# Patient Record
Sex: Female | Born: 1935 | Race: White | Hispanic: No | Marital: Married | Smoking: Never smoker
Health system: Southern US, Community
[De-identification: ages and names within clinical notes are randomized; demographics above are authoritative.]

## PROBLEM LIST (undated history)

## (undated) DIAGNOSIS — N950 Postmenopausal bleeding: Secondary | ICD-10-CM

## (undated) DIAGNOSIS — K5730 Diverticulosis of large intestine without perforation or abscess without bleeding: Secondary | ICD-10-CM

## (undated) DIAGNOSIS — D72119 Hypereosinophilic syndrome (hes), unspecified: Secondary | ICD-10-CM

## (undated) DIAGNOSIS — K529 Noninfective gastroenteritis and colitis, unspecified: Secondary | ICD-10-CM

## (undated) DIAGNOSIS — Z974 Presence of external hearing-aid: Secondary | ICD-10-CM

## (undated) DIAGNOSIS — Z853 Personal history of malignant neoplasm of breast: Secondary | ICD-10-CM

## (undated) DIAGNOSIS — C189 Malignant neoplasm of colon, unspecified: Secondary | ICD-10-CM

## (undated) DIAGNOSIS — K219 Gastro-esophageal reflux disease without esophagitis: Secondary | ICD-10-CM

## (undated) DIAGNOSIS — M810 Age-related osteoporosis without current pathological fracture: Secondary | ICD-10-CM

## (undated) DIAGNOSIS — D721 Eosinophilia: Secondary | ICD-10-CM

## (undated) HISTORY — DX: Personal history of malignant neoplasm of breast: Z85.3

## (undated) HISTORY — DX: Age-related osteoporosis without current pathological fracture: M81.0

## (undated) HISTORY — DX: Gastro-esophageal reflux disease without esophagitis: K21.9

## (undated) HISTORY — PX: CATARACT EXTRACTION W/ INTRAOCULAR LENS  IMPLANT, BILATERAL: SHX1307

## (undated) HISTORY — DX: Noninfective gastroenteritis and colitis, unspecified: K52.9

---
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
## 2001-11-09 ENCOUNTER — Encounter: Admission: RE | Admit: 2001-11-09 | Discharge: 2001-11-09 | Payer: Self-pay | Admitting: Internal Medicine

## 2002-10-30 ENCOUNTER — Other Ambulatory Visit: Admission: RE | Admit: 2002-10-30 | Discharge: 2002-10-30 | Payer: Self-pay | Admitting: Internal Medicine

## 2002-12-12 ENCOUNTER — Encounter: Admission: RE | Admit: 2002-12-12 | Discharge: 2002-12-12 | Payer: Self-pay | Admitting: Internal Medicine

## 2003-06-06 ENCOUNTER — Ambulatory Visit (HOSPITAL_COMMUNITY): Admission: RE | Admit: 2003-06-06 | Discharge: 2003-06-06 | Payer: Self-pay | Admitting: Gastroenterology

## 2004-02-20 ENCOUNTER — Ambulatory Visit (HOSPITAL_COMMUNITY): Admission: RE | Admit: 2004-02-20 | Discharge: 2004-02-20 | Payer: Self-pay | Admitting: Internal Medicine

## 2005-03-02 ENCOUNTER — Ambulatory Visit (HOSPITAL_COMMUNITY): Admission: RE | Admit: 2005-03-02 | Discharge: 2005-03-02 | Payer: Self-pay | Admitting: Internal Medicine

## 2006-03-16 ENCOUNTER — Other Ambulatory Visit: Admission: RE | Admit: 2006-03-16 | Discharge: 2006-03-16 | Payer: Self-pay | Admitting: Internal Medicine

## 2006-03-31 ENCOUNTER — Ambulatory Visit (HOSPITAL_COMMUNITY): Admission: RE | Admit: 2006-03-31 | Discharge: 2006-03-31 | Payer: Self-pay | Admitting: Internal Medicine

## 2007-04-13 ENCOUNTER — Ambulatory Visit (HOSPITAL_COMMUNITY): Admission: RE | Admit: 2007-04-13 | Discharge: 2007-04-13 | Payer: Self-pay | Admitting: Internal Medicine

## 2008-02-21 ENCOUNTER — Encounter: Admission: RE | Admit: 2008-02-21 | Discharge: 2008-02-21 | Payer: Self-pay | Admitting: Internal Medicine

## 2008-05-09 ENCOUNTER — Ambulatory Visit (HOSPITAL_COMMUNITY): Admission: RE | Admit: 2008-05-09 | Discharge: 2008-05-09 | Payer: Self-pay | Admitting: Internal Medicine

## 2008-08-07 ENCOUNTER — Encounter (INDEPENDENT_AMBULATORY_CARE_PROVIDER_SITE_OTHER): Payer: Self-pay | Admitting: *Deleted

## 2008-08-07 ENCOUNTER — Ambulatory Visit (HOSPITAL_COMMUNITY): Admission: RE | Admit: 2008-08-07 | Discharge: 2008-08-07 | Payer: Self-pay | Admitting: *Deleted

## 2009-05-15 ENCOUNTER — Ambulatory Visit (HOSPITAL_COMMUNITY): Admission: RE | Admit: 2009-05-15 | Discharge: 2009-05-15 | Payer: Self-pay | Admitting: Internal Medicine

## 2009-05-23 ENCOUNTER — Encounter: Admission: RE | Admit: 2009-05-23 | Discharge: 2009-05-23 | Payer: Self-pay | Admitting: Internal Medicine

## 2009-05-26 ENCOUNTER — Inpatient Hospital Stay (HOSPITAL_COMMUNITY): Admission: EM | Admit: 2009-05-26 | Discharge: 2009-05-27 | Payer: Self-pay | Admitting: Emergency Medicine

## 2009-05-26 ENCOUNTER — Encounter (INDEPENDENT_AMBULATORY_CARE_PROVIDER_SITE_OTHER): Payer: Self-pay | Admitting: Surgery

## 2009-05-26 HISTORY — PX: LAPAROSCOPIC CHOLECYSTECTOMY: SUR755

## 2009-06-20 ENCOUNTER — Ambulatory Visit: Admission: RE | Admit: 2009-06-20 | Discharge: 2009-06-20 | Payer: Self-pay | Admitting: Internal Medicine

## 2009-11-20 ENCOUNTER — Encounter: Admission: RE | Admit: 2009-11-20 | Discharge: 2009-11-20 | Payer: Self-pay | Admitting: Internal Medicine

## 2009-11-25 ENCOUNTER — Encounter: Admission: RE | Admit: 2009-11-25 | Discharge: 2009-11-25 | Payer: Self-pay | Admitting: Internal Medicine

## 2009-11-26 ENCOUNTER — Encounter: Admission: RE | Admit: 2009-11-26 | Discharge: 2009-11-26 | Payer: Self-pay | Admitting: Internal Medicine

## 2009-11-28 ENCOUNTER — Encounter: Admission: RE | Admit: 2009-11-28 | Discharge: 2009-11-28 | Payer: Self-pay | Admitting: Internal Medicine

## 2009-12-24 ENCOUNTER — Encounter: Admission: RE | Admit: 2009-12-24 | Discharge: 2009-12-24 | Payer: Self-pay | Admitting: Surgery

## 2009-12-24 ENCOUNTER — Ambulatory Visit (HOSPITAL_COMMUNITY): Admission: RE | Admit: 2009-12-24 | Discharge: 2009-12-24 | Payer: Self-pay | Admitting: Surgery

## 2009-12-24 HISTORY — PX: BREAST LUMPECTOMY: SHX2

## 2010-03-24 ENCOUNTER — Ambulatory Visit: Payer: Medicare Other | Attending: Radiation Oncology | Admitting: Radiation Oncology

## 2010-03-24 DIAGNOSIS — Z79899 Other long term (current) drug therapy: Secondary | ICD-10-CM | POA: Insufficient documentation

## 2010-03-24 DIAGNOSIS — D0590 Unspecified type of carcinoma in situ of unspecified breast: Secondary | ICD-10-CM | POA: Insufficient documentation

## 2010-03-25 ENCOUNTER — Encounter (HOSPITAL_BASED_OUTPATIENT_CLINIC_OR_DEPARTMENT_OTHER): Payer: Medicare Other | Admitting: Oncology

## 2010-03-25 ENCOUNTER — Encounter: Payer: Self-pay | Admitting: Oncology

## 2010-03-25 DIAGNOSIS — D0590 Unspecified type of carcinoma in situ of unspecified breast: Secondary | ICD-10-CM

## 2010-04-09 LAB — CBC
HCT: 41.2 % (ref 36.0–46.0)
MCH: 28.2 pg (ref 26.0–34.0)
MCHC: 33.3 g/dL (ref 30.0–36.0)
MCV: 84.9 fL (ref 78.0–100.0)
Platelets: 88 10*3/uL — ABNORMAL LOW (ref 150–400)
RDW: 14.8 % (ref 11.5–15.5)
WBC: 6 10*3/uL (ref 4.0–10.5)

## 2010-04-09 LAB — COMPREHENSIVE METABOLIC PANEL
Albumin: 4.3 g/dL (ref 3.5–5.2)
BUN: 11 mg/dL (ref 6–23)
Calcium: 9.9 mg/dL (ref 8.4–10.5)
Creatinine, Ser: 0.78 mg/dL (ref 0.4–1.2)
Glucose, Bld: 116 mg/dL — ABNORMAL HIGH (ref 70–99)
Total Protein: 7.2 g/dL (ref 6.0–8.3)

## 2010-04-09 LAB — DIFFERENTIAL
Basophils Relative: 0 % (ref 0–1)
Eosinophils Absolute: 0.1 10*3/uL (ref 0.0–0.7)
Lymphocytes Relative: 27 % (ref 12–46)
Monocytes Relative: 11 % (ref 3–12)
Neutro Abs: 3.6 10*3/uL (ref 1.7–7.7)
Neutrophils Relative %: 61 % (ref 43–77)

## 2010-04-15 LAB — LIPASE, BLOOD: Lipase: 33 U/L (ref 11–59)

## 2010-04-15 LAB — CBC
HCT: 41.4 % (ref 36.0–46.0)
Hemoglobin: 13.9 g/dL (ref 12.0–15.0)
MCHC: 33.5 g/dL (ref 30.0–36.0)
RBC: 4.71 MIL/uL (ref 3.87–5.11)
RDW: 14.7 % (ref 11.5–15.5)

## 2010-04-15 LAB — DIFFERENTIAL
Basophils Absolute: 0 10*3/uL (ref 0.0–0.1)
Eosinophils Absolute: 0 10*3/uL (ref 0.0–0.7)
Lymphs Abs: 1.6 10*3/uL (ref 0.7–4.0)
Monocytes Absolute: 0.6 10*3/uL (ref 0.1–1.0)
Neutrophils Relative %: 78 % — ABNORMAL HIGH (ref 43–77)

## 2010-04-15 LAB — COMPREHENSIVE METABOLIC PANEL
ALT: 19 U/L (ref 0–35)
Alkaline Phosphatase: 53 U/L (ref 39–117)
BUN: 17 mg/dL (ref 6–23)
CO2: 27 mEq/L (ref 19–32)
Calcium: 10 mg/dL (ref 8.4–10.5)
GFR calc non Af Amer: >60 mL/min (ref >60)
Glucose, Bld: 141 mg/dL — ABNORMAL HIGH (ref 70–99)
Potassium: 3.5 mEq/L (ref 3.5–5.1)
Sodium: 140 mEq/L (ref 135–145)
Total Protein: 7.3 g/dL (ref 6.0–8.3)

## 2010-04-15 LAB — URINE MICROSCOPIC-ADD ON

## 2010-04-15 LAB — URINALYSIS, ROUTINE W REFLEX MICROSCOPIC
Bilirubin Urine: NEGATIVE
Ketones, ur: NEGATIVE mg/dL
Nitrite: NEGATIVE
Protein, ur: NEGATIVE mg/dL
Urobilinogen, UA: 0.2 mg/dL (ref 0.0–1.0)

## 2010-04-25 ENCOUNTER — Other Ambulatory Visit: Payer: Self-pay | Admitting: Dermatology

## 2010-05-27 ENCOUNTER — Other Ambulatory Visit: Payer: Self-pay | Admitting: Oncology

## 2010-05-27 ENCOUNTER — Encounter (HOSPITAL_BASED_OUTPATIENT_CLINIC_OR_DEPARTMENT_OTHER): Payer: Medicare Other | Admitting: Oncology

## 2010-05-27 DIAGNOSIS — D0590 Unspecified type of carcinoma in situ of unspecified breast: Secondary | ICD-10-CM

## 2010-05-27 LAB — CBC WITH DIFFERENTIAL/PLATELET
Basophils Absolute: 0 10*3/uL (ref 0.0–0.1)
Eosinophils Absolute: 0 10*3/uL (ref 0.0–0.5)
HCT: 39.6 % (ref 34.8–46.6)
HGB: 13.5 g/dL (ref 11.6–15.9)
LYMPH%: 36.3 % (ref 14.0–49.7)
MONO#: 0.7 10*3/uL (ref 0.1–0.9)
NEUT#: 3 10*3/uL (ref 1.5–6.5)
Platelets: 93 10*3/uL — ABNORMAL LOW (ref 145–400)
RBC: 4.74 10*6/uL (ref 3.70–5.45)
WBC: 5.9 10*3/uL (ref 3.9–10.3)

## 2010-05-28 LAB — VITAMIN D 25 HYDROXY (VIT D DEFICIENCY, FRACTURES): Vit D, 25-Hydroxy: 61 ng/mL (ref 30–89)

## 2010-05-28 LAB — COMPREHENSIVE METABOLIC PANEL
ALT: 20 U/L (ref 0–35)
Alkaline Phosphatase: 59 U/L (ref 39–117)
CO2: 24 mEq/L (ref 19–32)
Creatinine, Ser: 0.82 mg/dL (ref 0.40–1.20)
Total Bilirubin: 0.5 mg/dL (ref 0.3–1.2)

## 2010-06-10 NOTE — Op Note (Signed)
NAME:  April [name], April [name] NO.:  [id number]   MEDICAL RECORD NO.:  [id number]          PATIENT TYPE:  AMB   LOCATION:  ENDO                         FACILITY:  [facility]   PHYSICIAN:  Georgiana Spinner, M.D.    DATE OF BIRTH:  02-11-1925   DATE OF PROCEDURE:  08/07/2008  DATE OF DISCHARGE:                               OPERATIVE REPORT   PROCEDURE:  Colonoscopy with polypectomy and biopsy.   INDICATIONS:  Colon polyps.   ANESTHESIA:  Fentanyl 75 mcg, Versed 7 mg.   PROCEDURE:  With the patient mildly sedated in the left lateral  decubitus position, the Pentax videoscopic pediatric colonoscope was  inserted the rectum and passed under direct vision with pressure  applied.  We reached the cecum identified by ileocecal valve and  appendiceal orifice, both of which were photographed.  From this point,  the colonoscope was slowly withdrawn taking circumferential views of  colonic mucosa, stopping at 40 cm from anal verge at which point we  encountered 2 polyps.  Both were photographed.  Both were removed using  snare cautery technique setting of 20/150 blended current.  Photographs  were taken post polypectomy of both sites which were clean.  Both polyps  were retrieved by suctioning through the endoscope into a tissue trap.  We then further stopped to photograph diverticula seen in the sigmoid  colon and in the rectum where a polyp was seen, photographed and removed  using hot biopsy forceps technique set with the same setting of 20/150  blended current.  The rectum otherwise appeared normal on direct and  showed hemorrhoids on retroflexed view.  The endoscope was straightened  and withdrawn.  The patient's vital signs and pulse oximeter remained  stable.  The patient tolerated the procedure well without apparent  complication.   FINDINGS:  Polyps at 40 cm from anal verge, some diverticulosis of  sigmoid colon and a rectal polyp removed.  Internal hemorrhoids were  noted as  well.   PLAN:  Await biopsy report.  The patient will call me for results and  follow-up with me as an outpatient.           ______________________________  Georgiana Spinner, M.D.     GMO/MEDQ  D:  08/07/2008  T:  08/07/2008  Job:  [id number]

## 2010-06-13 NOTE — Op Note (Signed)
NAME:  April [name], April [name]                          ACCOUNT NO.:  [id number]   MEDICAL RECORD NO.:  [id number]                   PATIENT TYPE:  AMB   LOCATION:  ENDO                                 FACILITY:  [facility]   PHYSICIAN:  Anselmo Rod, M.D.               DATE OF BIRTH:  Jun 11, 1925   DATE OF PROCEDURE:  06/06/2003  DATE OF DISCHARGE:                                 OPERATIVE REPORT   PROCEDURE PERFORMED:  Colonoscopy.   ENDOSCOPIST:  Charna Elizabeth, M.D.   INSTRUMENT USED:  Olympus video colonoscope.   INDICATIONS FOR PROCEDURE:  The patient is a 77 year old white female with a  history of thrombocytopenia and colonic polyps, undergoing screening  colonoscopy. Rule out recurrent colonic polyps.   PREPROCEDURE PREPARATION:  Informed consent was procured from the patient.  The patient was fasted for eight hours prior to the procedure and prepped  with a bottle of magnesium citrate and a gallon of GoLYTELY the night prior  to the procedure.   PREPROCEDURE PHYSICAL:  The patient had stable vital signs.  Neck supple.  Chest clear to auscultation.  S1 and S2 regular.  Abdomen soft with normal  bowel sounds.   DESCRIPTION OF PROCEDURE:  The patient was placed in left lateral decubitus  position and sedated with 50 mg of Demerol and 5 mg of Versed intravenously.  Once the patient was adequately sedated and maintained on low flow oxygen  and continuous cardiac monitoring, the Olympus video colonoscope was  advanced from the rectum to the cecum and terminal ileum without difficulty.  The patient had a fairly good prep.  The appendicular orifice and ileocecal  valve were clearly visualized and photographed.  The terminal ileum appeared  healthy and without lesions.  There were a few scattered diverticula seen in  the sigmoid colon, the rest of the exam was normal.  Retroflexion in the  rectum revealed no abnormalities.  The patient tolerated the procedure well  without immediate  complications.   IMPRESSION:  Normal colonoscopy up to the terminal ileum except for a few  easily sigmoid diverticula.   RECOMMENDATIONS:  1. Repeat colorectal cancer screening is recommended in the next five years     considering her personal history of colonic polyps.  2. Continue high fiber diet with liberal fluid intake.  3. Brochures on a high fiber diet and diverticula have been handed to the     patient for her education.  4. Outpatient followup as need arises in the future.                                               Anselmo Rod, M.D.    JNM/MEDQ  D:  06/06/2003  T:  06/06/2003  Job:  [id number]   cc:  Juline Patch, M.D.  [address]  [place], [place] [address]  Fax: [phone]

## 2010-06-25 ENCOUNTER — Other Ambulatory Visit: Payer: Self-pay | Admitting: Dermatology

## 2010-07-09 ENCOUNTER — Other Ambulatory Visit: Payer: Self-pay | Admitting: Oncology

## 2010-07-09 ENCOUNTER — Encounter (HOSPITAL_BASED_OUTPATIENT_CLINIC_OR_DEPARTMENT_OTHER): Payer: Medicare Other | Admitting: Oncology

## 2010-07-09 DIAGNOSIS — D0590 Unspecified type of carcinoma in situ of unspecified breast: Secondary | ICD-10-CM

## 2010-07-09 DIAGNOSIS — Z170 Estrogen receptor positive status [ER+]: Secondary | ICD-10-CM

## 2010-07-09 DIAGNOSIS — D696 Thrombocytopenia, unspecified: Secondary | ICD-10-CM

## 2010-07-09 LAB — CBC WITH DIFFERENTIAL/PLATELET
Basophils Absolute: 0 10*3/uL (ref 0.0–0.1)
HCT: 40.2 % (ref 34.8–46.6)
HGB: 13.6 g/dL (ref 11.6–15.9)
MCH: 28.3 pg (ref 25.1–34.0)
MONO#: 0.8 10*3/uL (ref 0.1–0.9)
NEUT%: 63.5 % (ref 38.4–76.8)
Platelets: 103 10*3/uL — ABNORMAL LOW (ref 145–400)
lymph#: 1.9 10*3/uL (ref 0.9–3.3)

## 2010-07-09 LAB — COMPREHENSIVE METABOLIC PANEL
BUN: 18 mg/dL (ref 6–23)
CO2: 28 mEq/L (ref 19–32)
Calcium: 9.5 mg/dL (ref 8.4–10.5)
Chloride: 104 mEq/L (ref 96–112)
Creatinine, Ser: 0.75 mg/dL (ref 0.50–1.10)

## 2010-07-10 ENCOUNTER — Other Ambulatory Visit: Payer: Self-pay | Admitting: Dermatology

## 2010-09-26 ENCOUNTER — Other Ambulatory Visit: Payer: Self-pay | Admitting: Internal Medicine

## 2010-09-26 DIAGNOSIS — Z853 Personal history of malignant neoplasm of breast: Secondary | ICD-10-CM

## 2010-09-26 DIAGNOSIS — Z9889 Other specified postprocedural states: Secondary | ICD-10-CM

## 2010-11-24 ENCOUNTER — Ambulatory Visit
Admission: RE | Admit: 2010-11-24 | Discharge: 2010-11-24 | Disposition: A | Discharge status: Home / Self Care | Payer: Medicare Other | Source: Ambulatory Visit | Attending: Internal Medicine | Admitting: Internal Medicine

## 2010-11-24 DIAGNOSIS — Z853 Personal history of malignant neoplasm of breast: Secondary | ICD-10-CM

## 2010-11-24 DIAGNOSIS — Z9889 Other specified postprocedural states: Secondary | ICD-10-CM

## 2010-11-28 ENCOUNTER — Encounter (INDEPENDENT_AMBULATORY_CARE_PROVIDER_SITE_OTHER): Payer: Self-pay | Admitting: General Surgery

## 2010-11-28 ENCOUNTER — Encounter (INDEPENDENT_AMBULATORY_CARE_PROVIDER_SITE_OTHER): Payer: Self-pay | Admitting: Surgery

## 2010-11-28 DIAGNOSIS — Z853 Personal history of malignant neoplasm of breast: Secondary | ICD-10-CM | POA: Insufficient documentation

## 2010-12-05 ENCOUNTER — Encounter (INDEPENDENT_AMBULATORY_CARE_PROVIDER_SITE_OTHER): Payer: Self-pay | Admitting: Surgery

## 2010-12-05 ENCOUNTER — Ambulatory Visit (INDEPENDENT_AMBULATORY_CARE_PROVIDER_SITE_OTHER): Payer: Medicare Other | Admitting: Surgery

## 2010-12-05 VITALS — BP 128/78 | HR 84 | Temp 97.4°F | Resp 16 | Ht 65.0 in | Wt 162.2 lb

## 2010-12-05 DIAGNOSIS — Z853 Personal history of malignant neoplasm of breast: Secondary | ICD-10-CM

## 2010-12-05 NOTE — Progress Notes (Addendum)
NAME: April [name]       DOB: 08/23/1925           DATE: 12/05/2010       MRN: [id number]   April [name] is a 77 y.o.Marland Kitchenfemale who presents for routine followup of her Right breast cancer, DCIS diagnosed in 2011 and treated with Lumpectomy. She has no problems or concerns on either side.  PFSH: She has had no significant changes since the last visit here.  ROS: There have been no significant changes since the last visit here  EXAM: General: The patient is alert, oriented, generally healty appearing, NAD. Mood and affect are normal.  Breasts:  Symmetric - no palpable mass on either side. No suggestion of recurrence or new problem. Normal exam  Lymphatics: She has no axillary or supraclavicular adenopathy on either side.  Extremities: Full ROM of the surgical side with no lymphedema noted.  Data Reviewed: Mammogram last month OK  Impression: Doing well, with no evidence of recurrent cancer or new cancer  Plan: Will continue to follow up on an annual basis here.

## 2010-12-24 ENCOUNTER — Telehealth: Payer: Self-pay | Admitting: Oncology

## 2010-12-24 ENCOUNTER — Other Ambulatory Visit: Payer: Self-pay | Admitting: Oncology

## 2010-12-24 ENCOUNTER — Ambulatory Visit (HOSPITAL_BASED_OUTPATIENT_CLINIC_OR_DEPARTMENT_OTHER): Payer: Medicare Other | Admitting: Oncology

## 2010-12-24 ENCOUNTER — Other Ambulatory Visit (HOSPITAL_BASED_OUTPATIENT_CLINIC_OR_DEPARTMENT_OTHER): Payer: Medicare Other

## 2010-12-24 VITALS — BP 149/74 | HR 77 | Temp 97.9°F | Ht 65.0 in | Wt 163.9 lb

## 2010-12-24 DIAGNOSIS — C50919 Malignant neoplasm of unspecified site of unspecified female breast: Secondary | ICD-10-CM

## 2010-12-24 DIAGNOSIS — D0590 Unspecified type of carcinoma in situ of unspecified breast: Secondary | ICD-10-CM

## 2010-12-24 DIAGNOSIS — Z170 Estrogen receptor positive status [ER+]: Secondary | ICD-10-CM

## 2010-12-24 LAB — CBC WITH DIFFERENTIAL/PLATELET
Basophils Absolute: 0 10*3/uL (ref 0.0–0.1)
EOS%: 0.4 % (ref 0.0–7.0)
Eosinophils Absolute: 0 10*3/uL (ref 0.0–0.5)
HGB: 13.7 g/dL (ref 11.6–15.9)
MONO#: 0.7 10*3/uL (ref 0.1–0.9)
NEUT#: 4.9 10*3/uL (ref 1.5–6.5)
RDW: 15.5 % — ABNORMAL HIGH (ref 11.2–14.5)
WBC: 8 10*3/uL (ref 3.9–10.3)
lymph#: 2.3 10*3/uL (ref 0.9–3.3)

## 2010-12-24 NOTE — Telephone Encounter (Signed)
Gv pt appt for [id number] 

## 2010-12-24 NOTE — Progress Notes (Signed)
OFFICE PROGRESS NOTE  CC: Dr. Cyndia Bent Dr. Royston Cowper, MD, MD [address] [facility] [address] [facility] [place] [address]   DIAGNOSIS: 77 year old female with DCIS status post lumpectomy in November 2011  PRIOR THERAPY:  #1 patient underwent a lumpectomy on 12/24/2009 that revealed a DCIS. The tumor was ER positive PR positive.  #2 patient was then begun on Aromasin 25 mg daily however she was discontinued 9 days later due to the fact that she developed  grade 3 arthralgias and myalgias.  CURRENT THERAPY: Observation  INTERVAL HISTORY: April [name] 77 y.o. female returns for followup visit. Her last visit with me was back in June 2012. Since being off of the Aromasin patient feels much better. Her hands and her feet did not hurt her back does not hurt at this time. She denies any fevers chills night sweats headaches no shortness of breath no chest pains no palpitation she has had a mammogram performed and was normal appearing no new changes. She has also been seen by Dr. Jamey Ripa in followup a few weeks ago.  MEDICAL HISTORY: Past Medical History  Diagnosis Date  . Hypertension   . History of breast cancer 2011    DCIS, Right breast    ALLERGIES:  is allergic to codeine; penicillins; and sulfa antibiotics.  MEDICATIONS:  Current Outpatient Prescriptions  Medication Sig Dispense Refill  . calcium carbonate (OS-CAL) 600 MG TABS Take 600 mg by mouth daily.        . cholecalciferol (VITAMIN D) 1000 UNITS tablet Take 2,000 Units by mouth daily.       [name] exemestane (AROMASIN) 25 MG tablet Take 25 mg by mouth daily after breakfast.        . fish oil-omega-3 fatty acids 1000 MG capsule Take 2 g by mouth daily.        . mometasone (NASONEX) 50 MCG/ACT nasal spray Place 2 sprays into the nose daily.        . solifenacin (VESICARE) 10 MG tablet Take 10 mg by mouth daily.        . vitamin E 400 UNIT capsule Take 400 Units by mouth daily.         . ASTEPRO 0.15 % SOLN         SURGICAL HISTORY:  Past Surgical History  Procedure Date  . Laparoscopic cholecystectomy 05/26/2009  . Breast lumpectomy 12/24/2009    Right, Dr Jamey Ripa    REVIEW OF SYSTEMS:  Constitutional: negative Eyes: negative Ears, nose, mouth, throat, and face: negative Respiratory: negative Cardiovascular: negative Gastrointestinal: negative Genitourinary:negative Musculoskeletal:positive for arthralgias, bone pain and stiff joints Neurological: negative   PHYSICAL EXAMINATION: General appearance: alert, cooperative and appears stated age Head: Normocephalic, without obvious abnormality, atraumatic Neck: no adenopathy, no carotid bruit, no JVD, supple, symmetrical, trachea midline and thyroid not enlarged, symmetric, no tenderness/mass/nodules Lymph nodes: Cervical, supraclavicular, and axillary nodes normal. Resp: clear to auscultation bilaterally and normal percussion bilaterally Back: symmetric, no curvature. ROM normal. No CVA tenderness. Extremities: extremities normal, atraumatic, no cyanosis or edema Neurologic: Alert and oriented X 3, normal strength and tone. Normal symmetric reflexes. Normal coordination and gait Sensory: normal Motor: grossly normal Reflexes: normal 2+ and symmetric Bilateral breast examination is performed. The right breast reveals a very well-healed surgical scar in the outer quadrant of the right breast. There are no nipple discharge no masses no chest skin changes. Left breast no masses nipple discharge or skin changes.  ECOG PERFORMANCE STATUS: 1 - Symptomatic  but completely ambulatory  Blood pressure 149/74, pulse 77, temperature 97.9 F (36.6 C), temperature source Oral, height 5\' 5"  (1.651 m), weight 163 lb 14.4 oz (74.345 kg).  LABORATORY DATA: Lab Results  Component Value Date   WBC 8.0 12/24/2010   HGB 13.7 12/24/2010   HCT 40.2 12/24/2010   MCV 82.2 12/24/2010   PLT 89* 12/24/2010      Chemistry        Component Value Date/Time   NA 140 07/09/2010 1459   K 3.9 07/09/2010 1459   CL 104 07/09/2010 1459   CO2 28 07/09/2010 1459   BUN 18 07/09/2010 1459   CREATININE 0.75 07/09/2010 1459      Component Value Date/Time   CALCIUM 9.5 07/09/2010 1459   ALKPHOS 80 07/09/2010 1459   AST 20 07/09/2010 1459   ALT 17 07/09/2010 1459   BILITOT 0.4 07/09/2010 1459       RADIOGRAPHIC STUDIES:  No results found.  ASSESSMENT: 77 year old female with  #1 DCIS of the right breast status post lumpectomy in November 2011. The tumor was ER/PR positive. Patient did not undergo radiation therapy but was begun on Aromasin 25 mg daily. Unfortunately she could not tolerate the Aromasin. This was therefore discontinued. She has been on observation only and is doing well.   PLAN: We will continue to observe the patient off all therapy. I do think the risks of her therapy far outweigh the benefits that she may potentially have of prevention of another breast cancer. She will continue to have mammograms done on a yearly basis. I will plan on seeing the patient on a yearly basis alternating with Dr. Jamey Ripa. He on his next visit will be seeing her in November 2013. I will see her in June 2013 and then thereafter on a yearly basis.   All questions were answered. The patient knows to call the clinic with any problems, questions or concerns. We can certainly see the patient much sooner if necessary.  I spent 20 minutes counseling the patient face to face. The total time spent in the appointment was 30 minutes.    [name], MD Medical/Oncology [facility] [phone] (beeper) [phone] (Office)  12/24/2010, 3:02 PM

## 2010-12-25 LAB — COMPREHENSIVE METABOLIC PANEL
Albumin: 4.5 g/dL (ref 3.5–5.2)
Alkaline Phosphatase: 79 U/L (ref 39–117)
BUN: 24 mg/dL — ABNORMAL HIGH (ref 6–23)
Creatinine, Ser: 1.16 mg/dL — ABNORMAL HIGH (ref 0.50–1.10)
Glucose, Bld: 115 mg/dL — ABNORMAL HIGH (ref 70–99)
Total Bilirubin: 0.4 mg/dL (ref 0.3–1.2)

## 2010-12-31 ENCOUNTER — Other Ambulatory Visit: Payer: Self-pay | Admitting: Dermatology

## 2011-01-08 ENCOUNTER — Encounter: Payer: Self-pay | Admitting: Oncology

## 2011-07-13 ENCOUNTER — Encounter: Payer: Self-pay | Admitting: Oncology

## 2011-07-13 ENCOUNTER — Ambulatory Visit (HOSPITAL_BASED_OUTPATIENT_CLINIC_OR_DEPARTMENT_OTHER): Payer: Medicare Other | Admitting: Oncology

## 2011-07-13 ENCOUNTER — Telehealth: Payer: Self-pay | Admitting: Oncology

## 2011-07-13 VITALS — BP 121/75 | HR 79 | Temp 98.1°F | Ht 65.0 in | Wt 160.7 lb

## 2011-07-13 DIAGNOSIS — D0590 Unspecified type of carcinoma in situ of unspecified breast: Secondary | ICD-10-CM

## 2011-07-13 DIAGNOSIS — Z853 Personal history of malignant neoplasm of breast: Secondary | ICD-10-CM

## 2011-07-13 DIAGNOSIS — Z170 Estrogen receptor positive status [ER+]: Secondary | ICD-10-CM

## 2011-07-13 NOTE — Patient Instructions (Addendum)
1. I will continue to see you on a yearly basis

## 2011-07-13 NOTE — Progress Notes (Signed)
OFFICE PROGRESS NOTE  CC: Dr. Cyndia Bent Dr. Royston Cowper, MD [address] [place] [address]   DIAGNOSIS: 78 year old female with DCIS status post lumpectomy in November 2011  PRIOR THERAPY:  #1 patient underwent a lumpectomy on 12/24/2009 that revealed a DCIS. The tumor was ER positive PR positive.  #2 patient was then begun on Aromasin 25 mg daily however she was discontinued 9 days later due to the fact that she developed  grade 3 arthralgias and myalgias.  CURRENT THERAPY: Observation  INTERVAL HISTORY: April [name] 78 y.o. female returns for followup visit. Overall she seems to be doing well she is off of all therapy. She could not tolerate the Aromasin that we had started. She denies any myalgias or arthralgias. She is expecting another great granddaughter. She is quite excited about it. She has not noticed any masses in her breasts. She is due for another mammogram in October 2013. Remainder of the 10 point review of systems is negative.[name]  MEDICAL HISTORY: Past Medical History  Diagnosis Date  . Hypertension   . History of breast cancer 2011    DCIS, Right breast    ALLERGIES:  is allergic to codeine; penicillins; and sulfa antibiotics.  MEDICATIONS:  Current Outpatient Prescriptions  Medication Sig Dispense Refill  . ASTEPRO 0.15 % SOLN       . calcium carbonate (OS-CAL) 600 MG TABS Take 600 mg by mouth daily.        . cholecalciferol (VITAMIN D) 1000 UNITS tablet Take 2,000 Units by mouth daily.       . fish oil-omega-3 fatty acids 1000 MG capsule Take 2 g by mouth daily.        . mometasone (NASONEX) 50 MCG/ACT nasal spray Place 2 sprays into the nose daily.        . solifenacin (VESICARE) 10 MG tablet Take 10 mg by mouth daily.        . vitamin E 400 UNIT capsule Take 400 Units by mouth daily.        [name] exemestane (AROMASIN) 25 MG tablet Take 25 mg by mouth daily after breakfast.          SURGICAL HISTORY:  Past  Surgical History  Procedure Date  . Laparoscopic cholecystectomy 05/26/2009  . Breast lumpectomy 12/24/2009    Right, Dr Jamey Ripa    REVIEW OF SYSTEMS:  Constitutional: negative Eyes: negative Ears, nose, mouth, throat, and face: negative Respiratory: negative Cardiovascular: negative Gastrointestinal: negative Genitourinary:negative Musculoskeletal:positive for arthralgias, bone pain and stiff joints Neurological: negative   PHYSICAL EXAMINATION: General appearance: alert, cooperative and appears stated age Head: Normocephalic, without obvious abnormality, atraumatic Neck: no adenopathy, no carotid bruit, no JVD, supple, symmetrical, trachea midline and thyroid not enlarged, symmetric, no tenderness/mass/nodules Lymph nodes: Cervical, supraclavicular, and axillary nodes normal. Resp: clear to auscultation bilaterally and normal percussion bilaterally Back: symmetric, no curvature. ROM normal. No CVA tenderness. Extremities: extremities normal, atraumatic, no cyanosis or edema Neurologic: Alert and oriented X 3, normal strength and tone. Normal symmetric reflexes. Normal coordination and gait Sensory: normal Motor: grossly normal Reflexes: normal 2+ and symmetric Bilateral breast examination is performed. The right breast reveals a very well-healed surgical scar in the outer quadrant of the right breast. There are no nipple discharge no masses no chest skin changes. Left breast no masses nipple discharge or skin changes.  ECOG PERFORMANCE STATUS: 1 - Symptomatic but completely ambulatory  Blood pressure 121/75, pulse 79, temperature 98.1 F (36.7 C),  temperature source Oral, height 5\' 5"  (1.651 m), weight 160 lb 11.2 oz (72.893 kg).  LABORATORY DATA:      RADIOGRAPHIC STUDIES:  No results found.  ASSESSMENT: 78 year old female with  #1 DCIS of the right breast status post lumpectomy in November 2011. The tumor was ER/PR positive. Patient did not undergo radiation therapy  but was begun on Aromasin 25 mg daily. Unfortunately she could not tolerate the Aromasin. This was therefore discontinued. She has been on observation only and is doing well.   PLAN: We will continue to observe the patient off all therapy.And she seems to be doing well there is no clinical evidence of recurrent disease. I will plan on seeing her back in one years time. She is continuing to see Dr. Jamey Ripa as well.  All questions were answered. The patient knows to call the clinic with any problems, questions or concerns. We can certainly see the patient much sooner if necessary.  I spent 20 minutes counseling the patient face to face. The total time spent in the appointment was 30 minutes.    [name], MD Medical/Oncology [facility] [phone] (beeper) [phone] (Office)  07/13/2011, 11:58 AM

## 2011-07-13 NOTE — Telephone Encounter (Signed)
gve the pt her June 2014 appt calendar 

## 2011-10-19 ENCOUNTER — Other Ambulatory Visit: Payer: Self-pay | Admitting: Internal Medicine

## 2011-10-19 DIAGNOSIS — Z9889 Other specified postprocedural states: Secondary | ICD-10-CM

## 2011-10-19 DIAGNOSIS — Z853 Personal history of malignant neoplasm of breast: Secondary | ICD-10-CM

## 2011-11-30 ENCOUNTER — Ambulatory Visit
Admission: RE | Admit: 2011-11-30 | Discharge: 2011-11-30 | Disposition: A | Discharge status: Home / Self Care | Payer: Medicare Other | Source: Ambulatory Visit | Attending: Internal Medicine | Admitting: Internal Medicine

## 2011-11-30 DIAGNOSIS — Z9889 Other specified postprocedural states: Secondary | ICD-10-CM

## 2011-11-30 DIAGNOSIS — Z853 Personal history of malignant neoplasm of breast: Secondary | ICD-10-CM

## 2011-12-02 ENCOUNTER — Encounter (INDEPENDENT_AMBULATORY_CARE_PROVIDER_SITE_OTHER): Payer: Self-pay | Admitting: Surgery

## 2011-12-02 ENCOUNTER — Ambulatory Visit (INDEPENDENT_AMBULATORY_CARE_PROVIDER_SITE_OTHER): Payer: Medicare Other | Admitting: Surgery

## 2011-12-02 VITALS — BP 126/80 | HR 92 | Temp 97.6°F | Resp 18 | Ht 65.0 in | Wt 162.0 lb

## 2011-12-02 DIAGNOSIS — Z853 Personal history of malignant neoplasm of breast: Secondary | ICD-10-CM

## 2011-12-02 NOTE — Patient Instructions (Signed)
Continue annual mammograms and follow ups 

## 2011-12-02 NOTE — Progress Notes (Signed)
NAME: April [name]       DOB: 1925-10-10           DATE: 12/02/2011       MRN: [id number]   April [name] is a 78 y.o.Marland Kitchenfemale who presents for routine followup of her Right breast cancer, DCIS diagnosed in 2011 and treated with Lumpectomy . She has no problems or concerns on either side.  PFSH: She has had no significant changes since the last visit here.  ROS: There have been no significant changes since the last visit here  EXAM: General: The patient is alert, oriented, generally healty appearing, NAD. Mood and affect are normal.  Breasts:  Symmetric - no palpable mass on either side. No suggestion of recurrence or new problem. Normal exam  Lymphatics: She has no axillary or supraclavicular adenopathy on either side.  Extremities: Full ROM of the surgical side with no lymphedema noted.  Data Reviewed: Mammogram last week: IMPRESSION:  No mammographic evidence of malignancy.  RECOMMENDATION:  Yearly diagnostic mammography is suggested.  I have discussed the findings and recommendations with the patient.  Results were also provided in writing at the conclusion of the  visit.  BI-RADS CATEGORY 2: Benign finding(s).  Original Report Authenticated By: Cain Saupe, M.D.   Impression: Doing well, with no evidence of recurrent cancer or new cancer  Plan: Will continue to follow up on an annual basis here.

## 2012-07-13 ENCOUNTER — Telehealth: Payer: Self-pay | Admitting: Oncology

## 2012-07-13 NOTE — Telephone Encounter (Signed)
, °

## 2012-07-17 ENCOUNTER — Emergency Department (HOSPITAL_COMMUNITY): Payer: Medicare Other

## 2012-07-17 ENCOUNTER — Emergency Department (HOSPITAL_COMMUNITY)
Admission: EM | Admit: 2012-07-17 | Discharge: 2012-07-17 | Disposition: A | Discharge status: Skilled Nursing Facility | Payer: Medicare Other | Attending: Emergency Medicine | Admitting: Emergency Medicine

## 2012-07-17 ENCOUNTER — Encounter (HOSPITAL_COMMUNITY): Payer: Self-pay | Admitting: Emergency Medicine

## 2012-07-17 DIAGNOSIS — M549 Dorsalgia, unspecified: Secondary | ICD-10-CM

## 2012-07-17 DIAGNOSIS — I10 Essential (primary) hypertension: Secondary | ICD-10-CM | POA: Insufficient documentation

## 2012-07-17 DIAGNOSIS — IMO0002 Reserved for concepts with insufficient information to code with codable children: Secondary | ICD-10-CM | POA: Insufficient documentation

## 2012-07-17 DIAGNOSIS — S32009A Unspecified fracture of unspecified lumbar vertebra, initial encounter for closed fracture: Secondary | ICD-10-CM | POA: Insufficient documentation

## 2012-07-17 DIAGNOSIS — M4850XA Collapsed vertebra, not elsewhere classified, site unspecified, initial encounter for fracture: Secondary | ICD-10-CM | POA: Insufficient documentation

## 2012-07-17 DIAGNOSIS — Z853 Personal history of malignant neoplasm of breast: Secondary | ICD-10-CM | POA: Insufficient documentation

## 2012-07-17 DIAGNOSIS — M4856XA Collapsed vertebra, not elsewhere classified, lumbar region, initial encounter for fracture: Secondary | ICD-10-CM

## 2012-07-17 DIAGNOSIS — Y9389 Activity, other specified: Secondary | ICD-10-CM | POA: Insufficient documentation

## 2012-07-17 DIAGNOSIS — Y9210 Unspecified residential institution as the place of occurrence of the external cause: Secondary | ICD-10-CM | POA: Insufficient documentation

## 2012-07-17 LAB — POCT I-STAT, CHEM 8
Calcium, Ion: 1.14 mmol/L (ref 1.13–1.30)
Chloride: 101 mEq/L (ref 96–112)
Glucose, Bld: 98 mg/dL (ref 70–99)
HCT: 37 % (ref 36.0–46.0)
Hemoglobin: 12.6 g/dL (ref 12.0–15.0)
TCO2: 24 mmol/L (ref 0–100)

## 2012-07-17 LAB — URINALYSIS, ROUTINE W REFLEX MICROSCOPIC
Ketones, ur: 40 mg/dL — AB
Nitrite: NEGATIVE
Protein, ur: NEGATIVE mg/dL
pH: 5.5 (ref 5.0–8.0)

## 2012-07-17 LAB — CG4 I-STAT (LACTIC ACID): Lactic Acid, Venous: 1.15 mmol/L (ref 0.5–2.2)

## 2012-07-17 LAB — URINE MICROSCOPIC-ADD ON

## 2012-07-17 MED ORDER — FENTANYL CITRATE 0.05 MG/ML IJ SOLN
50.0000 ug | Freq: Once | INTRAMUSCULAR | Status: AC
  Administered 2012-07-17: 50 ug via INTRAVENOUS
  Filled 2012-07-17: qty 2

## 2012-07-17 MED ORDER — KETOROLAC TROMETHAMINE 30 MG/ML IJ SOLN
30.0000 mg | Freq: Once | INTRAMUSCULAR | Status: AC
  Administered 2012-07-17: 30 mg via INTRAVENOUS
  Filled 2012-07-17: qty 1

## 2012-07-17 MED ORDER — HYDROMORPHONE HCL PF 1 MG/ML IJ SOLN
1.0000 mg | Freq: Once | INTRAMUSCULAR | Status: AC
  Administered 2012-07-17: 1 mg via INTRAVENOUS
  Filled 2012-07-17: qty 1

## 2012-07-17 MED ORDER — ONDANSETRON HCL 4 MG/2ML IJ SOLN
4.0000 mg | Freq: Once | INTRAMUSCULAR | Status: AC
  Administered 2012-07-17: 4 mg via INTRAVENOUS
  Filled 2012-07-17: qty 2

## 2012-07-17 MED ORDER — FENTANYL CITRATE 0.05 MG/ML IJ SOLN
25.0000 ug | Freq: Once | INTRAMUSCULAR | Status: AC
  Administered 2012-07-17: 25 ug via INTRAVENOUS
  Filled 2012-07-17: qty 2

## 2012-07-17 MED ORDER — OXYCODONE-ACETAMINOPHEN 5-325 MG PO TABS: 1.0000 | ORAL_TABLET | Freq: Four times a day (QID) | ORAL | Status: DC | PRN

## 2012-07-17 NOTE — ED Notes (Signed)
Spoke with Dawn at [facility] about pt's return.

## 2012-07-17 NOTE — ED Notes (Signed)
[employer] called for lumbar corset placement.

## 2012-07-17 NOTE — ED Notes (Signed)
MD at bedside. 

## 2012-07-17 NOTE — ED Provider Notes (Signed)
6:47 PM MRI results reviewed, pt has acute versus subacute compression fracture of L1.  She states pain is much improved after meds, but now are beginning to wear off.  Another dose of IV meds given.  Discussed all results with patient and husband at bedside.  Will give neurology referral and rx for stronger pain medication.  They have multiple concerns including where to get rx filled, how to make appointment for followup with neurosurgery.  I have tried to answer all questions to the best of my ability.  They have called [facility] for a ride home and are trying to arrange for her to go to rehab from the assisted living section.    April Chick, MD 07/17/12 [phone]

## 2012-07-17 NOTE — ED Notes (Signed)
Pt ambulatory with 1 assist 

## 2012-07-17 NOTE — ED Notes (Signed)
Spoke with [employer]; on the way to apply lumbar corsett at this time.

## 2012-07-17 NOTE — ED Provider Notes (Signed)
History     CSN: [id number]  Arrival date & time 07/17/12  1108   First MD Initiated Contact with Patient 07/17/12 1111      Chief Complaint  Patient presents with  . Hip Pain     HPI PT from [facility]. Pt moving boxes 3 weeks ago and felt she pulled muscle. Pt has seen chiropracter and has had acupuncture, felt better until yesterday when pain started back up again. Pt took tramadol last night with no relief.  Past Medical History  Diagnosis Date  . Hypertension   . History of breast cancer 2011    DCIS, Right breast    Past Surgical History  Procedure Laterality Date  . Laparoscopic cholecystectomy  05/26/2009  . Breast lumpectomy  12/24/2009    Right, Dr Jamey Ripa    Family History  Problem Relation Age of Onset  . Heart disease Mother   . Heart disease Father   . Diabetes Father   . Cancer Paternal Aunt     breast    History  Substance Use Topics  . Smoking status: Never Smoker   . Smokeless tobacco: Never Used  . Alcohol Use: Yes    OB History   Grav Para Term Preterm Abortions TAB SAB Ect Mult Living                  Review of Systems All other systems reviewed and are negative Allergies  Codeine; Penicillins; and Sulfa antibiotics  Home Medications   Current Outpatient Rx  Name  Route  Sig  Dispense  Refill  . acetaminophen (TYLENOL) 500 MG tablet   Oral   Take 500 mg by mouth every 6 (six) hours as needed for pain (pain).         . calcium carbonate (OS-CAL) 600 MG TABS   Oral   Take 600 mg by mouth daily.           . cholecalciferol (VITAMIN D) 1000 UNITS tablet   Oral   Take 2,000 Units by mouth daily.          . cholecalciferol (VITAMIN D) 1000 UNITS tablet   Oral   Take 2,000 Units by mouth daily.         . mometasone (NASONEX) 50 MCG/ACT nasal spray   Nasal   Place 2 sprays into the nose daily.           . Multiple Vitamins-Minerals (MULTIVITAMIN WITH MINERALS) tablet   Oral   Take 1 tablet by  mouth daily.         . vitamin E 400 UNIT capsule   Oral   Take 400 Units by mouth daily.           [name] oxyCODONE-acetaminophen (PERCOCET/ROXICET) 5-325 MG per tablet   Oral   Take 1-2 tablets by mouth every 6 (six) hours as needed for pain.   30 tablet   0     BP 113/48  Pulse 83  Temp(Src) 97.8 F (36.6 C) (Oral)  Resp 12  Ht 5\' 5"  (1.651 m)  Wt 158 lb (71.668 kg)  BMI 26.29 kg/m2  SpO2 96%  Physical Exam  Nursing note and vitals reviewed. Constitutional: She is oriented to person, place, and time. She appears well-developed and well-nourished. She appears distressed (Because of pain).  HENT:  Head: Normocephalic and atraumatic.  Eyes: Pupils are equal, round, and reactive to light.  Neck: Normal range of motion.  Cardiovascular: Normal rate and intact distal  pulses.   Pulmonary/Chest: No respiratory distress.  Abdominal: Normal appearance. She exhibits no distension.  Musculoskeletal:       Left hip: She exhibits decreased range of motion.       Back:       Legs: Pain where indicated  Neurological: She is alert and oriented to person, place, and time. No cranial nerve deficit. GCS eye subscore is 4. GCS verbal subscore is 5. GCS motor subscore is 6.  Unable to test gait because of severe pain with any movement of the low back and hip area  Skin: Skin is warm and dry. No rash noted.  Psychiatric: She has a normal mood and affect. Her behavior is normal.    ED Course  Procedures (including critical care time) Medications  ondansetron (ZOFRAN) injection 4 mg (4 mg Intravenous Given 07/17/12 1151)  fentaNYL (SUBLIMAZE) injection 50 mcg (50 mcg Intravenous Given 07/17/12 1151)  ketorolac (TORADOL) 30 MG/ML injection 30 mg (30 mg Intravenous Given 07/17/12 1235)  fentaNYL (SUBLIMAZE) injection 25 mcg (25 mcg Intravenous Given 07/17/12 1309)  fentaNYL (SUBLIMAZE) injection 25 mcg (25 mcg Intravenous Given 07/17/12 1405)  HYDROmorphone (DILAUDID) injection 1 mg (1 mg  Intravenous Given 07/17/12 2111)   After treatment with pain meds through the IV, patient has had minimal relief. Labs Reviewed  URINALYSIS, ROUTINE W REFLEX MICROSCOPIC - Abnormal; Notable for the following:    Color, Urine AMBER (*)    APPearance CLOUDY (*)    Bilirubin Urine SMALL (*)    Ketones, ur 40 (*)    Leukocytes, UA TRACE (*)    All other components within normal limits  URINE MICROSCOPIC-ADD ON - Abnormal; Notable for the following:    Squamous Epithelial / LPF MANY (*)    Bacteria, UA FEW (*)    All other components within normal limits  CG4 I-STAT (LACTIC ACID)  POCT I-STAT, CHEM 8   Dg Lumbar Spine Complete  07/17/2012   *RADIOLOGY REPORT*  Clinical Data: Back pain and left hip pain.  LUMBAR SPINE - COMPLETE 4+ VIEW  Comparison: None.  Findings: There is mild rightward convex scoliosis of the lumbar spine.  Mild spondylosis present consisting primarily of facet hypertrophy at multiple levels.  The vertebral bodies are osteopenic.  There likely is chronic mild loss of height of the L1 and L4 vertebral bodies.  No spondylolisthesis is identified.  No bony lesions are seen.  IMPRESSION: Mild scoliosis and degenerative changes of the lumbar spine.  Mild loss of height of the L1 and L4 vertebral bodies.   Original Report Authenticated By: Irish Lack, M.D.   Dg Pelvis 1-2 Views  07/17/2012   *RADIOLOGY REPORT*  Clinical Data: Pain.  PELVIS - 1-2 VIEW  Comparison: None.  Findings: The bony pelvis and both hip joints appear unremarkable. No fracture or bony lesion is identified.  Soft tissues are unremarkable.  IMPRESSION: Normal bony pelvis.   Original Report Authenticated By: Irish Lack, M.D.   Mr Lumbar Spine Wo Contrast  07/17/2012   *RADIOLOGY REPORT*  Clinical Data: Low back pain.  Severe left hip pain.  Difficulty walking.  History of breast cancer.  MRI LUMBAR SPINE WITHOUT CONTRAST  Technique:  Multiplanar and multiecho pulse sequences of the lumbar spine were obtained  without intravenous contrast.  Comparison: Plain films 07/17/2012  Findings: Transitional anatomy.  S1-S2 interspace fairly well developed.  There is an acute to subacute compression deformity of the L1 vertebral body with involvement of the inferior endplate. The lower one  half to to thirds of the vertebral body displays bone marrow edema consistent with a benign osteopenic compression fracture. There is slight loss of vertebral body height without significant retropulsion.  There is no epidural hematoma.  The adjacent conus is normal in size and signal without compression or edema.  Slight biconvex appearance to the L2, L3, and L4 vertebrae without evidence for bone marrow edema.  Small Schmorl's node projects inferiorly from the L4-5 interspace with minimal reactive change. No signs of metastatic disease.  No paravertebral masses.  Mild atheromatous change of the abdominal aorta.  The individual disc spaces are examined as follows:  L1-2:  Mild facet arthropathy.  No significant retropulsion.  No significant disc protrusion and annular bulging.  Slight left foraminal narrowing without impingement.  L2-3:  Mild bulge.  Mild facet arthropathy.  No impingement.  L3-4:  Mild bulge.  Mild facet and ligamentum flavum hypertrophy. Asymmetric lateral recess encroachment on the left.  Slight asymmetric neural foraminal narrowing.  Either could affect the left sided nerve roots.  L4-5:  1 mm facet mediated anterolisthesis with mild annular bulging.  Facet and ligament flavum hypertrophy.  Mild central canal stenosis without clear-cut L4 or L5 nerve root impingement.  L5-S1:  Mild bulge.  Mild facet arthropathy.  No impingement.  IMPRESSION: Acute or subacute L1 benign osteopenic compression fracture with bone marrow edema affecting the inferior aspect of this vertebral body. No signs of pathologic involvement due to metastatic breast cancer.  No significant retropulsion or associated disc protrusion. Mild left-sided neural  foraminal narrowing at L1-L2 without clear- cut L1 or L2 nerve root impingement.  Ordinary age related lumbar spondylosis at other levels as described.   Original Report Authenticated By: Davonna Belling, M.D.   Mr Hip Left Wo Contrast  07/17/2012   *RADIOLOGY REPORT*  Clinical Data: Severe low back pain and left hip pain.  MRI OF THE LEFT HIP WITHOUT CONTRAST  Technique:  Multiplanar, multisequence MR imaging was performed. No intravenous contrast was administered.  Comparison: Radiograph dated 07/17/2012  Findings: There are minimal arthritic changes of the left hip. There is a tiny bone infarct in the posterior aspect of the right femoral head.  The soft tissues around the left hip are normal.  The visualized pelvic bones are normal.  There is slight edema in the posterior paraspinal musculature in the lower lumbar spine adjacent to facet arthritis.  IMPRESSION: Minimal degenerative changes of the left hip.  No acute abnormalities.   Original Report Authenticated By: Francene Boyers, M.D.     1. Back pain   2. Compression fracture of lumbar spine, non-traumatic, initial encounter       MDM   Patient signed out to Dr. Karma Ganja who will disposition pending MRI       Nelia Shi, MD 07/20/12 1510

## 2012-07-17 NOTE — ED Notes (Signed)
[employer] at bedside to place lumbar corsett

## 2012-07-17 NOTE — ED Notes (Addendum)
PT from [facility].  Pt moving boxes 3 weeks ago and felt she pulled muscle.  Pt has seen chiropracter and has had acupuncture, felt better until yesterday when pain started back up again.  Pt took tramadol last night with no relief.

## 2012-07-17 NOTE — ED Notes (Signed)
Patient transported to MRI 

## 2012-07-17 NOTE — ED Notes (Signed)
[facility] called for transport.  

## 2012-07-17 NOTE — ED Notes (Signed)
Lumbar corsett placed by [employer] at bedside.

## 2012-07-17 NOTE — ED Notes (Signed)
NWG:[id number]<ZH> Expected date:07/17/12<BR> Expected time:<BR> Means of arrival:<BR> Comments:<BR> Non-traumatic hip

## 2012-07-18 ENCOUNTER — Other Ambulatory Visit: Payer: Medicare Other | Admitting: Lab

## 2012-07-18 ENCOUNTER — Other Ambulatory Visit: Payer: Self-pay | Admitting: *Deleted

## 2012-07-18 ENCOUNTER — Ambulatory Visit: Payer: Self-pay | Admitting: Oncology

## 2012-07-18 MED ORDER — OXYCODONE-ACETAMINOPHEN 5-325 MG PO TABS: 1.0000 | ORAL_TABLET | Freq: Four times a day (QID) | ORAL | Status: DC | PRN

## 2012-07-25 ENCOUNTER — Non-Acute Institutional Stay (SKILLED_NURSING_FACILITY): Payer: Medicare Other | Admitting: Geriatric Medicine

## 2012-07-25 ENCOUNTER — Encounter: Payer: Self-pay | Admitting: Geriatric Medicine

## 2012-07-25 DIAGNOSIS — IMO0002 Reserved for concepts with insufficient information to code with codable children: Secondary | ICD-10-CM

## 2012-07-25 DIAGNOSIS — M810 Age-related osteoporosis without current pathological fracture: Secondary | ICD-10-CM

## 2012-07-25 NOTE — Progress Notes (Signed)
Patient ID: April [name], female   DOB: August 12, 1925, 79 y.o.   MRN: [id number] [facility] [phone])  Chief Complaint  Patient presents with  . Vertebral compression fracture    HPI: This 79 year old female resident of [facility] was evaluated in the [facility] emergency department on 07/17/2012. This patient had been experiencing significant left hip pain for 2 weeks prior to this evaluation. Patient reported to the nurse she thought she injured herself while moving boxes during her recent move. This pain was treated with medication and acupuncture treatments. ED evaluation included x-rays of the lumbar spine and hips as well as MRI of the same areas. Significant results were noted as acute or subacute vertebral fracture at level of L1.  Pain improved with administration of IV medication. Patient was given prescription for stronger oral pain medication and referral for neurosurgery. Patient was discharged to the [facility] at [facility].  This patient's primary care provider is Dr.Pang, while in the rehabilitation section she's been followed by [facility]. This patient has been working with Physical and Occupational therapies, pain has been well managed with current oral medication. Attempted dose reduction yesterday, "went OK", this afternoon felt she needed to resume previous schedule after increased activity (Home assessment followed by PT session). Patient has made good progress and is now independent with ADLs, is ambulating with minimal discomfort. Occupational therapy home evaluation and modifications are in progress. Patient is scheduled for neurosurgery evaluation tomorrow.  Bathing: Independent Bladder Management: Continent, Bowel Management: Continent Feeding: Independent Hygiene and Grooming: Independent, Lobbyist / Clothing: Independent Transfers: Independent, Walk: Independent  Allergies  Allergies  Allergen  Reactions  . Codeine   . Penicillins   . Sulfa Antibiotics    Medications  Current outpatient prescriptions:calcium carbonate (OS-CAL) 600 MG TABS, Take 600 mg by mouth daily.  , Disp: , Rfl: ;  cholecalciferol (VITAMIN D) 1000 UNITS tablet, Take 2,000 Units by mouth daily. , Disp: , Rfl: ;  cholecalciferol (VITAMIN D) 1000 UNITS tablet, Take 2,000 Units by mouth daily., Disp: , Rfl: ;  ibuprofen (ADVIL,MOTRIN) 200 MG tablet, Take 200 mg by mouth every 6 (six) hours as needed for pain., Disp: , Rfl:  mometasone (NASONEX) 50 MCG/ACT nasal spray, Place 2 sprays into the nose daily.  , Disp: , Rfl: ;  Multiple Vitamins-Minerals (MULTIVITAMIN WITH MINERALS) tablet, Take 1 tablet by mouth daily., Disp: , Rfl: ;  Omega-3 Fatty Acids (FISH OIL) 500 MG CAPS, Take 500 mg by mouth daily., Disp: , Rfl:  oxyCODONE-acetaminophen (PERCOCET/ROXICET) 5-325 MG per tablet, Take 1-2 tablets by mouth every 6 (six) hours as needed for pain., Disp: 240 tablet, Rfl: 0;  vitamin E 400 UNIT capsule, Take 400 Units by mouth daily.  , Disp: , Rfl: ;  acetaminophen (TYLENOL) 500 MG tablet, Take 500 mg by mouth every 6 (six) hours as needed for pain (pain)., Disp: , Rfl: ;  fexofenadine (ALLEGRA) 180 MG tablet, Take 180 mg by mouth daily., Disp: , Rfl:   Data Reviewed    Radiology: 07/17/2012 X-rays and MRI.   Lab: 07/17/2012 chemistry, urinalysis   Other: ED provider notes  Past Medical History  Diagnosis Date  . Hypertension   . History of breast cancer 2011    DCIS, Right breast. No radiation, took Aromasen 2012  . Senile osteoporosis     Took Fosamax [place].Canner   Past Surgical History  Procedure Laterality Date  . Laparoscopic cholecystectomy  05/26/2009  . Breast lumpectomy  12/24/2009    Right, Dr Jamey Ripa  . Cataract extraction w/ intraocular lens  implant, bilateral Bilateral [id number]  . Tonsillectomy and adenoidectomy     Family History  Problem Relation Age of Onset  . Heart disease Mother   . Heart  disease Father   . Diabetes Father   . Cancer Paternal Aunt     breast    Social History Narrative    Married Fayrene Fearing), 3 children, retired Engineer, agricultural. Moved from [place] 2005.   No smoking history.  2 alcoholic drinks daily   Living will, health care power of attorney documents have been completed   Review of Systems  [facility] (31)  Chief Complaint  Patient presents with  . Vertebral compression fracture   Review of Systems  DATA OBTAINED: from patient GENERAL: Feels well   No fevers, fatigue. Appetite improving. SKIN: No itch, rash or open wounds EYES: No eye pain, dryness or itching  No change in vision EARS: No earache, tinnitus, change in hearing NOSE: No congestion, drainage or bleeding MOUTH/THROAT: No mouth or tooth pain  No sore throat No difficulty chewing or swallowing RESPIRATORY: No cough, wheezing, SOB CARDIAC: No chest pain, palpitations  No edema. GI: No abdominal pain  No N/V/D or constipation  No heartburn or reflux  GU: No dysuria, frequency or urgency  No change in urine volume or character  Nighttime leakage, not new MUSCULOSKELETAL: No joint pain, swelling or stiffness  Lower back pain present  No muscle ache, pain, weakness  Gait is steady  No recent falls.  NEUROLOGIC: No dizziness, fainting, headache, imbalance, numbness  No change in mental status.  PSYCHIATRIC:  Easily frustrated lately, patient reports this is not like her. Sleeps well.   [name]    Physical Exam Filed Vitals:   07/25/12 1436  BP: 135/67  Pulse: 70  Height: 5\' 3"  (1.6 m)  Weight: 154 lb 6.4 oz (70.035 kg)   Body mass index is 27.36 kg/(m^2).  GENERAL APPEARANCE: No acute distress, appropriately groomed, normal body habitus. Alert, pleasant, conversant. HEAD: Normocephalic, atraumatic EYES: Conjunctiva/lids clear. Pupils round, reactive.  EARS: External exam WNL,. Decreased Hearing evident NOSE: No deformity or  discharge. MOUTH/THROAT: Lips w/o lesions. Oral mucosa, tongue moist, w/o lesion. Oropharynx w/o redness or lesions.  NECK: Supple, full ROM. No thyroid tenderness, enlargement or nodule LYMPHATICS: No head, neck or supraclavicular adenopathy RESPIRATORY: Breathing is even, unlabored. Lung sounds are clear and full.  CARDIOVASCULAR: Heart RRR. No murmur or extra heart sounds  ARTERIAL: No carotid bruit.  DP pulse 2+.  VENOUS: No varicosities. No venous stasis skin changes  EDEMA: No peripheral or periorbital edema. GASTROINTESTINAL: Abdomen is soft, non-tender, not distended w/ normal bowel sounds.  MUSCULOSKELETAL: Moves all extremities with full ROM, strength and tone. Back is without kyphosis, scoliosis. Lower mid back tenderness  NEUROLOGIC: Oriented to time, place, person. Speech clear, no tremor.  PSYCHIATRIC: Mood and affect appropriate to situation  ASSESSMENT/PLAN  Senile osteoporosis Last Dexa scan 2012 recommend repeat this year  Vertebral compression fracture Pain well managed with current medication, patient is not ready to reduced dose yet. For neurosurgery evaluation tomorrow.   Anticipate this patient will be ready to return to her Independent Living home later this week once home modifications are complete.    Marcellene Shivley T.Larrie Lucia, NP-C 07/25/2012

## 2012-07-25 NOTE — Assessment & Plan Note (Signed)
Last Dexa scan 2012 recommend repeat this year

## 2012-07-25 NOTE — Assessment & Plan Note (Signed)
Pain well managed with current medication, patient is not ready to reduced dose yet. For neurosurgery evaluation tomorrow.   Anticipate this patient will be ready to return to her Independent Living home later this week once home modifications are complete.

## 2012-08-15 ENCOUNTER — Other Ambulatory Visit (HOSPITAL_BASED_OUTPATIENT_CLINIC_OR_DEPARTMENT_OTHER): Payer: Medicare Other | Admitting: Lab

## 2012-08-15 ENCOUNTER — Ambulatory Visit (HOSPITAL_BASED_OUTPATIENT_CLINIC_OR_DEPARTMENT_OTHER): Payer: Medicare Other | Admitting: Oncology

## 2012-08-15 ENCOUNTER — Telehealth: Payer: Self-pay | Admitting: Oncology

## 2012-08-15 ENCOUNTER — Encounter: Payer: Self-pay | Admitting: Oncology

## 2012-08-15 VITALS — BP 114/71 | HR 88 | Temp 97.9°F | Resp 20 | Ht 63.0 in | Wt 153.5 lb

## 2012-08-15 DIAGNOSIS — Z853 Personal history of malignant neoplasm of breast: Secondary | ICD-10-CM

## 2012-08-15 LAB — CBC WITH DIFFERENTIAL/PLATELET
Basophils Absolute: 0 10*3/uL (ref 0.0–0.1)
Eosinophils Absolute: 0.4 10*3/uL (ref 0.0–0.5)
HGB: 13.3 g/dL (ref 11.6–15.9)
LYMPH%: 29.7 % (ref 14.0–49.7)
MONO#: 0.7 10*3/uL (ref 0.1–0.9)
NEUT#: 3.8 10*3/uL (ref 1.5–6.5)
Platelets: 65 10*3/uL — ABNORMAL LOW (ref 145–400)
RBC: 4.71 10*6/uL (ref 3.70–5.45)
RDW: 14.2 % (ref 11.2–14.5)
WBC: 7 10*3/uL (ref 3.9–10.3)

## 2012-08-15 LAB — LACTATE DEHYDROGENASE (CC13): LDH: 146 U/L (ref 125–245)

## 2012-08-15 NOTE — Telephone Encounter (Signed)
gv pt appt schedule for October.  °

## 2012-08-15 NOTE — Patient Instructions (Addendum)
You are doing well from breast cancer perspective, no evidence of recurrent breast cancer  Low Platelets: we will recheck in 3 months and follow up

## 2012-08-15 NOTE — Progress Notes (Signed)
OFFICE PROGRESS NOTE  CC: Dr. Cyndia Bent Dr. Royston Cowper, MD [address] [place] [address]   DIAGNOSIS: 79 year old female with DCIS status post lumpectomy in November 2011  PRIOR THERAPY:  #1 patient underwent a lumpectomy on 12/24/2009 that revealed a DCIS. The tumor was ER positive PR positive.  #2 patient was then begun on Aromasin 25 mg daily however she was discontinued 9 days later due to the fact that she developed  grade 3 arthralgias and myalgias.  #3 thrombocytopenia  CURRENT THERAPY: Observation  INTERVAL HISTORY: April [name] 79 y.o. female returns for followup visit. Overall she seems to be doing well she is off of all therapy patient has developed compression fractures in her back requiring some hospitalization. She is followed by Dr. Yetta Barre. Patient also was noted to be slightly more thrombocytopenic in comparison to previous counts. However she has no bleeding problems. She denies any myalgias or arthralgias. She has not noticed any masses in her breasts.Remainder of the 10 point review of systems is negative.[name]  MEDICAL HISTORY: Past Medical History  Diagnosis Date  . Hypertension   . History of breast cancer 2011    DCIS, Right breast. No radiation, took Aromasen 2012  . Senile osteoporosis     Took Fosamax x15years    ALLERGIES:  is allergic to codeine; penicillins; and sulfa antibiotics.  MEDICATIONS:  Current Outpatient Prescriptions  Medication Sig Dispense Refill  . calcium carbonate (OS-CAL) 600 MG TABS Take 600 mg by mouth daily. 630 mg daily      . cholecalciferol (VITAMIN D) 1000 UNITS tablet Take 2,000 Units by mouth daily.       . cholecalciferol (VITAMIN D) 1000 UNITS tablet Take 2,000 Units by mouth daily.      . mometasone (NASONEX) 50 MCG/ACT nasal spray Place 2 sprays into the nose daily.        . Multiple Vitamins-Minerals (MULTIVITAMIN WITH MINERALS) tablet Take 1 tablet by mouth daily.       . traMADol (ULTRAM) 50 MG tablet Take 50 mg by mouth every 8 (eight) hours as needed for pain (takes one at breakfast and one at dinner).       . vitamin E 400 UNIT capsule Take 400 Units by mouth daily.        [name] acetaminophen (TYLENOL) 500 MG tablet Take 500 mg by mouth every 6 (six) hours as needed for pain (pain).      . fexofenadine (ALLEGRA) 180 MG tablet Take 180 mg by mouth daily.      [name] ibuprofen (ADVIL,MOTRIN) 200 MG tablet Take 200 mg by mouth every 6 (six) hours as needed for pain.      . Omega-3 Fatty Acids (FISH OIL) 500 MG CAPS Take 500 mg by mouth daily.      [name] oxyCODONE-acetaminophen (PERCOCET/ROXICET) 5-325 MG per tablet Take 1-2 tablets by mouth every 6 (six) hours as needed for pain.  240 tablet  0   No current facility-administered medications for this visit.    SURGICAL HISTORY:  Past Surgical History  Procedure Laterality Date  . Laparoscopic cholecystectomy  05/26/2009  . Breast lumpectomy  12/24/2009    Right, Dr Jamey Ripa  . Cataract extraction w/ intraocular lens  implant, bilateral Bilateral [id number]  . Tonsillectomy and adenoidectomy      REVIEW OF SYSTEMS:  Constitutional: negative Eyes: negative Ears, nose, mouth, throat, and face: negative Respiratory: negative Cardiovascular: negative Gastrointestinal: negative Genitourinary:negative Musculoskeletal:positive for arthralgias, bone pain and  stiff joints Neurological: negative   PHYSICAL EXAMINATION: General appearance: alert, cooperative and appears stated age Head: Normocephalic, without obvious abnormality, atraumatic Neck: no adenopathy, no carotid bruit, no JVD, supple, symmetrical, trachea midline and thyroid not enlarged, symmetric, no tenderness/mass/nodules Lymph nodes: Cervical, supraclavicular, and axillary nodes normal. Resp: clear to auscultation bilaterally and normal percussion bilaterally Back: symmetric, no curvature. ROM normal. No CVA tenderness. Extremities: extremities normal,  atraumatic, no cyanosis or edema Neurologic: Alert and oriented X 3, normal strength and tone. Normal symmetric reflexes. Normal coordination and gait Sensory: normal Motor: grossly normal Reflexes: normal 2+ and symmetric Bilateral breast examination is performed. The right breast reveals a very well-healed surgical scar in the outer quadrant of the right breast. There are no nipple discharge no masses no chest skin changes. Left breast no masses nipple discharge or skin changes.  ECOG PERFORMANCE STATUS: 1 - Symptomatic but completely ambulatory  Blood pressure 114/71, pulse 88, temperature 97.9 F (36.6 C), temperature source Oral, resp. rate 20, height 5\' 3"  (1.6 m), weight 153 lb 8 oz (69.627 kg).  LABORATORY DATA:      RADIOGRAPHIC STUDIES:  No results found.  ASSESSMENT: 79 year old female with  #1 DCIS of the right breast status post lumpectomy in November 2011. The tumor was ER/PR positive. Patient did not undergo radiation therapy but was begun on Aromasin 25 mg daily. Unfortunately she could not tolerate the Aromasin. This was therefore discontinued. She has been on observation only and is doing well.  #2 compression fracture: Patient is being seen by Dr. Yetta Barre who is managing this.  #3 thrombocytopenia: Patient has had a low platelet counts all along however it is now dropping considerably with today's platelet count at 65,000.   PLAN:  #1 breast cancer: Observation.  #2 thrombocytopenia: We will repeat a CBC in 3 months time  #3 I will see the patient back in 3 months time for followup as well.  All questions were answered. The patient knows to call the clinic with any problems, questions or concerns. We can certainly see the patient much sooner if necessary.  I spent 20 minutes counseling the patient face to face. The total time spent in the appointment was 30 minutes.    [name], MD Medical/Oncology [facility] [phone]  (beeper) [phone] (Office)  08/15/2012, 12:37 PM

## 2012-08-17 LAB — HM DEXA SCAN

## 2012-08-29 ENCOUNTER — Other Ambulatory Visit: Payer: Self-pay | Admitting: Ophthalmology

## 2012-09-09 ENCOUNTER — Telehealth: Payer: Self-pay | Admitting: Dietician

## 2012-10-12 ENCOUNTER — Other Ambulatory Visit: Payer: Self-pay | Admitting: Oncology

## 2012-10-12 DIAGNOSIS — Z853 Personal history of malignant neoplasm of breast: Secondary | ICD-10-CM

## 2012-11-03 ENCOUNTER — Telehealth: Payer: Self-pay | Admitting: Emergency Medicine

## 2012-11-03 ENCOUNTER — Other Ambulatory Visit: Payer: Self-pay | Admitting: Emergency Medicine

## 2012-11-03 NOTE — Telephone Encounter (Signed)
Reviewed labs from Dr Lynne Logan office with Dr Welton Flakes; per Dr Welton Flakes, ok to reschedule follow up appointment in 3 months. Notified patient's spouse of this. He verbalized understanding.  Informed him that the schedulers should be contacting them soon with a new appointment for mid to late January 2015. Instructed him to call for any further questions or concerns.

## 2012-11-07 ENCOUNTER — Telehealth: Payer: Self-pay | Admitting: Oncology

## 2012-11-07 NOTE — Telephone Encounter (Signed)
, °

## 2012-11-15 ENCOUNTER — Other Ambulatory Visit: Payer: Medicare Other | Admitting: Lab

## 2012-11-15 ENCOUNTER — Ambulatory Visit: Payer: Medicare Other | Admitting: Oncology

## 2012-11-24 ENCOUNTER — Other Ambulatory Visit: Payer: Self-pay | Admitting: Emergency Medicine

## 2012-12-02 ENCOUNTER — Ambulatory Visit
Admission: RE | Admit: 2012-12-02 | Discharge: 2012-12-02 | Disposition: A | Discharge status: Home / Self Care | Payer: Medicare Other | Source: Ambulatory Visit | Attending: Oncology | Admitting: Oncology

## 2012-12-02 DIAGNOSIS — Z853 Personal history of malignant neoplasm of breast: Secondary | ICD-10-CM

## 2013-02-20 ENCOUNTER — Ambulatory Visit (HOSPITAL_BASED_OUTPATIENT_CLINIC_OR_DEPARTMENT_OTHER): Payer: Medicare Other | Admitting: Oncology

## 2013-02-20 ENCOUNTER — Other Ambulatory Visit (HOSPITAL_BASED_OUTPATIENT_CLINIC_OR_DEPARTMENT_OTHER): Payer: Medicare Other

## 2013-02-20 ENCOUNTER — Telehealth: Payer: Self-pay | Admitting: Oncology

## 2013-02-20 ENCOUNTER — Encounter: Payer: Self-pay | Admitting: Oncology

## 2013-02-20 VITALS — BP 123/62 | HR 80 | Temp 98.0°F | Resp 18 | Ht 63.0 in | Wt 153.3 lb

## 2013-02-20 DIAGNOSIS — Z853 Personal history of malignant neoplasm of breast: Secondary | ICD-10-CM

## 2013-02-20 DIAGNOSIS — D696 Thrombocytopenia, unspecified: Secondary | ICD-10-CM

## 2013-02-20 LAB — CBC WITH DIFFERENTIAL/PLATELET
BASO%: 0.7 % (ref 0.0–2.0)
Basophils Absolute: 0.1 10*3/uL (ref 0.0–0.1)
EOS ABS: 1.4 10*3/uL — AB (ref 0.0–0.5)
EOS%: 14 % — AB (ref 0.0–7.0)
HCT: 39.3 % (ref 34.8–46.6)
HGB: 13 g/dL (ref 11.6–15.9)
LYMPH%: 27.5 % (ref 14.0–49.7)
MCH: 27.8 pg (ref 25.1–34.0)
MCHC: 33.2 g/dL (ref 31.5–36.0)
MCV: 83.8 fL (ref 79.5–101.0)
MONO#: 0.8 10*3/uL (ref 0.1–0.9)
MONO%: 7.7 % (ref 0.0–14.0)
NEUT%: 50.1 % (ref 38.4–76.8)
NEUTROS ABS: 4.9 10*3/uL (ref 1.5–6.5)
Platelets: 103 10*3/uL — ABNORMAL LOW (ref 145–400)
RBC: 4.69 10*6/uL (ref 3.70–5.45)
RDW: 15.9 % — AB (ref 11.2–14.5)
WBC: 9.8 10*3/uL (ref 3.9–10.3)
lymph#: 2.7 10*3/uL (ref 0.9–3.3)

## 2013-02-20 LAB — TECHNOLOGIST REVIEW

## 2013-02-20 NOTE — Progress Notes (Signed)
OFFICE PROGRESS NOTE  CC: Dr. Neldon Mc Dr. Cleone Slim, MD [address] [place] [address]   DIAGNOSIS: 80 year old female with DCIS status post lumpectomy in November 2011  PRIOR THERAPY:  #1 patient underwent a lumpectomy on 12/24/2009 that revealed a DCIS. The tumor was ER positive PR positive.  #2 patient was then begun on Aromasin 25 mg daily however she was discontinued 9 days later due to the fact that she developed  grade 3 arthralgias and myalgias.  #3 thrombocytopenia  CURRENT THERAPY: Observation  INTERVAL HISTORY: April [name] 80 y.o. female returns for followup visit. Overall she seems to be doing well she is off of all therapy. However she has no bleeding problems. She denies any myalgias or arthralgias. She has not noticed any masses in her breasts.Remainder of the 10 point review of systems is negative.[name]  MEDICAL HISTORY: Past Medical History  Diagnosis Date  . Hypertension   . History of breast cancer 2011    DCIS, Right breast. No radiation, took Aromasen 2012  . Senile osteoporosis     Took Fosamax x15years  . Cancer     ALLERGIES:  is allergic to codeine; penicillins; and sulfa antibiotics.  MEDICATIONS:  Current Outpatient Prescriptions  Medication Sig Dispense Refill  . acetaminophen (TYLENOL) 500 MG tablet Take 500 mg by mouth every 6 (six) hours as needed for pain (pain).      . calcium carbonate (OS-CAL) 600 MG TABS Take 600 mg by mouth daily. 630 mg daily      . cholecalciferol (VITAMIN D) 1000 UNITS tablet Take 4,000 Units by mouth daily.       . mometasone (NASONEX) 50 MCG/ACT nasal spray Place 2 sprays into the nose daily.        . Multiple Vitamins-Minerals (MULTIVITAMIN WITH MINERALS) tablet Take 1 tablet by mouth daily.      . vitamin E 400 UNIT capsule Take 400 Units by mouth daily.        . fexofenadine (ALLEGRA) 180 MG tablet Take 180 mg by mouth daily.      [name] ibuprofen (ADVIL,MOTRIN)  200 MG tablet Take 200 mg by mouth every 6 (six) hours as needed for pain.       No current facility-administered medications for this visit.    SURGICAL HISTORY:  Past Surgical History  Procedure Laterality Date  . Laparoscopic cholecystectomy  05/26/2009  . Breast lumpectomy  12/24/2009    Right, Dr Margot Chimes  . Cataract extraction w/ intraocular lens  implant, bilateral Bilateral [id number]  . Tonsillectomy and adenoidectomy      REVIEW OF SYSTEMS:  Constitutional: negative Eyes: negative Ears, nose, mouth, throat, and face: negative Respiratory: negative Cardiovascular: negative Gastrointestinal: negative Genitourinary:negative Musculoskeletal:positive for arthralgias, bone pain and stiff joints Neurological: negative   PHYSICAL EXAMINATION: General appearance: alert, cooperative and appears stated age Head: Normocephalic, without obvious abnormality, atraumatic Neck: no adenopathy, no carotid bruit, no JVD, supple, symmetrical, trachea midline and thyroid not enlarged, symmetric, no tenderness/mass/nodules Lymph nodes: Cervical, supraclavicular, and axillary nodes normal. Resp: clear to auscultation bilaterally and normal percussion bilaterally Back: symmetric, no curvature. ROM normal. No CVA tenderness. Extremities: extremities normal, atraumatic, no cyanosis or edema Neurologic: Alert and oriented X 3, normal strength and tone. Normal symmetric reflexes. Normal coordination and gait Sensory: normal Motor: grossly normal Reflexes: normal 2+ and symmetric Bilateral breast examination is performed. The right breast reveals a very well-healed surgical scar in the outer quadrant of the right breast.  There are no nipple discharge no masses no chest skin changes. Left breast no masses nipple discharge or skin changes.  ECOG PERFORMANCE STATUS: 1 - Symptomatic but completely ambulatory  Blood pressure 123/62, pulse 80, temperature 98 F (36.7 C), temperature source Oral, resp.  rate 18, height 5\' 3"  (1.6 m), weight 153 lb 4.8 oz (69.536 kg).  LABORATORY DATA:      RADIOGRAPHIC STUDIES:  No results found.  ASSESSMENT: 80 year old female with  #1 DCIS of the right breast status post lumpectomy in November 2011. The tumor was ER/PR positive. Patient did not undergo radiation therapy but was begun on Aromasin 25 mg daily. Unfortunately she could not tolerate the Aromasin. This was therefore discontinued. She has been on observation only and is doing well.   #2 thrombocytopenia: most likely ITP no treatment planned for now  PLAN:  #1 breast cancer: Observation.  #2 thrombocytopenia: improving and she is followed by Dr. Minna Antis  #3 I will see the patient back in 12 months time for followup as well.  All questions were answered. The patient knows to call the clinic with any problems, questions or concerns. We can certainly see the patient much sooner if necessary.  I spent 15 minutes counseling the patient face to face. The total time spent in the appointment was 30 minutes.    Marcy Panning, MD Medical/Oncology [facility] [phone] (beeper) [phone] (Office)  02/20/2013, 9:47 AM

## 2013-02-20 NOTE — Telephone Encounter (Signed)
, °

## 2013-06-13 ENCOUNTER — Telehealth: Payer: Self-pay

## 2013-06-13 NOTE — Telephone Encounter (Signed)
Lab results with cover sheet notes rcvd by fax from [place] 06/12/13.  Copy to [place].  Original to scan.

## 2013-06-14 ENCOUNTER — Telehealth: Payer: Self-pay | Admitting: Oncology

## 2013-06-14 NOTE — Telephone Encounter (Signed)
returned pt call and sched new appt per pt request

## 2013-06-28 ENCOUNTER — Ambulatory Visit (HOSPITAL_BASED_OUTPATIENT_CLINIC_OR_DEPARTMENT_OTHER): Payer: Medicare Other

## 2013-06-28 ENCOUNTER — Ambulatory Visit (HOSPITAL_BASED_OUTPATIENT_CLINIC_OR_DEPARTMENT_OTHER): Payer: Medicare Other | Admitting: Adult Health

## 2013-06-28 ENCOUNTER — Encounter: Payer: Self-pay | Admitting: Adult Health

## 2013-06-28 ENCOUNTER — Telehealth: Payer: Self-pay | Admitting: Oncology

## 2013-06-28 VITALS — BP 124/67 | HR 86 | Temp 98.2°F | Resp 18 | Ht 63.0 in | Wt 154.2 lb

## 2013-06-28 DIAGNOSIS — D72829 Elevated white blood cell count, unspecified: Secondary | ICD-10-CM

## 2013-06-28 DIAGNOSIS — Z853 Personal history of malignant neoplasm of breast: Secondary | ICD-10-CM

## 2013-06-28 DIAGNOSIS — D696 Thrombocytopenia, unspecified: Secondary | ICD-10-CM

## 2013-06-28 DIAGNOSIS — Z170 Estrogen receptor positive status [ER+]: Secondary | ICD-10-CM

## 2013-06-28 DIAGNOSIS — Z87898 Personal history of other specified conditions: Secondary | ICD-10-CM

## 2013-06-28 LAB — CBC WITH DIFFERENTIAL/PLATELET
BASO%: 1.5 % (ref 0.0–2.0)
Basophils Absolute: 0.2 10*3/uL — ABNORMAL HIGH (ref 0.0–0.1)
EOS%: 22.5 % — AB (ref 0.0–7.0)
Eosinophils Absolute: 3.3 10*3/uL — ABNORMAL HIGH (ref 0.0–0.5)
HCT: 35.8 % (ref 34.8–46.6)
HGB: 11.4 g/dL — ABNORMAL LOW (ref 11.6–15.9)
LYMPH%: 19.4 % (ref 14.0–49.7)
MCH: 25.5 pg (ref 25.1–34.0)
MCHC: 31.7 g/dL (ref 31.5–36.0)
MCV: 80.2 fL (ref 79.5–101.0)
MONO#: 0.6 10*3/uL (ref 0.1–0.9)
MONO%: 3.9 % (ref 0.0–14.0)
NEUT%: 52.7 % (ref 38.4–76.8)
NEUTROS ABS: 7.7 10*3/uL — AB (ref 1.5–6.5)
Platelets: 159 10*3/uL (ref 145–400)
RBC: 4.47 10*6/uL (ref 3.70–5.45)
RDW: 15.5 % — AB (ref 11.2–14.5)
WBC: 14.6 10*3/uL — ABNORMAL HIGH (ref 3.9–10.3)
lymph#: 2.8 10*3/uL (ref 0.9–3.3)

## 2013-06-28 LAB — COMPREHENSIVE METABOLIC PANEL (CC13)
ALBUMIN: 4.2 g/dL (ref 3.5–5.0)
ALK PHOS: 67 U/L (ref 40–150)
ALT: 12 U/L (ref 0–55)
AST: 16 U/L (ref 5–34)
Anion Gap: 14 mEq/L — ABNORMAL HIGH (ref 3–11)
BUN: 12.3 mg/dL (ref 7.0–26.0)
CO2: 21 mEq/L — ABNORMAL LOW (ref 22–29)
Calcium: 9.7 mg/dL (ref 8.4–10.4)
Chloride: 108 mEq/L (ref 98–109)
Creatinine: 0.8 mg/dL (ref 0.6–1.1)
Glucose: 99 mg/dl (ref 70–140)
POTASSIUM: 3.7 meq/L (ref 3.5–5.1)
SODIUM: 143 meq/L (ref 136–145)
Total Bilirubin: 0.54 mg/dL (ref 0.20–1.20)
Total Protein: 7.1 g/dL (ref 6.4–8.3)

## 2013-06-28 LAB — LACTATE DEHYDROGENASE (CC13): LDH: 176 U/L (ref 125–245)

## 2013-06-28 NOTE — Progress Notes (Signed)
OFFICE PROGRESS NOTE  CC: April [name] Dr. Cleone Slim, MD [address] [place] [address]   DIAGNOSIS: 80 year old female with DCIS status post lumpectomy in November 2011  PRIOR THERAPY:  #1 patient underwent a lumpectomy on 12/24/2009 that revealed a DCIS. The tumor was ER positive PR positive.  #2 patient was then begun on Aromasin 25 mg daily however she was discontinued 9 days later due to the fact that she developed  grade 3 arthralgias and myalgias.  #3 thrombocytopenia  CURRENT THERAPY: Observation  INTERVAL HISTORY: April [name] 80 y.o. female returns for followup visit due to leukocytosis.  She tells me that she was sent here by Dr. Minna Antis due to this and that he and Dr. Humphrey Rolls had discussed her case and she was to return to be evaluated.  She also wants to know if I have records that Dr. Minna Antis had sent to Dr. Humphrey Rolls and discussed with her.  She has also been having diarrhea, that has been ongoing for about a year, it occurs two times a day, is very loose, and occurs approximately 30 minutes after each meal with little to no warning.  She denies any blood or pus in the stool.  She used to be a swimmer in a saline pool and did notice some burning in her arms and legs, she hasn't swam since, and has been following with Dr. Ubaldo Glassing in dermatology.  She did receive ultravoilet treatment and stopped swimming, took lukewarm showers it has resolved.  She does report easy bruising in her forearms bilaterally.  She does report increased fatigue that has been slowly worsening over the past year.  She denies night sweats, fevers, chills, or any further concerns.    MEDICAL HISTORY: Past Medical History  Diagnosis Date  . Hypertension   . History of breast cancer 2011    DCIS, Right breast. No radiation, took Aromasen 2012  . Senile osteoporosis     Took Fosamax x15years  . Cancer     ALLERGIES:  is allergic to codeine; penicillins; and sulfa  antibiotics.  MEDICATIONS:  Current Outpatient Prescriptions  Medication Sig Dispense Refill  . acetaminophen (TYLENOL) 500 MG tablet Take 500 mg by mouth every 6 (six) hours as needed for pain (pain).      . calcium carbonate (OS-CAL) 600 MG TABS Take 600 mg by mouth daily. 630 mg daily      . Cetirizine HCl (ZYRTEC PO) Take 1 tablet by mouth every morning.      . cholecalciferol (VITAMIN D) 1000 UNITS tablet Take 4,000 Units by mouth daily.       . halobetasol (ULTRAVATE) 0.05 % cream Apply topically every morning. Pt uses as needed      . metroNIDAZOLE (METROGEL) 0.75 % gel Apply 1 application topically every morning.      . mometasone (NASONEX) 50 MCG/ACT nasal spray Place 2 sprays into the nose daily.        . Multiple Vitamins-Minerals (MULTIVITAMIN WITH MINERALS) tablet Take 1 tablet by mouth daily.       No current facility-administered medications for this visit.    SURGICAL HISTORY:  Past Surgical History  Procedure Laterality Date  . Laparoscopic cholecystectomy  05/26/2009  . Breast lumpectomy  12/24/2009    Right, Dr Margot Chimes  . Cataract extraction w/ intraocular lens  implant, bilateral Bilateral [id number]  . Tonsillectomy and adenoidectomy      REVIEW OF SYSTEMS:  A 10 point review of systems  was conducted and is otherwise negative except for what is noted above.     PHYSICAL EXAMINATION: BP 124/67  Pulse 86  Temp(Src) 98.2 F (36.8 C) (Oral)  Resp 18  Ht 5\' 3"  (1.6 m)  Wt 154 lb 3.2 oz (69.945 kg)  BMI 27.32 kg/m2 GENERAL: Patient is a well appearing female in no acute distress HEENT:  Sclerae anicteric.  Oropharynx clear and moist. No ulcerations or evidence of oropharyngeal candidiasis. Neck is supple.  NODES:  No cervical, supraclavicular, or axillary lymphadenopathy palpated.  BREAST EXAM:  Right breast s/p lumpectomy, no nodularity, no masses, left breast no nodularity, no masses, benign bilateral breast exam. LUNGS:  Clear to auscultation bilaterally.  No  wheezes or rhonchi. HEART:  Regular rate and rhythm. No murmur appreciated. ABDOMEN:  Soft, nontender.  Positive, normoactive bowel sounds. No organomegaly palpated. MSK:  No focal spinal tenderness to palpation. Full range of motion bilaterally in the upper extremities. EXTREMITIES:  No peripheral edema.   SKIN:  Clear with no obvious rashes or skin changes. No nail dyscrasia. NEURO:  Nonfocal. Well oriented.  Appropriate affect. ECOG PERFORMANCE STATUS: 1 - Symptomatic but completely ambulatory   LABORATORY DATA: Pending    RADIOGRAPHIC STUDIES:  No results found.  ASSESSMENT: 80 year old female with  #1 DCIS of the right breast status post lumpectomy in November 2011. The tumor was ER/PR positive. Patient did not undergo radiation therapy but was begun on Aromasin 25 mg daily. Unfortunately she could not tolerate the Aromasin. This was therefore discontinued. She has been on observation only and is doing well.  #2 thrombocytopenia: most likely ITP, patient is observed  PLAN:  #1 breast cancer: Observation.  She has no sign of recurrence.  I continued to encourage healthy diet, exercise, and self breast exams.    #2 thrombocytopenia: improving and she is followed by Dr. Minna Antis  #3 Leukocytosis:  Patient has brought a copy of recent CBCs.  I reviewed this with Dr. Jana Hakim today.  I have added on labs to the end of her appointment.  Should she appear to have lymphocytosis, I will call her and have her return to send peripheral flow cytometry.  I am interested to see what the work up of her recent diarrhea is, as this could definitely be the etiology.    All questions were answered. The patient knows to call the clinic with any problems, questions or concerns. We can certainly see the patient much sooner if necessary.  I spent 15 minutes counseling the patient face to face. The total time spent in the appointment was 30 minutes.   Minette Headland, [place] [phone] 07/02/2013, 9:00 PM

## 2013-06-28 NOTE — Telephone Encounter (Signed)
per pof to sch appt-sent pt to lad as add on per [facility] pt copy of sch

## 2013-06-28 NOTE — Patient Instructions (Signed)
You will have lab work today.  I will follow up with you about this if we need to do any additional testing.    Please call us if you have any questions or concerns.    You will see Dr. Jana Hakim in September.

## 2013-06-29 ENCOUNTER — Telehealth: Payer: Self-pay | Admitting: *Deleted

## 2013-06-29 NOTE — Telephone Encounter (Signed)
Returned call to patient from voicemail requesting return call from [place]. Called patient and informed her that I would get this information to her for review and comments.  She is very concerned and anxious of the cause of her elevated WBC's.  She would like a return call tomorrow-Friday,  and make sure Dr Minna Antis receives these results.

## 2013-06-30 ENCOUNTER — Telehealth: Payer: Self-pay | Admitting: Adult Health

## 2013-06-30 NOTE — Telephone Encounter (Signed)
Called patient and discussed labs with patient.  No increase in lymphocytes.  Reviewed labs with Dr. Jana Hakim.  Patient to f/u with Dr. Minna Antis for further evaluation of diarrhea.    Minette Headland, [place] [phone]

## 2013-07-26 ENCOUNTER — Other Ambulatory Visit: Payer: Self-pay | Admitting: Dermatology

## 2013-10-10 ENCOUNTER — Ambulatory Visit (HOSPITAL_BASED_OUTPATIENT_CLINIC_OR_DEPARTMENT_OTHER): Payer: Medicare Other | Admitting: Oncology

## 2013-10-10 ENCOUNTER — Telehealth: Payer: Self-pay | Admitting: Oncology

## 2013-10-10 VITALS — BP 129/55 | HR 81 | Temp 98.1°F | Resp 18 | Ht 63.0 in | Wt 153.0 lb

## 2013-10-10 DIAGNOSIS — D72829 Elevated white blood cell count, unspecified: Secondary | ICD-10-CM

## 2013-10-10 DIAGNOSIS — M4850XS Collapsed vertebra, not elsewhere classified, site unspecified, sequela of fracture: Secondary | ICD-10-CM

## 2013-10-10 DIAGNOSIS — D696 Thrombocytopenia, unspecified: Secondary | ICD-10-CM

## 2013-10-10 DIAGNOSIS — R197 Diarrhea, unspecified: Secondary | ICD-10-CM

## 2013-10-10 DIAGNOSIS — IMO0001 Reserved for inherently not codable concepts without codable children: Secondary | ICD-10-CM

## 2013-10-10 DIAGNOSIS — M810 Age-related osteoporosis without current pathological fracture: Secondary | ICD-10-CM

## 2013-10-10 DIAGNOSIS — Z853 Personal history of malignant neoplasm of breast: Secondary | ICD-10-CM

## 2013-10-10 NOTE — Progress Notes (Signed)
OFFICE PROGRESS NOTE  CC: Dr. Neldon Mc Dr. Cleone Slim, MD [address], [place] [address] OTHER MDS: Clayton Lefort  DIAGNOSIS: 80 year old female with DCIS status post lumpectomy in November 2011  PRIOR THERAPY:  #1 patient underwent a lumpectomy on 12/24/2009 that revealed a DCIS. The tumor was ER positive PR positive.  #2 patient was then begun on Aromasin 25 mg daily however she was discontinued 9 days later due to the fact that she developed  grade 3 arthralgias and myalgias.  #3 thrombocytopenia  CURRENT THERAPY: Observation  INTERVAL HISTORY: April [name] returns today with her daughter for followup of her noninvasive breast cancer. The interval history is significant for her having developed diarrhea, now for several months, meaning that she has between 3 and 6 very loose bowel movements daily. This is severely affecting her lifestyle. She also developed significant urticaria. She saw Dr. Ubaldo Glassing regarding that and receive some UV treatment which apparently resolved that problem.   REVIEW OF SYSTEMS:  She complains of chronic stress urinary incontinence. Occasionally she has mild headaches. She feels fatigued, but this does not affect her daily activities. A detailed review of systems today was otherwise noncontributory  MEDICAL HISTORY: Past Medical History  Diagnosis Date  . Hypertension   . History of breast cancer 2011    DCIS, Right breast. No radiation, took Aromasen 2012  . Senile osteoporosis     Took Fosamax x15years  . Cancer     ALLERGIES:  is allergic to codeine; penicillins; and sulfa antibiotics.  MEDICATIONS:  Current Outpatient Prescriptions  Medication Sig Dispense Refill  . acetaminophen (TYLENOL) 500 MG tablet Take 500 mg by mouth every 6 (six) hours as needed for pain (pain).      . calcium carbonate (OS-CAL) 600 MG TABS Take 600 mg by mouth daily. 630 mg daily      . Cetirizine HCl  (ZYRTEC PO) Take 1 tablet by mouth every morning.      . cholecalciferol (VITAMIN D) 1000 UNITS tablet Take 4,000 Units by mouth daily.       . halobetasol (ULTRAVATE) 0.05 % cream Apply topically every morning. Pt uses as needed      . metroNIDAZOLE (METROGEL) 0.75 % gel Apply 1 application topically every morning.      . mometasone (NASONEX) 50 MCG/ACT nasal spray Place 2 sprays into the nose daily.        . Multiple Vitamins-Minerals (MULTIVITAMIN WITH MINERALS) tablet Take 1 tablet by mouth daily.       No current facility-administered medications for this visit.    SURGICAL HISTORY:  Past Surgical History  Procedure Laterality Date  . Laparoscopic cholecystectomy  05/26/2009  . Breast lumpectomy  12/24/2009    Right, Dr Margot Chimes  . Cataract extraction w/ intraocular lens  implant, bilateral Bilateral [id number]  . Tonsillectomy and adenoidectomy       PHYSICAL EXAMINATION: Filed Vitals:   10/10/13 1317  BP: 129/55  Pulse: 81  Temp: 98.1 F (36.7 C)  Resp: 18   Body mass index is 27.11 kg/(m^2).  ECOG PERFORMANCE STATUS: 1 - Symptomatic but completely ambulatory  Sclerae unicteric, pupils equal and reactive Oropharynx clear and moist-- no thrush or other lesions No cervical or supraclavicular adenopathy Lungs no rales or rhonchi Heart regular rate and rhythm Abd soft, nontender, positive bowel sounds, no masses palpated MSK no focal spinal tenderness, no upper extremity lymphedema Neuro: nonfocal, well oriented, appropriate affect Breasts: The right breast  is status post lumpectomy. There is no evidence of local recurrence. The right axilla is benign. The left breast is unremarkable.   LABORATORY DATA: Outside labs reviewed   RADIOGRAPHIC STUDIES:  COMPARISON: Multiple priors  ACR Breast Density Category b: There are scattered areas of  fibroglandular density.  FINDINGS:  The patient has had a right lumpectomy. No new mass or suspicious  calcifications are seen  in either breast. Compared to priors.  Mammographic images were processed with CAD.  IMPRESSION:  No mammographic evidence of local recurrence, right breast. No  mammographic evidence of malignancy, left breast.  RECOMMENDATION:  Diagnostic mammography in 1 year.  I have discussed the findings and recommendations with the patient.  Results were also provided in writing at the conclusion of the  visit. If applicable, a reminder letter will be sent to the patient  regarding the next appointment.  BI-RADS CATEGORY 1: Negative  Electronically Signed  By: Duke Salvia M.D.  On: 12/02/2012 11:47    ASSESSMENT: 80 y.o. [place] woman   #1 DCIS of the right breast status post lumpectomy in November 2011. The tumor was ER/PR positive. Patient did not undergo radiation therapy but was begun on Aromasin 25 mg daily. Unfortunately she could not tolerate the Aromasin. This was therefore discontinued. She has been on observation only and is doing well.  #2 thrombocytopenia: most likely ITP, currentl with normal counts  # 3 persistent diarrhea not associated with weight loss  (a) positive Blastocystis in stool, no response to metronidazole  #4 leukocytosis, without increased lymphocytes but with increase eos--likely reactive, due to #3  PLAN:  I spent approximately 40 minutes with Joaquim Lai and her daughter are going over her situation. I gave her information on Blastocystis, about which I know little bit from what I am learning, it may be a commensal rather than a pathogen, when it is associated with diarrhea because of the diarrhea may still be something else, and treatment results are erratic. Clearly she needs further evaluation for the diarrhea and this is being planned through Dr. Erlinda Hong office.  We went over the differential in her leukocytosis. Her calculated absolute lymphocyte count is less than 3. There is no evidence of chronic lymphoid leukemia or other leukemia. She does have an  elevated eosinophil count, likely reactive.  Interestingly, with the current problem, her thrombocytopenia has resolved. Platelets to rise secondary to infection and iron deficiency, and other reasons and I have no doubt once her current diarrhea problems resolves her platelets will go back down to the 100,000 range.  There is no evidence of breast cancer activity or recurrence. She will see me again in a year and she could "graduate" at that time, she expressed a strong interest in participating in our survivorship clinic and we will discuss that at the next visit. She knows to call for any problems that may develop before then.  Chauncey Cruel, MD  10/10/2013, 1:23 PM

## 2013-10-10 NOTE — Telephone Encounter (Signed)
, °

## 2013-10-17 ENCOUNTER — Other Ambulatory Visit: Payer: Self-pay | Admitting: Gastroenterology

## 2013-10-17 DIAGNOSIS — D721 Eosinophilia, unspecified: Secondary | ICD-10-CM

## 2013-10-17 DIAGNOSIS — D649 Anemia, unspecified: Secondary | ICD-10-CM

## 2013-10-17 DIAGNOSIS — R197 Diarrhea, unspecified: Secondary | ICD-10-CM

## 2013-10-23 ENCOUNTER — Other Ambulatory Visit: Payer: Medicare Other

## 2013-10-25 ENCOUNTER — Ambulatory Visit
Admission: RE | Admit: 2013-10-25 | Discharge: 2013-10-25 | Disposition: A | Discharge status: Home / Self Care | Payer: Medicare Other | Source: Ambulatory Visit | Attending: Gastroenterology | Admitting: Gastroenterology

## 2013-10-25 DIAGNOSIS — D721 Eosinophilia, unspecified: Secondary | ICD-10-CM

## 2013-10-25 DIAGNOSIS — R197 Diarrhea, unspecified: Secondary | ICD-10-CM

## 2013-10-25 DIAGNOSIS — D649 Anemia, unspecified: Secondary | ICD-10-CM

## 2013-10-25 MED ORDER — IOHEXOL 300 MG/ML  SOLN
100.0000 mL | Freq: Once | INTRAMUSCULAR | Status: AC | PRN
  Administered 2013-10-25: 100 mL via INTRAVENOUS

## 2013-11-03 ENCOUNTER — Other Ambulatory Visit: Payer: Self-pay | Admitting: Internal Medicine

## 2013-11-03 DIAGNOSIS — Z853 Personal history of malignant neoplasm of breast: Secondary | ICD-10-CM

## 2013-11-28 ENCOUNTER — Other Ambulatory Visit: Payer: Self-pay | Admitting: Allergy and Immunology

## 2013-11-28 DIAGNOSIS — G4452 New daily persistent headache (NDPH): Secondary | ICD-10-CM

## 2013-12-07 ENCOUNTER — Ambulatory Visit
Admission: RE | Admit: 2013-12-07 | Discharge: 2013-12-07 | Disposition: A | Discharge status: Home / Self Care | Payer: Medicare Other | Source: Ambulatory Visit | Attending: Allergy and Immunology | Admitting: Allergy and Immunology

## 2013-12-07 DIAGNOSIS — G4452 New daily persistent headache (NDPH): Secondary | ICD-10-CM

## 2013-12-07 MED ORDER — GADOBENATE DIMEGLUMINE 529 MG/ML IV SOLN
13.0000 mL | Freq: Once | INTRAVENOUS | Status: AC | PRN
Start: 2013-12-07 — End: 2013-12-07
  Administered 2013-12-07: 13 mL via INTRAVENOUS

## 2013-12-08 ENCOUNTER — Ambulatory Visit
Admission: RE | Admit: 2013-12-08 | Discharge: 2013-12-08 | Disposition: A | Discharge status: Home / Self Care | Payer: Medicare Other | Source: Ambulatory Visit | Attending: Internal Medicine | Admitting: Internal Medicine

## 2013-12-08 DIAGNOSIS — Z853 Personal history of malignant neoplasm of breast: Secondary | ICD-10-CM

## 2013-12-08 LAB — HM MAMMOGRAPHY

## 2014-01-24 ENCOUNTER — Inpatient Hospital Stay (HOSPITAL_COMMUNITY)
Admission: EM | Admit: 2014-01-24 | Discharge: 2014-02-09 | DRG: 329 | Disposition: A | Discharge status: Skilled Nursing Facility | Payer: Medicare Other | Attending: Internal Medicine | Admitting: Internal Medicine

## 2014-01-24 ENCOUNTER — Encounter (HOSPITAL_COMMUNITY): Payer: Self-pay | Admitting: Emergency Medicine

## 2014-01-24 ENCOUNTER — Emergency Department (HOSPITAL_COMMUNITY): Payer: Medicare Other

## 2014-01-24 DIAGNOSIS — K317 Polyp of stomach and duodenum: Secondary | ICD-10-CM | POA: Diagnosis present

## 2014-01-24 DIAGNOSIS — D6489 Other specified anemias: Secondary | ICD-10-CM

## 2014-01-24 DIAGNOSIS — K5521 Angiodysplasia of colon with hemorrhage: Secondary | ICD-10-CM | POA: Diagnosis not present

## 2014-01-24 DIAGNOSIS — Z9049 Acquired absence of other specified parts of digestive tract: Secondary | ICD-10-CM | POA: Diagnosis present

## 2014-01-24 DIAGNOSIS — M810 Age-related osteoporosis without current pathological fracture: Secondary | ICD-10-CM | POA: Diagnosis present

## 2014-01-24 DIAGNOSIS — R000 Tachycardia, unspecified: Secondary | ICD-10-CM | POA: Diagnosis not present

## 2014-01-24 DIAGNOSIS — K91841 Postprocedural hemorrhage and hematoma of a digestive system organ or structure following other procedure: Secondary | ICD-10-CM | POA: Diagnosis not present

## 2014-01-24 DIAGNOSIS — Z79899 Other long term (current) drug therapy: Secondary | ICD-10-CM

## 2014-01-24 DIAGNOSIS — Z882 Allergy status to sulfonamides status: Secondary | ICD-10-CM

## 2014-01-24 DIAGNOSIS — D62 Acute posthemorrhagic anemia: Secondary | ICD-10-CM | POA: Diagnosis not present

## 2014-01-24 DIAGNOSIS — T424X5A Adverse effect of benzodiazepines, initial encounter: Secondary | ICD-10-CM | POA: Diagnosis not present

## 2014-01-24 DIAGNOSIS — I10 Essential (primary) hypertension: Secondary | ICD-10-CM | POA: Diagnosis present

## 2014-01-24 DIAGNOSIS — K2970 Gastritis, unspecified, without bleeding: Secondary | ICD-10-CM | POA: Diagnosis present

## 2014-01-24 DIAGNOSIS — R410 Disorientation, unspecified: Secondary | ICD-10-CM | POA: Diagnosis not present

## 2014-01-24 DIAGNOSIS — K6389 Other specified diseases of intestine: Secondary | ICD-10-CM | POA: Diagnosis present

## 2014-01-24 DIAGNOSIS — Z885 Allergy status to narcotic agent status: Secondary | ICD-10-CM | POA: Diagnosis not present

## 2014-01-24 DIAGNOSIS — R938 Abnormal findings on diagnostic imaging of other specified body structures: Secondary | ICD-10-CM | POA: Diagnosis present

## 2014-01-24 DIAGNOSIS — R112 Nausea with vomiting, unspecified: Secondary | ICD-10-CM | POA: Diagnosis not present

## 2014-01-24 DIAGNOSIS — K2980 Duodenitis without bleeding: Secondary | ICD-10-CM | POA: Diagnosis present

## 2014-01-24 DIAGNOSIS — D72829 Elevated white blood cell count, unspecified: Secondary | ICD-10-CM | POA: Insufficient documentation

## 2014-01-24 DIAGNOSIS — D0511 Intraductal carcinoma in situ of right breast: Secondary | ICD-10-CM | POA: Diagnosis present

## 2014-01-24 DIAGNOSIS — R197 Diarrhea, unspecified: Secondary | ICD-10-CM | POA: Diagnosis present

## 2014-01-24 DIAGNOSIS — K625 Hemorrhage of anus and rectum: Secondary | ICD-10-CM | POA: Diagnosis present

## 2014-01-24 DIAGNOSIS — K567 Ileus, unspecified: Secondary | ICD-10-CM | POA: Diagnosis not present

## 2014-01-24 DIAGNOSIS — Z853 Personal history of malignant neoplasm of breast: Secondary | ICD-10-CM

## 2014-01-24 DIAGNOSIS — Z170 Estrogen receptor positive status [ER+]: Secondary | ICD-10-CM

## 2014-01-24 DIAGNOSIS — C189 Malignant neoplasm of colon, unspecified: Secondary | ICD-10-CM | POA: Diagnosis present

## 2014-01-24 DIAGNOSIS — M549 Dorsalgia, unspecified: Secondary | ICD-10-CM | POA: Diagnosis not present

## 2014-01-24 DIAGNOSIS — D721 Eosinophilia: Secondary | ICD-10-CM | POA: Diagnosis not present

## 2014-01-24 DIAGNOSIS — Z452 Encounter for adjustment and management of vascular access device: Secondary | ICD-10-CM

## 2014-01-24 DIAGNOSIS — R9389 Abnormal findings on diagnostic imaging of other specified body structures: Secondary | ICD-10-CM | POA: Diagnosis present

## 2014-01-24 DIAGNOSIS — K922 Gastrointestinal hemorrhage, unspecified: Secondary | ICD-10-CM | POA: Diagnosis present

## 2014-01-24 DIAGNOSIS — C182 Malignant neoplasm of ascending colon: Secondary | ICD-10-CM | POA: Diagnosis present

## 2014-01-24 DIAGNOSIS — B9620 Unspecified Escherichia coli [E. coli] as the cause of diseases classified elsewhere: Secondary | ICD-10-CM | POA: Diagnosis present

## 2014-01-24 DIAGNOSIS — K921 Melena: Secondary | ICD-10-CM

## 2014-01-24 LAB — CBC WITH DIFFERENTIAL/PLATELET
BASOS PCT: 1 % (ref 0–1)
Basophils Absolute: 0.2 10*3/uL — ABNORMAL HIGH (ref 0.0–0.1)
EOS ABS: 8.1 10*3/uL — AB (ref 0.0–0.7)
Eosinophils Relative: 35 % — ABNORMAL HIGH (ref 0–5)
HCT: 33.8 % — ABNORMAL LOW (ref 36.0–46.0)
HEMOGLOBIN: 10.7 g/dL — AB (ref 12.0–15.0)
LYMPHS ABS: 3.8 10*3/uL (ref 0.7–4.0)
Lymphocytes Relative: 16 % (ref 12–46)
MCH: 25.4 pg — AB (ref 26.0–34.0)
MCHC: 31.7 g/dL (ref 30.0–36.0)
MCV: 80.3 fL (ref 78.0–100.0)
MONO ABS: 0.9 10*3/uL (ref 0.1–1.0)
Monocytes Relative: 4 % (ref 3–12)
Neutro Abs: 10.1 10*3/uL — ABNORMAL HIGH (ref 1.7–7.7)
Neutrophils Relative %: 44 % (ref 43–77)
Platelets: 172 10*3/uL (ref 150–400)
RBC: 4.21 MIL/uL (ref 3.87–5.11)
RDW: 16.2 % — ABNORMAL HIGH (ref 11.5–15.5)
WBC: 23.1 10*3/uL — ABNORMAL HIGH (ref 4.0–10.5)

## 2014-01-24 LAB — BASIC METABOLIC PANEL
Anion gap: 8 (ref 5–15)
BUN: 21 mg/dL (ref 6–23)
CO2: 25 mmol/L (ref 19–32)
CREATININE: 0.72 mg/dL (ref 0.50–1.10)
Calcium: 8.9 mg/dL (ref 8.4–10.5)
Chloride: 108 mEq/L (ref 96–112)
GFR calc non Af Amer: 76 mL/min — ABNORMAL LOW (ref >90)
GFR, EST AFRICAN AMERICAN: 88 mL/min — AB (ref >90)
GLUCOSE: 112 mg/dL — AB (ref 70–99)
Potassium: 3.8 mmol/L (ref 3.5–5.1)
Sodium: 141 mmol/L (ref 135–145)

## 2014-01-24 LAB — ABO/RH: ABO/RH(D): B NEG

## 2014-01-24 LAB — I-STAT CHEM 8, ED
BUN: 18 mg/dL (ref 6–23)
Calcium, Ion: 1.16 mmol/L (ref 1.13–1.30)
Chloride: 105 mEq/L (ref 96–112)
Creatinine, Ser: 0.8 mg/dL (ref 0.50–1.10)
Glucose, Bld: 144 mg/dL — ABNORMAL HIGH (ref 70–99)
HCT: 33 % — ABNORMAL LOW (ref 36.0–46.0)
HEMOGLOBIN: 11.2 g/dL — AB (ref 12.0–15.0)
Potassium: 3.8 mmol/L (ref 3.5–5.1)
SODIUM: 139 mmol/L (ref 135–145)
TCO2: 18 mmol/L (ref 0–100)

## 2014-01-24 MED ORDER — SODIUM CHLORIDE 0.9 % IV BOLUS (SEPSIS)
1000.0000 mL | Freq: Once | INTRAVENOUS | Status: AC
Start: 2014-01-24 — End: 2014-01-24
  Administered 2014-01-24: 1000 mL via INTRAVENOUS

## 2014-01-24 MED ORDER — IOHEXOL 300 MG/ML  SOLN
25.0000 mL | Freq: Once | INTRAMUSCULAR | Status: AC | PRN
  Administered 2014-01-24: 25 mL via ORAL

## 2014-01-24 MED ORDER — IOHEXOL 300 MG/ML  SOLN
100.0000 mL | Freq: Once | INTRAMUSCULAR | Status: AC | PRN
  Administered 2014-01-24: 100 mL via INTRAVENOUS

## 2014-01-24 NOTE — ED Notes (Signed)
Pt has had about 5 episodes rectal bleeding since arrival in ED. PA aware.

## 2014-01-24 NOTE — ED Notes (Signed)
Per EMS: Pt started having rectal bleeding with bowel movements about 2 hours ago. Denies pain, no dizziness or lightheadedness. No hx of rectal bleeding. A&O x 4. Ambulatory.

## 2014-01-24 NOTE — Progress Notes (Signed)
CSW met with patient at bedside. Family was present. Per notes, patient is here because of episodes of rectal bleeding. Patient informed CSW that she comes from [facility] and lives in the [place] living unit. Patient states she is able to complete her own ADL's.  Husband/Jim Rautio [phone] Daughter/ Williemae Area [phone]  Willette Brace [phone] ED CSW 01/24/2014 9:13 PM

## 2014-01-24 NOTE — ED Notes (Signed)
Bed: WA24 Expected date:  Expected time:  Means of arrival:  Comments: EMS/rectal bleed 

## 2014-01-24 NOTE — H&P (Addendum)
PCP: Tommy Medal, MD  Oncology : [facility]  Chief Complaint:  Blood in stool   HPI: April [name] is a 80 y.o. female   has a past medical history of Hypertension; History of breast cancer (2011); Senile osteoporosis; and Cancer.   Presented with  Red blood stools since 3 PM since her arrival to ER she have had about 10-15 bloody BM's about hand full each time.  Patient states that the frequency of the bloody BM's is increasing.  She reports some diarrhea for the past few months though to be a result of cholecystectomy no abdominal pain, denies any chest pain, shortness of breath or lightheadedness. For the past 1 hour she has been feeling light headed when standing up. Patient reports taking iron and have been having some dark stools in the past. Patient is actively followed by oncology for history of stage 0 breast cancer which was treated by lumpectomy. She is currently not on any anticoagulation reports a last colonoscopy 4 years ago in 2011 and was unremarkable. She is followed by Dr. Paulita Fujita In ER she was noted to have elevated WBC up to 23 but family reports hx of increased WBC for the past 1 year mainly with increased eosinophils.  CT scan of the abdomen was ordered showing Active hemorrhage into the right colon, suggesting angiodysplasia. And Abnormal endometrial cavity which could reflect neoplasia.  Regarding colonic hemorrhage ER have spoken to Dr. Paulita Fujita who recommends admission and he will see her in the consult tomorrow. If patient decompensates radiology consult for angiogram and embolization. At this point patient is stable.  She has been having some chronic cough that has gotten worse, no fever or chills.    Hospitalist was called for admission for colonic hemorhage  Review of Systems:    Pertinent positives include:  fatigue, blood in stool,   Constitutional:  No weight loss, night sweats, Fevers, chills weight loss  HEENT:  No headaches, Difficulty  swallowing,Tooth/dental problems,Sore throat,  No sneezing, itching, ear ache, nasal congestion, post nasal drip,  Cardio-vascular:  No chest pain, Orthopnea, PND, anasarca, dizziness, palpitations.no Bilateral lower extremity swelling  GI:  No heartburn, indigestion, abdominal pain, nausea, vomiting, diarrhea, change in bowel habits, loss of appetite, melena, hematemesis Resp:  no shortness of breath at rest. No dyspnea on exertion, No excess mucus, no productive cough, No non-productive cough, No coughing up of blood.No change in color of mucus.No wheezing. Skin:  no rash or lesions. No jaundice GU:  no dysuria, change in color of urine, no urgency or frequency. No straining to urinate.  No flank pain.  Musculoskeletal:  No joint pain or no joint swelling. No decreased range of motion. No back pain.  Psych:  No change in mood or affect. No depression or anxiety. No memory loss.  Neuro: no localizing neurological complaints, no tingling, no weakness, no double vision, no gait abnormality, no slurred speech, no confusion  Otherwise ROS are negative except for above, 10 systems were reviewed  Past Medical History: Past Medical History  Diagnosis Date  . Hypertension   . History of breast cancer 2011    DCIS, Right breast. No radiation, took Aromasen 2012  . Senile osteoporosis     Took Fosamax x15years  . Cancer    Past Surgical History  Procedure Laterality Date  . Laparoscopic cholecystectomy  05/26/2009  . Breast lumpectomy  12/24/2009    Right, Dr Margot Chimes  . Cataract extraction w/ intraocular lens  implant, bilateral Bilateral  [id number]  . Tonsillectomy and adenoidectomy       Medications: Prior to Admission medications   Medication Sig Start Date End Date Taking? Authorizing Provider  acetaminophen (TYLENOL) 500 MG tablet Take 500 mg by mouth every 6 (six) hours as needed for pain (pain).   Yes Historical Provider, MD  calcium carbonate (OS-CAL) 600 MG TABS Take 600 mg  by mouth daily.    Yes Historical Provider, MD  Cetirizine HCl (ZYRTEC PO) Take 1 tablet by mouth at bedtime.    Yes Historical Provider, MD  cholecalciferol (VITAMIN D) 1000 UNITS tablet Take 4,000 Units by mouth at bedtime.    Yes Historical Provider, MD  colestipol (MICRONIZED COLESTIPOL HCL) 1 G tablet Take 1 g by mouth 2 (two) times daily.   Yes Historical Provider, MD  Diphenoxylate-Atropine (LOMOTIL PO) Take 1 tablet by mouth daily as needed (loose stools).    Yes Historical Provider, MD  IRON PO Take 1 tablet by mouth at bedtime.    Yes Historical Provider, MD  magnesium gluconate (MAGONATE) 500 MG tablet Take 500 mg by mouth 2 (two) times daily.   Yes Historical Provider, MD  Multiple Vitamins-Minerals (MULTIVITAMIN WITH MINERALS) tablet Take 1 tablet by mouth daily.   Yes Historical Provider, MD  metroNIDAZOLE (METROGEL) 0.75 % gel Apply 1 application topically every morning.    Historical Provider, MD  mometasone (NASONEX) 50 MCG/ACT nasal spray Place 2 sprays into the nose daily.      Historical Provider, MD  Wheat Dextrin (BENEFIBER PO) Take by mouth 2 (two) times daily.    Historical Provider, MD    Allergies:   Allergies  Allergen Reactions  . Codeine   . Sulfa Antibiotics     Social History:  Ambulatory   independently   Lives at home  With family     reports that she has never smoked. She has never used smokeless tobacco. She reports that she drinks alcohol. She reports that she does not use illicit drugs.    Family History: family history includes Cancer in her paternal aunt; Diabetes in her father; Heart disease in her father and mother.    Physical Exam: Patient Vitals for the past 24 hrs:  BP Temp Temp src Pulse Resp SpO2  01/24/14 2247 147/79 mmHg - - 115 18 100 %  01/24/14 2021 (!) 119/48 mmHg - - 93 20 98 %  01/24/14 1748 (!) 137/44 mmHg 97.7 F (36.5 C) Oral 100 18 98 %    1. General:  in No Acute distress 2. Psychological: Alert and  Oriented 3.  Head/ENT:     Dry Mucous Membranes                          Head Non traumatic, neck supple                          Normal  Dentition 4. SKIN: decreased Skin turgor,  Skin clean Dry and intact no rash 5. Heart: Regular rate and rhythm no Murmur, Rub or gallop 6. Lungs: Clear to auscultation bilaterally, no wheezes or crackles   7. Abdomen: Soft, non-tender, Non distended 8. Lower extremities: no clubbing, cyanosis, or edema 9. Neurologically Grossly intact, moving all 4 extremities equally 10. MSK: Normal range of motion  body mass index is unknown because there is no weight on file.   Labs on Admission:   Results for orders placed or performed  during the hospital encounter of 01/24/14 (from the past 24 hour(s))  ABO/Rh     Status: None   Collection Time: 01/24/14  6:34 PM  Result Value Ref Range   ABO/RH(D) B NEG   CBC with Differential     Status: Abnormal   Collection Time: 01/24/14  6:37 PM  Result Value Ref Range   WBC 23.1 (H) 4.0 - 10.5 K/uL   RBC 4.21 3.87 - 5.11 MIL/uL   Hemoglobin 10.7 (L) 12.0 - 15.0 g/dL   HCT 33.8 (L) 36.0 - 46.0 %   MCV 80.3 78.0 - 100.0 fL   MCH 25.4 (L) 26.0 - 34.0 pg   MCHC 31.7 30.0 - 36.0 g/dL   RDW 16.2 (H) 11.5 - 15.5 %   Platelets 172 150 - 400 K/uL   Neutrophils Relative % 44 43 - 77 %   Neutro Abs 10.1 (H) 1.7 - 7.7 K/uL   Lymphocytes Relative 16 12 - 46 %   Lymphs Abs 3.8 0.7 - 4.0 K/uL   Monocytes Relative 4 3 - 12 %   Monocytes Absolute 0.9 0.1 - 1.0 K/uL   Eosinophils Relative 35 (H) 0 - 5 %   Eosinophils Absolute 8.1 (H) 0.0 - 0.7 K/uL   Basophils Relative 1 0 - 1 %   Basophils Absolute 0.2 (H) 0.0 - 0.1 K/uL  Basic metabolic panel     Status: Abnormal   Collection Time: 01/24/14  6:37 PM  Result Value Ref Range   Sodium 141 135 - 145 mmol/L   Potassium 3.8 3.5 - 5.1 mmol/L   Chloride 108 96 - 112 mEq/L   CO2 25 19 - 32 mmol/L   Glucose, Bld 112 (H) 70 - 99 mg/dL   BUN 21 6 - 23 mg/dL   Creatinine, Ser 0.72 0.50 - 1.10  mg/dL   Calcium 8.9 8.4 - 10.5 mg/dL   GFR calc non Af Amer 76 (L) >90 mL/min   GFR calc Af Amer 88 (L) >90 mL/min   Anion gap 8 5 - 15  Type and screen     Status: None   Collection Time: 01/24/14  6:37 PM  Result Value Ref Range   ABO/RH(D) B NEG    Antibody Screen NEG    Sample Expiration 01/27/2014   I-Stat Chem 8, ED     Status: Abnormal   Collection Time: 01/24/14 11:20 PM  Result Value Ref Range   Sodium 139 135 - 145 mmol/L   Potassium 3.8 3.5 - 5.1 mmol/L   Chloride 105 96 - 112 mEq/L   BUN 18 6 - 23 mg/dL   Creatinine, Ser 0.80 0.50 - 1.10 mg/dL   Glucose, Bld 144 (H) 70 - 99 mg/dL   Calcium, Ion 1.16 1.13 - 1.30 mmol/L   TCO2 18 0 - 100 mmol/L   Hemoglobin 11.2 (L) 12.0 - 15.0 g/dL   HCT 33.0 (L) 36.0 - 46.0 %    UA  not obtained will order  No results found for: HGBA1C  CrCl cannot be calculated (Unknown ideal weight.).  BNP (last 3 results) No results for input(s): PROBNP in the last 8760 hours.  Other results:  I have pearsonaly reviewed this: ECG  Not obtained  There were no vitals filed for this visit.   Cultures: No results found for: SDES, [place], CULT, REPTSTATUS   Radiological Exams on Admission: Ct Abdomen Pelvis W Contrast  01/24/2014   CLINICAL DATA:  Hematochezia.  EXAM: CT ABDOMEN AND PELVIS WITH  CONTRAST  TECHNIQUE: Multidetector CT imaging of the abdomen and pelvis was performed using the standard protocol following bolus administration of intravenous contrast.  CONTRAST:  171mL OMNIPAQUE IOHEXOL 300 MG/ML SOLN, 61mL OMNIPAQUE IOHEXOL 300 MG/ML SOLN  COMPARISON:  10/25/2013  FINDINGS: BODY WALL: Unremarkable.  LOWER CHEST: Unremarkable.  ABDOMEN/PELVIS:  Liver: No focal abnormality.  Biliary: Cholecystectomy. No evidence of biliary of obstruction or calculus.  Pancreas: Unremarkable.  Spleen: Unremarkable.  Adrenals: Unremarkable.  Kidneys and ureters: No hydronephrosis.  Left renal sinus cysts.  Bladder: Unremarkable.  Reproductive: As  noted on the previous study, there is low-density expansion of the atrophic uterus. The endometrial cavity or internal fluid measures 21 mm in AP dimension. This could be from endometrial neoplasia or cervical stenosis with trapped fluid. Ultrasound has been previously recommended. The ovaries are negative.  Bowel: There is high-density material within the lumen of the proximal colon. Oral contrast has not yet reached this segment of bowel, and the finding is compatible with hemorrhage into the lumen, in this location usually from angiodysplasia. The volume increases on delayed imaging. Colonic diverticulosis present in the sigmoid region, which could also explain hematochezia. Increased fluid content of bowel which could reflect diarrhea related to the GI bleed. Negative appendix. In the region of high-density material, there is questionable thickening of the wall, but a mass lesion cannot be called in the setting of diffuse stool/fluid. No mesenteric adenopathy.  Retroperitoneum: No mass or adenopathy.  Peritoneum: No ascites or pneumoperitoneum.  Vascular: No acute abnormality.  OSSEOUS: Facet arthropathy with L4-5 slip. Remote L1 inferior endplate fracture. This numbering assumes a transitional lumbosacral vertebra as S1.  These results were called by telephone at the time of interpretation on 01/24/2014 at 10:40 pm to Dr. Montine Circle , who verbally acknowledged these results.  IMPRESSION: 1. Active hemorrhage into the right colon, suggesting angiodysplasia. 2. Colonic diverticulosis. 3. Abnormal endometrial cavity which could reflect neoplasia. Sonography has been previously recommended.   Electronically Signed   By: Jorje Guild M.D.   On: 01/24/2014 22:40    Chart has been reviewed  Assessment/Plan  80 year old female presents with bright red blood per rectum CT scan noted for right colon hemorrhage. Patient's having vital sign changes and symptomatic anemia with frequent bloody bowel movements.  Case has been discussed with interventional radiology Dr. Pascal Lux and GI doctor [facility]  Present on Admission:  . Colonic hemorrhage - given continuous bleeding. Discussed case with interventional radiology.. Their recommendation this point we'll order a nuclear medicine bleeding scan. From there proceeded to angiography and emobolization if needed. Gi is aware. We'll admit to step down. IV access 2 at least. IV fluids. Discussed case with PCCM for e-link monitoring  . Anemia -  no significant changes but be given brisk nature of bleeding anticipate drop in hemoglobin. Patient has become symptomatic will order 2 units of packed red blood cells. Monitor in step down serial CBC  . Thickened endometrium - this needs to be followed up as an outpatient or once patient active issue has been stabilized. With a vaginal ultrasound    Prophylaxis: SCD , Protonix  CODE STATUS:  FULL CODE   Other plan as per orders.  I have spent a total of  65 min on this admission  Azlin Zilberman 01/24/2014, 11:43 PM  [facility]  Pager [phone]   after 2 AM please page floor coverage PA If 7AM-7PM, please contact the day team taking care of the patient  [email or web address]  Password [email or web address]

## 2014-01-24 NOTE — ED Provider Notes (Signed)
CSN: [id number]     Arrival date & time 01/24/14  1746 History   First MD Initiated Contact with Patient 01/24/14 1749     Chief Complaint  Patient presents with  . Rectal Bleeding     (Consider location/radiation/quality/duration/timing/severity/associated sxs/prior Treatment) HPI Comments: Patient with past medical history of hypertension presents to the emergency department with chief complaint of rectal bleeding. She states that she has had dark black stools for several weeks, but today they turned red. She also reports having associated diarrhea. She denies any abdominal pain. Denies any chest pain, shortness of breath, dizziness, lightheadedness, or any other symptoms at this time. She has been taking iron supplements, but has not taken anything specifically for the bright red stools. There are no aggravating or relieving factors. Patient does not take any blood thinners. She states that her last colonoscopy was about 4 years ago, and was normal.  The history is provided by the patient. No language interpreter was used.    Past Medical History  Diagnosis Date  . Hypertension   . History of breast cancer 2011    DCIS, Right breast. No radiation, took Aromasen 2012  . Senile osteoporosis     Took Fosamax x15years  . Cancer    Past Surgical History  Procedure Laterality Date  . Laparoscopic cholecystectomy  05/26/2009  . Breast lumpectomy  12/24/2009    Right, Dr Margot Chimes  . Cataract extraction w/ intraocular lens  implant, bilateral Bilateral [id number]  . Tonsillectomy and adenoidectomy     Family History  Problem Relation Age of Onset  . Heart disease Mother   . Heart disease Father   . Diabetes Father   . Cancer Paternal Aunt     breast   History  Substance Use Topics  . Smoking status: Never Smoker   . Smokeless tobacco: Never Used  . Alcohol Use: Yes   OB History    No data available     Review of Systems  Constitutional: Negative for fever and chills.   Respiratory: Negative for shortness of breath.   Cardiovascular: Negative for chest pain.  Gastrointestinal: Positive for blood in stool. Negative for nausea, vomiting, diarrhea and constipation.  Genitourinary: Negative for dysuria.  All other systems reviewed and are negative.     Allergies  Codeine; Penicillins; and Sulfa antibiotics  Home Medications   Prior to Admission medications   Medication Sig Start Date End Date Taking? Authorizing Provider  acetaminophen (TYLENOL) 500 MG tablet Take 500 mg by mouth every 6 (six) hours as needed for pain (pain).    Historical Provider, MD  calcium carbonate (OS-CAL) 600 MG TABS Take 600 mg by mouth daily. 630 mg daily    Historical Provider, MD  Cetirizine HCl (ZYRTEC PO) Take 1 tablet by mouth every morning.    Historical Provider, MD  cholecalciferol (VITAMIN D) 1000 UNITS tablet Take 4,000 Units by mouth daily.     Historical Provider, MD  Diphenoxylate-Atropine (LOMOTIL PO) Take by mouth as needed.    Historical Provider, MD  halobetasol (ULTRAVATE) 0.05 % cream Apply topically every morning. Pt uses as needed    Historical Provider, MD  IRON PO Take by mouth.    Historical Provider, MD  metroNIDAZOLE (METROGEL) 0.75 % gel Apply 1 application topically every morning.    Historical Provider, MD  mometasone (NASONEX) 50 MCG/ACT nasal spray Place 2 sprays into the nose daily.      Historical Provider, MD  Multiple Vitamins-Minerals (MULTIVITAMIN WITH MINERALS) tablet Take  1 tablet by mouth daily.    Historical Provider, MD  Wheat Dextrin (BENEFIBER PO) Take by mouth 2 (two) times daily.    Historical Provider, MD   BP 137/44 mmHg  Pulse 100  Temp(Src) 97.7 F (36.5 C) (Oral)  Resp 18  SpO2 98% Physical Exam  Constitutional: She is oriented to person, place, and time. She appears well-developed and well-nourished.  HENT:  Head: Normocephalic and atraumatic.  Eyes: Conjunctivae and EOM are normal. Pupils are equal, round, and  reactive to light.  Neck: Normal range of motion. Neck supple.  Cardiovascular: Normal rate and regular rhythm.  Exam reveals no gallop and no friction rub.   No murmur heard. Pulmonary/Chest: Effort normal and breath sounds normal. No respiratory distress. She has no wheezes. She has no rales. She exhibits no tenderness.  Abdominal: Soft. Bowel sounds are normal. She exhibits no distension and no mass. There is no tenderness. There is no rebound and no guarding.  Genitourinary:  Chaperone present for exam, empty rectal vault, no palpable internal or external hemorrhoids, scant blood on gloved finger.  FOBT positive  Musculoskeletal: Normal range of motion. She exhibits no edema or tenderness.  Neurological: She is alert and oriented to person, place, and time.  Skin: Skin is warm and dry.  Psychiatric: She has a normal mood and affect. Her behavior is normal. Judgment and thought content normal.  Nursing note and vitals reviewed.   ED Course  Procedures (including critical care time) Results for orders placed or performed during the hospital encounter of 01/24/14  CBC with Differential  Result Value Ref Range   WBC 23.1 (H) 4.0 - 10.5 K/uL   RBC 4.21 3.87 - 5.11 MIL/uL   Hemoglobin 10.7 (L) 12.0 - 15.0 g/dL   HCT 33.8 (L) 36.0 - 46.0 %   MCV 80.3 78.0 - 100.0 fL   MCH 25.4 (L) 26.0 - 34.0 pg   MCHC 31.7 30.0 - 36.0 g/dL   RDW 16.2 (H) 11.5 - 15.5 %   Platelets 172 150 - 400 K/uL   Neutrophils Relative % 44 43 - 77 %   Neutro Abs 10.1 (H) 1.7 - 7.7 K/uL   Lymphocytes Relative 16 12 - 46 %   Lymphs Abs 3.8 0.7 - 4.0 K/uL   Monocytes Relative 4 3 - 12 %   Monocytes Absolute 0.9 0.1 - 1.0 K/uL   Eosinophils Relative 35 (H) 0 - 5 %   Eosinophils Absolute 8.1 (H) 0.0 - 0.7 K/uL   Basophils Relative 1 0 - 1 %   Basophils Absolute 0.2 (H) 0.0 - 0.1 K/uL  Basic metabolic panel  Result Value Ref Range   Sodium 141 135 - 145 mmol/L   Potassium 3.8 3.5 - 5.1 mmol/L   Chloride 108 96  - 112 mEq/L   CO2 25 19 - 32 mmol/L   Glucose, Bld 112 (H) 70 - 99 mg/dL   BUN 21 6 - 23 mg/dL   Creatinine, Ser 0.72 0.50 - 1.10 mg/dL   Calcium 8.9 8.4 - 10.5 mg/dL   GFR calc non Af Amer 76 (L) >90 mL/min   GFR calc Af Amer 88 (L) >90 mL/min   Anion gap 8 5 - 15  I-Stat Chem 8, ED  Result Value Ref Range   Sodium 139 135 - 145 mmol/L   Potassium 3.8 3.5 - 5.1 mmol/L   Chloride 105 96 - 112 mEq/L   BUN 18 6 - 23 mg/dL   Creatinine, Ser  0.80 0.50 - 1.10 mg/dL   Glucose, Bld 144 (H) 70 - 99 mg/dL   Calcium, Ion 1.16 1.13 - 1.30 mmol/L   TCO2 18 0 - 100 mmol/L   Hemoglobin 11.2 (L) 12.0 - 15.0 g/dL   HCT 33.0 (L) 36.0 - 46.0 %  Type and screen  Result Value Ref Range   ABO/RH(D) B NEG    Antibody Screen NEG    Sample Expiration 01/27/2014   ABO/Rh  Result Value Ref Range   ABO/RH(D) B NEG    Ct Abdomen Pelvis W Contrast  01/24/2014   CLINICAL DATA:  Hematochezia.  EXAM: CT ABDOMEN AND PELVIS WITH CONTRAST  TECHNIQUE: Multidetector CT imaging of the abdomen and pelvis was performed using the standard protocol following bolus administration of intravenous contrast.  CONTRAST:  146mL OMNIPAQUE IOHEXOL 300 MG/ML SOLN, 34mL OMNIPAQUE IOHEXOL 300 MG/ML SOLN  COMPARISON:  10/25/2013  FINDINGS: BODY WALL: Unremarkable.  LOWER CHEST: Unremarkable.  ABDOMEN/PELVIS:  Liver: No focal abnormality.  Biliary: Cholecystectomy. No evidence of biliary of obstruction or calculus.  Pancreas: Unremarkable.  Spleen: Unremarkable.  Adrenals: Unremarkable.  Kidneys and ureters: No hydronephrosis.  Left renal sinus cysts.  Bladder: Unremarkable.  Reproductive: As noted on the previous study, there is low-density expansion of the atrophic uterus. The endometrial cavity or internal fluid measures 21 mm in AP dimension. This could be from endometrial neoplasia or cervical stenosis with trapped fluid. Ultrasound has been previously recommended. The ovaries are negative.  Bowel: There is high-density material  within the lumen of the proximal colon. Oral contrast has not yet reached this segment of bowel, and the finding is compatible with hemorrhage into the lumen, in this location usually from angiodysplasia. The volume increases on delayed imaging. Colonic diverticulosis present in the sigmoid region, which could also explain hematochezia. Increased fluid content of bowel which could reflect diarrhea related to the GI bleed. Negative appendix. In the region of high-density material, there is questionable thickening of the wall, but a mass lesion cannot be called in the setting of diffuse stool/fluid. No mesenteric adenopathy.  Retroperitoneum: No mass or adenopathy.  Peritoneum: No ascites or pneumoperitoneum.  Vascular: No acute abnormality.  OSSEOUS: Facet arthropathy with L4-5 slip. Remote L1 inferior endplate fracture. This numbering assumes a transitional lumbosacral vertebra as S1.  These results were called by telephone at the time of interpretation on 01/24/2014 at 10:40 pm to Dr. Montine Circle , who verbally acknowledged these results.  IMPRESSION: 1. Active hemorrhage into the right colon, suggesting angiodysplasia. 2. Colonic diverticulosis. 3. Abnormal endometrial cavity which could reflect neoplasia. Sonography has been previously recommended.   Electronically Signed   By: Jorje Guild M.D.   On: 01/24/2014 22:40      EKG Interpretation None      MDM   Final diagnoses:  Hematochezia  Rectal bleeding  Hemorrhage of colon    Patient with probable GI bleed, no evidence of hemorrhoids. Will check labs, give fluids, and check Hemoccult. Patient is currently stable.  Informed by nursing staff that the patient has had 3 additional loose stools with blood.  Patient seen by and discussed with Dr. Mingo Amber, who recommends CT abd.  Patient still getting fluids and is comfortable appearing.  Received call from radiology, CT remarkable for active hemorrhage from right colon.  Discussed  immediately with Dr. Mingo Amber, who recommends GI consult and hospitalist admit.  11:11 PM Patient discussed with Dr. Paulita Fujita, who recommends hospitalist admission.  States that while she is having active  bleeding, colonoscopy cannot be performed.  If the patient becomes unstable, she will require interventional radiology angiogram.    Montine Circle, PA-C 01/25/14 0032  Evelina Bucy, MD 01/25/14 0100

## 2014-01-25 ENCOUNTER — Inpatient Hospital Stay (HOSPITAL_COMMUNITY): Payer: Medicare Other

## 2014-01-25 ENCOUNTER — Encounter (HOSPITAL_COMMUNITY): Payer: Self-pay | Admitting: Internal Medicine

## 2014-01-25 DIAGNOSIS — D62 Acute posthemorrhagic anemia: Secondary | ICD-10-CM

## 2014-01-25 DIAGNOSIS — R938 Abnormal findings on diagnostic imaging of other specified body structures: Secondary | ICD-10-CM

## 2014-01-25 DIAGNOSIS — K922 Gastrointestinal hemorrhage, unspecified: Secondary | ICD-10-CM

## 2014-01-25 LAB — CBC
HCT: 30.6 % — ABNORMAL LOW (ref 36.0–46.0)
HCT: 29 % — ABNORMAL LOW (ref 36.0–46.0)
HEMATOCRIT: 26.4 % — AB (ref 36.0–46.0)
HEMOGLOBIN: 10.1 g/dL — AB (ref 12.0–15.0)
Hemoglobin: 8.7 g/dL — ABNORMAL LOW (ref 12.0–15.0)
Hemoglobin: 9.4 g/dL — ABNORMAL LOW (ref 12.0–15.0)
MCH: 27.3 pg (ref 26.0–34.0)
MCH: 27.4 pg (ref 26.0–34.0)
MCH: 27.3 pg (ref 26.0–34.0)
MCHC: 33 g/dL (ref 30.0–36.0)
MCHC: 33 g/dL (ref 30.0–36.0)
MCHC: 32.4 g/dL (ref 30.0–36.0)
MCV: 82.7 fL (ref 78.0–100.0)
MCV: 83.3 fL (ref 78.0–100.0)
MCV: 84.3 fL (ref 78.0–100.0)
PLATELETS: 131 10*3/uL — AB (ref 150–400)
Platelets: 130 10*3/uL — ABNORMAL LOW (ref 150–400)
Platelets: 140 10*3/uL — ABNORMAL LOW (ref 150–400)
RBC: 3.7 MIL/uL — ABNORMAL LOW (ref 3.87–5.11)
RBC: 3.17 MIL/uL — ABNORMAL LOW (ref 3.87–5.11)
RBC: 3.44 MIL/uL — ABNORMAL LOW (ref 3.87–5.11)
RDW: 15.4 % (ref 11.5–15.5)
RDW: 15.6 % — ABNORMAL HIGH (ref 11.5–15.5)
RDW: 15.7 % — ABNORMAL HIGH (ref 11.5–15.5)
WBC: 23.2 10*3/uL — ABNORMAL HIGH (ref 4.0–10.5)
WBC: 17.4 10*3/uL — AB (ref 4.0–10.5)
WBC: 19.9 10*3/uL — AB (ref 4.0–10.5)

## 2014-01-25 LAB — URINALYSIS, ROUTINE W REFLEX MICROSCOPIC
BILIRUBIN URINE: NEGATIVE
GLUCOSE, UA: NEGATIVE mg/dL
Glucose, UA: NEGATIVE mg/dL
HGB URINE DIPSTICK: NEGATIVE
KETONES UR: 40 mg/dL — AB
KETONES UR: NEGATIVE mg/dL
Leukocytes, UA: NEGATIVE
NITRITE: NEGATIVE
Nitrite: POSITIVE — AB
Protein, ur: >300 mg/dL — AB
Protein, ur: NEGATIVE mg/dL
Specific Gravity, Urine: 1.035 — ABNORMAL HIGH (ref 1.005–1.030)
Specific Gravity, Urine: 1.034 — ABNORMAL HIGH (ref 1.005–1.030)
UROBILINOGEN UA: 1 mg/dL (ref 0.0–1.0)
Urobilinogen, UA: 0.2 mg/dL (ref 0.0–1.0)
pH: 5 (ref 5.0–8.0)
pH: 5 (ref 5.0–8.0)

## 2014-01-25 LAB — COMPREHENSIVE METABOLIC PANEL
ALK PHOS: 45 U/L (ref 39–117)
ALT: 12 U/L (ref 0–35)
AST: 16 U/L (ref 0–37)
Albumin: 3.2 g/dL — ABNORMAL LOW (ref 3.5–5.2)
Anion gap: 5 (ref 5–15)
BUN: 13 mg/dL (ref 6–23)
CHLORIDE: 112 meq/L (ref 96–112)
CO2: 23 mmol/L (ref 19–32)
Calcium: 7.8 mg/dL — ABNORMAL LOW (ref 8.4–10.5)
Creatinine, Ser: 0.63 mg/dL (ref 0.50–1.10)
GFR calc Af Amer: >90 mL/min (ref >90)
GFR, EST NON AFRICAN AMERICAN: 79 mL/min — AB (ref >90)
Glucose, Bld: 117 mg/dL — ABNORMAL HIGH (ref 70–99)
Potassium: 4.1 mmol/L (ref 3.5–5.1)
SODIUM: 140 mmol/L (ref 135–145)
Total Bilirubin: 1 mg/dL (ref 0.3–1.2)
Total Protein: 4.9 g/dL — ABNORMAL LOW (ref 6.0–8.3)

## 2014-01-25 LAB — PROTIME-INR
INR: 1.29 (ref 0.00–1.49)
PROTHROMBIN TIME: 16.2 s — AB (ref 11.6–15.2)

## 2014-01-25 LAB — URINE MICROSCOPIC-ADD ON

## 2014-01-25 LAB — PREPARE RBC (CROSSMATCH)

## 2014-01-25 LAB — MAGNESIUM: MAGNESIUM: 1.8 mg/dL (ref 1.5–2.5)

## 2014-01-25 LAB — MRSA PCR SCREENING: MRSA by PCR: NEGATIVE

## 2014-01-25 LAB — PHOSPHORUS: Phosphorus: 3.2 mg/dL (ref 2.3–4.6)

## 2014-01-25 LAB — OCCULT BLOOD, POC DEVICE: Fecal Occult Bld: POSITIVE — AB

## 2014-01-25 LAB — TSH: TSH: 4.055 u[IU]/mL (ref 0.350–4.500)

## 2014-01-25 MED ORDER — DIPHENHYDRAMINE HCL 25 MG PO CAPS
25.0000 mg | ORAL_CAPSULE | Freq: Once | ORAL | Status: AC
  Administered 2014-01-25: 25 mg via ORAL
  Filled 2014-01-25: qty 1

## 2014-01-25 MED ORDER — ONDANSETRON HCL 4 MG PO TABS: 4.0000 mg | ORAL_TABLET | Freq: Four times a day (QID) | ORAL | Status: DC | PRN

## 2014-01-25 MED ORDER — MIDAZOLAM HCL 2 MG/2ML IJ SOLN
INTRAMUSCULAR | Status: AC
  Filled 2014-01-25: qty 6

## 2014-01-25 MED ORDER — LIDOCAINE HCL 1 % IJ SOLN
INTRAMUSCULAR | Status: AC
  Filled 2014-01-25: qty 20

## 2014-01-25 MED ORDER — FENTANYL CITRATE 0.05 MG/ML IJ SOLN
INTRAMUSCULAR | Status: AC
  Filled 2014-01-25: qty 4

## 2014-01-25 MED ORDER — ACETAMINOPHEN 325 MG PO TABS: 650.0000 mg | ORAL_TABLET | Freq: Four times a day (QID) | ORAL | Status: DC | PRN

## 2014-01-25 MED ORDER — SODIUM CHLORIDE 0.9 % IV SOLN: Freq: Once | INTRAVENOUS | Status: DC

## 2014-01-25 MED ORDER — IOHEXOL 300 MG/ML  SOLN
80.0000 mL | Freq: Once | INTRAMUSCULAR | Status: AC | PRN
  Administered 2014-01-25: 130 mL via INTRA_ARTERIAL

## 2014-01-25 MED ORDER — HYDROCODONE-ACETAMINOPHEN 5-325 MG PO TABS
1.0000 | ORAL_TABLET | ORAL | Status: DC | PRN
  Administered 2014-01-25: 1 via ORAL
  Filled 2014-01-25: qty 1

## 2014-01-25 MED ORDER — ONDANSETRON HCL 4 MG/2ML IJ SOLN
4.0000 mg | Freq: Four times a day (QID) | INTRAMUSCULAR | Status: DC | PRN
  Administered 2014-01-30 (×2): 4 mg via INTRAVENOUS
  Filled 2014-01-25 (×2): qty 2

## 2014-01-25 MED ORDER — CETYLPYRIDINIUM CHLORIDE 0.05 % MT LIQD
7.0000 mL | Freq: Four times a day (QID) | OROMUCOSAL | Status: DC
  Administered 2014-01-26 – 2014-01-31 (×12): 7 mL via OROMUCOSAL

## 2014-01-25 MED ORDER — SODIUM CHLORIDE 0.9 % IV BOLUS (SEPSIS)
500.0000 mL | Freq: Once | INTRAVENOUS | Status: AC
  Administered 2014-01-25: 500 mL via INTRAVENOUS

## 2014-01-25 MED ORDER — TECHNETIUM TC 99M-LABELED RED BLOOD CELLS IV KIT
21.0000 | PACK | Freq: Once | INTRAVENOUS | Status: AC | PRN
  Administered 2014-01-25: 21 via INTRAVENOUS

## 2014-01-25 MED ORDER — CHLORHEXIDINE GLUCONATE 0.12 % MT SOLN
15.0000 mL | Freq: Two times a day (BID) | OROMUCOSAL | Status: DC
  Administered 2014-01-25 – 2014-01-29 (×7): 15 mL via OROMUCOSAL
  Filled 2014-01-25 (×12): qty 15

## 2014-01-25 MED ORDER — FENTANYL CITRATE 0.05 MG/ML IJ SOLN
INTRAMUSCULAR | Status: AC | PRN
  Administered 2014-01-25 (×5): 25 ug via INTRAVENOUS

## 2014-01-25 MED ORDER — SODIUM CHLORIDE 0.9 % IJ SOLN
3.0000 mL | Freq: Two times a day (BID) | INTRAMUSCULAR | Status: DC
  Administered 2014-01-26 – 2014-01-29 (×8): 3 mL via INTRAVENOUS

## 2014-01-25 MED ORDER — ACETAMINOPHEN 325 MG PO TABS
650.0000 mg | ORAL_TABLET | Freq: Once | ORAL | Status: AC
Start: 2014-01-25 — End: 2014-01-25
  Administered 2014-01-25: 650 mg via ORAL
  Filled 2014-01-25: qty 2

## 2014-01-25 MED ORDER — ACETAMINOPHEN 650 MG RE SUPP: 650.0000 mg | Freq: Four times a day (QID) | RECTAL | Status: DC | PRN

## 2014-01-25 MED ORDER — MIDAZOLAM HCL 2 MG/2ML IJ SOLN
INTRAMUSCULAR | Status: AC | PRN
  Administered 2014-01-25: 0.5 mg via INTRAVENOUS
  Administered 2014-01-25: 1 mg via INTRAVENOUS
  Administered 2014-01-25 (×4): 0.5 mg via INTRAVENOUS
  Administered 2014-01-25: 1 mg via INTRAVENOUS

## 2014-01-25 MED ORDER — SODIUM CHLORIDE 0.9 % IV SOLN
INTRAVENOUS | Status: AC
  Administered 2014-01-25: 02:00:00 via INTRAVENOUS

## 2014-01-25 MED ORDER — PANTOPRAZOLE SODIUM 40 MG IV SOLR
40.0000 mg | Freq: Every day | INTRAVENOUS | Status: DC
  Administered 2014-01-25 – 2014-01-27 (×4): 40 mg via INTRAVENOUS
  Filled 2014-01-25 (×5): qty 40

## 2014-01-25 MED ORDER — CEFAZOLIN SODIUM-DEXTROSE 2-3 GM-% IV SOLR
INTRAVENOUS | Status: AC
  Administered 2014-01-25: 2000 mg
  Filled 2014-01-25: qty 50

## 2014-01-25 NOTE — Plan of Care (Addendum)
Pt admitted for GIB. Plan was for tagged red blood cell study tonight if pt unstable. Since moving to SDU from ED, pt has had only i stool, 100cc and bloody. She remains hemodynamically stable and is starting 2U transfusion now. Spoke to Dr. Pascal Lux of IR about tonight's plan of care. If pt remains stable tonight, will do tag study in am followed immediately by angio and embolization if necessary. Should pt worsen, have excessive bleeding, or become otherwise unstable, will call IR back prior to am.  Clance Boll NP [facility] Update: Pt has had a total of 450cc stool this shift and 175cc of urine, some of which has been blood tinged. BP remains stable and pt appears well. Just finished 2nd unit of blood. Next labs at 0630. Will continue to follow. Foley for accuracy and true evaluation of hematuria and UA sent to lab.  KJKG, NP Update: Shortly after above call from RN, pt had a large bloody BM with clots-300cc. Her BP and HR remain stable and she reports no dizziness. Given this event, this NP called IR Dr. Pascal Lux back to discuss plan. They will plan on getting tagged study this am and f/up with embolization as needed. Will report this to attending at [place] to be drawn shortly and will follow those. Went ahead and ordered another unit transfused given this last large bloody BM.  KJKG, NP Update: Previously sent UA was positive for large blood. This specimen may have been skewed by fecal matter. Will send new UA. KJKG, NP

## 2014-01-25 NOTE — Progress Notes (Addendum)
[facility] PROGRESS NOTE  April [name] KCL:[id number] DOB: May 01, 1925 DOA: 01/24/2014  PCP: Tommy Medal, MD  Brief HPI: 80 year old Caucasian female presented with rectal bleeding. She was transfused PRBCs. CT scan suggested bleeding from angiodysplasia. She was admitted for further management  Past medical history:  Past Medical History  Diagnosis Date  . Hypertension   . History of breast cancer 2011    DCIS, Right breast. No radiation, took Aromasen 2012  . Senile osteoporosis     Took Fosamax x15years  . Cancer     Consultants: Gastroenterology and interventional radiology  Procedures: None yet  Antibiotics: None  Subjective: Patient continues to have rectal bleeding. She denies any nausea, vomiting. Minimal abdominal discomfort.  Objective: Vital Signs  Filed Vitals:   01/25/14 0400 01/25/14 0430 01/25/14 0500 01/25/14 0700  BP: 104/40 135/42 118/38 104/32  Pulse: 83 82 79 83  Temp: 97.7 F (36.5 C) 97.8 F (36.6 C) 97.8 F (36.6 C)   TempSrc: Oral Oral Oral   Resp: 15 20 14 15   Height:      Weight:      SpO2: 96% 98% 96% 94%    Intake/Output Summary (Last 24 hours) at 01/25/14 0740 Last data filed at 01/25/14 0700  Gross per 24 hour  Intake   1800 ml  Output   1400 ml  Net    400 ml   Filed Weights   01/25/14 0100  Weight: 68.5 kg (151 lb 0.2 oz)    General appearance: alert, cooperative, appears stated age and no distress Head: Normocephalic, without obvious abnormality, atraumatic Resp: clear to auscultation bilaterally Cardio: regular rate and rhythm, S1, S2 normal, no murmur, click, rub or gallop GI: soft, non-tender; bowel sounds normal; no masses,  no organomegaly Extremities: extremities normal, atraumatic, no cyanosis or edema Neurologic: No focal deficits  Lab Results:  Basic Metabolic Panel:  Recent Labs Lab 01/24/14 1837 01/24/14 2320 01/25/14 0643  NA 141 139 140  K 3.8 3.8 4.1  CL 108 105 112  CO2 25  --   23  GLUCOSE 112* 144* 117*  BUN 21 18 13   CREATININE 0.72 0.80 0.63  CALCIUM 8.9  --  7.8*  MG  --   --  1.8  PHOS  --   --  3.2   Liver Function Tests:  Recent Labs Lab 01/25/14 0643  AST 16  ALT 12  ALKPHOS 45  BILITOT 1.0  PROT 4.9*  ALBUMIN 3.2*   CBC:  Recent Labs Lab 01/24/14 1837 01/24/14 2320 01/25/14 0643  WBC 23.1*  --  23.2*  NEUTROABS 10.1*  --   --   HGB 10.7* 11.2* 10.1*  HCT 33.8* 33.0* 30.6*  MCV 80.3  --  82.7  PLT 172  --  130*    Recent Results (from the past 240 hour(s))  MRSA PCR Screening     Status: None   Collection Time: 01/25/14 12:59 AM  Result Value Ref Range Status   MRSA by PCR NEGATIVE NEGATIVE Final    Comment:        The GeneXpert MRSA Assay (FDA approved for NASAL specimens only), is one component of a comprehensive MRSA colonization surveillance program. It is not intended to diagnose MRSA infection nor to guide or monitor treatment for MRSA infections.       Studies/Results: Ct Abdomen Pelvis W Contrast  01/24/2014   CLINICAL DATA:  Hematochezia.  EXAM: CT ABDOMEN AND PELVIS WITH CONTRAST  TECHNIQUE: Multidetector CT imaging of  the abdomen and pelvis was performed using the standard protocol following bolus administration of intravenous contrast.  CONTRAST:  122mL OMNIPAQUE IOHEXOL 300 MG/ML SOLN, 11mL OMNIPAQUE IOHEXOL 300 MG/ML SOLN  COMPARISON:  10/25/2013  FINDINGS: BODY WALL: Unremarkable.  LOWER CHEST: Unremarkable.  ABDOMEN/PELVIS:  Liver: No focal abnormality.  Biliary: Cholecystectomy. No evidence of biliary of obstruction or calculus.  Pancreas: Unremarkable.  Spleen: Unremarkable.  Adrenals: Unremarkable.  Kidneys and ureters: No hydronephrosis.  Left renal sinus cysts.  Bladder: Unremarkable.  Reproductive: As noted on the previous study, there is low-density expansion of the atrophic uterus. The endometrial cavity or internal fluid measures 21 mm in AP dimension. This could be from endometrial neoplasia or  cervical stenosis with trapped fluid. Ultrasound has been previously recommended. The ovaries are negative.  Bowel: There is high-density material within the lumen of the proximal colon. Oral contrast has not yet reached this segment of bowel, and the finding is compatible with hemorrhage into the lumen, in this location usually from angiodysplasia. The volume increases on delayed imaging. Colonic diverticulosis present in the sigmoid region, which could also explain hematochezia. Increased fluid content of bowel which could reflect diarrhea related to the GI bleed. Negative appendix. In the region of high-density material, there is questionable thickening of the wall, but a mass lesion cannot be called in the setting of diffuse stool/fluid. No mesenteric adenopathy.  Retroperitoneum: No mass or adenopathy.  Peritoneum: No ascites or pneumoperitoneum.  Vascular: No acute abnormality.  OSSEOUS: Facet arthropathy with L4-5 slip. Remote L1 inferior endplate fracture. This numbering assumes a transitional lumbosacral vertebra as S1.  These results were called by telephone at the time of interpretation on 01/24/2014 at 10:40 pm to Dr. Montine Circle , who verbally acknowledged these results.  IMPRESSION: 1. Active hemorrhage into the right colon, suggesting angiodysplasia. 2. Colonic diverticulosis. 3. Abnormal endometrial cavity which could reflect neoplasia. Sonography has been previously recommended.   Electronically Signed   By: Jorje Guild M.D.   On: 01/24/2014 22:40    Medications:  Scheduled: . sodium chloride   Intravenous Once  . sodium chloride   Intravenous Once  . antiseptic oral rinse  7 mL Mouth Rinse QID  . chlorhexidine  15 mL Mouth Rinse BID  . pantoprazole (PROTONIX) IV  40 mg Intravenous QHS  . sodium chloride  3 mL Intravenous Q12H   Continuous:  ZOX:[id number] **OR** acetaminophen, HYDROcodone-acetaminophen, ondansetron **OR** ondansetron (ZOFRAN) IV  Assessment/Plan:  Active  Problems:   hx: breast cancer, DCIS, right, receptor +   Colonic hemorrhage   Anemia   Thickened endometrium    Hematochezia  A nuclear RBC tagged scan is pending. Gastroenterology has been consulted along with interventional radiology. Continue to monitor CBCs closely. Continue to remain in step down unit. Patient denies being on aspirin or any other anticoagulants. Elevated WBC is noted. She is afebrile. No clear source of infection. Continue to monitor off antibiotics.  Acute blood loss anemia  She has been transfused 2 units of blood. Hemoglobin is 10 this morning. Continue to monitor closely. Transfuse as needed.  Thickened endometrium This was incidentally noted on the CT scan. This will need to be pursued as an outpatient.  DVT Prophylaxis: SCDs.   Code Status: Full code  Family Communication: Discussed with the patient, her husband and her daughter  Disposition Plan: Continue to remain in step down    LOS: 1 day   [facility] Pager [phone] 01/25/2014, 7:40 AM  If 7PM-7AM, please  contact night-coverage at [email or web address], password [facility]

## 2014-01-25 NOTE — Progress Notes (Signed)
Pooled RBC scan results noted. IR proceding with visceral angiography. Will cont to follow.

## 2014-01-25 NOTE — Sedation Documentation (Signed)
23fr sheath removed from right fem artery by Dr. Laurence Ferrari. Hemostasis achieved by exoseal closure device. R groin level 0, 3+RDP

## 2014-01-25 NOTE — Progress Notes (Signed)
CRITICAL VALUE ALERT  Critical value received:  Active bleeding focused in mid ascending colon  Date of notification:  01/25/2014  Time of notification:  1259  Critical value read back:  yes  Nurse who received alert:  Delorise Jackson RN/Jyaire Koudelka Caryl Pina, RN  MD notified (1st page):  Curly Rim  Time of first page:  1301  MD notified (2nd page):  Time of second page:  Responding MD:    Time MD responded:

## 2014-01-25 NOTE — Consult Note (Signed)
[facility] Consult Note  Referring Provider: No ref. provider found Primary Care Physician:  Tommy Medal, MD Primary Gastroenterologist:  Dr.  Laurel Dimmer Complaint: Rectal bleeding HPI: April [name] is an 80 y.o.   female  who presents with multiple bloody bowel movements without any abdominal pain over 24 hours with some lightheadedness. She is not on any anticoagulants. She was noted to have a WBC count of 23,000 which is apparently chronic for her with a significant component of eosinophilia. Her hemoglobin is 10.7. CT scan suggested active bleeding in the right colon possibly suspicious for AVM. Her last colonoscopy was in 2011 and was unremarkable. The patient is currently in nuclear medicine getting a pooled RBC study. If this is positive interventional radiology plans to proceed to angiogram  Past Medical History  Diagnosis Date  . Hypertension   . History of breast cancer 2011    DCIS, Right breast. No radiation, took Aromasen 2012  . Senile osteoporosis     Took Fosamax x15years  . Cancer     Past Surgical History  Procedure Laterality Date  . Laparoscopic cholecystectomy  05/26/2009  . Breast lumpectomy  12/24/2009    Right, Dr Margot Chimes  . Cataract extraction w/ intraocular lens  implant, bilateral Bilateral [id number]  . Tonsillectomy and adenoidectomy      Medications Prior to Admission  Medication Sig Dispense Refill  . acetaminophen (TYLENOL) 500 MG tablet Take 500 mg by mouth every 6 (six) hours as needed for pain (pain).    . calcium carbonate (OS-CAL) 600 MG TABS Take 600 mg by mouth daily.     . Cetirizine HCl (ZYRTEC PO) Take 1 tablet by mouth at bedtime.     . cholecalciferol (VITAMIN D) 1000 UNITS tablet Take 4,000 Units by mouth at bedtime.     . colestipol (MICRONIZED COLESTIPOL HCL) 1 G tablet Take 1 g by mouth 2 (two) times daily.    . Diphenoxylate-Atropine (LOMOTIL PO) Take 1 tablet by mouth daily as needed (loose stools).     . IRON PO Take 1  tablet by mouth at bedtime.     . magnesium gluconate (MAGONATE) 500 MG tablet Take 500 mg by mouth 2 (two) times daily.    . Multiple Vitamins-Minerals (MULTIVITAMIN WITH MINERALS) tablet Take 1 tablet by mouth daily.    . metroNIDAZOLE (METROGEL) 0.75 % gel Apply 1 application topically every morning.    . mometasone (NASONEX) 50 MCG/ACT nasal spray Place 2 sprays into the nose daily.      . Wheat Dextrin (BENEFIBER PO) Take by mouth 2 (two) times daily.      Allergies:  Allergies  Allergen Reactions  . Codeine   . Sulfa Antibiotics     Family History  Problem Relation Age of Onset  . Heart disease Mother   . Heart disease Father   . Diabetes Father   . Cancer Paternal Aunt     breast    Social History:  reports that she has never smoked. She has never used smokeless tobacco. She reports that she drinks alcohol. She reports that she does not use illicit drugs.  Review of Systems: negative except not obtainable  Blood pressure 114/41, pulse 79, temperature 98.7 F (37.1 C), temperature source Oral, resp. rate 28, height 5' 5" (1.651 m), weight 68.5 kg (151 lb 0.2 oz), SpO2 97 %. Head: Normocephalic, without obvious abnormality, atraumatic Neck: no adenopathy, no carotid bruit, no JVD, supple, symmetrical, trachea midline and thyroid not enlarged, symmetric, no tenderness/mass/nodules  Resp: clear to auscultation bilaterally Cardio: regular rate and rhythm, S1, S2 normal, no murmur, click, rub or gallop GI: Pendings: extremities normal, atraumatic, no cyanosis or edema  Results for orders placed or performed during the hospital encounter of 01/24/14 (from the past 48 hour(s))  ABO/Rh     Status: None   Collection Time: 01/24/14  6:34 PM  Result Value Ref Range   ABO/RH(D) B NEG   CBC with Differential     Status: Abnormal   Collection Time: 01/24/14  6:37 PM  Result Value Ref Range   WBC 23.1 (H) 4.0 - 10.5 K/uL    Comment: WHITE COUNT CONFIRMED ON SMEAR   RBC 4.21 3.87 -  5.11 MIL/uL   Hemoglobin 10.7 (L) 12.0 - 15.0 g/dL   HCT 33.8 (L) 36.0 - 46.0 %   MCV 80.3 78.0 - 100.0 fL   MCH 25.4 (L) 26.0 - 34.0 pg   MCHC 31.7 30.0 - 36.0 g/dL   RDW 16.2 (H) 11.5 - 15.5 %   Platelets 172 150 - 400 K/uL    Comment: REPEATED TO VERIFY SPECIMEN CHECKED FOR CLOTS PLATELET COUNT CONFIRMED BY SMEAR    Neutrophils Relative % 44 43 - 77 %   Neutro Abs 10.1 (H) 1.7 - 7.7 K/uL   Lymphocytes Relative 16 12 - 46 %   Lymphs Abs 3.8 0.7 - 4.0 K/uL   Monocytes Relative 4 3 - 12 %   Monocytes Absolute 0.9 0.1 - 1.0 K/uL   Eosinophils Relative 35 (H) 0 - 5 %   Eosinophils Absolute 8.1 (H) 0.0 - 0.7 K/uL   Basophils Relative 1 0 - 1 %   Basophils Absolute 0.2 (H) 0.0 - 0.1 K/uL  Basic metabolic panel     Status: Abnormal   Collection Time: 01/24/14  6:37 PM  Result Value Ref Range   Sodium 141 135 - 145 mmol/L    Comment: Please note change in reference range.   Potassium 3.8 3.5 - 5.1 mmol/L    Comment: Please note change in reference range.   Chloride 108 96 - 112 mEq/L   CO2 25 19 - 32 mmol/L   Glucose, Bld 112 (H) 70 - 99 mg/dL   BUN 21 6 - 23 mg/dL   Creatinine, Ser 0.72 0.50 - 1.10 mg/dL   Calcium 8.9 8.4 - 10.5 mg/dL   GFR calc non Af Amer 76 (L) >90 mL/min   GFR calc Af Amer 88 (L) >90 mL/min    Comment: (NOTE) The eGFR has been calculated using the CKD EPI equation. This calculation has not been validated in all clinical situations. eGFR's persistently <90 mL/min signify possible Chronic Kidney Disease.    Anion gap 8 5 - 15  Type and screen     Status: None (Preliminary result)   Collection Time: 01/24/14  6:37 PM  Result Value Ref Range   ABO/RH(D) B NEG    Antibody Screen NEG    Sample Expiration 01/27/2014    Unit Number [id number]    Blood Component Type RBC LR PHER2    Unit division 00    Status of Unit ISSUED    Transfusion Status OK TO TRANSFUSE    Crossmatch Result Compatible    Unit Number [id number]    Blood Component Type RED  CELLS,LR    Unit division 00    Status of Unit ISSUED    Transfusion Status OK TO TRANSFUSE    Crossmatch Result Compatible    Unit Number  [id number]    Blood Component Type RED CELLS,LR    Unit division 00    Status of Unit ALLOCATED    Transfusion Status OK TO TRANSFUSE    Crossmatch Result Compatible    Unit Number [id number]    Blood Component Type RBC LR PHER2    Unit division 00    Status of Unit ALLOCATED    Transfusion Status OK TO TRANSFUSE    Crossmatch Result Compatible   I-Stat Chem 8, ED     Status: Abnormal   Collection Time: 01/24/14 11:20 PM  Result Value Ref Range   Sodium 139 135 - 145 mmol/L   Potassium 3.8 3.5 - 5.1 mmol/L   Chloride 105 96 - 112 mEq/L   BUN 18 6 - 23 mg/dL   Creatinine, Ser 0.80 0.50 - 1.10 mg/dL   Glucose, Bld 144 (H) 70 - 99 mg/dL   Calcium, Ion 1.16 1.13 - 1.30 mmol/L   TCO2 18 0 - 100 mmol/L   Hemoglobin 11.2 (L) 12.0 - 15.0 g/dL   HCT 33.0 (L) 36.0 - 46.0 %  Prepare RBC     Status: None   Collection Time: 01/25/14 12:15 AM  Result Value Ref Range   Order Confirmation ORDER PROCESSED BY BLOOD BANK   MRSA PCR Screening     Status: None   Collection Time: 01/25/14 12:59 AM  Result Value Ref Range   MRSA by PCR NEGATIVE NEGATIVE    Comment:        The GeneXpert MRSA Assay (FDA approved for NASAL specimens only), is one component of a comprehensive MRSA colonization surveillance program. It is not intended to diagnose MRSA infection nor to guide or monitor treatment for MRSA infections.   Urinalysis, Routine w reflex microscopic     Status: Abnormal   Collection Time: 01/25/14  4:40 AM  Result Value Ref Range   Color, Urine RED (A) YELLOW    Comment: BIOCHEMICALS MAY BE AFFECTED BY COLOR   APPearance TURBID (A) CLEAR   Specific Gravity, Urine 1.035 (H) 1.005 - 1.030   pH 5.0 5.0 - 8.0   Glucose, UA NEGATIVE NEGATIVE mg/dL   Hgb urine dipstick LARGE (A) NEGATIVE   Bilirubin Urine LARGE (A) NEGATIVE   Ketones, ur 40  (A) NEGATIVE mg/dL   Protein, ur >300 (A) NEGATIVE mg/dL   Urobilinogen, UA 1.0 0.0 - 1.0 mg/dL   Nitrite POSITIVE (A) NEGATIVE   Leukocytes, UA MODERATE (A) NEGATIVE  Urine microscopic-add on     Status: None   Collection Time: 01/25/14  4:40 AM  Result Value Ref Range   RBC / HPF TOO NUMEROUS TO COUNT <3 RBC/hpf   Urine-Other FIELD OBSCURED BY RBC'S     Comment: URINALYSIS PERFORMED ON [facility] CORRECTED ON 12/31 AT 0518: PREVIOUSLY REPORTED AS FIELD OBSCURED BY RBC'S   Prepare RBC     Status: None   Collection Time: 01/25/14  5:00 AM  Result Value Ref Range   Order Confirmation ORDER PROCESSED BY BLOOD BANK   Urinalysis, Routine w reflex microscopic     Status: Abnormal   Collection Time: 01/25/14  6:41 AM  Result Value Ref Range   Color, Urine YELLOW YELLOW   APPearance CLEAR CLEAR   Specific Gravity, Urine 1.034 (H) 1.005 - 1.030   pH 5.0 5.0 - 8.0   Glucose, UA NEGATIVE NEGATIVE mg/dL   Hgb urine dipstick NEGATIVE NEGATIVE   Bilirubin Urine NEGATIVE NEGATIVE   Ketones, ur NEGATIVE NEGATIVE mg/dL  Protein, ur NEGATIVE NEGATIVE mg/dL   Urobilinogen, UA 0.2 0.0 - 1.0 mg/dL   Nitrite NEGATIVE NEGATIVE   Leukocytes, UA NEGATIVE NEGATIVE    Comment: MICROSCOPIC NOT DONE ON URINES WITH NEGATIVE PROTEIN, BLOOD, LEUKOCYTES, NITRITE, OR GLUCOSE <1000 mg/dL.  Magnesium     Status: None   Collection Time: 01/25/14  6:43 AM  Result Value Ref Range   Magnesium 1.8 1.5 - 2.5 mg/dL  Phosphorus     Status: None   Collection Time: 01/25/14  6:43 AM  Result Value Ref Range   Phosphorus 3.2 2.3 - 4.6 mg/dL  Comprehensive metabolic panel     Status: Abnormal   Collection Time: 01/25/14  6:43 AM  Result Value Ref Range   Sodium 140 135 - 145 mmol/L    Comment: Please note change in reference range.   Potassium 4.1 3.5 - 5.1 mmol/L    Comment: Please note change in reference range.   Chloride 112 96 - 112 mEq/L   CO2 23 19 - 32 mmol/L   Glucose, Bld 117 (H) 70 - 99 mg/dL   BUN  13 6 - 23 mg/dL   Creatinine, Ser 0.63 0.50 - 1.10 mg/dL   Calcium 7.8 (L) 8.4 - 10.5 mg/dL   Total Protein 4.9 (L) 6.0 - 8.3 g/dL   Albumin 3.2 (L) 3.5 - 5.2 g/dL   AST 16 0 - 37 U/L   ALT 12 0 - 35 U/L   Alkaline Phosphatase 45 39 - 117 U/L   Total Bilirubin 1.0 0.3 - 1.2 mg/dL   GFR calc non Af Amer 79 (L) >90 mL/min   GFR calc Af Amer >90 >90 mL/min    Comment: (NOTE) The eGFR has been calculated using the CKD EPI equation. This calculation has not been validated in all clinical situations. eGFR's persistently <90 mL/min signify possible Chronic Kidney Disease.    Anion gap 5 5 - 15  CBC     Status: Abnormal   Collection Time: 01/25/14  6:43 AM  Result Value Ref Range   WBC 23.2 (H) 4.0 - 10.5 K/uL   RBC 3.70 (L) 3.87 - 5.11 MIL/uL   Hemoglobin 10.1 (L) 12.0 - 15.0 g/dL   HCT 30.6 (L) 36.0 - 46.0 %   MCV 82.7 78.0 - 100.0 fL   MCH 27.3 26.0 - 34.0 pg   MCHC 33.0 30.0 - 36.0 g/dL   RDW 15.4 11.5 - 15.5 %   Platelets 130 (L) 150 - 400 K/uL    Comment: SPECIMEN CHECKED FOR CLOTS REPEATED TO VERIFY DELTA CHECK NOTED PLATELET COUNT CONFIRMED BY SMEAR   Protime-INR     Status: Abnormal   Collection Time: 01/25/14  6:43 AM  Result Value Ref Range   Prothrombin Time 16.2 (H) 11.6 - 15.2 seconds   INR 1.29 0.00 - 1.49   Ct Abdomen Pelvis W Contrast  01/24/2014   CLINICAL DATA:  Hematochezia.  EXAM: CT ABDOMEN AND PELVIS WITH CONTRAST  TECHNIQUE: Multidetector CT imaging of the abdomen and pelvis was performed using the standard protocol following bolus administration of intravenous contrast.  CONTRAST:  12m OMNIPAQUE IOHEXOL 300 MG/ML SOLN, 292mOMNIPAQUE IOHEXOL 300 MG/ML SOLN  COMPARISON:  10/25/2013  FINDINGS: BODY WALL: Unremarkable.  LOWER CHEST: Unremarkable.  ABDOMEN/PELVIS:  Liver: No focal abnormality.  Biliary: Cholecystectomy. No evidence of biliary of obstruction or calculus.  Pancreas: Unremarkable.  Spleen: Unremarkable.  Adrenals: Unremarkable.  Kidneys and  ureters: No hydronephrosis.  Left renal sinus cysts.  Bladder:  Unremarkable.  Reproductive: As noted on the previous study, there is low-density expansion of the atrophic uterus. The endometrial cavity or internal fluid measures 21 mm in AP dimension. This could be from endometrial neoplasia or cervical stenosis with trapped fluid. Ultrasound has been previously recommended. The ovaries are negative.  Bowel: There is high-density material within the lumen of the proximal colon. Oral contrast has not yet reached this segment of bowel, and the finding is compatible with hemorrhage into the lumen, in this location usually from angiodysplasia. The volume increases on delayed imaging. Colonic diverticulosis present in the sigmoid region, which could also explain hematochezia. Increased fluid content of bowel which could reflect diarrhea related to the GI bleed. Negative appendix. In the region of high-density material, there is questionable thickening of the wall, but a mass lesion cannot be called in the setting of diffuse stool/fluid. No mesenteric adenopathy.  Retroperitoneum: No mass or adenopathy.  Peritoneum: No ascites or pneumoperitoneum.  Vascular: No acute abnormality.  OSSEOUS: Facet arthropathy with L4-5 slip. Remote L1 inferior endplate fracture. This numbering assumes a transitional lumbosacral vertebra as S1.  These results were called by telephone at the time of interpretation on 01/24/2014 at 10:40 pm to Dr. Montine Circle , who verbally acknowledged these results.  IMPRESSION: 1. Active hemorrhage into the right colon, suggesting angiodysplasia. 2. Colonic diverticulosis. 3. Abnormal endometrial cavity which could reflect neoplasia. Sonography has been previously recommended.   Electronically Signed   By: Jorje Guild M.D.   On: 01/24/2014 22:40    Assessment: Painless probable lower GI bleed. Right colonic source suspected.  Plan:  Await results of nuclear medicine scan and possible  arteriography. May need colonoscopy if no bleeding source clearly identified or able to be embolized. Will follow-up on pending studies.  HAYES,JOHN C 01/25/2014, 8:46 AM

## 2014-01-25 NOTE — Progress Notes (Signed)
Utilization review completed.  

## 2014-01-25 NOTE — Consult Note (Signed)
Reason for consult: rectal bleeding, need for mesenteric arteriogram with possible embolization Referring Physician(s): Dr. Samara Deist  History of Present Illness: April [name] is a 80 y.o. female with history of painless rectal bleeding for past 24 hours. Prior to this episode she was experiencing frequent diarrhea and has undergone allergy treatments  for eosinophilia.  She had neg colonoscopy in 2011, no hx of prior bleeds or heavy NSAID use. 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  Recent CT demonstrates active hemorrhage in right colon (?AVM/angiodysplasia), thickened endometrium. Nuclear medicine bleeding scan is also positive in right colon. She has been receiving blood transfusions and latest hgb is 10.1. Request now received for mesenteric/visceral arteriogram with possible embolization.  Past Medical History  Diagnosis Date  . Hypertension   . History of breast cancer 2011    DCIS, Right breast. No radiation, took Aromasen 2012  . Senile osteoporosis     Took Fosamax x15years  . Cancer     Past Surgical History  Procedure Laterality Date  . Laparoscopic cholecystectomy  05/26/2009  . Breast lumpectomy  12/24/2009    Right, Dr Margot Chimes  . Cataract extraction w/ intraocular lens  implant, bilateral Bilateral [id number]  . Tonsillectomy and adenoidectomy      Allergies: Codeine and Sulfa antibiotics  Medications: Prior to Admission medications   Medication Sig Start Date End Date Taking? Authorizing Provider  acetaminophen (TYLENOL) 500 MG tablet Take 500 mg by mouth every 6 (six) hours as needed for pain (pain).   Yes Historical Provider, MD  calcium carbonate (OS-CAL) 600 MG TABS Take 600 mg by mouth daily.    Yes Historical Provider, MD  Cetirizine HCl (ZYRTEC PO) Take 1 tablet by mouth at bedtime.    Yes Historical Provider, MD  cholecalciferol (VITAMIN D) 1000 UNITS tablet Take 4,000 Units by mouth  at bedtime.    Yes Historical Provider, MD  colestipol (MICRONIZED COLESTIPOL HCL) 1 G tablet Take 1 g by mouth 2 (two) times daily.   Yes Historical Provider, MD  Diphenoxylate-Atropine (LOMOTIL PO) Take 1 tablet by mouth daily as needed (loose stools).    Yes Historical Provider, MD  IRON PO Take 1 tablet by mouth at bedtime.    Yes Historical Provider, MD  magnesium gluconate (MAGONATE) 500 MG tablet Take 500 mg by mouth 2 (two) times daily.   Yes Historical Provider, MD  Multiple Vitamins-Minerals (MULTIVITAMIN WITH MINERALS) tablet Take 1 tablet by mouth daily.   Yes Historical Provider, MD  metroNIDAZOLE (METROGEL) 0.75 % gel Apply 1 application topically every morning.    Historical Provider, MD  mometasone (NASONEX) 50 MCG/ACT nasal spray Place 2 sprays into the nose daily.      Historical Provider, MD  Wheat Dextrin (BENEFIBER PO) Take by mouth 2 (two) times daily.    Historical Provider, MD    Family History  Problem Relation Age of Onset  . Heart disease Mother   . Heart disease Father   . Diabetes Father   . Cancer Paternal Aunt     breast    History   Social History  . Marital Status: Married    Spouse Name: N/A    Number of Children: N/A  . Years of Education: N/A   Social History Main Topics  . Smoking status: Never Smoker   . Smokeless tobacco: Never Used  . Alcohol Use: Yes     Comment: 1 glass of wine a  night  . Drug Use: No  . Sexual Activity: Not Currently   Other Topics Concern  . None   Social History Narrative    Married Jeneen Rinks), 3 children, retired English as a second language teacher. Moved from [place] 2005.   No smoking history.  2 alcoholic drinks daily   Living will, health care power of attorney documents have been completed      Review of Systems  Constitutional: Negative for fever and chills.  Respiratory: Negative for cough and shortness of breath.   Cardiovascular: Negative for chest pain.  Gastrointestinal: Positive for diarrhea and blood  in stool. Negative for nausea, vomiting and abdominal pain.  Genitourinary: Negative for dysuria, hematuria and vaginal bleeding.  Musculoskeletal: Negative for back pain.  Neurological: Negative for headaches.       Sl lightheadedness     Vital Signs: BP 116/40 mmHg  Pulse 86  Temp(Src) 98.7 F (37.1 C) (Oral)  Resp 16  Ht 5\' 5"  (1.651 m)  Wt 151 lb 0.2 oz (68.5 kg)  BMI 25.13 kg/m2  SpO2 97%  Physical Exam  Constitutional: She is oriented to person, place, and time. She appears well-developed and well-nourished.  Cardiovascular: Normal rate, regular rhythm and intact distal pulses.   Pulmonary/Chest: Effort normal and breath sounds normal.  Abdominal: Soft. Bowel sounds are normal. There is no tenderness.  Musculoskeletal: Normal range of motion. She exhibits no edema.  Neurological: She is alert and oriented to person, place, and time.    Imaging: Ct Abdomen Pelvis W Contrast  01/24/2014   CLINICAL DATA:  Hematochezia.  EXAM: CT ABDOMEN AND PELVIS WITH CONTRAST  TECHNIQUE: Multidetector CT imaging of the abdomen and pelvis was performed using the standard protocol following bolus administration of intravenous contrast.  CONTRAST:  141mL OMNIPAQUE IOHEXOL 300 MG/ML SOLN, 65mL OMNIPAQUE IOHEXOL 300 MG/ML SOLN  COMPARISON:  10/25/2013  FINDINGS: BODY WALL: Unremarkable.  LOWER CHEST: Unremarkable.  ABDOMEN/PELVIS:  Liver: No focal abnormality.  Biliary: Cholecystectomy. No evidence of biliary of obstruction or calculus.  Pancreas: Unremarkable.  Spleen: Unremarkable.  Adrenals: Unremarkable.  Kidneys and ureters: No hydronephrosis.  Left renal sinus cysts.  Bladder: Unremarkable.  Reproductive: As noted on the previous study, there is low-density expansion of the atrophic uterus. The endometrial cavity or internal fluid measures 21 mm in AP dimension. This could be from endometrial neoplasia or cervical stenosis with trapped fluid. Ultrasound has been previously recommended. The ovaries  are negative.  Bowel: There is high-density material within the lumen of the proximal colon. Oral contrast has not yet reached this segment of bowel, and the finding is compatible with hemorrhage into the lumen, in this location usually from angiodysplasia. The volume increases on delayed imaging. Colonic diverticulosis present in the sigmoid region, which could also explain hematochezia. Increased fluid content of bowel which could reflect diarrhea related to the GI bleed. Negative appendix. In the region of high-density material, there is questionable thickening of the wall, but a mass lesion cannot be called in the setting of diffuse stool/fluid. No mesenteric adenopathy.  Retroperitoneum: No mass or adenopathy.  Peritoneum: No ascites or pneumoperitoneum.  Vascular: No acute abnormality.  OSSEOUS: Facet arthropathy with L4-5 slip. Remote L1 inferior endplate fracture. This numbering assumes a transitional lumbosacral vertebra as S1.  These results were called by telephone at the time of interpretation on 01/24/2014 at 10:40 pm to Dr. Montine Circle , who verbally acknowledged these results.  IMPRESSION: 1. Active hemorrhage into the right colon, suggesting angiodysplasia. 2. Colonic diverticulosis.  3. Abnormal endometrial cavity which could reflect neoplasia. Sonography has been previously recommended.   Electronically Signed   By: Jorje Guild M.D.   On: 01/24/2014 22:40    Labs:  CBC:  Recent Labs  02/20/13 0852 06/28/13 1117 01/24/14 1837 01/24/14 2320 01/25/14 0643  WBC 9.8 14.6* 23.1*  --  23.2*  HGB 13.0 11.4* 10.7* 11.2* 10.1*  HCT 39.3 35.8 33.8* 33.0* 30.6*  PLT 103* 159 172  --  130*    COAGS:  Recent Labs  01/25/14 0643  INR 1.29    BMP:  Recent Labs  06/28/13 1117 01/24/14 1837 01/24/14 2320 01/25/14 0643  NA 143 141 139 140  K 3.7 3.8 3.8 4.1  CL  --  108 105 112  CO2 21* 25  --  23  GLUCOSE 99 112* 144* 117*  BUN 12.3 21 18 13   CALCIUM 9.7 8.9  --  7.8*   CREATININE 0.8 0.72 0.80 0.63  GFRNONAA  --  76*  --  79*  GFRAA  --  88*  --  >90    LIVER FUNCTION TESTS:  Recent Labs  06/28/13 1117 01/25/14 0643  BILITOT 0.54 1.0  AST 16 16  ALT 12 12  ALKPHOS 67 45  PROT 7.1 4.9*  ALBUMIN 4.2 3.2*    TUMOR MARKERS: No results for input(s): AFPTM, CEA, CA199, CHROMGRNA in the last 8760 hours.  Assessment and Plan: April [name] is a 80 y.o. female with history of painless rectal bleeding for past 24 hours. Prior to this episode she was experiencing frequent diarrhea and has undergone allergy treatments  for eosinophilia.  She had neg colonoscopy in 2011, no hx of prior bleeds or heavy NSAID use. 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  Recent CT demonstrates active hemorrhage in right colon (?AVM/angiodysplasia), thickened endometrium. Nuclear medicine bleeding scan is also positive in right colon. She has been receiving blood transfusions and latest hgb is 10.1/creat nl.. Request now received for mesenteric/visceral arteriogram with possible embolization.Imaging studies have been reviewed by Dr. Laurence Ferrari. Details/risks of procedure d/w pt/family with their understanding and consent. Procedure planned for today ASAP.    I spent a total of 40 minutes face to face in clinical consultation, greater than 50% of which was counseling/coordinating care for visceral arteriogram with possible embolization.  Signed: Autumn Messing 01/25/2014, 12:51 PM

## 2014-01-25 NOTE — Procedures (Signed)
Interventional Radiology Procedure Note  Procedure: Visceral angiogram - no active bleeding or evidence of vascular dysplasia.  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 Access: Right common femoral artery (4F), ExoSeal used Complications: None Recommendations: - Bedrest x 4 hrs - If pt has recurrent acute large volume bleeding, repeat angio could be considered  Signed,  Criselda Peaches, MD

## 2014-01-25 NOTE — ED Notes (Signed)
Pt has had about 10 episodes of rectal bleeding, about 250 mL at a time, since arrival in ED. Pt now c/o new onset headache and dizziness. PA aware.

## 2014-01-26 DIAGNOSIS — D62 Acute posthemorrhagic anemia: Secondary | ICD-10-CM | POA: Diagnosis present

## 2014-01-26 LAB — CBC
HCT: 28.7 % — ABNORMAL LOW (ref 36.0–46.0)
HCT: 29.5 % — ABNORMAL LOW (ref 36.0–46.0)
HCT: 28.8 % — ABNORMAL LOW (ref 36.0–46.0)
HCT: 27.4 % — ABNORMAL LOW (ref 36.0–46.0)
HEMOGLOBIN: 9.1 g/dL — AB (ref 12.0–15.0)
Hemoglobin: 9.4 g/dL — ABNORMAL LOW (ref 12.0–15.0)
Hemoglobin: 9.7 g/dL — ABNORMAL LOW (ref 12.0–15.0)
Hemoglobin: 9.6 g/dL — ABNORMAL LOW (ref 12.0–15.0)
MCH: 27.6 pg (ref 26.0–34.0)
MCH: 27.6 pg (ref 26.0–34.0)
MCH: 27.7 pg (ref 26.0–34.0)
MCH: 27.7 pg (ref 26.0–34.0)
MCHC: 32.8 g/dL (ref 30.0–36.0)
MCHC: 32.9 g/dL (ref 30.0–36.0)
MCHC: 33.3 g/dL (ref 30.0–36.0)
MCHC: 33.2 g/dL (ref 30.0–36.0)
MCV: 84.4 fL (ref 78.0–100.0)
MCV: 83.8 fL (ref 78.0–100.0)
MCV: 83 fL (ref 78.0–100.0)
MCV: 83.5 fL (ref 78.0–100.0)
PLATELETS: 140 10*3/uL — AB (ref 150–400)
Platelets: 131 10*3/uL — ABNORMAL LOW (ref 150–400)
Platelets: 145 10*3/uL — ABNORMAL LOW (ref 150–400)
Platelets: 144 10*3/uL — ABNORMAL LOW (ref 150–400)
RBC: 3.4 MIL/uL — ABNORMAL LOW (ref 3.87–5.11)
RBC: 3.52 MIL/uL — AB (ref 3.87–5.11)
RBC: 3.47 MIL/uL — AB (ref 3.87–5.11)
RBC: 3.28 MIL/uL — AB (ref 3.87–5.11)
RDW: 15.8 % — AB (ref 11.5–15.5)
RDW: 15.8 % — ABNORMAL HIGH (ref 11.5–15.5)
RDW: 15.9 % — ABNORMAL HIGH (ref 11.5–15.5)
RDW: 16.1 % — ABNORMAL HIGH (ref 11.5–15.5)
WBC: 20.1 10*3/uL — ABNORMAL HIGH (ref 4.0–10.5)
WBC: 20.5 10*3/uL — ABNORMAL HIGH (ref 4.0–10.5)
WBC: 22.2 10*3/uL — ABNORMAL HIGH (ref 4.0–10.5)
WBC: 19.8 10*3/uL — ABNORMAL HIGH (ref 4.0–10.5)

## 2014-01-26 LAB — BASIC METABOLIC PANEL
Anion gap: 4 — ABNORMAL LOW (ref 5–15)
BUN: 8 mg/dL (ref 6–23)
CHLORIDE: 115 meq/L — AB (ref 96–112)
CO2: 23 mmol/L (ref 19–32)
Calcium: 8 mg/dL — ABNORMAL LOW (ref 8.4–10.5)
Creatinine, Ser: 0.66 mg/dL (ref 0.50–1.10)
GFR, EST NON AFRICAN AMERICAN: 78 mL/min — AB (ref >90)
GLUCOSE: 118 mg/dL — AB (ref 70–99)
Potassium: 3.7 mmol/L (ref 3.5–5.1)
SODIUM: 142 mmol/L (ref 135–145)

## 2014-01-26 MED ORDER — POLYETHYLENE GLYCOL 3350 17 G PO PACK
17.0000 g | PACK | Freq: Three times a day (TID) | ORAL | Status: DC
  Administered 2014-01-26 – 2014-01-28 (×4): 17 g via ORAL
  Filled 2014-01-26 (×14): qty 1

## 2014-01-26 NOTE — Progress Notes (Signed)
[facility] PROGRESS NOTE  April [name] KCM:[id number] DOB: Jul 22, 1925 DOA: 01/24/2014  PCP: Tommy Medal, MD  Brief HPI: 81 year old Caucasian female presented with rectal bleeding. She was transfused PRBCs. CT scan suggested bleeding from angiodysplasia. She was admitted for further management  Past medical history:  Past Medical History  Diagnosis Date  . Hypertension   . History of breast cancer 2011    DCIS, Right breast. No radiation, took Aromasen 2012  . Senile osteoporosis     Took Fosamax x15years  . Cancer     Consultants: Gastroenterology and interventional radiology  Procedures:  Visceral angiogram on December 31  Antibiotics: None  Subjective: Patient had 3 episodes of rectal bleeding yesterday. No clots noted. Denies any nausea.   Objective: Vital Signs  Filed Vitals:   01/26/14 0000 01/26/14 0007 01/26/14 0426 01/26/14 0445  BP: 108/43   135/40  Pulse: 84   83  Temp:  98.1 F (36.7 C) 97.8 F (36.6 C)   TempSrc:  Oral Oral   Resp: 21   16  Height:      Weight:      SpO2: 96%   99%    Intake/Output Summary (Last 24 hours) at 01/26/14 0725 Last data filed at 01/26/14 0600  Gross per 24 hour  Intake   1545 ml  Output   1450 ml  Net     95 ml   Filed Weights   01/25/14 0100  Weight: 68.5 kg (151 lb 0.2 oz)    General appearance: alert, cooperative, appears stated age and no distress Resp: clear to auscultation bilaterally Cardio: regular rate and rhythm, S1, S2 normal, no murmur, click, rub or gallop GI: soft, non-tender; bowel sounds normal; no masses,  no organomegaly Extremities: extremities normal, atraumatic, no cyanosis or edema Neurologic: No focal deficits  Lab Results:  Basic Metabolic Panel:  Recent Labs Lab 01/24/14 1837 01/24/14 2320 01/25/14 0643 01/26/14 0340  NA 141 139 140 142  K 3.8 3.8 4.1 3.7  CL 108 105 112 115*  CO2 25  --  23 23  GLUCOSE 112* 144* 117* 118*  BUN 21 18 13 8   CREATININE 0.72  0.80 0.63 0.66  CALCIUM 8.9  --  7.8* 8.0*  MG  --   --  1.8  --   PHOS  --   --  3.2  --    Liver Function Tests:  Recent Labs Lab 01/25/14 0643  AST 16  ALT 12  ALKPHOS 45  BILITOT 1.0  PROT 4.9*  ALBUMIN 3.2*   CBC:  Recent Labs Lab 01/24/14 1837 01/24/14 2320 01/25/14 0643 01/25/14 1500 01/25/14 2055 01/26/14 0340  WBC 23.1*  --  23.2* 17.4* 19.9* 20.1*  NEUTROABS 10.1*  --   --   --   --   --   HGB 10.7* 11.2* 10.1* 8.7* 9.4* 9.4*  HCT 33.8* 33.0* 30.6* 26.4* 29.0* 28.7*  MCV 80.3  --  82.7 83.3 84.3 84.4  PLT 172  --  130* 140* 131* 131*    Recent Results (from the past 240 hour(s))  MRSA PCR Screening     Status: None   Collection Time: 01/25/14 12:59 AM  Result Value Ref Range Status   MRSA by PCR NEGATIVE NEGATIVE Final    Comment:        The GeneXpert MRSA Assay (FDA approved for NASAL specimens only), is one component of a comprehensive MRSA colonization surveillance program. It is not intended to diagnose MRSA infection  nor to guide or monitor treatment for MRSA infections.       Studies/Results: Nm Gi Blood Loss  01/25/2014   CLINICAL DATA:  Multiple bloody stools with the last episode at 8:30 a.m. today.  EXAM: NUCLEAR MEDICINE GASTROINTESTINAL BLEEDING SCAN  TECHNIQUE: Sequential abdominal images were obtained following intravenous administration of Tc-54m labeled red blood cells.  RADIOPHARMACEUTICALS:  Twenty-one mCi Tc-50m in-vitro labeled red cells.  COMPARISON:  None.  FINDINGS: Following injection of the radiopharmaceutical there is normal expected activity in the abdominal aorta and iliac and femoral vessels as well as the liver and spleen. There is a subtle focus of persistent increased activity in the right mid abdomen laterally. This appears to correspond to the mid ascending colon. This does not migrate initially but over time the activity becomes more intense and there is radio- tracer migration into the transverse colon and proximal  descending colon.  IMPRESSION: Active gastrointestinal bleeding focus in the mid ascending colon. This is consistent with last night's CT findings.  These results were called by telephone at the time of interpretation on 01/25/2014 at 12:57 pm to Amada Jupiter, RN, who verbally acknowledged these results.   Electronically Signed   By: David  Martinique   On: 01/25/2014 12:59   Ct Abdomen Pelvis W Contrast  01/24/2014   CLINICAL DATA:  Hematochezia.  EXAM: CT ABDOMEN AND PELVIS WITH CONTRAST  TECHNIQUE: Multidetector CT imaging of the abdomen and pelvis was performed using the standard protocol following bolus administration of intravenous contrast.  CONTRAST:  140mL OMNIPAQUE IOHEXOL 300 MG/ML SOLN, 17mL OMNIPAQUE IOHEXOL 300 MG/ML SOLN  COMPARISON:  10/25/2013  FINDINGS: BODY WALL: Unremarkable.  LOWER CHEST: Unremarkable.  ABDOMEN/PELVIS:  Liver: No focal abnormality.  Biliary: Cholecystectomy. No evidence of biliary of obstruction or calculus.  Pancreas: Unremarkable.  Spleen: Unremarkable.  Adrenals: Unremarkable.  Kidneys and ureters: No hydronephrosis.  Left renal sinus cysts.  Bladder: Unremarkable.  Reproductive: As noted on the previous study, there is low-density expansion of the atrophic uterus. The endometrial cavity or internal fluid measures 21 mm in AP dimension. This could be from endometrial neoplasia or cervical stenosis with trapped fluid. Ultrasound has been previously recommended. The ovaries are negative.  Bowel: There is high-density material within the lumen of the proximal colon. Oral contrast has not yet reached this segment of bowel, and the finding is compatible with hemorrhage into the lumen, in this location usually from angiodysplasia. The volume increases on delayed imaging. Colonic diverticulosis present in the sigmoid region, which could also explain hematochezia. Increased fluid content of bowel which could reflect diarrhea related to the GI bleed. Negative appendix. In the region of  high-density material, there is questionable thickening of the wall, but a mass lesion cannot be called in the setting of diffuse stool/fluid. No mesenteric adenopathy.  Retroperitoneum: No mass or adenopathy.  Peritoneum: No ascites or pneumoperitoneum.  Vascular: No acute abnormality.  OSSEOUS: Facet arthropathy with L4-5 slip. Remote L1 inferior endplate fracture. This numbering assumes a transitional lumbosacral vertebra as S1.  These results were called by telephone at the time of interpretation on 01/24/2014 at 10:40 pm to Dr. Montine Circle , who verbally acknowledged these results.  IMPRESSION: 1. Active hemorrhage into the right colon, suggesting angiodysplasia. 2. Colonic diverticulosis. 3. Abnormal endometrial cavity which could reflect neoplasia. Sonography has been previously recommended.   Electronically Signed   By: Jorje Guild M.D.   On: 01/24/2014 22:40   Ir Angiogram Visceral Selective  01/25/2014   CLINICAL  DATA:  81 year old female with the lower GI bleed. CT of the abdomen and pelvis with contrast on 01/24/2014 demonstrated findings suggestive of extravasation of contrast material in the ascending colon. Patient was treated conservatively and did well until developing recurrent GI bleeding last night. A tagged nuclear medicine red blood cell study was positive for bleeding in the mid ascending colon. The patient now presents to Interventional Radiology for catheter angiography and possible embolization if the bleeding source is identified.  EXAM: SELECTIVE VISCERAL ARTERIOGRAPHY; ADDITIONAL ARTERIOGRAPHY; IR ULTRASOUND GUIDANCE VASC ACCESS RIGHT  Date: 01/25/2014  PROCEDURE: 1. Ultrasound-guided puncture of the right common femoral artery 2. Catheterization of the superior mesenteric artery with arteriogram 3. Catheterization of the ileal colloid artery with arteriogram 4. Catheterization of the inferior mesenteric artery with arteriogram 5. Limited right common femoral arteriogram 6.  Arterial closure with Cordis ExoSeal Interventional Radiologist:  Criselda Peaches, MD  ANESTHESIA/SEDATION: Moderate (conscious) sedation was used. 4.5 mg Versed, 125 mcg Fentanyl were administered intravenously. The patient's vital signs were monitored continuously by radiology nursing throughout the procedure.  Sedation Time: 34 minutes  MEDICATIONS: 2 g Ancef administered intravenously  FLUOROSCOPY TIME:  4 min 18 seconds 391 mGy  CONTRAST:  179mL OMNIPAQUE IOHEXOL 300 MG/ML  SOLN  TECHNIQUE: Informed consent was obtained from the patient following explanation of the procedure, risks, benefits and alternatives. The patient understands, agrees and consents for the procedure. All questions were addressed. A time out was performed.  Maximal barrier sterile technique utilized including caps, mask, sterile gowns, sterile gloves, large sterile drape, hand hygiene, and Betadine skin prep.  The right groin was interrogated with ultrasound. The patient has a high common femoral bifurcation. An image was obtained and stored for the medical record. Local anesthesia was attained by infiltration with 1% lidocaine. A small dermatotomy was made. Under real-time sonographic guidance, the common femoral artery was punctured just above the bifurcation. The 3 [place] inner portion of the a micropuncture sheath was advanced over the micro wire and a right common femoral arteriogram was performed confirming that the arterial puncture was below the inferior genu of the inferior epigastric artery and therefore below the inguinal ligament.  The micro sheath was then used to exchange the micro wire for a 0.035 Bentson wire. A C2 cobra catheter was advanced into the abdominal aorta an used to select the superior mesenteric artery. A superior mesenteric arteriogram was performed. There is a marked hypertrophy of the inferior pancreaticoduodenal arcade as well as a direct communication between the inferior pancreaticoduodenal arcade and  the splenic artery consistent with an arc of Buhler. There is retrograde filling of the branches of the celiac artery. There is a high-grade stenosis versus occlusion at the origin of the celiac artery.  A repeat arteriography was performed to include the inferior branches. There is no definite evidence of active extravasation. There is some increased density in the lateral margin of the ascending colon in the region of the recent bleeding. Therefore, the C2 catheter was advanced over a glidewire and positioned in the common trunk of the ileocolic and right gastric arteries. Repeat arteriography was performed in multiple obliquities which demonstrated in the density to represent overlapping of vessels. Despite multiple angiographic runs, no evidence of bleeding or irregularity was identified.  The C2 catheter was removed over a wire and a RIM catheter was introduced. The rim was used to select the inferior mesenteric artery and inferior mesenteric arteriogram was performed. No evidence of bleeding or vascular abnormality.  Hemostasis stress then the catheter was removed. Hemostasis was obtained with the assistance of a Cordis ExoSeal extra arterial collagen plug.  COMPLICATIONS: None  IMPRESSION: 1. Mesenteric arteriography is a negative for evidence of active bleeding or focal vascular abnormality that would suggest a source for bleeding. 2. 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 3. 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  PLAN: If the patient again developed significant acute lower GI bleeding resulting in hemodynamic changes (tachycardia or hypotension) repeat arteriography may be considered in an effort to identify and embolize the intermittent bleeding source.  In the setting of persistent intermittent low volume bleeding,  the without hemodynamic changes, repeat arteriography is unlikely to be of benefit.  Signed,  Criselda Peaches, MD  [facility]  [facility]   Electronically Signed   By: Jacqulynn Cadet M.D.   On: 01/25/2014 18:08   Ir Angiogram Visceral Selective  01/25/2014   CLINICAL DATA:  81 year old female with the lower GI bleed. CT of the abdomen and pelvis with contrast on 01/24/2014 demonstrated findings suggestive of extravasation of contrast material in the ascending colon. Patient was treated conservatively and did well until developing recurrent GI bleeding last night. A tagged nuclear medicine red blood cell study was positive for bleeding in the mid ascending colon. The patient now presents to Interventional Radiology for catheter angiography and possible embolization if the bleeding source is identified.  EXAM: SELECTIVE VISCERAL ARTERIOGRAPHY; ADDITIONAL ARTERIOGRAPHY; IR ULTRASOUND GUIDANCE VASC ACCESS RIGHT  Date: 01/25/2014  PROCEDURE: 1. Ultrasound-guided puncture of the right common femoral artery 2. Catheterization of the superior mesenteric artery with arteriogram 3. Catheterization of the ileal colloid artery with arteriogram 4. Catheterization of the inferior mesenteric artery with arteriogram 5. Limited right common femoral arteriogram 6. Arterial closure with Cordis ExoSeal Interventional Radiologist:  Criselda Peaches, MD  ANESTHESIA/SEDATION: Moderate (conscious) sedation was used. 4.5 mg Versed, 125 mcg Fentanyl were administered intravenously. The patient's vital signs were monitored continuously by radiology nursing throughout the procedure.  Sedation Time: 34 minutes  MEDICATIONS: 2 g Ancef administered intravenously  FLUOROSCOPY TIME:  4 min 18 seconds 391 mGy  CONTRAST:  169mL OMNIPAQUE IOHEXOL 300 MG/ML  SOLN  TECHNIQUE: Informed consent was obtained from the patient following explanation of the procedure, risks, benefits and  alternatives. The patient understands, agrees and consents for the procedure. All questions were addressed. A time out was performed.  Maximal barrier sterile technique utilized including caps, mask, sterile gowns, sterile gloves, large sterile drape, hand hygiene, and Betadine skin prep.  The right groin was interrogated with ultrasound. The patient has a high common femoral bifurcation. An image was obtained and stored for the medical record. Local anesthesia was attained by infiltration with 1% lidocaine. A small dermatotomy was made. Under real-time sonographic guidance, the common femoral artery was punctured just above the bifurcation. The 3 [place] inner portion of the a micropuncture sheath was advanced over the micro wire and a right common femoral arteriogram was performed confirming that the arterial puncture was below the inferior genu of the inferior epigastric artery and therefore below the inguinal ligament.  The micro sheath was then used to exchange the micro wire for a 0.035 Bentson wire. A C2 cobra catheter was advanced into the abdominal aorta an used to select the superior mesenteric artery. A superior mesenteric arteriogram  was performed. There is a marked hypertrophy of the inferior pancreaticoduodenal arcade as well as a direct communication between the inferior pancreaticoduodenal arcade and the splenic artery consistent with an arc of Buhler. There is retrograde filling of the branches of the celiac artery. There is a high-grade stenosis versus occlusion at the origin of the celiac artery.  A repeat arteriography was performed to include the inferior branches. There is no definite evidence of active extravasation. There is some increased density in the lateral margin of the ascending colon in the region of the recent bleeding. Therefore, the C2 catheter was advanced over a glidewire and positioned in the common trunk of the ileocolic and right gastric arteries. Repeat arteriography was  performed in multiple obliquities which demonstrated in the density to represent overlapping of vessels. Despite multiple angiographic runs, no evidence of bleeding or irregularity was identified.  The C2 catheter was removed over a wire and a RIM catheter was introduced. The rim was used to select the inferior mesenteric artery and inferior mesenteric arteriogram was performed. No evidence of bleeding or vascular abnormality.  Hemostasis stress then the catheter was removed. Hemostasis was obtained with the assistance of a Cordis ExoSeal extra arterial collagen plug.  COMPLICATIONS: None  IMPRESSION: 1. Mesenteric arteriography is a negative for evidence of active bleeding or focal vascular abnormality that would suggest a source for bleeding. 2. 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 3. 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  PLAN: If the patient again developed significant acute lower GI bleeding resulting in hemodynamic changes (tachycardia or hypotension) repeat arteriography may be considered in an effort to identify and embolize the intermittent bleeding source.  In the setting of persistent intermittent low volume bleeding, the without hemodynamic changes, repeat arteriography is unlikely to be of benefit.  Signed,  Criselda Peaches, MD  [facility]  [facility]   Electronically Signed   By: Jacqulynn Cadet M.D.   On: 01/25/2014 18:08   Ir Angiogram Selective Each Additional Vessel  01/25/2014   CLINICAL DATA:  81 year old female with the lower GI bleed. CT of the abdomen and pelvis with contrast on 01/24/2014 demonstrated findings suggestive of extravasation of contrast material in the ascending colon. Patient was treated conservatively and did well until  developing recurrent GI bleeding last night. A tagged nuclear medicine red blood cell study was positive for bleeding in the mid ascending colon. The patient now presents to Interventional Radiology for catheter angiography and possible embolization if the bleeding source is identified.  EXAM: SELECTIVE VISCERAL ARTERIOGRAPHY; ADDITIONAL ARTERIOGRAPHY; IR ULTRASOUND GUIDANCE VASC ACCESS RIGHT  Date: 01/25/2014  PROCEDURE: 1. Ultrasound-guided puncture of the right common femoral artery 2. Catheterization of the superior mesenteric artery with arteriogram 3. Catheterization of the ileal colloid artery with arteriogram 4. Catheterization of the inferior mesenteric artery with arteriogram 5. Limited right common femoral arteriogram 6. Arterial closure with Cordis ExoSeal Interventional Radiologist:  Criselda Peaches, MD  ANESTHESIA/SEDATION: Moderate (conscious) sedation was used. 4.5 mg Versed, 125 mcg Fentanyl were administered intravenously. The patient's vital signs were monitored continuously by radiology nursing throughout the procedure.  Sedation Time: 34 minutes  MEDICATIONS: 2 g Ancef administered intravenously  FLUOROSCOPY TIME:  4 min 18 seconds 391 mGy  CONTRAST:  111mL OMNIPAQUE IOHEXOL 300 MG/ML  SOLN  TECHNIQUE: Informed consent was obtained from  the patient following explanation of the procedure, risks, benefits and alternatives. The patient understands, agrees and consents for the procedure. All questions were addressed. A time out was performed.  Maximal barrier sterile technique utilized including caps, mask, sterile gowns, sterile gloves, large sterile drape, hand hygiene, and Betadine skin prep.  The right groin was interrogated with ultrasound. The patient has a high common femoral bifurcation. An image was obtained and stored for the medical record. Local anesthesia was attained by infiltration with 1% lidocaine. A small dermatotomy was made. Under real-time sonographic guidance, the common  femoral artery was punctured just above the bifurcation. The 3 [place] inner portion of the a micropuncture sheath was advanced over the micro wire and a right common femoral arteriogram was performed confirming that the arterial puncture was below the inferior genu of the inferior epigastric artery and therefore below the inguinal ligament.  The micro sheath was then used to exchange the micro wire for a 0.035 Bentson wire. A C2 cobra catheter was advanced into the abdominal aorta an used to select the superior mesenteric artery. A superior mesenteric arteriogram was performed. There is a marked hypertrophy of the inferior pancreaticoduodenal arcade as well as a direct communication between the inferior pancreaticoduodenal arcade and the splenic artery consistent with an arc of Buhler. There is retrograde filling of the branches of the celiac artery. There is a high-grade stenosis versus occlusion at the origin of the celiac artery.  A repeat arteriography was performed to include the inferior branches. There is no definite evidence of active extravasation. There is some increased density in the lateral margin of the ascending colon in the region of the recent bleeding. Therefore, the C2 catheter was advanced over a glidewire and positioned in the common trunk of the ileocolic and right gastric arteries. Repeat arteriography was performed in multiple obliquities which demonstrated in the density to represent overlapping of vessels. Despite multiple angiographic runs, no evidence of bleeding or irregularity was identified.  The C2 catheter was removed over a wire and a RIM catheter was introduced. The rim was used to select the inferior mesenteric artery and inferior mesenteric arteriogram was performed. No evidence of bleeding or vascular abnormality.  Hemostasis stress then the catheter was removed. Hemostasis was obtained with the assistance of a Cordis ExoSeal extra arterial collagen plug.  COMPLICATIONS: None   IMPRESSION: 1. Mesenteric arteriography is a negative for evidence of active bleeding or focal vascular abnormality that would suggest a source for bleeding. 2. 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 3. 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  PLAN: If the patient again developed significant acute lower GI bleeding resulting in hemodynamic changes (tachycardia or hypotension) repeat arteriography may be considered in an effort to identify and embolize the intermittent bleeding source.  In the setting of persistent intermittent low volume bleeding, the without hemodynamic changes, repeat arteriography is unlikely to be of benefit.  Signed,  Criselda Peaches, MD  [facility]  [facility]   Electronically Signed   By: Jacqulynn Cadet M.D.   On: 01/25/2014 18:08   Ir US Guide Vasc Access Right  01/25/2014   CLINICAL DATA:  81 year old female with the lower GI bleed. CT of the abdomen and pelvis with contrast on 01/24/2014 demonstrated findings suggestive of extravasation of contrast material in the ascending colon. Patient was  treated conservatively and did well until developing recurrent GI bleeding last night. A tagged nuclear medicine red blood cell study was positive for bleeding in the mid ascending colon. The patient now presents to Interventional Radiology for catheter angiography and possible embolization if the bleeding source is identified.  EXAM: SELECTIVE VISCERAL ARTERIOGRAPHY; ADDITIONAL ARTERIOGRAPHY; IR ULTRASOUND GUIDANCE VASC ACCESS RIGHT  Date: 01/25/2014  PROCEDURE: 1. Ultrasound-guided puncture of the right common femoral artery 2. Catheterization of the superior mesenteric artery with arteriogram 3. Catheterization of the ileal colloid artery with  arteriogram 4. Catheterization of the inferior mesenteric artery with arteriogram 5. Limited right common femoral arteriogram 6. Arterial closure with Cordis ExoSeal Interventional Radiologist:  Criselda Peaches, MD  ANESTHESIA/SEDATION: Moderate (conscious) sedation was used. 4.5 mg Versed, 125 mcg Fentanyl were administered intravenously. The patient's vital signs were monitored continuously by radiology nursing throughout the procedure.  Sedation Time: 34 minutes  MEDICATIONS: 2 g Ancef administered intravenously  FLUOROSCOPY TIME:  4 min 18 seconds 391 mGy  CONTRAST:  157mL OMNIPAQUE IOHEXOL 300 MG/ML  SOLN  TECHNIQUE: Informed consent was obtained from the patient following explanation of the procedure, risks, benefits and alternatives. The patient understands, agrees and consents for the procedure. All questions were addressed. A time out was performed.  Maximal barrier sterile technique utilized including caps, mask, sterile gowns, sterile gloves, large sterile drape, hand hygiene, and Betadine skin prep.  The right groin was interrogated with ultrasound. The patient has a high common femoral bifurcation. An image was obtained and stored for the medical record. Local anesthesia was attained by infiltration with 1% lidocaine. A small dermatotomy was made. Under real-time sonographic guidance, the common femoral artery was punctured just above the bifurcation. The 3 [place] inner portion of the a micropuncture sheath was advanced over the micro wire and a right common femoral arteriogram was performed confirming that the arterial puncture was below the inferior genu of the inferior epigastric artery and therefore below the inguinal ligament.  The micro sheath was then used to exchange the micro wire for a 0.035 Bentson wire. A C2 cobra catheter was advanced into the abdominal aorta an used to select the superior mesenteric artery. A superior mesenteric arteriogram was performed. There is a marked hypertrophy  of the inferior pancreaticoduodenal arcade as well as a direct communication between the inferior pancreaticoduodenal arcade and the splenic artery consistent with an arc of Buhler. There is retrograde filling of the branches of the celiac artery. There is a high-grade stenosis versus occlusion at the origin of the celiac artery.  A repeat arteriography was performed to include the inferior branches. There is no definite evidence of active extravasation. There is some increased density in the lateral margin of the ascending colon in the region of the recent bleeding. Therefore, the C2 catheter was advanced over a glidewire and positioned in the common trunk of the ileocolic and right gastric arteries. Repeat arteriography was performed in multiple obliquities which demonstrated in the density to represent overlapping of vessels. Despite multiple angiographic runs, no evidence of bleeding or irregularity was identified.  The C2 catheter was removed over a wire and a RIM catheter was introduced. The rim was used to select the inferior mesenteric artery and inferior mesenteric arteriogram was performed. No evidence of bleeding or vascular abnormality.  Hemostasis stress then the catheter was removed. Hemostasis was obtained with the assistance of a Cordis ExoSeal extra arterial collagen plug.  COMPLICATIONS: None  IMPRESSION: 1. Mesenteric arteriography is a negative for  evidence of active bleeding or focal vascular abnormality that would suggest a source for bleeding. 2. 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 3. 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  PLAN: If the patient again developed significant acute lower GI bleeding resulting in hemodynamic changes (tachycardia or hypotension) repeat arteriography may be considered in an  effort to identify and embolize the intermittent bleeding source.  In the setting of persistent intermittent low volume bleeding, the without hemodynamic changes, repeat arteriography is unlikely to be of benefit.  Signed,  Criselda Peaches, MD  [facility]  [facility]   Electronically Signed   By: Jacqulynn Cadet M.D.   On: 01/25/2014 18:08    Medications:  Scheduled: . sodium chloride   Intravenous Once  . sodium chloride   Intravenous Once  . antiseptic oral rinse  7 mL Mouth Rinse QID  . chlorhexidine  15 mL Mouth Rinse BID  . pantoprazole (PROTONIX) IV  40 mg Intravenous QHS  . sodium chloride  3 mL Intravenous Q12H   Continuous:  [id number] **OR** acetaminophen, HYDROcodone-acetaminophen, ondansetron **OR** ondansetron (ZOFRAN) IV  Assessment/Plan:  Principal Problem:   Colonic hemorrhage Active Problems:   hx: breast cancer, DCIS, right, receptor +   Anemia   Thickened endometrium    Hematochezia  The nuclear scan did identify an area of concern in the mid ascending colon. Angiogram was done yesterday. However, could not identify the bleeding vessel. Patient continues to have rectal bleeding. She might need colonoscopy. Await GI input this morning. Continue to monitor CBCs. Continue to remain in step down. Elevated WBC is noted. She is afebrile. No clear source of infection. Continue to monitor off antibiotics.  Acute blood loss anemia  She has been transfused 3 units of blood. Hemoglobin is 9.7 this morning. Continue to monitor closely. Transfuse as needed.  Thickened endometrium This was incidentally noted on the CT scan. This will need to be pursued as an outpatient.  DVT Prophylaxis: SCDs.   Code Status: Full code  Family Communication: Discussed with the patient. Disposition Plan: Continue to remain in step down    LOS: 2 days   [facility] Pager [phone] 01/26/2014, 7:25  AM  If 7PM-7AM, please contact night-coverage at [email or web address], password [facility]

## 2014-01-26 NOTE — Progress Notes (Signed)
1 small (quarter sized) dark/bloody BM after miralax. More formed than prior BMs.

## 2014-01-26 NOTE — Progress Notes (Signed)
Subjective: Pt feels ok, sitting up in bed this am. Reports having a few small bloody BMs late last pm and early this am, but no clots. S/p visceral angiogram yesterday without findign source bleed  Objective: Physical Exam: BP 135/40 mmHg  Pulse 83  Temp(Src) 98.3 F (36.8 C) (Oral)  Resp 16  Ht 5\' 5"  (1.651 m)  Wt 151 lb 0.2 oz (68.5 kg)  BMI 25.13 kg/m2  SpO2 99% Abd: soft, ND, NT (R)groin soft, NT, no hematoma Hgb appears stable  Labs: CBC  Recent Labs  01/25/14 2055 01/26/14 0340  WBC 19.9* 20.1*  HGB 9.4* 9.4*  HCT 29.0* 28.7*  PLT 131* 131*   BMET  Recent Labs  01/25/14 0643 01/26/14 0340  NA 140 142  K 4.1 3.7  CL 112 115*  CO2 23 23  GLUCOSE 117* 118*  BUN 13 8  CREATININE 0.63 0.66  CALCIUM 7.8* 8.0*   LFT  Recent Labs  01/25/14 0643  PROT 4.9*  ALBUMIN 3.2*  AST 16  ALT 12  ALKPHOS 45  BILITOT 1.0   PT/INR  Recent Labs  01/25/14 0643  LABPROT 16.2*  INR 1.29     Studies/Results: Nm Gi Blood Loss  01/25/2014   CLINICAL DATA:  Multiple bloody stools with the last episode at 8:30 a.m. today.  EXAM: NUCLEAR MEDICINE GASTROINTESTINAL BLEEDING SCAN  TECHNIQUE: Sequential abdominal images were obtained following intravenous administration of Tc-28m labeled red blood cells.  RADIOPHARMACEUTICALS:  Twenty-one mCi Tc-33m in-vitro labeled red cells.  COMPARISON:  None.  FINDINGS: Following injection of the radiopharmaceutical there is normal expected activity in the abdominal aorta and iliac and femoral vessels as well as the liver and spleen. There is a subtle focus of persistent increased activity in the right mid abdomen laterally. This appears to correspond to the mid ascending colon. This does not migrate initially but over time the activity becomes more intense and there is radio- tracer migration into the transverse colon and proximal descending colon.  IMPRESSION: Active gastrointestinal bleeding focus in the mid ascending colon. This is  consistent with last night's CT findings.  These results were called by telephone at the time of interpretation on 01/25/2014 at 12:57 pm to Amada Jupiter, RN, who verbally acknowledged these results.   Electronically Signed   By: David  Martinique   On: 01/25/2014 12:59   Ct Abdomen Pelvis W Contrast  01/24/2014   CLINICAL DATA:  Hematochezia.  EXAM: CT ABDOMEN AND PELVIS WITH CONTRAST  TECHNIQUE: Multidetector CT imaging of the abdomen and pelvis was performed using the standard protocol following bolus administration of intravenous contrast.  CONTRAST:  134mL OMNIPAQUE IOHEXOL 300 MG/ML SOLN, 29mL OMNIPAQUE IOHEXOL 300 MG/ML SOLN  COMPARISON:  10/25/2013  FINDINGS: BODY WALL: Unremarkable.  LOWER CHEST: Unremarkable.  ABDOMEN/PELVIS:  Liver: No focal abnormality.  Biliary: Cholecystectomy. No evidence of biliary of obstruction or calculus.  Pancreas: Unremarkable.  Spleen: Unremarkable.  Adrenals: Unremarkable.  Kidneys and ureters: No hydronephrosis.  Left renal sinus cysts.  Bladder: Unremarkable.  Reproductive: As noted on the previous study, there is low-density expansion of the atrophic uterus. The endometrial cavity or internal fluid measures 21 mm in AP dimension. This could be from endometrial neoplasia or cervical stenosis with trapped fluid. Ultrasound has been previously recommended. The ovaries are negative.  Bowel: There is high-density material within the lumen of the proximal colon. Oral contrast has not yet reached this segment of bowel, and the finding is compatible with hemorrhage into the lumen,  in this location usually from angiodysplasia. The volume increases on delayed imaging. Colonic diverticulosis present in the sigmoid region, which could also explain hematochezia. Increased fluid content of bowel which could reflect diarrhea related to the GI bleed. Negative appendix. In the region of high-density material, there is questionable thickening of the wall, but a mass lesion cannot be called in  the setting of diffuse stool/fluid. No mesenteric adenopathy.  Retroperitoneum: No mass or adenopathy.  Peritoneum: No ascites or pneumoperitoneum.  Vascular: No acute abnormality.  OSSEOUS: Facet arthropathy with L4-5 slip. Remote L1 inferior endplate fracture. This numbering assumes a transitional lumbosacral vertebra as S1.  These results were called by telephone at the time of interpretation on 01/24/2014 at 10:40 pm to Dr. Montine Circle , who verbally acknowledged these results.  IMPRESSION: 1. Active hemorrhage into the right colon, suggesting angiodysplasia. 2. Colonic diverticulosis. 3. Abnormal endometrial cavity which could reflect neoplasia. Sonography has been previously recommended.   Electronically Signed   By: Jorje Guild M.D.   On: 01/24/2014 22:40   Ir Angiogram Visceral Selective  01/25/2014   CLINICAL DATA:  81 year old female with the lower GI bleed. CT of the abdomen and pelvis with contrast on 01/24/2014 demonstrated findings suggestive of extravasation of contrast material in the ascending colon. Patient was treated conservatively and did well until developing recurrent GI bleeding last night. A tagged nuclear medicine red blood cell study was positive for bleeding in the mid ascending colon. The patient now presents to Interventional Radiology for catheter angiography and possible embolization if the bleeding source is identified.  EXAM: SELECTIVE VISCERAL ARTERIOGRAPHY; ADDITIONAL ARTERIOGRAPHY; IR ULTRASOUND GUIDANCE VASC ACCESS RIGHT  Date: 01/25/2014  PROCEDURE: 1. Ultrasound-guided puncture of the right common femoral artery 2. Catheterization of the superior mesenteric artery with arteriogram 3. Catheterization of the ileal colloid artery with arteriogram 4. Catheterization of the inferior mesenteric artery with arteriogram 5. Limited right common femoral arteriogram 6. Arterial closure with Cordis ExoSeal Interventional Radiologist:  Criselda Peaches, MD   ANESTHESIA/SEDATION: Moderate (conscious) sedation was used. 4.5 mg Versed, 125 mcg Fentanyl were administered intravenously. The patient's vital signs were monitored continuously by radiology nursing throughout the procedure.  Sedation Time: 34 minutes  MEDICATIONS: 2 g Ancef administered intravenously  FLUOROSCOPY TIME:  4 min 18 seconds 391 mGy  CONTRAST:  172mL OMNIPAQUE IOHEXOL 300 MG/ML  SOLN  TECHNIQUE: Informed consent was obtained from the patient following explanation of the procedure, risks, benefits and alternatives. The patient understands, agrees and consents for the procedure. All questions were addressed. A time out was performed.  Maximal barrier sterile technique utilized including caps, mask, sterile gowns, sterile gloves, large sterile drape, hand hygiene, and Betadine skin prep.  The right groin was interrogated with ultrasound. The patient has a high common femoral bifurcation. An image was obtained and stored for the medical record. Local anesthesia was attained by infiltration with 1% lidocaine. A small dermatotomy was made. Under real-time sonographic guidance, the common femoral artery was punctured just above the bifurcation. The 3 [place] inner portion of the a micropuncture sheath was advanced over the micro wire and a right common femoral arteriogram was performed confirming that the arterial puncture was below the inferior genu of the inferior epigastric artery and therefore below the inguinal ligament.  The micro sheath was then used to exchange the micro wire for a 0.035 Bentson wire. A C2 cobra catheter was advanced into the abdominal aorta an used to select the superior mesenteric artery. A superior mesenteric  arteriogram was performed. There is a marked hypertrophy of the inferior pancreaticoduodenal arcade as well as a direct communication between the inferior pancreaticoduodenal arcade and the splenic artery consistent with an arc of Buhler. There is retrograde filling of the  branches of the celiac artery. There is a high-grade stenosis versus occlusion at the origin of the celiac artery.  A repeat arteriography was performed to include the inferior branches. There is no definite evidence of active extravasation. There is some increased density in the lateral margin of the ascending colon in the region of the recent bleeding. Therefore, the C2 catheter was advanced over a glidewire and positioned in the common trunk of the ileocolic and right gastric arteries. Repeat arteriography was performed in multiple obliquities which demonstrated in the density to represent overlapping of vessels. Despite multiple angiographic runs, no evidence of bleeding or irregularity was identified.  The C2 catheter was removed over a wire and a RIM catheter was introduced. The rim was used to select the inferior mesenteric artery and inferior mesenteric arteriogram was performed. No evidence of bleeding or vascular abnormality.  Hemostasis stress then the catheter was removed. Hemostasis was obtained with the assistance of a Cordis ExoSeal extra arterial collagen plug.  COMPLICATIONS: None  IMPRESSION: 1. Mesenteric arteriography is a negative for evidence of active bleeding or focal vascular abnormality that would suggest a source for bleeding. 2. 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 3. 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  PLAN: If the patient again developed significant acute lower GI bleeding resulting in hemodynamic changes (tachycardia or hypotension) repeat arteriography may be considered in an effort to identify and embolize the intermittent bleeding source.  In the setting of persistent intermittent low volume bleeding, the without hemodynamic changes, repeat arteriography is unlikely to be of benefit.   Signed,  Criselda Peaches, MD  [facility]  [facility]   Electronically Signed   By: Jacqulynn Cadet M.D.   On: 01/25/2014 18:08   Ir Angiogram Visceral Selective  01/25/2014   CLINICAL DATA:  81 year old female with the lower GI bleed. CT of the abdomen and pelvis with contrast on 01/24/2014 demonstrated findings suggestive of extravasation of contrast material in the ascending colon. Patient was treated conservatively and did well until developing recurrent GI bleeding last night. A tagged nuclear medicine red blood cell study was positive for bleeding in the mid ascending colon. The patient now presents to Interventional Radiology for catheter angiography and possible embolization if the bleeding source is identified.  EXAM: SELECTIVE VISCERAL ARTERIOGRAPHY; ADDITIONAL ARTERIOGRAPHY; IR ULTRASOUND GUIDANCE VASC ACCESS RIGHT  Date: 01/25/2014  PROCEDURE: 1. Ultrasound-guided puncture of the right common femoral artery 2. Catheterization of the superior mesenteric artery with arteriogram 3. Catheterization of the ileal colloid artery with arteriogram 4. Catheterization of the inferior mesenteric artery with arteriogram 5. Limited right common femoral arteriogram 6. Arterial closure with Cordis ExoSeal Interventional Radiologist:  Criselda Peaches, MD  ANESTHESIA/SEDATION: Moderate (conscious) sedation was used. 4.5 mg Versed, 125 mcg Fentanyl were administered intravenously. The patient's vital signs were monitored continuously by radiology nursing throughout the procedure.  Sedation Time: 34 minutes  MEDICATIONS: 2 g Ancef administered intravenously  FLUOROSCOPY TIME:  4 min 18 seconds 391 mGy  CONTRAST:  136mL OMNIPAQUE IOHEXOL 300 MG/ML  SOLN  TECHNIQUE: Informed consent was obtained from the  patient following explanation of the procedure, risks, benefits and alternatives. The patient understands, agrees and consents for the procedure. All questions  were addressed. A time out was performed.  Maximal barrier sterile technique utilized including caps, mask, sterile gowns, sterile gloves, large sterile drape, hand hygiene, and Betadine skin prep.  The right groin was interrogated with ultrasound. The patient has a high common femoral bifurcation. An image was obtained and stored for the medical record. Local anesthesia was attained by infiltration with 1% lidocaine. A small dermatotomy was made. Under real-time sonographic guidance, the common femoral artery was punctured just above the bifurcation. The 3 [place] inner portion of the a micropuncture sheath was advanced over the micro wire and a right common femoral arteriogram was performed confirming that the arterial puncture was below the inferior genu of the inferior epigastric artery and therefore below the inguinal ligament.  The micro sheath was then used to exchange the micro wire for a 0.035 Bentson wire. A C2 cobra catheter was advanced into the abdominal aorta an used to select the superior mesenteric artery. A superior mesenteric arteriogram was performed. There is a marked hypertrophy of the inferior pancreaticoduodenal arcade as well as a direct communication between the inferior pancreaticoduodenal arcade and the splenic artery consistent with an arc of Buhler. There is retrograde filling of the branches of the celiac artery. There is a high-grade stenosis versus occlusion at the origin of the celiac artery.  A repeat arteriography was performed to include the inferior branches. There is no definite evidence of active extravasation. There is some increased density in the lateral margin of the ascending colon in the region of the recent bleeding. Therefore, the C2 catheter was advanced over a glidewire and positioned in the common trunk of the ileocolic and right gastric arteries. Repeat arteriography was performed in multiple obliquities which demonstrated in the density to represent overlapping of  vessels. Despite multiple angiographic runs, no evidence of bleeding or irregularity was identified.  The C2 catheter was removed over a wire and a RIM catheter was introduced. The rim was used to select the inferior mesenteric artery and inferior mesenteric arteriogram was performed. No evidence of bleeding or vascular abnormality.  Hemostasis stress then the catheter was removed. Hemostasis was obtained with the assistance of a Cordis ExoSeal extra arterial collagen plug.  COMPLICATIONS: None  IMPRESSION: 1. Mesenteric arteriography is a negative for evidence of active bleeding or focal vascular abnormality that would suggest a source for bleeding. 2. 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 3. 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  PLAN: If the patient again developed significant acute lower GI bleeding resulting in hemodynamic changes (tachycardia or hypotension) repeat arteriography may be considered in an effort to identify and embolize the intermittent bleeding source.  In the setting of persistent intermittent low volume bleeding, the without hemodynamic changes, repeat arteriography is unlikely to be of benefit.  Signed,  Criselda Peaches, MD  [facility]  [facility]   Electronically Signed   By: Jacqulynn Cadet M.D.   On: 01/25/2014 18:08   Ir Angiogram Selective Each Additional Vessel  01/25/2014   CLINICAL DATA:  81 year old female with the lower GI bleed. CT of the abdomen and pelvis with contrast on 01/24/2014 demonstrated findings suggestive of extravasation of contrast material in the ascending colon. Patient was treated  conservatively and did well until developing recurrent GI bleeding last night. A tagged nuclear medicine red blood cell study was positive  for bleeding in the mid ascending colon. The patient now presents to Interventional Radiology for catheter angiography and possible embolization if the bleeding source is identified.  EXAM: SELECTIVE VISCERAL ARTERIOGRAPHY; ADDITIONAL ARTERIOGRAPHY; IR ULTRASOUND GUIDANCE VASC ACCESS RIGHT  Date: 01/25/2014  PROCEDURE: 1. Ultrasound-guided puncture of the right common femoral artery 2. Catheterization of the superior mesenteric artery with arteriogram 3. Catheterization of the ileal colloid artery with arteriogram 4. Catheterization of the inferior mesenteric artery with arteriogram 5. Limited right common femoral arteriogram 6. Arterial closure with Cordis ExoSeal Interventional Radiologist:  Criselda Peaches, MD  ANESTHESIA/SEDATION: Moderate (conscious) sedation was used. 4.5 mg Versed, 125 mcg Fentanyl were administered intravenously. The patient's vital signs were monitored continuously by radiology nursing throughout the procedure.  Sedation Time: 34 minutes  MEDICATIONS: 2 g Ancef administered intravenously  FLUOROSCOPY TIME:  4 min 18 seconds 391 mGy  CONTRAST:  138mL OMNIPAQUE IOHEXOL 300 MG/ML  SOLN  TECHNIQUE: Informed consent was obtained from the patient following explanation of the procedure, risks, benefits and alternatives. The patient understands, agrees and consents for the procedure. All questions were addressed. A time out was performed.  Maximal barrier sterile technique utilized including caps, mask, sterile gowns, sterile gloves, large sterile drape, hand hygiene, and Betadine skin prep.  The right groin was interrogated with ultrasound. The patient has a high common femoral bifurcation. An image was obtained and stored for the medical record. Local anesthesia was attained by infiltration with 1% lidocaine. A small dermatotomy was made. Under real-time sonographic guidance, the common femoral artery was punctured just above the bifurcation. The 3 [place] inner portion of the a micropuncture  sheath was advanced over the micro wire and a right common femoral arteriogram was performed confirming that the arterial puncture was below the inferior genu of the inferior epigastric artery and therefore below the inguinal ligament.  The micro sheath was then used to exchange the micro wire for a 0.035 Bentson wire. A C2 cobra catheter was advanced into the abdominal aorta an used to select the superior mesenteric artery. A superior mesenteric arteriogram was performed. There is a marked hypertrophy of the inferior pancreaticoduodenal arcade as well as a direct communication between the inferior pancreaticoduodenal arcade and the splenic artery consistent with an arc of Buhler. There is retrograde filling of the branches of the celiac artery. There is a high-grade stenosis versus occlusion at the origin of the celiac artery.  A repeat arteriography was performed to include the inferior branches. There is no definite evidence of active extravasation. There is some increased density in the lateral margin of the ascending colon in the region of the recent bleeding. Therefore, the C2 catheter was advanced over a glidewire and positioned in the common trunk of the ileocolic and right gastric arteries. Repeat arteriography was performed in multiple obliquities which demonstrated in the density to represent overlapping of vessels. Despite multiple angiographic runs, no evidence of bleeding or irregularity was identified.  The C2 catheter was removed over a wire and a RIM catheter was introduced. The rim was used to select the inferior mesenteric artery and inferior mesenteric arteriogram was performed. No evidence of bleeding or vascular abnormality.  Hemostasis stress then the catheter was removed. Hemostasis was obtained with the assistance of a Cordis ExoSeal extra arterial collagen plug.  COMPLICATIONS: None  IMPRESSION: 1. Mesenteric arteriography is a negative for evidence  of active bleeding or focal vascular  abnormality that would suggest a source for bleeding. 2. 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 3. 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  PLAN: If the patient again developed significant acute lower GI bleeding resulting in hemodynamic changes (tachycardia or hypotension) repeat arteriography may be considered in an effort to identify and embolize the intermittent bleeding source.  In the setting of persistent intermittent low volume bleeding, the without hemodynamic changes, repeat arteriography is unlikely to be of benefit.  Signed,  Criselda Peaches, MD  [facility]  [facility]   Electronically Signed   By: Jacqulynn Cadet M.D.   On: 01/25/2014 18:08   Ir US Guide Vasc Access Right  01/25/2014   CLINICAL DATA:  81 year old female with the lower GI bleed. CT of the abdomen and pelvis with contrast on 01/24/2014 demonstrated findings suggestive of extravasation of contrast material in the ascending colon. Patient was treated conservatively and did well until developing recurrent GI bleeding last night. A tagged nuclear medicine red blood cell study was positive for bleeding in the mid ascending colon. The patient now presents to Interventional Radiology for catheter angiography and possible embolization if the bleeding source is identified.  EXAM: SELECTIVE VISCERAL ARTERIOGRAPHY; ADDITIONAL ARTERIOGRAPHY; IR ULTRASOUND GUIDANCE VASC ACCESS RIGHT  Date: 01/25/2014  PROCEDURE: 1. Ultrasound-guided puncture of the right common femoral artery 2. Catheterization of the superior mesenteric artery with arteriogram 3. Catheterization of the ileal colloid artery with arteriogram 4. Catheterization of the inferior mesenteric artery with arteriogram 5. Limited right common  femoral arteriogram 6. Arterial closure with Cordis ExoSeal Interventional Radiologist:  Criselda Peaches, MD  ANESTHESIA/SEDATION: Moderate (conscious) sedation was used. 4.5 mg Versed, 125 mcg Fentanyl were administered intravenously. The patient's vital signs were monitored continuously by radiology nursing throughout the procedure.  Sedation Time: 34 minutes  MEDICATIONS: 2 g Ancef administered intravenously  FLUOROSCOPY TIME:  4 min 18 seconds 391 mGy  CONTRAST:  168mL OMNIPAQUE IOHEXOL 300 MG/ML  SOLN  TECHNIQUE: Informed consent was obtained from the patient following explanation of the procedure, risks, benefits and alternatives. The patient understands, agrees and consents for the procedure. All questions were addressed. A time out was performed.  Maximal barrier sterile technique utilized including caps, mask, sterile gowns, sterile gloves, large sterile drape, hand hygiene, and Betadine skin prep.  The right groin was interrogated with ultrasound. The patient has a high common femoral bifurcation. An image was obtained and stored for the medical record. Local anesthesia was attained by infiltration with 1% lidocaine. A small dermatotomy was made. Under real-time sonographic guidance, the common femoral artery was punctured just above the bifurcation. The 3 [place] inner portion of the a micropuncture sheath was advanced over the micro wire and a right common femoral arteriogram was performed confirming that the arterial puncture was below the inferior genu of the inferior epigastric artery and therefore below the inguinal ligament.  The micro sheath was then used to exchange the micro wire for a 0.035 Bentson wire. A C2 cobra catheter was advanced into the abdominal aorta an used to select the superior mesenteric artery. A superior mesenteric arteriogram was performed. There is a marked hypertrophy of the inferior pancreaticoduodenal arcade as well as a direct communication between the inferior  pancreaticoduodenal arcade and the splenic artery consistent with an arc  of Buhler. There is retrograde filling of the branches of the celiac artery. There is a high-grade stenosis versus occlusion at the origin of the celiac artery.  A repeat arteriography was performed to include the inferior branches. There is no definite evidence of active extravasation. There is some increased density in the lateral margin of the ascending colon in the region of the recent bleeding. Therefore, the C2 catheter was advanced over a glidewire and positioned in the common trunk of the ileocolic and right gastric arteries. Repeat arteriography was performed in multiple obliquities which demonstrated in the density to represent overlapping of vessels. Despite multiple angiographic runs, no evidence of bleeding or irregularity was identified.  The C2 catheter was removed over a wire and a RIM catheter was introduced. The rim was used to select the inferior mesenteric artery and inferior mesenteric arteriogram was performed. No evidence of bleeding or vascular abnormality.  Hemostasis stress then the catheter was removed. Hemostasis was obtained with the assistance of a Cordis ExoSeal extra arterial collagen plug.  COMPLICATIONS: None  IMPRESSION: 1. Mesenteric arteriography is a negative for evidence of active bleeding or focal vascular abnormality that would suggest a source for bleeding. 2. 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 3. 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  PLAN: If the patient again developed significant acute lower GI bleeding resulting in hemodynamic changes (tachycardia or hypotension) repeat arteriography may be considered in an effort to identify and embolize the intermittent bleeding source.  In the setting of persistent  intermittent low volume bleeding, the without hemodynamic changes, repeat arteriography is unlikely to be of benefit.  Signed,  Criselda Peaches, MD  [facility]  [facility]   Electronically Signed   By: Jacqulynn Cadet M.D.   On: 01/25/2014 18:08    Assessment/Plan: Lower GI bleed, source unknown. S/p visceral angio without finding source vessel. Hgb stable    LOS: 2 days    Ascencion Dike PA-C 01/26/2014 8:49 AM

## 2014-01-26 NOTE — Progress Notes (Signed)
[facility] Progress Note  Subjective: The patient denies any abdominal pain. She has noted some decrease in the frequency and redness of her stools. Positive nuclear scan and negative arteriogram noted. Denies any abdominal pain.  Objective: Vital signs in last 24 hours: Temp:  [97.8 F (36.6 C)-98.3 F (36.8 C)] 97.8 F (36.6 C) (01/01 1235) Pulse Rate:  [80-100] 93 (01/01 1300) Resp:  [14-23] 14 (01/01 1300) BP: (97-135)/(24-52) 134/42 mmHg (01/01 1235) SpO2:  [94 %-99 %] 98 % (01/01 1300) Weight change:    PE: Abdomen soft nondistended with normoactive bowel sounds. mildly mass or guarding  Lab Results: Results for orders placed or performed during the hospital encounter of 01/24/14 (from the past 24 hour(s))  CBC     Status: Abnormal   Collection Time: 01/25/14  3:00 PM  Result Value Ref Range   WBC 17.4 (H) 4.0 - 10.5 K/uL   RBC 3.17 (L) 3.87 - 5.11 MIL/uL   Hemoglobin 8.7 (L) 12.0 - 15.0 g/dL   HCT 26.4 (L) 36.0 - 46.0 %   MCV 83.3 78.0 - 100.0 fL   MCH 27.4 26.0 - 34.0 pg   MCHC 33.0 30.0 - 36.0 g/dL   RDW 15.6 (H) 11.5 - 15.5 %   Platelets 140 (L) 150 - 400 K/uL  CBC     Status: Abnormal   Collection Time: 01/25/14  8:55 PM  Result Value Ref Range   WBC 19.9 (H) 4.0 - 10.5 K/uL   RBC 3.44 (L) 3.87 - 5.11 MIL/uL   Hemoglobin 9.4 (L) 12.0 - 15.0 g/dL   HCT 29.0 (L) 36.0 - 46.0 %   MCV 84.3 78.0 - 100.0 fL   MCH 27.3 26.0 - 34.0 pg   MCHC 32.4 30.0 - 36.0 g/dL   RDW 15.7 (H) 11.5 - 15.5 %   Platelets 131 (L) 150 - 400 K/uL  CBC     Status: Abnormal   Collection Time: 01/26/14  3:40 AM  Result Value Ref Range   WBC 20.1 (H) 4.0 - 10.5 K/uL   RBC 3.40 (L) 3.87 - 5.11 MIL/uL   Hemoglobin 9.4 (L) 12.0 - 15.0 g/dL   HCT 28.7 (L) 36.0 - 46.0 %   MCV 84.4 78.0 - 100.0 fL   MCH 27.6 26.0 - 34.0 pg   MCHC 32.8 30.0 - 36.0 g/dL   RDW 15.8 (H) 11.5 - 15.5 %   Platelets 131 (L) 150 - 400 K/uL  Basic metabolic panel     Status: Abnormal   Collection  Time: 01/26/14  3:40 AM  Result Value Ref Range   Sodium 142 135 - 145 mmol/L   Potassium 3.7 3.5 - 5.1 mmol/L   Chloride 115 (H) 96 - 112 mEq/L   CO2 23 19 - 32 mmol/L   Glucose, Bld 118 (H) 70 - 99 mg/dL   BUN 8 6 - 23 mg/dL   Creatinine, Ser 0.66 0.50 - 1.10 mg/dL   Calcium 8.0 (L) 8.4 - 10.5 mg/dL   GFR calc non Af Amer 78 (L) >90 mL/min   GFR calc Af Amer >90 >90 mL/min   Anion gap 4 (L) 5 - 15  CBC     Status: Abnormal   Collection Time: 01/26/14  9:12 AM  Result Value Ref Range   WBC 20.5 (H) 4.0 - 10.5 K/uL   RBC 3.52 (L) 3.87 - 5.11 MIL/uL   Hemoglobin 9.7 (L) 12.0 - 15.0 g/dL   HCT 29.5 (L) 36.0 - 46.0 %  MCV 83.8 78.0 - 100.0 fL   MCH 27.6 26.0 - 34.0 pg   MCHC 32.9 30.0 - 36.0 g/dL   RDW 15.8 (H) 11.5 - 15.5 %   Platelets 145 (L) 150 - 400 K/uL    Studies/Results: Nm Gi Blood Loss  01/25/2014   CLINICAL DATA:  Multiple bloody stools with the last episode at 8:30 a.m. today.  EXAM: NUCLEAR MEDICINE GASTROINTESTINAL BLEEDING SCAN  TECHNIQUE: Sequential abdominal images were obtained following intravenous administration of Tc-25m labeled red blood cells.  RADIOPHARMACEUTICALS:  Twenty-one mCi Tc-47m in-vitro labeled red cells.  COMPARISON:  None.  FINDINGS: Following injection of the radiopharmaceutical there is normal expected activity in the abdominal aorta and iliac and femoral vessels as well as the liver and spleen. There is a subtle focus of persistent increased activity in the right mid abdomen laterally. This appears to correspond to the mid ascending colon. This does not migrate initially but over time the activity becomes more intense and there is radio- tracer migration into the transverse colon and proximal descending colon.  IMPRESSION: Active gastrointestinal bleeding focus in the mid ascending colon. This is consistent with last night's CT findings.  These results were called by telephone at the time of interpretation on 01/25/2014 at 12:57 pm to Amada Jupiter, RN,  who verbally acknowledged these results.   Electronically Signed   By: David  Martinique   On: 01/25/2014 12:59   Ct Abdomen Pelvis W Contrast  01/24/2014   CLINICAL DATA:  Hematochezia.  EXAM: CT ABDOMEN AND PELVIS WITH CONTRAST  TECHNIQUE: Multidetector CT imaging of the abdomen and pelvis was performed using the standard protocol following bolus administration of intravenous contrast.  CONTRAST:  161mL OMNIPAQUE IOHEXOL 300 MG/ML SOLN, 63mL OMNIPAQUE IOHEXOL 300 MG/ML SOLN  COMPARISON:  10/25/2013  FINDINGS: BODY WALL: Unremarkable.  LOWER CHEST: Unremarkable.  ABDOMEN/PELVIS:  Liver: No focal abnormality.  Biliary: Cholecystectomy. No evidence of biliary of obstruction or calculus.  Pancreas: Unremarkable.  Spleen: Unremarkable.  Adrenals: Unremarkable.  Kidneys and ureters: No hydronephrosis.  Left renal sinus cysts.  Bladder: Unremarkable.  Reproductive: As noted on the previous study, there is low-density expansion of the atrophic uterus. The endometrial cavity or internal fluid measures 21 mm in AP dimension. This could be from endometrial neoplasia or cervical stenosis with trapped fluid. Ultrasound has been previously recommended. The ovaries are negative.  Bowel: There is high-density material within the lumen of the proximal colon. Oral contrast has not yet reached this segment of bowel, and the finding is compatible with hemorrhage into the lumen, in this location usually from angiodysplasia. The volume increases on delayed imaging. Colonic diverticulosis present in the sigmoid region, which could also explain hematochezia. Increased fluid content of bowel which could reflect diarrhea related to the GI bleed. Negative appendix. In the region of high-density material, there is questionable thickening of the wall, but a mass lesion cannot be called in the setting of diffuse stool/fluid. No mesenteric adenopathy.  Retroperitoneum: No mass or adenopathy.  Peritoneum: No ascites or pneumoperitoneum.   Vascular: No acute abnormality.  OSSEOUS: Facet arthropathy with L4-5 slip. Remote L1 inferior endplate fracture. This numbering assumes a transitional lumbosacral vertebra as S1.  These results were called by telephone at the time of interpretation on 01/24/2014 at 10:40 pm to Dr. Montine Circle , who verbally acknowledged these results.  IMPRESSION: 1. Active hemorrhage into the right colon, suggesting angiodysplasia. 2. Colonic diverticulosis. 3. Abnormal endometrial cavity which could reflect neoplasia. Sonography has been previously  recommended.   Electronically Signed   By: Jorje Guild M.D.   On: 01/24/2014 22:40   Ir Angiogram Visceral Selective  01/25/2014   CLINICAL DATA:  81 year old female with the lower GI bleed. CT of the abdomen and pelvis with contrast on 01/24/2014 demonstrated findings suggestive of extravasation of contrast material in the ascending colon. Patient was treated conservatively and did well until developing recurrent GI bleeding last night. A tagged nuclear medicine red blood cell study was positive for bleeding in the mid ascending colon. The patient now presents to Interventional Radiology for catheter angiography and possible embolization if the bleeding source is identified.  EXAM: SELECTIVE VISCERAL ARTERIOGRAPHY; ADDITIONAL ARTERIOGRAPHY; IR ULTRASOUND GUIDANCE VASC ACCESS RIGHT  Date: 01/25/2014  PROCEDURE: 1. Ultrasound-guided puncture of the right common femoral artery 2. Catheterization of the superior mesenteric artery with arteriogram 3. Catheterization of the ileal colloid artery with arteriogram 4. Catheterization of the inferior mesenteric artery with arteriogram 5. Limited right common femoral arteriogram 6. Arterial closure with Cordis ExoSeal Interventional Radiologist:  Criselda Peaches, MD  ANESTHESIA/SEDATION: Moderate (conscious) sedation was used. 4.5 mg Versed, 125 mcg Fentanyl were administered intravenously. The patient's vital signs were monitored  continuously by radiology nursing throughout the procedure.  Sedation Time: 34 minutes  MEDICATIONS: 2 g Ancef administered intravenously  FLUOROSCOPY TIME:  4 min 18 seconds 391 mGy  CONTRAST:  157mL OMNIPAQUE IOHEXOL 300 MG/ML  SOLN  TECHNIQUE: Informed consent was obtained from the patient following explanation of the procedure, risks, benefits and alternatives. The patient understands, agrees and consents for the procedure. All questions were addressed. A time out was performed.  Maximal barrier sterile technique utilized including caps, mask, sterile gowns, sterile gloves, large sterile drape, hand hygiene, and Betadine skin prep.  The right groin was interrogated with ultrasound. The patient has a high common femoral bifurcation. An image was obtained and stored for the medical record. Local anesthesia was attained by infiltration with 1% lidocaine. A small dermatotomy was made. Under real-time sonographic guidance, the common femoral artery was punctured just above the bifurcation. The 3 [place] inner portion of the a micropuncture sheath was advanced over the micro wire and a right common femoral arteriogram was performed confirming that the arterial puncture was below the inferior genu of the inferior epigastric artery and therefore below the inguinal ligament.  The micro sheath was then used to exchange the micro wire for a 0.035 Bentson wire. A C2 cobra catheter was advanced into the abdominal aorta an used to select the superior mesenteric artery. A superior mesenteric arteriogram was performed. There is a marked hypertrophy of the inferior pancreaticoduodenal arcade as well as a direct communication between the inferior pancreaticoduodenal arcade and the splenic artery consistent with an arc of Buhler. There is retrograde filling of the branches of the celiac artery. There is a high-grade stenosis versus occlusion at the origin of the celiac artery.  A repeat arteriography was performed to include the  inferior branches. There is no definite evidence of active extravasation. There is some increased density in the lateral margin of the ascending colon in the region of the recent bleeding. Therefore, the C2 catheter was advanced over a glidewire and positioned in the common trunk of the ileocolic and right gastric arteries. Repeat arteriography was performed in multiple obliquities which demonstrated in the density to represent overlapping of vessels. Despite multiple angiographic runs, no evidence of bleeding or irregularity was identified.  The C2 catheter was removed over a wire and a  RIM catheter was introduced. The rim was used to select the inferior mesenteric artery and inferior mesenteric arteriogram was performed. No evidence of bleeding or vascular abnormality.  Hemostasis stress then the catheter was removed. Hemostasis was obtained with the assistance of a Cordis ExoSeal extra arterial collagen plug.  COMPLICATIONS: None  IMPRESSION: 1. Mesenteric arteriography is a negative for evidence of active bleeding or focal vascular abnormality that would suggest a source for bleeding. 2. 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 3. 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  PLAN: If the patient again developed significant acute lower GI bleeding resulting in hemodynamic changes (tachycardia or hypotension) repeat arteriography may be considered in an effort to identify and embolize the intermittent bleeding source.  In the setting of persistent intermittent low volume bleeding, the without hemodynamic changes, repeat arteriography is unlikely to be of benefit.  Signed,  Criselda Peaches, MD  [facility]  [facility]   Electronically Signed   By: Jacqulynn Cadet M.D.   On:  01/25/2014 18:08   Ir Angiogram Visceral Selective  01/25/2014   CLINICAL DATA:  81 year old female with the lower GI bleed. CT of the abdomen and pelvis with contrast on 01/24/2014 demonstrated findings suggestive of extravasation of contrast material in the ascending colon. Patient was treated conservatively and did well until developing recurrent GI bleeding last night. A tagged nuclear medicine red blood cell study was positive for bleeding in the mid ascending colon. The patient now presents to Interventional Radiology for catheter angiography and possible embolization if the bleeding source is identified.  EXAM: SELECTIVE VISCERAL ARTERIOGRAPHY; ADDITIONAL ARTERIOGRAPHY; IR ULTRASOUND GUIDANCE VASC ACCESS RIGHT  Date: 01/25/2014  PROCEDURE: 1. Ultrasound-guided puncture of the right common femoral artery 2. Catheterization of the superior mesenteric artery with arteriogram 3. Catheterization of the ileal colloid artery with arteriogram 4. Catheterization of the inferior mesenteric artery with arteriogram 5. Limited right common femoral arteriogram 6. Arterial closure with Cordis ExoSeal Interventional Radiologist:  Criselda Peaches, MD  ANESTHESIA/SEDATION: Moderate (conscious) sedation was used. 4.5 mg Versed, 125 mcg Fentanyl were administered intravenously. The patient's vital signs were monitored continuously by radiology nursing throughout the procedure.  Sedation Time: 34 minutes  MEDICATIONS: 2 g Ancef administered intravenously  FLUOROSCOPY TIME:  4 min 18 seconds 391 mGy  CONTRAST:  157mL OMNIPAQUE IOHEXOL 300 MG/ML  SOLN  TECHNIQUE: Informed consent was obtained from the patient following explanation of the procedure, risks, benefits and alternatives. The patient understands, agrees and consents for the procedure. All questions were addressed. A time out was performed.  Maximal barrier sterile technique utilized including caps, mask, sterile gowns, sterile gloves, large sterile drape, hand  hygiene, and Betadine skin prep.  The right groin was interrogated with ultrasound. The patient has a high common femoral bifurcation. An image was obtained and stored for the medical record. Local anesthesia was attained by infiltration with 1% lidocaine. A small dermatotomy was made. Under real-time sonographic guidance, the common femoral artery was punctured just above the bifurcation. The 3 [place] inner portion of the a micropuncture sheath was advanced over the micro wire and a right common femoral arteriogram was performed confirming that the arterial puncture was below the inferior genu of the inferior epigastric artery and therefore below the inguinal ligament.  The micro sheath was then used to exchange the micro  wire for a 0.035 Bentson wire. A C2 cobra catheter was advanced into the abdominal aorta an used to select the superior mesenteric artery. A superior mesenteric arteriogram was performed. There is a marked hypertrophy of the inferior pancreaticoduodenal arcade as well as a direct communication between the inferior pancreaticoduodenal arcade and the splenic artery consistent with an arc of Buhler. There is retrograde filling of the branches of the celiac artery. There is a high-grade stenosis versus occlusion at the origin of the celiac artery.  A repeat arteriography was performed to include the inferior branches. There is no definite evidence of active extravasation. There is some increased density in the lateral margin of the ascending colon in the region of the recent bleeding. Therefore, the C2 catheter was advanced over a glidewire and positioned in the common trunk of the ileocolic and right gastric arteries. Repeat arteriography was performed in multiple obliquities which demonstrated in the density to represent overlapping of vessels. Despite multiple angiographic runs, no evidence of bleeding or irregularity was identified.  The C2 catheter was removed over a wire and a RIM catheter was  introduced. The rim was used to select the inferior mesenteric artery and inferior mesenteric arteriogram was performed. No evidence of bleeding or vascular abnormality.  Hemostasis stress then the catheter was removed. Hemostasis was obtained with the assistance of a Cordis ExoSeal extra arterial collagen plug.  COMPLICATIONS: None  IMPRESSION: 1. Mesenteric arteriography is a negative for evidence of active bleeding or focal vascular abnormality that would suggest a source for bleeding. 2. 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 3. 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  PLAN: If the patient again developed significant acute lower GI bleeding resulting in hemodynamic changes (tachycardia or hypotension) repeat arteriography may be considered in an effort to identify and embolize the intermittent bleeding source.  In the setting of persistent intermittent low volume bleeding, the without hemodynamic changes, repeat arteriography is unlikely to be of benefit.  Signed,  Criselda Peaches, MD  [facility]  [facility]   Electronically Signed   By: Jacqulynn Cadet M.D.   On: 01/25/2014 18:08   Ir Angiogram Selective Each Additional Vessel  01/25/2014   CLINICAL DATA:  81 year old female with the lower GI bleed. CT of the abdomen and pelvis with contrast on 01/24/2014 demonstrated findings suggestive of extravasation of contrast material in the ascending colon. Patient was treated conservatively and did well until developing recurrent GI bleeding last night. A tagged nuclear medicine red blood cell study was positive for bleeding in the mid ascending colon. The patient now presents to Interventional Radiology for catheter angiography and possible embolization if the bleeding  source is identified.  EXAM: SELECTIVE VISCERAL ARTERIOGRAPHY; ADDITIONAL ARTERIOGRAPHY; IR ULTRASOUND GUIDANCE VASC ACCESS RIGHT  Date: 01/25/2014  PROCEDURE: 1. Ultrasound-guided puncture of the right common femoral artery 2. Catheterization of the superior mesenteric artery with arteriogram 3. Catheterization of the ileal colloid artery with arteriogram 4. Catheterization of the inferior mesenteric artery with arteriogram 5. Limited right common femoral arteriogram 6. Arterial closure with Cordis ExoSeal Interventional Radiologist:  Criselda Peaches, MD  ANESTHESIA/SEDATION: Moderate (conscious) sedation was used. 4.5 mg Versed, 125 mcg Fentanyl were administered intravenously. The patient's vital signs were monitored continuously by radiology nursing throughout the procedure.  Sedation Time: 34 minutes  MEDICATIONS: 2 g Ancef administered  intravenously  FLUOROSCOPY TIME:  4 min 18 seconds 391 mGy  CONTRAST:  175mL OMNIPAQUE IOHEXOL 300 MG/ML  SOLN  TECHNIQUE: Informed consent was obtained from the patient following explanation of the procedure, risks, benefits and alternatives. The patient understands, agrees and consents for the procedure. All questions were addressed. A time out was performed.  Maximal barrier sterile technique utilized including caps, mask, sterile gowns, sterile gloves, large sterile drape, hand hygiene, and Betadine skin prep.  The right groin was interrogated with ultrasound. The patient has a high common femoral bifurcation. An image was obtained and stored for the medical record. Local anesthesia was attained by infiltration with 1% lidocaine. A small dermatotomy was made. Under real-time sonographic guidance, the common femoral artery was punctured just above the bifurcation. The 3 [place] inner portion of the a micropuncture sheath was advanced over the micro wire and a right common femoral arteriogram was performed confirming that the arterial puncture was below the inferior genu of  the inferior epigastric artery and therefore below the inguinal ligament.  The micro sheath was then used to exchange the micro wire for a 0.035 Bentson wire. A C2 cobra catheter was advanced into the abdominal aorta an used to select the superior mesenteric artery. A superior mesenteric arteriogram was performed. There is a marked hypertrophy of the inferior pancreaticoduodenal arcade as well as a direct communication between the inferior pancreaticoduodenal arcade and the splenic artery consistent with an arc of Buhler. There is retrograde filling of the branches of the celiac artery. There is a high-grade stenosis versus occlusion at the origin of the celiac artery.  A repeat arteriography was performed to include the inferior branches. There is no definite evidence of active extravasation. There is some increased density in the lateral margin of the ascending colon in the region of the recent bleeding. Therefore, the C2 catheter was advanced over a glidewire and positioned in the common trunk of the ileocolic and right gastric arteries. Repeat arteriography was performed in multiple obliquities which demonstrated in the density to represent overlapping of vessels. Despite multiple angiographic runs, no evidence of bleeding or irregularity was identified.  The C2 catheter was removed over a wire and a RIM catheter was introduced. The rim was used to select the inferior mesenteric artery and inferior mesenteric arteriogram was performed. No evidence of bleeding or vascular abnormality.  Hemostasis stress then the catheter was removed. Hemostasis was obtained with the assistance of a Cordis ExoSeal extra arterial collagen plug.  COMPLICATIONS: None  IMPRESSION: 1. Mesenteric arteriography is a negative for evidence of active bleeding or focal vascular abnormality that would suggest a source for bleeding. 2. 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 3. Anatomic Arc of Buhler (direct communication  between the mesenteric and celiac arteries) and hyper trophic inferior pancreaticoduodenal arcade provide retrograde flow from the SMA to the celiac branch arteries.  PLAN: If the patient again developed significant acute lower GI bleeding resulting in hemodynamic changes (tachycardia or hypotension) repeat arteriography may be considered in an effort to identify and embolize the intermittent bleeding source.  In the setting of persistent intermittent low volume bleeding, the without hemodynamic changes, repeat arteriography is unlikely to be of benefit.  Signed,  Criselda Peaches, MD  [facility]  [facility]   Electronically Signed   By: Jacqulynn Cadet M.D.   On: 01/25/2014 18:08   Ir US Guide Vasc Access Right  01/25/2014   CLINICAL DATA:  81 year old female with  the lower GI bleed. CT of the abdomen and pelvis with contrast on 01/24/2014 demonstrated findings suggestive of extravasation of contrast material in the ascending colon. Patient was treated conservatively and did well until developing recurrent GI bleeding last night. A tagged nuclear medicine red blood cell study was positive for bleeding in the mid ascending colon. The patient now presents to Interventional Radiology for catheter angiography and possible embolization if the bleeding source is identified.  EXAM: SELECTIVE VISCERAL ARTERIOGRAPHY; ADDITIONAL ARTERIOGRAPHY; IR ULTRASOUND GUIDANCE VASC ACCESS RIGHT  Date: 01/25/2014  PROCEDURE: 1. Ultrasound-guided puncture of the right common femoral artery 2. Catheterization of the superior mesenteric artery with arteriogram 3. Catheterization of the ileal colloid artery with arteriogram 4. Catheterization of the inferior mesenteric artery with arteriogram 5. Limited right common femoral arteriogram 6. Arterial closure with Cordis ExoSeal Interventional Radiologist:  Criselda Peaches, MD  ANESTHESIA/SEDATION: Moderate (conscious) sedation was  used. 4.5 mg Versed, 125 mcg Fentanyl were administered intravenously. The patient's vital signs were monitored continuously by radiology nursing throughout the procedure.  Sedation Time: 34 minutes  MEDICATIONS: 2 g Ancef administered intravenously  FLUOROSCOPY TIME:  4 min 18 seconds 391 mGy  CONTRAST:  117mL OMNIPAQUE IOHEXOL 300 MG/ML  SOLN  TECHNIQUE: Informed consent was obtained from the patient following explanation of the procedure, risks, benefits and alternatives. The patient understands, agrees and consents for the procedure. All questions were addressed. A time out was performed.  Maximal barrier sterile technique utilized including caps, mask, sterile gowns, sterile gloves, large sterile drape, hand hygiene, and Betadine skin prep.  The right groin was interrogated with ultrasound. The patient has a high common femoral bifurcation. An image was obtained and stored for the medical record. Local anesthesia was attained by infiltration with 1% lidocaine. A small dermatotomy was made. Under real-time sonographic guidance, the common femoral artery was punctured just above the bifurcation. The 3 [place] inner portion of the a micropuncture sheath was advanced over the micro wire and a right common femoral arteriogram was performed confirming that the arterial puncture was below the inferior genu of the inferior epigastric artery and therefore below the inguinal ligament.  The micro sheath was then used to exchange the micro wire for a 0.035 Bentson wire. A C2 cobra catheter was advanced into the abdominal aorta an used to select the superior mesenteric artery. A superior mesenteric arteriogram was performed. There is a marked hypertrophy of the inferior pancreaticoduodenal arcade as well as a direct communication between the inferior pancreaticoduodenal arcade and the splenic artery consistent with an arc of Buhler. There is retrograde filling of the branches of the celiac artery. 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  A repeat arteriography was performed to include the inferior branches. There is no definite evidence of active extravasation. There is some increased density in the lateral margin of the ascending colon in the region of the recent bleeding. Therefore, the C2 catheter was advanced over a glidewire and positioned in the common trunk of the ileocolic and right gastric arteries. Repeat arteriography was performed in multiple obliquities which demonstrated in the density to represent overlapping of vessels. Despite multiple angiographic runs, no evidence of bleeding or irregularity was identified.  The C2 catheter was removed over a wire and a RIM catheter was introduced. The rim was used to select the inferior mesenteric artery and inferior mesenteric arteriogram was performed. No evidence of bleeding or vascular abnormality.  Hemostasis stress then the catheter  was removed. Hemostasis was obtained with the assistance of a Cordis ExoSeal extra arterial collagen plug.  COMPLICATIONS: None  IMPRESSION: 1. Mesenteric arteriography is a negative for evidence of active bleeding or focal vascular abnormality that would suggest a source for bleeding. 2. 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 3. 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  PLAN: If the patient again developed significant acute lower GI bleeding resulting in hemodynamic changes (tachycardia or hypotension) repeat arteriography may be considered in an effort to identify and embolize the intermittent bleeding source.  In the setting of persistent intermittent low volume bleeding, the without hemodynamic changes, repeat arteriography is unlikely to be of benefit.  Signed,  Criselda Peaches, MD  [facility]  [facility]   Electronically Signed   By: Jacqulynn Cadet M.D.   On: 01/25/2014 18:08      Assessment: Lower GI bleeding nuclear medicine scan positive in the right colon but arteriography negative. Peripheral eosinophilia workup for allergies negative per patient Diarrhea followed by Dr. Paulita Fujita  Plan: Will review GI workup by Dr. Paulita Fujita Give gentle purge with Rubin Payor lax. If bleeding stops will proceed with colonoscopy if becomes brisk consider arteriography again. Kind find previous colonoscopy done by Dr. Tessa Lerner 01/26/2014, 2:20 PM

## 2014-01-27 DIAGNOSIS — D72829 Elevated white blood cell count, unspecified: Secondary | ICD-10-CM

## 2014-01-27 DIAGNOSIS — K625 Hemorrhage of anus and rectum: Secondary | ICD-10-CM | POA: Diagnosis present

## 2014-01-27 LAB — CBC
HCT: 29.5 % — ABNORMAL LOW (ref 36.0–46.0)
HCT: 32.6 % — ABNORMAL LOW (ref 36.0–46.0)
Hemoglobin: 9.7 g/dL — ABNORMAL LOW (ref 12.0–15.0)
Hemoglobin: 10.6 g/dL — ABNORMAL LOW (ref 12.0–15.0)
MCH: 27.5 pg (ref 26.0–34.0)
MCH: 27.5 pg (ref 26.0–34.0)
MCHC: 32.9 g/dL (ref 30.0–36.0)
MCHC: 32.5 g/dL (ref 30.0–36.0)
MCV: 83.6 fL (ref 78.0–100.0)
MCV: 84.5 fL (ref 78.0–100.0)
PLATELETS: 169 10*3/uL (ref 150–400)
Platelets: 156 10*3/uL (ref 150–400)
RBC: 3.53 MIL/uL — ABNORMAL LOW (ref 3.87–5.11)
RBC: 3.86 MIL/uL — AB (ref 3.87–5.11)
RDW: 16.3 % — AB (ref 11.5–15.5)
RDW: 16.6 % — ABNORMAL HIGH (ref 11.5–15.5)
WBC: 21.6 10*3/uL — ABNORMAL HIGH (ref 4.0–10.5)
WBC: 28.5 10*3/uL — ABNORMAL HIGH (ref 4.0–10.5)

## 2014-01-27 LAB — BASIC METABOLIC PANEL
ANION GAP: 6 (ref 5–15)
BUN: 5 mg/dL — ABNORMAL LOW (ref 6–23)
CO2: 24 mmol/L (ref 19–32)
Calcium: 8.6 mg/dL (ref 8.4–10.5)
Chloride: 109 mEq/L (ref 96–112)
Creatinine, Ser: 0.69 mg/dL (ref 0.50–1.10)
GFR calc Af Amer: 89 mL/min — ABNORMAL LOW (ref >90)
GFR calc non Af Amer: 77 mL/min — ABNORMAL LOW (ref >90)
Glucose, Bld: 103 mg/dL — ABNORMAL HIGH (ref 70–99)
POTASSIUM: 3.5 mmol/L (ref 3.5–5.1)
SODIUM: 139 mmol/L (ref 135–145)

## 2014-01-27 MED ORDER — SODIUM CHLORIDE 0.9 % IV SOLN
INTRAVENOUS | Status: DC
Start: 2014-01-27 — End: 2014-01-30
  Administered 2014-01-28: 20 mL/h via INTRAVENOUS

## 2014-01-27 MED ORDER — PEG 3350-KCL-NA BICARB-NACL 420 G PO SOLR
4000.0000 mL | Freq: Once | ORAL | Status: AC
Start: 2014-01-27 — End: 2014-01-27
  Administered 2014-01-27: 4000 mL via ORAL
  Filled 2014-01-27: qty 4000

## 2014-01-27 MED ORDER — POTASSIUM CHLORIDE CRYS ER 20 MEQ PO TBCR
40.0000 meq | EXTENDED_RELEASE_TABLET | Freq: Once | ORAL | Status: AC
  Administered 2014-01-27: 40 meq via ORAL
  Filled 2014-01-27: qty 2

## 2014-01-27 NOTE — Progress Notes (Signed)
[facility] PROGRESS NOTE  April [name] [name]:[id number] DOB: 20-Aug-1925 DOA: 01/24/2014  PCP: Tommy Medal, MD  Brief HPI: 81 year old Caucasian female presented with rectal bleeding. She was transfused PRBCs. CT scan suggested bleeding from angiodysplasia. She was admitted for further management  Past medical history:  Past Medical History  Diagnosis Date  . Hypertension   . History of breast cancer 2011    DCIS, Right breast. No radiation, took Aromasen 2012  . Senile osteoporosis     Took Fosamax x15years  . Cancer     Consultants: Gastroenterology and interventional radiology  Procedures:  Visceral angiogram on December 31  Plan os for EGD/Colonoscopy 1/3  Antibiotics: None  Subjective: Patient had 3 bowel movements. All of which were small in quantity. 2 of which were brown. One of which may have had old blood. Denies any nausea, vomiting. She was informed about the abnormal appearing uterus noted on CT scan during this hospitalization as well as one done in September.   Objective: Vital Signs  Filed Vitals:   01/26/14 1935 01/26/14 1940 01/27/14 0000 01/27/14 0415  BP: 144/47  137/56 136/29  Pulse: 89  88 85  Temp:  98.1 F (36.7 C)    TempSrc:  Oral    Resp: 15  27 12   Height:      Weight:      SpO2: 100%  100% 100%    Intake/Output Summary (Last 24 hours) at 01/27/14 0739 Last data filed at 01/27/14 0700  Gross per 24 hour  Intake   1920 ml  Output    925 ml  Net    995 ml   Filed Weights   01/25/14 0100  Weight: 68.5 kg (151 lb 0.2 oz)    General appearance: alert, cooperative Resp: clear to auscultation bilaterally Cardio: regular rate and rhythm, S1, S2 normal, no murmur, click, rub or gallop GI: soft, non-tender; bowel sounds normal; no masses,  no organomegaly Extremities: extremities normal, atraumatic, no cyanosis or edema Neurologic: No focal deficits  Lab Results:  Basic Metabolic Panel:  Recent Labs Lab  01/24/14 1837 01/24/14 2320 01/25/14 0643 01/26/14 0340 01/27/14 0242  NA 141 139 140 142 139  K 3.8 3.8 4.1 3.7 3.5  CL 108 105 112 115* 109  CO2 25  --  23 23 24   GLUCOSE 112* 144* 117* 118* 103*  BUN 21 18 13 8  5*  CREATININE 0.72 0.80 0.63 0.66 0.69  CALCIUM 8.9  --  7.8* 8.0* 8.6  MG  --   --  1.8  --   --   PHOS  --   --  3.2  --   --    Liver Function Tests:  Recent Labs Lab 01/25/14 0643  AST 16  ALT 12  ALKPHOS 45  BILITOT 1.0  PROT 4.9*  ALBUMIN 3.2*   CBC:  Recent Labs Lab 01/24/14 1837  01/26/14 0340 01/26/14 0912 01/26/14 1503 01/26/14 2108 01/27/14 0242  WBC 23.1*  < > 20.1* 20.5* 22.2* 19.8* 21.6*  NEUTROABS 10.1*  --   --   --   --   --   --   HGB 10.7*  < > 9.4* 9.7* 9.6* 9.1* 9.7*  HCT 33.8*  < > 28.7* 29.5* 28.8* 27.4* 29.5*  MCV 80.3  < > 84.4 83.8 83.0 83.5 83.6  PLT 172  < > 131* 145* 144* 140* 156  < > = values in this interval not displayed.  Recent Results (from the past 240  hour(s))  MRSA PCR Screening     Status: None   Collection Time: 01/25/14 12:59 AM  Result Value Ref Range Status   MRSA by PCR NEGATIVE NEGATIVE Final    Comment:        The GeneXpert MRSA Assay (FDA approved for NASAL specimens only), is one component of a comprehensive MRSA colonization surveillance program. It is not intended to diagnose MRSA infection nor to guide or monitor treatment for MRSA infections.       Studies/Results: Nm Gi Blood Loss  01/25/2014   CLINICAL DATA:  Multiple bloody stools with the last episode at 8:30 a.m. today.  EXAM: NUCLEAR MEDICINE GASTROINTESTINAL BLEEDING SCAN  TECHNIQUE: Sequential abdominal images were obtained following intravenous administration of Tc-28m labeled red blood cells.  RADIOPHARMACEUTICALS:  Twenty-one mCi Tc-26m in-vitro labeled red cells.  COMPARISON:  None.  FINDINGS: Following injection of the radiopharmaceutical there is normal expected activity in the abdominal aorta and iliac and femoral  vessels as well as the liver and spleen. There is a subtle focus of persistent increased activity in the right mid abdomen laterally. This appears to correspond to the mid ascending colon. This does not migrate initially but over time the activity becomes more intense and there is radio- tracer migration into the transverse colon and proximal descending colon.  IMPRESSION: Active gastrointestinal bleeding focus in the mid ascending colon. This is consistent with last night's CT findings.  These results were called by telephone at the time of interpretation on 01/25/2014 at 12:57 pm to Amada Jupiter, RN, who verbally acknowledged these results.   Electronically Signed   By: David  Martinique   On: 01/25/2014 12:59   Ir Angiogram Visceral Selective  01/25/2014   CLINICAL DATA:  81 year old female with the lower GI bleed. CT of the abdomen and pelvis with contrast on 01/24/2014 demonstrated findings suggestive of extravasation of contrast material in the ascending colon. Patient was treated conservatively and did well until developing recurrent GI bleeding last night. A tagged nuclear medicine red blood cell study was positive for bleeding in the mid ascending colon. The patient now presents to Interventional Radiology for catheter angiography and possible embolization if the bleeding source is identified.  EXAM: SELECTIVE VISCERAL ARTERIOGRAPHY; ADDITIONAL ARTERIOGRAPHY; IR ULTRASOUND GUIDANCE VASC ACCESS RIGHT  Date: 01/25/2014  PROCEDURE: 1. Ultrasound-guided puncture of the right common femoral artery 2. Catheterization of the superior mesenteric artery with arteriogram 3. Catheterization of the ileal colloid artery with arteriogram 4. Catheterization of the inferior mesenteric artery with arteriogram 5. Limited right common femoral arteriogram 6. Arterial closure with Cordis ExoSeal Interventional Radiologist:  Criselda Peaches, MD  ANESTHESIA/SEDATION: Moderate (conscious) sedation was used. 4.5 mg Versed, 125  mcg Fentanyl were administered intravenously. The patient's vital signs were monitored continuously by radiology nursing throughout the procedure.  Sedation Time: 34 minutes  MEDICATIONS: 2 g Ancef administered intravenously  FLUOROSCOPY TIME:  4 min 18 seconds 391 mGy  CONTRAST:  165mL OMNIPAQUE IOHEXOL 300 MG/ML  SOLN  TECHNIQUE: Informed consent was obtained from the patient following explanation of the procedure, risks, benefits and alternatives. The patient understands, agrees and consents for the procedure. All questions were addressed. A time out was performed.  Maximal barrier sterile technique utilized including caps, mask, sterile gowns, sterile gloves, large sterile drape, hand hygiene, and Betadine skin prep.  The right groin was interrogated with ultrasound. The patient has a high common femoral bifurcation. An image was obtained and stored for the medical record. Local anesthesia was  attained by infiltration with 1% lidocaine. A small dermatotomy was made. Under real-time sonographic guidance, the common femoral artery was punctured just above the bifurcation. The 3 [place] inner portion of the a micropuncture sheath was advanced over the micro wire and a right common femoral arteriogram was performed confirming that the arterial puncture was below the inferior genu of the inferior epigastric artery and therefore below the inguinal ligament.  The micro sheath was then used to exchange the micro wire for a 0.035 Bentson wire. A C2 cobra catheter was advanced into the abdominal aorta an used to select the superior mesenteric artery. A superior mesenteric arteriogram was performed. There is a marked hypertrophy of the inferior pancreaticoduodenal arcade as well as a direct communication between the inferior pancreaticoduodenal arcade and the splenic artery consistent with an arc of Buhler. There is retrograde filling of the branches of the celiac artery. There is a high-grade stenosis versus occlusion at  the origin of the celiac artery.  A repeat arteriography was performed to include the inferior branches. There is no definite evidence of active extravasation. There is some increased density in the lateral margin of the ascending colon in the region of the recent bleeding. Therefore, the C2 catheter was advanced over a glidewire and positioned in the common trunk of the ileocolic and right gastric arteries. Repeat arteriography was performed in multiple obliquities which demonstrated in the density to represent overlapping of vessels. Despite multiple angiographic runs, no evidence of bleeding or irregularity was identified.  The C2 catheter was removed over a wire and a RIM catheter was introduced. The rim was used to select the inferior mesenteric artery and inferior mesenteric arteriogram was performed. No evidence of bleeding or vascular abnormality.  Hemostasis stress then the catheter was removed. Hemostasis was obtained with the assistance of a Cordis ExoSeal extra arterial collagen plug.  COMPLICATIONS: None  IMPRESSION: 1. Mesenteric arteriography is a negative for evidence of active bleeding or focal vascular abnormality that would suggest a source for bleeding. 2. 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 3. 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  PLAN: If the patient again developed significant acute lower GI bleeding resulting in hemodynamic changes (tachycardia or hypotension) repeat arteriography may be considered in an effort to identify and embolize the intermittent bleeding source.  In the setting of persistent intermittent low volume bleeding, the without hemodynamic changes, repeat arteriography is unlikely to be of benefit.  Signed,  Criselda Peaches, MD  [facility]   [facility]   Electronically Signed   By: Jacqulynn Cadet M.D.   On: 01/25/2014 18:08   Ir Angiogram Visceral Selective  01/25/2014   CLINICAL DATA:  81 year old female with the lower GI bleed. CT of the abdomen and pelvis with contrast on 01/24/2014 demonstrated findings suggestive of extravasation of contrast material in the ascending colon. Patient was treated conservatively and did well until developing recurrent GI bleeding last night. A tagged nuclear medicine red blood cell study was positive for bleeding in the mid ascending colon. The patient now presents to Interventional Radiology for catheter angiography and possible embolization if the bleeding source is identified.  EXAM: SELECTIVE VISCERAL ARTERIOGRAPHY; ADDITIONAL ARTERIOGRAPHY; IR ULTRASOUND GUIDANCE VASC ACCESS RIGHT  Date: 01/25/2014  PROCEDURE: 1. Ultrasound-guided puncture of the right common femoral artery 2. Catheterization of the superior mesenteric artery  with arteriogram 3. Catheterization of the ileal colloid artery with arteriogram 4. Catheterization of the inferior mesenteric artery with arteriogram 5. Limited right common femoral arteriogram 6. Arterial closure with Cordis ExoSeal Interventional Radiologist:  Criselda Peaches, MD  ANESTHESIA/SEDATION: Moderate (conscious) sedation was used. 4.5 mg Versed, 125 mcg Fentanyl were administered intravenously. The patient's vital signs were monitored continuously by radiology nursing throughout the procedure.  Sedation Time: 34 minutes  MEDICATIONS: 2 g Ancef administered intravenously  FLUOROSCOPY TIME:  4 min 18 seconds 391 mGy  CONTRAST:  152mL OMNIPAQUE IOHEXOL 300 MG/ML  SOLN  TECHNIQUE: Informed consent was obtained from the patient following explanation of the procedure, risks, benefits and alternatives. The patient understands, agrees and consents for the procedure. All questions were addressed. A time out was performed.  Maximal barrier sterile technique utilized  including caps, mask, sterile gowns, sterile gloves, large sterile drape, hand hygiene, and Betadine skin prep.  The right groin was interrogated with ultrasound. The patient has a high common femoral bifurcation. An image was obtained and stored for the medical record. Local anesthesia was attained by infiltration with 1% lidocaine. A small dermatotomy was made. Under real-time sonographic guidance, the common femoral artery was punctured just above the bifurcation. The 3 [place] inner portion of the a micropuncture sheath was advanced over the micro wire and a right common femoral arteriogram was performed confirming that the arterial puncture was below the inferior genu of the inferior epigastric artery and therefore below the inguinal ligament.  The micro sheath was then used to exchange the micro wire for a 0.035 Bentson wire. A C2 cobra catheter was advanced into the abdominal aorta an used to select the superior mesenteric artery. A superior mesenteric arteriogram was performed. There is a marked hypertrophy of the inferior pancreaticoduodenal arcade as well as a direct communication between the inferior pancreaticoduodenal arcade and the splenic artery consistent with an arc of Buhler. There is retrograde filling of the branches of the celiac artery. There is a high-grade stenosis versus occlusion at the origin of the celiac artery.  A repeat arteriography was performed to include the inferior branches. There is no definite evidence of active extravasation. There is some increased density in the lateral margin of the ascending colon in the region of the recent bleeding. Therefore, the C2 catheter was advanced over a glidewire and positioned in the common trunk of the ileocolic and right gastric arteries. Repeat arteriography was performed in multiple obliquities which demonstrated in the density to represent overlapping of vessels. Despite multiple angiographic runs, no evidence of bleeding or irregularity  was identified.  The C2 catheter was removed over a wire and a RIM catheter was introduced. The rim was used to select the inferior mesenteric artery and inferior mesenteric arteriogram was performed. No evidence of bleeding or vascular abnormality.  Hemostasis stress then the catheter was removed. Hemostasis was obtained with the assistance of a Cordis ExoSeal extra arterial collagen plug.  COMPLICATIONS: None  IMPRESSION: 1. Mesenteric arteriography is a negative for evidence of active bleeding or focal vascular abnormality that would suggest a source for bleeding. 2. 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 3. 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  PLAN: If the patient again developed significant acute lower GI bleeding resulting in hemodynamic changes (tachycardia or hypotension) repeat arteriography may be considered in an effort to  identify and embolize the intermittent bleeding source.  In the setting of persistent intermittent low volume bleeding, the without hemodynamic changes, repeat arteriography is unlikely to be of benefit.  Signed,  Criselda Peaches, MD  [facility]  [facility]   Electronically Signed   By: Jacqulynn Cadet M.D.   On: 01/25/2014 18:08   Ir Angiogram Selective Each Additional Vessel  01/25/2014   CLINICAL DATA:  81 year old female with the lower GI bleed. CT of the abdomen and pelvis with contrast on 01/24/2014 demonstrated findings suggestive of extravasation of contrast material in the ascending colon. Patient was treated conservatively and did well until developing recurrent GI bleeding last night. A tagged nuclear medicine red blood cell study was positive for bleeding in the mid ascending colon. The patient now presents to Interventional  Radiology for catheter angiography and possible embolization if the bleeding source is identified.  EXAM: SELECTIVE VISCERAL ARTERIOGRAPHY; ADDITIONAL ARTERIOGRAPHY; IR ULTRASOUND GUIDANCE VASC ACCESS RIGHT  Date: 01/25/2014  PROCEDURE: 1. Ultrasound-guided puncture of the right common femoral artery 2. Catheterization of the superior mesenteric artery with arteriogram 3. Catheterization of the ileal colloid artery with arteriogram 4. Catheterization of the inferior mesenteric artery with arteriogram 5. Limited right common femoral arteriogram 6. Arterial closure with Cordis ExoSeal Interventional Radiologist:  Criselda Peaches, MD  ANESTHESIA/SEDATION: Moderate (conscious) sedation was used. 4.5 mg Versed, 125 mcg Fentanyl were administered intravenously. The patient's vital signs were monitored continuously by radiology nursing throughout the procedure.  Sedation Time: 34 minutes  MEDICATIONS: 2 g Ancef administered intravenously  FLUOROSCOPY TIME:  4 min 18 seconds 391 mGy  CONTRAST:  124mL OMNIPAQUE IOHEXOL 300 MG/ML  SOLN  TECHNIQUE: Informed consent was obtained from the patient following explanation of the procedure, risks, benefits and alternatives. The patient understands, agrees and consents for the procedure. All questions were addressed. A time out was performed.  Maximal barrier sterile technique utilized including caps, mask, sterile gowns, sterile gloves, large sterile drape, hand hygiene, and Betadine skin prep.  The right groin was interrogated with ultrasound. The patient has a high common femoral bifurcation. An image was obtained and stored for the medical record. Local anesthesia was attained by infiltration with 1% lidocaine. A small dermatotomy was made. Under real-time sonographic guidance, the common femoral artery was punctured just above the bifurcation. The 3 [place] inner portion of the a micropuncture sheath was advanced over the micro wire and a right common femoral arteriogram was  performed confirming that the arterial puncture was below the inferior genu of the inferior epigastric artery and therefore below the inguinal ligament.  The micro sheath was then used to exchange the micro wire for a 0.035 Bentson wire. A C2 cobra catheter was advanced into the abdominal aorta an used to select the superior mesenteric artery. A superior mesenteric arteriogram was performed. There is a marked hypertrophy of the inferior pancreaticoduodenal arcade as well as a direct communication between the inferior pancreaticoduodenal arcade and the splenic artery consistent with an arc of Buhler. There is retrograde filling of the branches of the celiac artery. There is a high-grade stenosis versus occlusion at the origin of the celiac artery.  A repeat arteriography was performed to include the inferior branches. There is no definite evidence of active extravasation. There is some increased density in the lateral margin of the ascending colon in the region of the recent bleeding. Therefore, the C2 catheter was advanced over a glidewire and positioned in the common trunk of  the ileocolic and right gastric arteries. Repeat arteriography was performed in multiple obliquities which demonstrated in the density to represent overlapping of vessels. Despite multiple angiographic runs, no evidence of bleeding or irregularity was identified.  The C2 catheter was removed over a wire and a RIM catheter was introduced. The rim was used to select the inferior mesenteric artery and inferior mesenteric arteriogram was performed. No evidence of bleeding or vascular abnormality.  Hemostasis stress then the catheter was removed. Hemostasis was obtained with the assistance of a Cordis ExoSeal extra arterial collagen plug.  COMPLICATIONS: None  IMPRESSION: 1. Mesenteric arteriography is a negative for evidence of active bleeding or focal vascular abnormality that would suggest a source for bleeding. 2. High-grade stenosis versus  occlusion of the celiac artery. 3. 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  PLAN: If the patient again developed significant acute lower GI bleeding resulting in hemodynamic changes (tachycardia or hypotension) repeat arteriography may be considered in an effort to identify and embolize the intermittent bleeding source.  In the setting of persistent intermittent low volume bleeding, the without hemodynamic changes, repeat arteriography is unlikely to be of benefit.  Signed,  Criselda Peaches, MD  [facility]  [facility]iology   Electronically Signed   By: Jacqulynn Cadet M.D.   On: 01/25/2014 18:08   Ir US Guide Vasc Access Right  01/25/2014   CLINICAL DATA:  81 year old female with the lower GI bleed. CT of the abdomen and pelvis with contrast on 01/24/2014 demonstrated findings suggestive of extravasation of contrast material in the ascending colon. Patient was treated conservatively and did well until developing recurrent GI bleeding last night. A tagged nuclear medicine red blood cell study was positive for bleeding in the mid ascending colon. The patient now presents to Interventional Radiology for catheter angiography and possible embolization if the bleeding source is identified.  EXAM: SELECTIVE VISCERAL ARTERIOGRAPHY; ADDITIONAL ARTERIOGRAPHY; IR ULTRASOUND GUIDANCE VASC ACCESS RIGHT  Date: 01/25/2014  PROCEDURE: 1. Ultrasound-guided puncture of the right common femoral artery 2. Catheterization of the superior mesenteric artery with arteriogram 3. Catheterization of the ileal colloid artery with arteriogram 4. Catheterization of the inferior mesenteric artery with arteriogram 5. Limited right common femoral arteriogram 6. Arterial closure with Cordis ExoSeal Interventional Radiologist:   Criselda Peaches, MD  ANESTHESIA/SEDATION: Moderate (conscious) sedation was used. 4.5 mg Versed, 125 mcg Fentanyl were administered intravenously. The patient's vital signs were monitored continuously by radiology nursing throughout the procedure.  Sedation Time: 34 minutes  MEDICATIONS: 2 g Ancef administered intravenously  FLUOROSCOPY TIME:  4 min 18 seconds 391 mGy  CONTRAST:  125mL OMNIPAQUE IOHEXOL 300 MG/ML  SOLN  TECHNIQUE: Informed consent was obtained from the patient following explanation of the procedure, risks, benefits and alternatives. The patient understands, agrees and consents for the procedure. All questions were addressed. A time out was performed.  Maximal barrier sterile technique utilized including caps, mask, sterile gowns, sterile gloves, large sterile drape, hand hygiene, and Betadine skin prep.  The right groin was interrogated with ultrasound. The patient has a high common femoral bifurcation. An image was obtained and stored for the medical record. Local anesthesia was attained by infiltration with 1% lidocaine. A small dermatotomy was made. Under real-time sonographic guidance, the common femoral artery was punctured just above the bifurcation. The 3 [place] inner portion of the a  micropuncture sheath was advanced over the micro wire and a right common femoral arteriogram was performed confirming that the arterial puncture was below the inferior genu of the inferior epigastric artery and therefore below the inguinal ligament.  The micro sheath was then used to exchange the micro wire for a 0.035 Bentson wire. A C2 cobra catheter was advanced into the abdominal aorta an used to select the superior mesenteric artery. A superior mesenteric arteriogram was performed. There is a marked hypertrophy of the inferior pancreaticoduodenal arcade as well as a direct communication between the inferior pancreaticoduodenal arcade and the splenic artery consistent with an arc of Buhler. There is  retrograde filling of the branches of the celiac artery. There is a high-grade stenosis versus occlusion at the origin of the celiac artery.  A repeat arteriography was performed to include the inferior branches. There is no definite evidence of active extravasation. There is some increased density in the lateral margin of the ascending colon in the region of the recent bleeding. Therefore, the C2 catheter was advanced over a glidewire and positioned in the common trunk of the ileocolic and right gastric arteries. Repeat arteriography was performed in multiple obliquities which demonstrated in the density to represent overlapping of vessels. Despite multiple angiographic runs, no evidence of bleeding or irregularity was identified.  The C2 catheter was removed over a wire and a RIM catheter was introduced. The rim was used to select the inferior mesenteric artery and inferior mesenteric arteriogram was performed. No evidence of bleeding or vascular abnormality.  Hemostasis stress then the catheter was removed. Hemostasis was obtained with the assistance of a Cordis ExoSeal extra arterial collagen plug.  COMPLICATIONS: None  IMPRESSION: 1. Mesenteric arteriography is a negative for evidence of active bleeding or focal vascular abnormality that would suggest a source for bleeding. 2. 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 3. 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  PLAN: If the patient again developed significant acute lower GI bleeding resulting in hemodynamic changes (tachycardia or hypotension) repeat arteriography may be considered in an effort to identify and embolize the intermittent bleeding source.  In the setting of persistent intermittent low volume bleeding, the without hemodynamic changes, repeat arteriography is  unlikely to be of benefit.  Signed,  Criselda Peaches, MD  [facility]  [facility]   Electronically Signed   By: Jacqulynn Cadet M.D.   On: 01/25/2014 18:08    Medications:  Scheduled: . sodium chloride   Intravenous Once  . sodium chloride   Intravenous Once  . antiseptic oral rinse  7 mL Mouth Rinse QID  . chlorhexidine  15 mL Mouth Rinse BID  . pantoprazole (PROTONIX) IV  40 mg Intravenous QHS  . polyethylene glycol  17 g Oral TID  . potassium chloride  40 mEq Oral Once  . sodium chloride  3 mL Intravenous Q12H   Continuous:  PYP:[id number] **OR** acetaminophen, HYDROcodone-acetaminophen, ondansetron **OR** ondansetron (ZOFRAN) IV  Assessment/Plan:  Principal Problem:   Colonic hemorrhage Active Problems:   hx: breast cancer, DCIS, right, receptor +   Thickened endometrium   Acute blood loss anemia    Hematochezia  The nuclear scan did identify an area of concern in the mid ascending colon. Angiogram was done however, could not identify the bleeding vessel.  it appears that her bleeding has slowed down.  GI continues to follow. Plan is for EGD and colonoscopy tomorrow. Hemoglobin remained stable  Leukocytosis with eosinophilia  WBC remains elevated. Apparently this has been worked up as an outpatient. Significant eosinophillia is noted. She is afebrile. No further workup during this hospitalization.  Acute blood loss anemia  She has been transfused 3 units of blood but none in the last 36 hours. Hemoglobin remains stable.   Thickened endometrium This was incidentally noted on the CT during this admission, as well as a previous CT scan done in September. Had a long discussion with the patient regarding the same. She denies any vaginal bleeding. I have asked her to assist discuss this further with her primary care physician. She may need to see a gynecologist as well. This will need to be pursued as an outpatient.  DVT  Prophylaxis: SCDs.   Code Status: Full code  Family Communication: Discussed with the patient. Disposition Plan: Start to mobilize patient. PT and OT to see. She could potentially be transferred out of the stepdown unit.     LOS: 3 days   [facility] Pager [phone] 01/27/2014, 7:39 AM  If 7PM-7AM, please contact night-coverage at [email or web address], password [facility]

## 2014-01-27 NOTE — Progress Notes (Signed)
OT Note  Patient Details Name: Chamya Hunton Lapenna MRN: [id number] DOB: 1925/11/08   Cancelled Treatment:    Reason Eval/Treat Not Completed: OT screened, no needs identified, will sign off  Jacquline Terrill A 01/27/2014, 1:18 PM

## 2014-01-27 NOTE — Progress Notes (Signed)
[facility] Progress Note  Subjective: Patient had 2 dark bloody stools followed by 2 brown stools, no abdominal pain  Objective: Vital signs in last 24 hours: Temp:  [97.8 F (36.6 [name])-98.1 F (36.7 [name])] 97.9 F (36.6 [name]) (01/02 0820) Pulse Rate:  [80-97] 85 (01/02 0415) Resp:  [12-27] 12 (01/02 0415) BP: (111-144)/(29-56) 136/29 mmHg (01/02 0415) SpO2:  [96 %-100 %] 100 % (01/02 0415) Weight change:    PE: Unchanged  Lab Results: Results for orders placed or performed during the hospital encounter of 01/24/14 (from the past 24 hour(s))  CBC     Status: Abnormal   Collection Time: 01/26/14  3:03 PM  Result Value Ref Range   WBC 22.2 (H) 4.0 - 10.5 K/uL   RBC 3.47 (L) 3.87 - 5.11 MIL/uL   Hemoglobin 9.6 (L) 12.0 - 15.0 g/dL   HCT 28.8 (L) 36.0 - 46.0 %   MCV 83.0 78.0 - 100.0 fL   MCH 27.7 26.0 - 34.0 pg   MCHC 33.3 30.0 - 36.0 g/dL   RDW 15.9 (H) 11.5 - 15.5 %   Platelets 144 (L) 150 - 400 K/uL  CBC     Status: Abnormal   Collection Time: 01/26/14  9:08 PM  Result Value Ref Range   WBC 19.8 (H) 4.0 - 10.5 K/uL   RBC 3.28 (L) 3.87 - 5.11 MIL/uL   Hemoglobin 9.1 (L) 12.0 - 15.0 g/dL   HCT 27.4 (L) 36.0 - 46.0 %   MCV 83.5 78.0 - 100.0 fL   MCH 27.7 26.0 - 34.0 pg   MCHC 33.2 30.0 - 36.0 g/dL   RDW 16.1 (H) 11.5 - 15.5 %   Platelets 140 (L) 150 - 400 K/uL  CBC     Status: Abnormal   Collection Time: 01/27/14  2:42 AM  Result Value Ref Range   WBC 21.6 (H) 4.0 - 10.5 K/uL   RBC 3.53 (L) 3.87 - 5.11 MIL/uL   Hemoglobin 9.7 (L) 12.0 - 15.0 g/dL   HCT 29.5 (L) 36.0 - 46.0 %   MCV 83.6 78.0 - 100.0 fL   MCH 27.5 26.0 - 34.0 pg   MCHC 32.9 30.0 - 36.0 g/dL   RDW 16.3 (H) 11.5 - 15.5 %   Platelets 156 150 - 400 K/uL  Basic metabolic panel     Status: Abnormal   Collection Time: 01/27/14  2:42 AM  Result Value Ref Range   Sodium 139 135 - 145 mmol/L   Potassium 3.5 3.5 - 5.1 mmol/L   Chloride 109 96 - 112 mEq/L   CO2 24 19 - 32 mmol/L   Glucose, Bld 103  (H) 70 - 99 mg/dL   BUN 5 (L) 6 - 23 mg/dL   Creatinine, Ser 0.69 0.50 - 1.10 mg/dL   Calcium 8.6 8.4 - 10.5 mg/dL   GFR calc non Af Amer 77 (L) >90 mL/min   GFR calc Af Amer 89 (L) >90 mL/min   Anion gap 6 5 - 15    Studies/Results: Nm Gi Blood Loss  01/25/2014   CLINICAL DATA:  Multiple bloody stools with the last episode at 8:30 a.m. today.  EXAM: NUCLEAR MEDICINE GASTROINTESTINAL BLEEDING SCAN  TECHNIQUE: Sequential abdominal images were obtained following intravenous administration of Tc-46m labeled red blood cells.  RADIOPHARMACEUTICALS:  Twenty-one mCi Tc-69m in-vitro labeled red cells.  COMPARISON:  None.  FINDINGS: Following injection of the radiopharmaceutical there is normal expected activity in the abdominal aorta and iliac and femoral vessels as  well as the liver and spleen. There is a subtle focus of persistent increased activity in the right mid abdomen laterally. This appears to correspond to the mid ascending colon. This does not migrate initially but over time the activity becomes more intense and there is radio- tracer migration into the transverse colon and proximal descending colon.  IMPRESSION: Active gastrointestinal bleeding focus in the mid ascending colon. This is consistent with last night's CT findings.  These results were called by telephone at the time of interpretation on 01/25/2014 at 12:57 pm to Amada Jupiter, RN, who verbally acknowledged these results.   Electronically Signed   By: David  Martinique   On: 01/25/2014 12:59   Ir Angiogram Visceral Selective  01/25/2014   CLINICAL DATA:  81 year old female with the lower GI bleed. CT of the abdomen and pelvis with contrast on 01/24/2014 demonstrated findings suggestive of extravasation of contrast material in the ascending colon. Patient was treated conservatively and did well until developing recurrent GI bleeding last night. A tagged nuclear medicine red blood cell study was positive for bleeding in the mid ascending colon.  The patient now presents to Interventional Radiology for catheter angiography and possible embolization if the bleeding source is identified.  EXAM: SELECTIVE VISCERAL ARTERIOGRAPHY; ADDITIONAL ARTERIOGRAPHY; IR ULTRASOUND GUIDANCE VASC ACCESS RIGHT  Date: 01/25/2014  PROCEDURE: 1. Ultrasound-guided puncture of the right common femoral artery 2. Catheterization of the superior mesenteric artery with arteriogram 3. Catheterization of the ileal colloid artery with arteriogram 4. Catheterization of the inferior mesenteric artery with arteriogram 5. Limited right common femoral arteriogram 6. Arterial closure with Cordis ExoSeal Interventional Radiologist:  Criselda Peaches, MD  ANESTHESIA/SEDATION: Moderate (conscious) sedation was used. 4.5 mg Versed, 125 mcg Fentanyl were administered intravenously. The patient's vital signs were monitored continuously by radiology nursing throughout the procedure.  Sedation Time: 34 minutes  MEDICATIONS: 2 g Ancef administered intravenously  FLUOROSCOPY TIME:  4 min 18 seconds 391 mGy  CONTRAST:  172mL OMNIPAQUE IOHEXOL 300 MG/ML  SOLN  TECHNIQUE: Informed consent was obtained from the patient following explanation of the procedure, risks, benefits and alternatives. The patient understands, agrees and consents for the procedure. All questions were addressed. A time out was performed.  Maximal barrier sterile technique utilized including caps, mask, sterile gowns, sterile gloves, large sterile drape, hand hygiene, and Betadine skin prep.  The right groin was interrogated with ultrasound. The patient has a high common femoral bifurcation. An image was obtained and stored for the medical record. Local anesthesia was attained by infiltration with 1% lidocaine. A small dermatotomy was made. Under real-time sonographic guidance, the common femoral artery was punctured just above the bifurcation. The 3 [place] inner portion of the a micropuncture sheath was advanced over the micro wire  and a right common femoral arteriogram was performed confirming that the arterial puncture was below the inferior genu of the inferior epigastric artery and therefore below the inguinal ligament.  The micro sheath was then used to exchange the micro wire for a 0.035 Bentson wire. A C2 cobra catheter was advanced into the abdominal aorta an used to select the superior mesenteric artery. A superior mesenteric arteriogram was performed. There is a marked hypertrophy of the inferior pancreaticoduodenal arcade as well as a direct communication between the inferior pancreaticoduodenal arcade and the splenic artery consistent with an arc of Buhler. There is retrograde filling of the branches of the celiac artery. There is a high-grade stenosis versus occlusion at the origin of the celiac artery.  A repeat arteriography was performed to include the inferior branches. There is no definite evidence of active extravasation. There is some increased density in the lateral margin of the ascending colon in the region of the recent bleeding. Therefore, the C2 catheter was advanced over a glidewire and positioned in the common trunk of the ileocolic and right gastric arteries. Repeat arteriography was performed in multiple obliquities which demonstrated in the density to represent overlapping of vessels. Despite multiple angiographic runs, no evidence of bleeding or irregularity was identified.  The C2 catheter was removed over a wire and a RIM catheter was introduced. The rim was used to select the inferior mesenteric artery and inferior mesenteric arteriogram was performed. No evidence of bleeding or vascular abnormality.  Hemostasis stress then the catheter was removed. Hemostasis was obtained with the assistance of a Cordis ExoSeal extra arterial collagen plug.  COMPLICATIONS: None  IMPRESSION: 1. Mesenteric arteriography is a negative for evidence of active bleeding or focal vascular abnormality that would suggest a source for  bleeding. 2. 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 3. 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  PLAN: If the patient again developed significant acute lower GI bleeding resulting in hemodynamic changes (tachycardia or hypotension) repeat arteriography may be considered in an effort to identify and embolize the intermittent bleeding source.  In the setting of persistent intermittent low volume bleeding, the without hemodynamic changes, repeat arteriography is unlikely to be of benefit.  Signed,  Criselda Peaches, MD  [facility]  [facility]   Electronically Signed   By: Jacqulynn Cadet M.D.   On: 01/25/2014 18:08   Ir Angiogram Visceral Selective  01/25/2014   CLINICAL DATA:  81 year old female with the lower GI bleed. CT of the abdomen and pelvis with contrast on 01/24/2014 demonstrated findings suggestive of extravasation of contrast material in the ascending colon. Patient was treated conservatively and did well until developing recurrent GI bleeding last night. A tagged nuclear medicine red blood cell study was positive for bleeding in the mid ascending colon. The patient now presents to Interventional Radiology for catheter angiography and possible embolization if the bleeding source is identified.  EXAM: SELECTIVE VISCERAL ARTERIOGRAPHY; ADDITIONAL ARTERIOGRAPHY; IR ULTRASOUND GUIDANCE VASC ACCESS RIGHT  Date: 01/25/2014  PROCEDURE: 1. Ultrasound-guided puncture of the right common femoral artery 2. Catheterization of the superior mesenteric artery with arteriogram 3. Catheterization of the ileal colloid artery with arteriogram 4. Catheterization of the inferior mesenteric artery with arteriogram 5. Limited right common femoral arteriogram 6. Arterial closure with  Cordis ExoSeal Interventional Radiologist:  Criselda Peaches, MD  ANESTHESIA/SEDATION: Moderate (conscious) sedation was used. 4.5 mg Versed, 125 mcg Fentanyl were administered intravenously. The patient's vital signs were monitored continuously by radiology nursing throughout the procedure.  Sedation Time: 34 minutes  MEDICATIONS: 2 g Ancef administered intravenously  FLUOROSCOPY TIME:  4 min 18 seconds 391 mGy  CONTRAST:  142mL OMNIPAQUE IOHEXOL 300 MG/ML  SOLN  TECHNIQUE: Informed consent was obtained from the patient following explanation of the procedure, risks, benefits and alternatives. The patient understands, agrees and consents for the procedure. All questions were addressed. A time out was performed.  Maximal barrier sterile technique utilized including caps, mask, sterile gowns, sterile gloves, large sterile drape, hand hygiene, and Betadine skin prep.  The right groin was interrogated with ultrasound. The patient  has a high common femoral bifurcation. An image was obtained and stored for the medical record. Local anesthesia was attained by infiltration with 1% lidocaine. A small dermatotomy was made. Under real-time sonographic guidance, the common femoral artery was punctured just above the bifurcation. The 3 [place] inner portion of the a micropuncture sheath was advanced over the micro wire and a right common femoral arteriogram was performed confirming that the arterial puncture was below the inferior genu of the inferior epigastric artery and therefore below the inguinal ligament.  The micro sheath was then used to exchange the micro wire for a 0.035 Bentson wire. A C2 cobra catheter was advanced into the abdominal aorta an used to select the superior mesenteric artery. A superior mesenteric arteriogram was performed. There is a marked hypertrophy of the inferior pancreaticoduodenal arcade as well as a direct communication between the inferior pancreaticoduodenal arcade and the splenic artery  consistent with an arc of Buhler. There is retrograde filling of the branches of the celiac artery. There is a high-grade stenosis versus occlusion at the origin of the celiac artery.  A repeat arteriography was performed to include the inferior branches. There is no definite evidence of active extravasation. There is some increased density in the lateral margin of the ascending colon in the region of the recent bleeding. Therefore, the C2 catheter was advanced over a glidewire and positioned in the common trunk of the ileocolic and right gastric arteries. Repeat arteriography was performed in multiple obliquities which demonstrated in the density to represent overlapping of vessels. Despite multiple angiographic runs, no evidence of bleeding or irregularity was identified.  The C2 catheter was removed over a wire and a RIM catheter was introduced. The rim was used to select the inferior mesenteric artery and inferior mesenteric arteriogram was performed. No evidence of bleeding or vascular abnormality.  Hemostasis stress then the catheter was removed. Hemostasis was obtained with the assistance of a Cordis ExoSeal extra arterial collagen plug.  COMPLICATIONS: None  IMPRESSION: 1. Mesenteric arteriography is a negative for evidence of active bleeding or focal vascular abnormality that would suggest a source for bleeding. 2. 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 3. 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  PLAN: If the patient again developed significant acute lower GI bleeding resulting in hemodynamic changes (tachycardia or hypotension) repeat arteriography may be considered in an effort to identify and embolize the intermittent bleeding source.  In the setting of persistent intermittent low volume bleeding, the without  hemodynamic changes, repeat arteriography is unlikely to be of benefit.  Signed,  Criselda Peaches, MD  [facility]  [facility]   Electronically Signed   By: Jacqulynn Cadet M.D.   On: 01/25/2014 18:08   Ir Angiogram Selective Each Additional Vessel  01/25/2014   CLINICAL DATA:  81 year old female with the lower GI bleed. CT of the abdomen and pelvis with contrast on 01/24/2014 demonstrated findings suggestive of extravasation of contrast material in the ascending colon. Patient was treated conservatively and did well until developing recurrent GI bleeding last night. A tagged nuclear medicine red blood cell study was positive for bleeding in the mid ascending colon. The patient now presents to Interventional Radiology for catheter angiography and possible embolization if the bleeding source is identified.  EXAM: SELECTIVE VISCERAL ARTERIOGRAPHY; ADDITIONAL ARTERIOGRAPHY; IR ULTRASOUND GUIDANCE VASC ACCESS RIGHT  Date: 01/25/2014  PROCEDURE: 1. Ultrasound-guided puncture of the right common femoral artery 2. Catheterization of the superior mesenteric artery with arteriogram 3. Catheterization of the ileal colloid artery with arteriogram 4. Catheterization of the inferior mesenteric artery with arteriogram 5. Limited right common femoral arteriogram 6. Arterial closure with Cordis ExoSeal Interventional Radiologist:  Criselda Peaches, MD  ANESTHESIA/SEDATION: Moderate (conscious) sedation was used. 4.5 mg Versed, 125 mcg Fentanyl were administered intravenously. The patient's vital signs were monitored continuously by radiology nursing throughout the procedure.  Sedation Time: 34 minutes  MEDICATIONS: 2 g Ancef administered intravenously  FLUOROSCOPY TIME:  4 min 18 seconds 391 mGy  CONTRAST:  179mL OMNIPAQUE IOHEXOL 300 MG/ML  SOLN  TECHNIQUE: Informed consent was obtained from the patient following explanation of the procedure, risks, benefits and  alternatives. The patient understands, agrees and consents for the procedure. All questions were addressed. A time out was performed.  Maximal barrier sterile technique utilized including caps, mask, sterile gowns, sterile gloves, large sterile drape, hand hygiene, and Betadine skin prep.  The right groin was interrogated with ultrasound. The patient has a high common femoral bifurcation. An image was obtained and stored for the medical record. Local anesthesia was attained by infiltration with 1% lidocaine. A small dermatotomy was made. Under real-time sonographic guidance, the common femoral artery was punctured just above the bifurcation. The 3 [place] inner portion of the a micropuncture sheath was advanced over the micro wire and a right common femoral arteriogram was performed confirming that the arterial puncture was below the inferior genu of the inferior epigastric artery and therefore below the inguinal ligament.  The micro sheath was then used to exchange the micro wire for a 0.035 Bentson wire. A C2 cobra catheter was advanced into the abdominal aorta an used to select the superior mesenteric artery. A superior mesenteric arteriogram was performed. There is a marked hypertrophy of the inferior pancreaticoduodenal arcade as well as a direct communication between the inferior pancreaticoduodenal arcade and the splenic artery consistent with an arc of Buhler. There is retrograde filling of the branches of the celiac artery. There is a high-grade stenosis versus occlusion at the origin of the celiac artery.  A repeat arteriography was performed to include the inferior branches. There is no definite evidence of active extravasation. There is some increased density in the lateral margin of the ascending colon in the region of the recent bleeding. Therefore, the C2 catheter was advanced over a glidewire and positioned in the common trunk of the ileocolic and right gastric arteries. Repeat arteriography was  performed in multiple obliquities which demonstrated in the density to represent overlapping of vessels. Despite multiple angiographic runs, no evidence of bleeding or irregularity was identified.  The C2 catheter was removed over a wire and a RIM catheter was introduced. The rim was used to select the inferior mesenteric artery and inferior mesenteric arteriogram was performed. No evidence of bleeding or vascular abnormality.  Hemostasis stress then the catheter was removed. Hemostasis was obtained with the assistance of a Cordis ExoSeal extra arterial collagen plug.  COMPLICATIONS: None  IMPRESSION: 1. Mesenteric arteriography is a negative for evidence of active bleeding or focal vascular abnormality that would suggest a source for bleeding. 2. 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 3. 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  PLAN: If the patient again developed significant  acute lower GI bleeding resulting in hemodynamic changes (tachycardia or hypotension) repeat arteriography may be considered in an effort to identify and embolize the intermittent bleeding source.  In the setting of persistent intermittent low volume bleeding, the without hemodynamic changes, repeat arteriography is unlikely to be of benefit.  Signed,  Criselda Peaches, MD  [facility]  [facility]   Electronically Signed   By: Jacqulynn Cadet M.D.   On: 01/25/2014 18:08   Ir US Guide Vasc Access Right  01/25/2014   CLINICAL DATA:  81 year old female with the lower GI bleed. CT of the abdomen and pelvis with contrast on 01/24/2014 demonstrated findings suggestive of extravasation of contrast material in the ascending colon. Patient was treated conservatively and did well until developing recurrent GI  bleeding last night. A tagged nuclear medicine red blood cell study was positive for bleeding in the mid ascending colon. The patient now presents to Interventional Radiology for catheter angiography and possible embolization if the bleeding source is identified.  EXAM: SELECTIVE VISCERAL ARTERIOGRAPHY; ADDITIONAL ARTERIOGRAPHY; IR ULTRASOUND GUIDANCE VASC ACCESS RIGHT  Date: 01/25/2014  PROCEDURE: 1. Ultrasound-guided puncture of the right common femoral artery 2. Catheterization of the superior mesenteric artery with arteriogram 3. Catheterization of the ileal colloid artery with arteriogram 4. Catheterization of the inferior mesenteric artery with arteriogram 5. Limited right common femoral arteriogram 6. Arterial closure with Cordis ExoSeal Interventional Radiologist:  Criselda Peaches, MD  ANESTHESIA/SEDATION: Moderate (conscious) sedation was used. 4.5 mg Versed, 125 mcg Fentanyl were administered intravenously. The patient's vital signs were monitored continuously by radiology nursing throughout the procedure.  Sedation Time: 34 minutes  MEDICATIONS: 2 g Ancef administered intravenously  FLUOROSCOPY TIME:  4 min 18 seconds 391 mGy  CONTRAST:  140mL OMNIPAQUE IOHEXOL 300 MG/ML  SOLN  TECHNIQUE: Informed consent was obtained from the patient following explanation of the procedure, risks, benefits and alternatives. The patient understands, agrees and consents for the procedure. All questions were addressed. A time out was performed.  Maximal barrier sterile technique utilized including caps, mask, sterile gowns, sterile gloves, large sterile drape, hand hygiene, and Betadine skin prep.  The right groin was interrogated with ultrasound. The patient has a high common femoral bifurcation. An image was obtained and stored for the medical record. Local anesthesia was attained by infiltration with 1% lidocaine. A small dermatotomy was made. Under real-time sonographic guidance, the common femoral artery was  punctured just above the bifurcation. The 3 [place] inner portion of the a micropuncture sheath was advanced over the micro wire and a right common femoral arteriogram was performed confirming that the arterial puncture was below the inferior genu of the inferior epigastric artery and therefore below the inguinal ligament.  The micro sheath was then used to exchange the micro wire for a 0.035 Bentson wire. A C2 cobra catheter was advanced into the abdominal aorta an used to select the superior mesenteric artery. A superior mesenteric arteriogram was performed. There is a marked hypertrophy of the inferior pancreaticoduodenal arcade as well as a direct communication between the inferior pancreaticoduodenal arcade and the splenic artery consistent with an arc of Buhler. There is retrograde filling of the branches of the celiac artery. There is a high-grade stenosis versus occlusion at the origin of the celiac artery.  A repeat arteriography was performed to include the inferior branches. There is no definite evidence of active extravasation. There is some increased density in the lateral margin of the ascending colon in the region  of the recent bleeding. Therefore, the C2 catheter was advanced over a glidewire and positioned in the common trunk of the ileocolic and right gastric arteries. Repeat arteriography was performed in multiple obliquities which demonstrated in the density to represent overlapping of vessels. Despite multiple angiographic runs, no evidence of bleeding or irregularity was identified.  The C2 catheter was removed over a wire and a RIM catheter was introduced. The rim was used to select the inferior mesenteric artery and inferior mesenteric arteriogram was performed. No evidence of bleeding or vascular abnormality.  Hemostasis stress then the catheter was removed. Hemostasis was obtained with the assistance of a Cordis ExoSeal extra arterial collagen plug.  COMPLICATIONS: None  IMPRESSION: 1.  Mesenteric arteriography is a negative for evidence of active bleeding or focal vascular abnormality that would suggest a source for bleeding. 2. 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 3. 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  PLAN: If the patient again developed significant acute lower GI bleeding resulting in hemodynamic changes (tachycardia or hypotension) repeat arteriography may be considered in an effort to identify and embolize the intermittent bleeding source.  In the setting of persistent intermittent low volume bleeding, the without hemodynamic changes, repeat arteriography is unlikely to be of benefit.  Signed,  Criselda Peaches, MD  [facility]  [facility]   Electronically Signed   By: Jacqulynn Cadet M.D.   On: 01/25/2014 18:08      Assessment: Lower GI bleed, seems to have stopped Diarrhea Eosinophilia workup negative so far  Plan: Will plan EGD and colonoscopy with biopsies tomorrow.    April [name] 01/27/2014, 9:18 AM

## 2014-01-27 NOTE — Progress Notes (Signed)
Small brown bowel movement.

## 2014-01-27 NOTE — Progress Notes (Signed)
3 small (quarter sized) dark/bloody BMs. After 2200 miralax 2 small, dark and loose BMs followed by 2 medium sized brown, watery stools. BMs more spaced out this am than last night.

## 2014-01-27 NOTE — Evaluation (Signed)
Physical Therapy Evaluation Patient Details Name: April [name] Whetsel MRN: [id number] DOB: 1925-11-17 Today's Date: 01/27/2014   History of Present Illness  81yo female adm 01/24/14 with lower GIB; PMHx: HTN  Clinical Impression  Pt seen for PT eval this am, no further PT needs at this time; Pt is very active at baseline; HR up to 126 during amb, returned to WNL with standing rest;    Follow Up Recommendations No PT follow up    Equipment Recommendations  None recommended by PT    Recommendations for Other Services       Precautions / Restrictions Restrictions Weight Bearing Restrictions: No      Mobility  Bed Mobility Overal bed mobility: Independent                Transfers Overall transfer level: Independent                  Ambulation/Gait Ambulation/Gait assistance: Independent Ambulation Distance (Feet): 160 Feet Assistive device: None Gait Pattern/deviations: WFL(Within Functional Limits)     General Gait Details: no LOB with turns, stops; no hx of falls per pt  Stairs            Wheelchair Mobility    Modified Rankin (Stroke Patients Only)       Balance Overall balance assessment: Independent                                           Pertinent Vitals/Pain Pain Assessment: No/denies pain    Home Living Family/patient expects to be discharged to:: Private residence Living Arrangements: Spouse/significant other           Home Layout: One level Home Equipment: None      Prior Function Level of Independence: Independent         Comments: Pt and husband reside at [place]; she is quite  active/I at baseline and does a great deal of Tai Chi     Hand Dominance        Extremity/Trunk Assessment   Upper Extremity Assessment: Defer to OT evaluation           Lower Extremity Assessment: Overall WFL for tasks assessed         Communication   Communication: No difficulties  Cognition  Arousal/Alertness: Awake/alert Behavior During Therapy: WFL for tasks assessed/performed Overall Cognitive Status: Within Functional Limits for tasks assessed                      General Comments      Exercises        Assessment/Plan    PT Assessment Patent does not need any further PT services  PT Diagnosis Difficulty walking   PT Problem List    PT Treatment Interventions     PT Goals (Current goals can be found in the Care Plan section) Acute Rehab PT Goals Patient Stated Goal: to return to active lifestyle    Frequency     Barriers to discharge        Co-evaluation               End of Session Equipment Utilized During Treatment: Gait belt Activity Tolerance: Patient tolerated treatment well Patient left: in chair;with call bell/phone within reach           Time: 1002-1026 PT Time Calculation (min) (ACUTE ONLY): 24 min  Charges:   PT Evaluation $Initial PT Evaluation Tier I: 1 Procedure PT Treatments $Gait Training: 8-22 mins   PT G Codes:        Loie Jahr 02/04/2014, 11:26 AM

## 2014-01-27 NOTE — Progress Notes (Signed)
Hand off report called to Vanita Ingles, Therapist, sports.  Patient transported via wheelchair to room 1439.  Family notified of room number.

## 2014-01-27 NOTE — Progress Notes (Signed)
Medium brown stool.

## 2014-01-28 ENCOUNTER — Encounter (HOSPITAL_COMMUNITY)
Admission: EM | Disposition: A | Discharge status: Skilled Nursing Facility | Payer: Self-pay | Source: Home / Self Care | Attending: Internal Medicine

## 2014-01-28 ENCOUNTER — Encounter (HOSPITAL_COMMUNITY): Payer: Self-pay | Admitting: Gastroenterology

## 2014-01-28 HISTORY — PX: COLONOSCOPY: SHX5424

## 2014-01-28 HISTORY — PX: ESOPHAGOGASTRODUODENOSCOPY: SHX5428

## 2014-01-28 LAB — URINE CULTURE: Colony Count: >=100000

## 2014-01-28 LAB — TYPE AND SCREEN
ABO/RH(D): B NEG
Antibody Screen: NEGATIVE
UNIT DIVISION: 0
UNIT DIVISION: 0
UNIT DIVISION: 0
Unit division: 0

## 2014-01-28 LAB — BASIC METABOLIC PANEL
Anion gap: 9 (ref 5–15)
BUN: 8 mg/dL (ref 6–23)
CALCIUM: 9 mg/dL (ref 8.4–10.5)
CHLORIDE: 107 meq/L (ref 96–112)
CO2: 23 mmol/L (ref 19–32)
CREATININE: 0.83 mg/dL (ref 0.50–1.10)
GFR calc Af Amer: 72 mL/min — ABNORMAL LOW (ref >90)
GFR, EST NON AFRICAN AMERICAN: 62 mL/min — AB (ref >90)
Glucose, Bld: 113 mg/dL — ABNORMAL HIGH (ref 70–99)
POTASSIUM: 3.3 mmol/L — AB (ref 3.5–5.1)
SODIUM: 139 mmol/L (ref 135–145)

## 2014-01-28 LAB — CBC
HEMATOCRIT: 33 % — AB (ref 36.0–46.0)
HEMOGLOBIN: 10.6 g/dL — AB (ref 12.0–15.0)
MCH: 27.6 pg (ref 26.0–34.0)
MCHC: 32.1 g/dL (ref 30.0–36.0)
MCV: 85.9 fL (ref 78.0–100.0)
PLATELETS: 201 10*3/uL (ref 150–400)
RBC: 3.84 MIL/uL — ABNORMAL LOW (ref 3.87–5.11)
RDW: 17.2 % — AB (ref 11.5–15.5)
WBC: 32.1 10*3/uL — ABNORMAL HIGH (ref 4.0–10.5)

## 2014-01-28 LAB — CEA: CEA: 14.6 ng/mL — ABNORMAL HIGH (ref 0.0–5.0)

## 2014-01-28 SURGERY — COLONOSCOPY
Anesthesia: Moderate Sedation

## 2014-01-28 SURGERY — EGD (ESOPHAGOGASTRODUODENOSCOPY)
Anesthesia: Moderate Sedation

## 2014-01-28 MED ORDER — POTASSIUM CHLORIDE CRYS ER 20 MEQ PO TBCR
40.0000 meq | EXTENDED_RELEASE_TABLET | Freq: Once | ORAL | Status: AC
  Administered 2014-01-28: 40 meq via ORAL
  Filled 2014-01-28: qty 2

## 2014-01-28 MED ORDER — FENTANYL CITRATE 0.05 MG/ML IJ SOLN
INTRAMUSCULAR | Status: DC | PRN
  Administered 2014-01-28 (×4): 25 ug via INTRAVENOUS

## 2014-01-28 MED ORDER — MIDAZOLAM HCL 10 MG/2ML IJ SOLN
INTRAMUSCULAR | Status: AC
  Filled 2014-01-28: qty 2

## 2014-01-28 MED ORDER — MIDAZOLAM HCL 5 MG/5ML IJ SOLN
INTRAMUSCULAR | Status: DC | PRN
  Administered 2014-01-28: 1 mg via INTRAVENOUS
  Administered 2014-01-28: 2 mg via INTRAVENOUS
  Administered 2014-01-28 (×2): 1 mg via INTRAVENOUS

## 2014-01-28 MED ORDER — BUTAMBEN-TETRACAINE-BENZOCAINE 2-2-14 % EX AERO
INHALATION_SPRAY | CUTANEOUS | Status: DC | PRN
  Administered 2014-01-28 (×2): 1 via TOPICAL

## 2014-01-28 MED ORDER — SODIUM CHLORIDE 0.9 % IV SOLN
INTRAVENOUS | Status: DC
  Administered 2014-01-28: 500 mL via INTRAVENOUS

## 2014-01-28 MED ORDER — FENTANYL CITRATE 0.05 MG/ML IJ SOLN
INTRAMUSCULAR | Status: AC
  Filled 2014-01-28: qty 2

## 2014-01-28 MED ORDER — PANTOPRAZOLE SODIUM 40 MG PO TBEC
40.0000 mg | DELAYED_RELEASE_TABLET | Freq: Every day | ORAL | Status: DC
  Administered 2014-01-28 – 2014-01-29 (×2): 40 mg via ORAL
  Filled 2014-01-28 (×3): qty 1

## 2014-01-28 MED ORDER — CEPHALEXIN 500 MG PO CAPS
500.0000 mg | ORAL_CAPSULE | Freq: Three times a day (TID) | ORAL | Status: DC
  Administered 2014-01-28 – 2014-01-29 (×3): 500 mg via ORAL
  Filled 2014-01-28 (×6): qty 1

## 2014-01-28 MED ORDER — SODIUM CHLORIDE 0.9 % IV SOLN: INTRAVENOUS | Status: DC

## 2014-01-28 MED ORDER — MIDAZOLAM HCL 10 MG/2ML IJ SOLN
INTRAMUSCULAR | Status: DC | PRN
  Administered 2014-01-28: 2 mg via INTRAVENOUS

## 2014-01-28 MED ORDER — POTASSIUM CHLORIDE 10 MEQ/100ML IV SOLN
10.0000 meq | INTRAVENOUS | Status: AC
  Administered 2014-01-28 (×4): 10 meq via INTRAVENOUS
  Filled 2014-01-28 (×4): qty 100

## 2014-01-28 NOTE — Op Note (Signed)
[facility] [place], [id number]   COLONOSCOPY PROCEDURE REPORT     EXAM DATE: 2014-01-29  PATIENT NAME:      April [name], April [name]           MR #:      [id number]  BIRTHDATE:       [birth year]      VISIT #:     [phone]  ATTENDING:     Teena Irani, MD     STATUS:     outpatient REFERRING MD: ASA CLASS:  INDICATIONS:  The patient is a 81 yr old female here for a colonoscopy due to PROCEDURE PERFORMED:     colonoscopy MEDICATIONS:     fentanyl 50 g, Versed 3 mg ESTIMATED BLOOD LOSS:     None  CONSENT: The patient understands the risks and benefits of the procedure and understands that these risks include, but are not limited to: sedation, allergic reaction, infection, perforation and/or bleeding. Alternative means of evaluation and treatment include, among others: physical exam, x-rays, and/or surgical intervention. The patient elects to proceed with this endoscopic procedure.  DESCRIPTION OF PROCEDURE: During intra-op preparation period all mechanical & medical equipment was checked for proper function. Hand hygiene and appropriate measures for infection prevention was taken. After the risks, benefits and alternatives of the procedure were thoroughly explained, Informed consent was verified, confirmed and timeout was successfully executed by the treatment team. A digital exam The Pentax Ped Colon [id number] endoscope was introduced through the anus and advanced to the cecum     . No adverse events experienced. The prep was good     . The instrument was then slowly withdrawn as the colon was fully examined. the cecum was somewhat poorly seen but appeared normal. There was a large friable semicircumferential mass across from and just distal to the ileocecal valve consistent with carcinoma. Multiple biopsies were taken. Scattered diverticuli throughout the remainder of the colon with no active bleeding although some oozing around the tumor when  first visualized. No other masses were seen and no visible inflammation or polyps seen. Rectum appeared normal retroflexion was unremarkable.       The scope was then completely withdrawn from the patient and the procedure terminated. WITHDRAWAL TIME:    ADVERSE EVENTS:      There were no immediate complications.  IMPRESSIONS:     proximal ascending colon mass across from the ileocecal valve highly suspicious for carcinoma or possibly lymphoma  RECOMMENDATIONS:     [facility]. Surgical consultation also await histology on duodenal plaque like mass which may represent an adenoma but is probably not malignant RECALL:  Teena Irani, MD eSigned:  Teena Irani, MD January 29, 2014 10:33 AM   cc:  CPT CODES: ICD CODES:  The ICD and CPT codes recommended by this software are interpretations from the data that the clinical staff has captured with the software.  The verification of the translation of this report to the ICD and CPT codes and modifiers is the sole responsibility of the health care institution and practicing physician where this report was generated.  [employer]. will not be held responsible for the validity of the ICD and CPT codes included on this report.  AMA assumes no liability for data contained or not contained herein. CPT is a Designer, television/film set of the [employer].

## 2014-01-28 NOTE — Interval H&P Note (Signed)
History and Physical Interval Note:  01/28/2014 9:46 AM  April [name]  has presented today for surgery, with the diagnosis of GI bleeding and diarrhea  The various methods of treatment have been discussed with the patient and family. After consideration of risks, benefits and other options for treatment, the patient has consented to  Procedure(s): COLONOSCOPY (N/A) ESOPHAGOGASTRODUODENOSCOPY (EGD) (N/A) as a surgical intervention .  The patient's history has been reviewed, patient examined, no change in status, stable for surgery.  I have reviewed the patient's chart and labs.  Questions were answered to the patient's satisfaction.     Nirvi Boehler C

## 2014-01-28 NOTE — Op Note (Signed)
[place], [id number]   ENDOSCOPY PROCEDURE REPORT  PATIENT: April, [name]  MR#: [id number] BIRTHDATE: 19-Mar-1925 , 57  yrs. old GENDER: female ENDOSCOPIST: Teena Irani, MD REFERRED BY: PROCEDURE DATE:  February 13, 2014 PROCEDURE: ASA CLASS: INDICATIONS:  diarrhea and GI bleeding MEDICATIONS: 50 g fentanyl, 4 mg Versed TOPICAL ANESTHETIC:  DESCRIPTION OF PROCEDURE: After the risks benefits and alternatives of the procedure were thoroughly explained, informed consent was obtained.  The [place] [id number] endoscope was introduced through the mouth and advanced to the second portion of the duodenum , Without limitations.  The instrument was slowly withdrawn as the mucosa was fully examined.   esophagus: Normal stomach: Fine antral granularity and edema, biopsies taken to rule out eosinophilic gastritis, Duodenum: Whitish flat carpet-like mass versus inflammation beginning in the distal bulb into the second portion, semicircumferential, suspicious for adenoma, biopsied.. post bulbar duodenum normal but biopsied to rule out eosinophilic enteritis or celiac disease      Duodenal plaquelike mass.         The scope was then withdrawn from the patient and the procedure completed.  COMPLICATIONS: There were no immediate complications.  ENDOSCOPIC IMPRESSION: plaque-like verrucal flat mass versus inflammation in the distal bulb and proximal second portion of the duodenum, could be inflammatory or neoplastic, suspicious for adenoma Mild gastritis.  RECOMMENDATIONS: await all biopsies and will proceed with colonoscopy  REPEAT EXAM:  eSigned:  Teena Irani, MD 02-13-2014 10:06 AM    CC:  CPT CODES: ICD CODES:  The ICD and CPT codes recommended by this software are interpretations from the data that the clinical staff has captured with the software.  The verification of the translation of this report to the ICD and CPT  codes and modifiers is the sole responsibility of the health care institution and practicing physician where this report was generated.  [employer]. will not be held responsible for the validity of the ICD and CPT codes included on this report.  AMA assumes no liability for data contained or not contained herein. CPT is a Designer, television/film set of the [employer].  PATIENT NAME:  April, [name] MR#: [id number]

## 2014-01-28 NOTE — Progress Notes (Signed)
[facility] Progress Note  Subjective: Patient tolerated bowel prep with clearance of stool and blood  Objective: Vital signs in last 24 hours: Temp:  [96.6 F (35.9 C)-98.9 F (37.2 C)] 98.9 F (37.2 C) (01/03 1026) Pulse Rate:  [88-113] 93 (01/03 1026) Resp:  [16-26] 16 (01/03 1026) BP: (108-157)/(41-91) 118/48 mmHg (01/03 1026) SpO2:  [97 %-100 %] 99 % (01/03 1026) Weight change:    PE: Unchanged  Lab Results: Results for orders placed or performed during the hospital encounter of 01/24/14 (from the past 24 hour(s))  CBC     Status: Abnormal   Collection Time: 01/27/14  2:20 PM  Result Value Ref Range   WBC 28.5 (H) 4.0 - 10.5 K/uL   RBC 3.86 (L) 3.87 - 5.11 MIL/uL   Hemoglobin 10.6 (L) 12.0 - 15.0 g/dL   HCT 32.6 (L) 36.0 - 46.0 %   MCV 84.5 78.0 - 100.0 fL   MCH 27.5 26.0 - 34.0 pg   MCHC 32.5 30.0 - 36.0 g/dL   RDW 16.6 (H) 11.5 - 15.5 %   Platelets 169 150 - 400 K/uL  CBC     Status: Abnormal   Collection Time: 01/28/14  6:08 AM  Result Value Ref Range   WBC 32.1 (H) 4.0 - 10.5 K/uL   RBC 3.84 (L) 3.87 - 5.11 MIL/uL   Hemoglobin 10.6 (L) 12.0 - 15.0 g/dL   HCT 33.0 (L) 36.0 - 46.0 %   MCV 85.9 78.0 - 100.0 fL   MCH 27.6 26.0 - 34.0 pg   MCHC 32.1 30.0 - 36.0 g/dL   RDW 17.2 (H) 11.5 - 15.5 %   Platelets 201 150 - 400 K/uL  Basic metabolic panel     Status: Abnormal   Collection Time: 01/28/14  6:08 AM  Result Value Ref Range   Sodium 139 135 - 145 mmol/L   Potassium 3.3 (L) 3.5 - 5.1 mmol/L   Chloride 107 96 - 112 mEq/L   CO2 23 19 - 32 mmol/L   Glucose, Bld 113 (H) 70 - 99 mg/dL   BUN 8 6 - 23 mg/dL   Creatinine, Ser 0.83 0.50 - 1.10 mg/dL   Calcium 9.0 8.4 - 10.5 mg/dL   GFR calc non Af Amer 62 (L) >90 mL/min   GFR calc Af Amer 72 (L) >90 mL/min   Anion gap 9 5 - 15    Studies/Results: No results found.  Colonoscopy revealed a large proximal ascending colon mass, likely the source of bleeding found on pulled RBC scan, a few  scattered diverticuli throughout the colon not bleeding. Multiple biopsies taken  EGD showed a flat plaque-like polypoid mass versus inflammation in the distal duodenal bulb extending into the proximal second portion. Multiple biopsies were taken  Assessment: Ascending colon mass likely malignant Duodenal plaque-like polypoid mass suspect adenoma No other source of bleeding or inflammatory findings found although random biopsies taken of the distal duodenum and stomach because of her eosinophilia which is probably related to her tumor  Plan: [facility] pathology on a descending colon mass and duodenal biopsies Keep on clear liquid diet for now Obtained CEA level Review CT scan for adenopathy Surgical consult. Will continue to follow     April [name] C 01/28/2014, 10:37 AM

## 2014-01-28 NOTE — Progress Notes (Signed)
[facility] PROGRESS NOTE  April [name] YQI:[id number] DOB: 07-Apr-1925 DOA: 01/24/2014  PCP: Tommy Medal, MD  Brief HPI: 81 year old Caucasian female presented with rectal bleeding. She was transfused PRBCs. CT scan suggested bleeding from angiodysplasia. She was admitted for further management  Past medical history:  Past Medical History  Diagnosis Date  . Hypertension   . History of breast cancer 2011    DCIS, Right breast. No radiation, took Aromasen 2012  . Senile osteoporosis     Took Fosamax x15years  . Cancer     Consultants: Gastroenterology and interventional radiology  Procedures:  Visceral angiogram on December 31  Plan is for EGD/Colonoscopy 1/3  Antibiotics: None  Subjective: Patient denies any further bleeding. She's had brown stool. Denies abdominal pain.  Objective: Vital Signs  Filed Vitals:   01/27/14 1155 01/27/14 1322 01/27/14 2120 01/28/14 0527  BP: 128/46 122/49 142/78 108/41  Pulse: 94 88 113 93  Temp:  98.1 F (36.7 C) 96.6 F (35.9 C) 97.9 F (36.6 C)  TempSrc:  Oral Axillary Oral  Resp: 23 18 20 20   Height:      Weight:      SpO2: 100% 99% 100% 98%    Intake/Output Summary (Last 24 hours) at 01/28/14 0856 Last data filed at 01/27/14 1200  Gross per 24 hour  Intake      0 ml  Output      0 ml  Net      0 ml   Filed Weights   01/25/14 0100  Weight: 68.5 kg (151 lb 0.2 oz)    General appearance: alert, cooperative Resp: clear to auscultation bilaterally Cardio: regular rate and rhythm, S1, S2 normal, no murmur, click, rub or gallop GI: soft, non-tender; bowel sounds normal; no masses,  no organomegaly Extremities: extremities normal, atraumatic, no cyanosis or edema Neurologic: No focal deficits  Lab Results:  Basic Metabolic Panel:  Recent Labs Lab 01/24/14 1837 01/24/14 2320 01/25/14 0643 01/26/14 0340 01/27/14 0242 01/28/14 0608  NA 141 139 140 142 139 139  K 3.8 3.8 4.1 3.7 3.5 3.3*  CL 108 105  112 115* 109 107  CO2 25  --  23 23 24 23   GLUCOSE 112* 144* 117* 118* 103* 113*  BUN 21 18 13 8  5* 8  CREATININE 0.72 0.80 0.63 0.66 0.69 0.83  CALCIUM 8.9  --  7.8* 8.0* 8.6 9.0  MG  --   --  1.8  --   --   --   PHOS  --   --  3.2  --   --   --    Liver Function Tests:  Recent Labs Lab 01/25/14 0643  AST 16  ALT 12  ALKPHOS 45  BILITOT 1.0  PROT 4.9*  ALBUMIN 3.2*   CBC:  Recent Labs Lab 01/24/14 1837  01/26/14 1503 01/26/14 2108 01/27/14 0242 01/27/14 1420 01/28/14 0608  WBC 23.1*  < > 22.2* 19.8* 21.6* 28.5* 32.1*  NEUTROABS 10.1*  --   --   --   --   --   --   HGB 10.7*  < > 9.6* 9.1* 9.7* 10.6* 10.6*  HCT 33.8*  < > 28.8* 27.4* 29.5* 32.6* 33.0*  MCV 80.3  < > 83.0 83.5 83.6 84.5 85.9  PLT 172  < > 144* 140* 156 169 201  < > = values in this interval not displayed.  Recent Results (from the past 240 hour(s))  MRSA PCR Screening     Status: None  Collection Time: 01/25/14 12:59 AM  Result Value Ref Range Status   MRSA by PCR NEGATIVE NEGATIVE Final    Comment:        The GeneXpert MRSA Assay (FDA approved for NASAL specimens only), is one component of a comprehensive MRSA colonization surveillance program. It is not intended to diagnose MRSA infection nor to guide or monitor treatment for MRSA infections.       Studies/Results: No results found.  Medications:  Scheduled: . antiseptic oral rinse  7 mL Mouth Rinse QID  . chlorhexidine  15 mL Mouth Rinse BID  . pantoprazole  40 mg Oral QHS  . polyethylene glycol  17 g Oral TID  . potassium chloride  10 mEq Intravenous Q1 Hr x 4  . potassium chloride  40 mEq Oral Once  . sodium chloride  3 mL Intravenous Q12H   Continuous: . sodium chloride     OAC:[id number] **OR** acetaminophen, HYDROcodone-acetaminophen, ondansetron **OR** ondansetron (ZOFRAN) IV  Assessment/Plan:  Principal Problem:   Colonic hemorrhage Active Problems:   hx: breast cancer, DCIS, right, receptor +   Thickened  endometrium   Acute blood loss anemia   Rectal bleeding    Hematochezia  The nuclear scan did identify an area of concern in the mid ascending colon. Angiogram was done however, could not identify the bleeding vessel.  it appears that her bleeding has stopped. She will undergo EGD and colonoscopy today. Hemoglobin remained stable.  Leukocytosis with eosinophilia  WBC remains elevated. Apparently this has been worked up as an outpatient. Significant eosinophillia is noted. She is afebrile. No further workup during this hospitalization.  Acute blood loss anemia  She has been transfused 3 units of blood but none in the last 2-3 days. Hemoglobin remains stable.   Thickened endometrium This was incidentally noted on the CT during this admission, as well as a previous CT scan done in September. Had a long discussion with the patient regarding the same. She denies any vaginal bleeding. I have asked her to assist discuss this further with her primary care physician. She may need to see a gynecologist as well. This will need to be pursued as an outpatient.  DVT Prophylaxis: SCDs.   Code Status: Full code  Family Communication: Discussed with the patient along with her husband and 2 daughters Disposition Plan: Seen by PT. No need for home therapy.     LOS: 4 days   [facility] Pager [phone] 01/28/2014, 8:56 AM  If 7PM-7AM, please contact night-coverage at [email or web address], password [facility]

## 2014-01-28 NOTE — H&P (View-Only) (Signed)
OT Note  Patient Details Name: April [name] MRN: [id number] DOB: 08/22/1925   Cancelled Treatment:    Reason Eval/Treat Not Completed: OT screened, no needs identified, will sign off  Dewain Platz A 01/27/2014, 1:18 PM

## 2014-01-28 NOTE — Consult Note (Signed)
General Surgery [facility]  Reason for Consult: ascending colon mass (likely carcinoma), duodenal polyp  Referring Physician: Dr. Teena Irani, [facility]  April [name] is an 81 y.o. female.   HPI: patient is an 81 yo WF known to my practice from lap chole in 2011 and breast surgery in 2011.  Patient presented 12/30 with painless bleeding per rectum.  Has received 3U PRBC's during hospitalization.  Nuclear scan positive for bleeding, but angio unable to localize.  Colonoscopy this AM shows mass in ascending colon consistent with carcinoma.  Patient also underwent EGD showing an abnormality in the duodenum.  Biopsies of the colon and duodenum were submitted to pathology.  Patient has significant leukocytosis with eosinophilia.  Has been evaluated by allergist and hematologist.  General surgery consulted for probable right colectomy during this admission.  Past Medical History  Diagnosis Date  . Hypertension   . History of breast cancer 2011    DCIS, Right breast. No radiation, took Aromasen 2012  . Senile osteoporosis     Took Fosamax x15years  . Cancer     Past Surgical History  Procedure Laterality Date  . Laparoscopic cholecystectomy  05/26/2009  . Breast lumpectomy  12/24/2009    Right, Dr Margot Chimes  . Cataract extraction w/ intraocular lens  implant, bilateral Bilateral 3212,2482  . Tonsillectomy and adenoidectomy      Family History  Problem Relation Age of Onset  . Heart disease Mother   . Heart disease Father   . Diabetes Father   . Cancer Paternal Aunt     breast    Social History:  reports that she has never smoked. She has never used smokeless tobacco. She reports that she drinks alcohol. She reports that she does not use illicit drugs.  Allergies:  Allergies  Allergen Reactions  . Codeine   . Sulfa Antibiotics     Medications: I have reviewed the patient's current medications.  Results for orders placed or performed during  the hospital encounter of 01/24/14 (from the past 48 hour(s))  CBC     Status: Abnormal   Collection Time: 01/26/14  9:08 PM  Result Value Ref Range   WBC 19.8 (H) 4.0 - 10.5 K/uL   RBC 3.28 (L) 3.87 - 5.11 MIL/uL   Hemoglobin 9.1 (L) 12.0 - 15.0 g/dL   HCT 27.4 (L) 36.0 - 46.0 %   MCV 83.5 78.0 - 100.0 fL   MCH 27.7 26.0 - 34.0 pg   MCHC 33.2 30.0 - 36.0 g/dL   RDW 16.1 (H) 11.5 - 15.5 %   Platelets 140 (L) 150 - 400 K/uL  CBC     Status: Abnormal   Collection Time: 01/27/14  2:42 AM  Result Value Ref Range   WBC 21.6 (H) 4.0 - 10.5 K/uL   RBC 3.53 (L) 3.87 - 5.11 MIL/uL   Hemoglobin 9.7 (L) 12.0 - 15.0 g/dL   HCT 29.5 (L) 36.0 - 46.0 %   MCV 83.6 78.0 - 100.0 fL   MCH 27.5 26.0 - 34.0 pg   MCHC 32.9 30.0 - 36.0 g/dL   RDW 16.3 (H) 11.5 - 15.5 %   Platelets 156 150 - 400 K/uL  Basic metabolic panel     Status: Abnormal   Collection Time: 01/27/14  2:42 AM  Result Value Ref Range   Sodium 139 135 - 145 mmol/L    Comment: Please note change in reference range.   Potassium 3.5 3.5 - 5.1 mmol/L  Comment: Please note change in reference range.   Chloride 109 96 - 112 mEq/L   CO2 24 19 - 32 mmol/L   Glucose, Bld 103 (H) 70 - 99 mg/dL   BUN 5 (L) 6 - 23 mg/dL   Creatinine, Ser 0.69 0.50 - 1.10 mg/dL   Calcium 8.6 8.4 - 10.5 mg/dL   GFR calc non Af Amer 77 (L) >90 mL/min   GFR calc Af Amer 89 (L) >90 mL/min    Comment: (NOTE) The eGFR has been calculated using the CKD EPI equation. This calculation has not been validated in all clinical situations. eGFR's persistently <90 mL/min signify possible Chronic Kidney Disease.    Anion gap 6 5 - 15  CBC     Status: Abnormal   Collection Time: 01/27/14  2:20 PM  Result Value Ref Range   WBC 28.5 (H) 4.0 - 10.5 K/uL   RBC 3.86 (L) 3.87 - 5.11 MIL/uL   Hemoglobin 10.6 (L) 12.0 - 15.0 g/dL   HCT 32.6 (L) 36.0 - 46.0 %   MCV 84.5 78.0 - 100.0 fL   MCH 27.5 26.0 - 34.0 pg   MCHC 32.5 30.0 - 36.0 g/dL   RDW 16.6 (H) 11.5 - 15.5 %    Platelets 169 150 - 400 K/uL  CBC     Status: Abnormal   Collection Time: 01/28/14  6:08 AM  Result Value Ref Range   WBC 32.1 (H) 4.0 - 10.5 K/uL    Comment: REPEATED TO VERIFY CONSISTENT WITH PREVIOUS RESULT    RBC 3.84 (L) 3.87 - 5.11 MIL/uL   Hemoglobin 10.6 (L) 12.0 - 15.0 g/dL   HCT 33.0 (L) 36.0 - 46.0 %   MCV 85.9 78.0 - 100.0 fL   MCH 27.6 26.0 - 34.0 pg   MCHC 32.1 30.0 - 36.0 g/dL   RDW 17.2 (H) 11.5 - 15.5 %   Platelets 201 150 - 400 K/uL  Basic metabolic panel     Status: Abnormal   Collection Time: 01/28/14  6:08 AM  Result Value Ref Range   Sodium 139 135 - 145 mmol/L    Comment: Please note change in reference range.   Potassium 3.3 (L) 3.5 - 5.1 mmol/L    Comment: Please note change in reference range.   Chloride 107 96 - 112 mEq/L   CO2 23 19 - 32 mmol/L   Glucose, Bld 113 (H) 70 - 99 mg/dL   BUN 8 6 - 23 mg/dL   Creatinine, Ser 0.83 0.50 - 1.10 mg/dL   Calcium 9.0 8.4 - 10.5 mg/dL   GFR calc non Af Amer 62 (L) >90 mL/min   GFR calc Af Amer 72 (L) >90 mL/min    Comment: (NOTE) The eGFR has been calculated using the CKD EPI equation. This calculation has not been validated in all clinical situations. eGFR's persistently <90 mL/min signify possible Chronic Kidney Disease.    Anion gap 9 5 - 15    No results found.  Review of Systems  Constitutional: Positive for malaise/fatigue. Negative for fever, chills, weight loss and diaphoresis.  HENT: Negative.   Eyes: Negative.   Respiratory: Negative.   Cardiovascular: Negative.   Gastrointestinal: Positive for diarrhea and blood in stool. Negative for heartburn, nausea, vomiting, abdominal pain and constipation.  Genitourinary: Negative.   Musculoskeletal: Negative.   Skin: Negative.   Neurological: Positive for weakness.  Endo/Heme/Allergies: Negative.        Eosinophilia  Psychiatric/Behavioral:  Irritablility   Blood pressure 104/42, pulse 93, temperature 97.9 F (36.6 C), temperature  source Oral, resp. rate 18, height _0  (1.651 m), weight 151 lb 0.2 oz (68.5 kg), SpO2 98 %. Physical Exam  Constitutional: She is oriented to person, place, and time. She appears well-developed and well-nourished. No distress.  HENT:  Head: Normocephalic and atraumatic.  Right Ear: External ear normal.  Left Ear: External ear normal.  Eyes: Conjunctivae are normal. Pupils are equal, round, and reactive to light. No scleral icterus.  Neck: Normal range of motion. Neck supple. No tracheal deviation present. No thyromegaly present.  Cardiovascular: Normal rate, regular rhythm and normal heart sounds.   No murmur heard. Respiratory: Effort normal and breath sounds normal. She has no wheezes.  GI: Soft. Bowel sounds are normal. She exhibits no distension and no mass. There is no tenderness. There is no rebound and no guarding.  Well healed surgical incisions, no hernia  Musculoskeletal: Normal range of motion. She exhibits no edema or tenderness.  Neurological: She is alert and oriented to person, place, and time.  Skin: Skin is warm and dry. She is not diaphoretic.  Psychiatric: She has a normal mood and affect. Her behavior is normal.    Assessment/Plan: 1.  Right colon mass, likely adenocarcinoma, with recent bleeding  Await biopsy results on colon and duodenum  Maintain bowel prep with clear liquid diet  Will need oral abx prep pre-op  Plan right colectomy - will discuss with Dr. Kaylyn Lim who is our [place] [facility] surgeon this week - I discussed with family at bedside 2.  Duodenal polyp  Await path results prior to laparotomy 3.  Acute blood loss anemia  Hgb 10.6 - continue to monitor  Limit blood draws for labs 4.  Leukocytosis with eosinophilia  Unknown etiology  Earnstine Regal, MD, [facility] Office: [place] 01/28/2014, 5:27 PM

## 2014-01-29 ENCOUNTER — Inpatient Hospital Stay (HOSPITAL_COMMUNITY): Payer: Medicare Other

## 2014-01-29 ENCOUNTER — Encounter (HOSPITAL_COMMUNITY): Payer: Self-pay | Admitting: Gastroenterology

## 2014-01-29 DIAGNOSIS — K6389 Other specified diseases of intestine: Secondary | ICD-10-CM | POA: Diagnosis present

## 2014-01-29 LAB — SURGICAL PCR SCREEN
MRSA, PCR: NEGATIVE
STAPHYLOCOCCUS AUREUS: POSITIVE — AB

## 2014-01-29 LAB — BASIC METABOLIC PANEL
Anion gap: 6 (ref 5–15)
BUN: 5 mg/dL — AB (ref 6–23)
CALCIUM: 8.7 mg/dL (ref 8.4–10.5)
CO2: 26 mmol/L (ref 19–32)
Chloride: 107 mEq/L (ref 96–112)
Creatinine, Ser: 0.64 mg/dL (ref 0.50–1.10)
GFR calc Af Amer: >90 mL/min (ref >90)
GFR calc non Af Amer: 79 mL/min — ABNORMAL LOW (ref >90)
GLUCOSE: 98 mg/dL (ref 70–99)
Potassium: 4.2 mmol/L (ref 3.5–5.1)
SODIUM: 139 mmol/L (ref 135–145)

## 2014-01-29 LAB — DIFFERENTIAL
Basophils Absolute: 0.2 10*3/uL — ABNORMAL HIGH (ref 0.0–0.1)
Basophils Relative: 1 % (ref 0–1)
EOS PCT: 29 % — AB (ref 0–5)
Eosinophils Absolute: 5.4 10*3/uL — ABNORMAL HIGH (ref 0.0–0.7)
Lymphocytes Relative: 10 % — ABNORMAL LOW (ref 12–46)
Lymphs Abs: 1.9 10*3/uL (ref 0.7–4.0)
MONOS PCT: 5 % (ref 3–12)
Monocytes Absolute: 0.9 10*3/uL (ref 0.1–1.0)
NEUTROS PCT: 55 % (ref 43–77)
Neutro Abs: 10.3 10*3/uL — ABNORMAL HIGH (ref 1.7–7.7)

## 2014-01-29 LAB — CBC
HCT: 29.4 % — ABNORMAL LOW (ref 36.0–46.0)
Hemoglobin: 9.4 g/dL — ABNORMAL LOW (ref 12.0–15.0)
MCH: 27.7 pg (ref 26.0–34.0)
MCHC: 32 g/dL (ref 30.0–36.0)
MCV: 86.7 fL (ref 78.0–100.0)
Platelets: 169 10*3/uL (ref 150–400)
RBC: 3.39 MIL/uL — ABNORMAL LOW (ref 3.87–5.11)
RDW: 17.8 % — AB (ref 11.5–15.5)
WBC: 18.7 10*3/uL — AB (ref 4.0–10.5)

## 2014-01-29 MED ORDER — ERYTHROMYCIN BASE 250 MG PO TABS
1000.0000 mg | ORAL_TABLET | Freq: Four times a day (QID) | ORAL | Status: AC
  Administered 2014-01-29 – 2014-01-30 (×3): 1000 mg via ORAL
  Filled 2014-01-29 (×3): qty 4

## 2014-01-29 MED ORDER — NEOMYCIN SULFATE 500 MG PO TABS
1000.0000 mg | ORAL_TABLET | Freq: Four times a day (QID) | ORAL | Status: AC
  Administered 2014-01-29 – 2014-01-30 (×3): 1000 mg via ORAL
  Filled 2014-01-29 (×3): qty 2

## 2014-01-29 MED ORDER — CHLORHEXIDINE GLUCONATE CLOTH 2 % EX PADS
6.0000 | MEDICATED_PAD | Freq: Once | CUTANEOUS | Status: AC
  Administered 2014-01-29: 6 via TOPICAL

## 2014-01-29 MED ORDER — ENOXAPARIN SODIUM 40 MG/0.4ML ~~LOC~~ SOLN
40.0000 mg | Freq: Once | SUBCUTANEOUS | Status: AC
  Administered 2014-01-30: 40 mg via SUBCUTANEOUS
  Filled 2014-01-29: qty 0.4

## 2014-01-29 MED ORDER — DEXTROSE 5 % IV SOLN
2.0000 g | INTRAVENOUS | Status: AC
  Administered 2014-01-30: 2 g via INTRAVENOUS
  Filled 2014-01-29: qty 2

## 2014-01-29 MED ORDER — CHLORHEXIDINE GLUCONATE CLOTH 2 % EX PADS
6.0000 | MEDICATED_PAD | Freq: Once | CUTANEOUS | Status: AC
  Administered 2014-01-30: 6 via TOPICAL

## 2014-01-29 MED ORDER — ALVIMOPAN 12 MG PO CAPS
12.0000 mg | ORAL_CAPSULE | Freq: Once | ORAL | Status: AC
  Administered 2014-01-30: 12 mg via ORAL
  Filled 2014-01-29: qty 1

## 2014-01-29 NOTE — Progress Notes (Signed)
Patient had bowel movement x 1 in toilet. Stoll brown/orange in color, loose in consistency, no frank blood noted, no dark tarry color noted.

## 2014-01-29 NOTE — Progress Notes (Signed)
[facility] PROGRESS NOTE  April [name] ZOX:[id number] DOB: 25-Apr-1925 DOA: 01/24/2014  PCP: Tommy Medal, MD  Brief HPI: 81 year old Caucasian female presented with rectal bleeding. She was transfused PRBCs. CT scan suggested bleeding from angiodysplasia. She was admitted for further management. She she underwent colonoscopy and EGD. Colonic mass was noted along with some abnormal duodenal findings. Biopsies have been obtained. General surgery has been consulted.  Past medical history:  Past Medical History  Diagnosis Date  . Hypertension   . History of breast cancer 2011    DCIS, Right breast. No radiation, took Aromasen 2012  . Senile osteoporosis     Took Fosamax x15years  . Cancer     Consultants: Gastroenterology, interventional radiology, general surgery  Procedures:  Visceral angiogram on December 31 IMPRESSION: 1. Mesenteric arteriography is a negative for evidence of active bleeding or focal vascular abnormality that would suggest a source for bleeding. 2. 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 3. 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  EGD 1/3 ENDOSCOPIC IMPRESSION: Plaque-like verrucal flat mass versus inflammation in the distal bulb and proximal second portion of the duodenum, could be inflammatory or neoplastic, suspicious for adenoma.Mild gastritis.  Colonoscopy 1/3 Proximal ascending colon mass across from the ileocecal valve highly suspicious for carcinoma or possibly lymphoma  Antibiotics: None  Subjective: Patient denies further bleeding. Somewhat anxious regarding her abnormal colonoscopy. Denies abdominal pain.  Objective: Vital Signs  Filed Vitals:   01/28/14 1055 01/28/14 1321 01/28/14 2217 01/29/14 0513  BP: 119/35 104/42 112/43 109/35  Pulse:  92 93 84 86  Temp: 97.6 F (36.4 C) 97.9 F (36.6 C) 97.9 F (36.6 C) 98.1 F (36.7 C)  TempSrc: Oral Oral Oral Oral  Resp: 18 18 18 18   Height:      Weight:      SpO2: 100% 98% 98% 95%    Intake/Output Summary (Last 24 hours) at 01/29/14 0840 Last data filed at 01/29/14 0400  Gross per 24 hour  Intake   1540 ml  Output      0 ml  Net   1540 ml   Filed Weights   01/25/14 0100  Weight: 68.5 kg (151 lb 0.2 oz)    General appearance: alert, cooperative Resp: clear to auscultation bilaterally Cardio: regular rate and rhythm, S1, S2 normal, no murmur, click, rub or gallop GI: soft, non-tender; bowel sounds normal; no masses,  no organomegaly Extremities: extremities normal, atraumatic, no cyanosis or edema  Lab Results:  Basic Metabolic Panel:  Recent Labs Lab 01/25/14 0643 01/26/14 0340 01/27/14 0242 01/28/14 0608 01/29/14 0436  NA 140 142 139 139 139  K 4.1 3.7 3.5 3.3* 4.2  CL 112 115* 109 107 107  CO2 23 23 24 23 26   GLUCOSE 117* 118* 103* 113* 98  BUN 13 8 5* 8 5*  CREATININE 0.63 0.66 0.69 0.83 0.64  CALCIUM 7.8* 8.0* 8.6 9.0 8.7  MG 1.8  --   --   --   --   PHOS 3.2  --   --   --   --    Liver Function Tests:  Recent Labs Lab 01/25/14 0643  AST 16  ALT 12  ALKPHOS 45  BILITOT 1.0  PROT 4.9*  ALBUMIN 3.2*   CBC:  Recent Labs Lab 01/24/14 1837  01/26/14 2108 01/27/14 0242 01/27/14 1420 01/28/14 8119  01/29/14 0436  WBC 23.1*  < > 19.8* 21.6* 28.5* 32.1* 18.7*  NEUTROABS 10.1*  --   --   --   --   --  10.3*  HGB 10.7*  < > 9.1* 9.7* 10.6* 10.6* 9.4*  HCT 33.8*  < > 27.4* 29.5* 32.6* 33.0* 29.4*  MCV 80.3  < > 83.5 83.6 84.5 85.9 86.7  PLT 172  < > 140* 156 169 201 169  < > = values in this interval not displayed.  Recent Results (from the past 240 hour(s))  MRSA PCR Screening     Status: None   Collection Time: 01/25/14 12:59 AM  Result Value Ref Range Status   MRSA by PCR NEGATIVE NEGATIVE Final    Comment:        The GeneXpert  MRSA Assay (FDA approved for NASAL specimens only), is one component of a comprehensive MRSA colonization surveillance program. It is not intended to diagnose MRSA infection nor to guide or monitor treatment for MRSA infections.   Urine culture     Status: None   Collection Time: 01/25/14  4:40 AM  Result Value Ref Range Status   Specimen Description URINE, CLEAN CATCH  Final   Special Requests NONE  Final   Colony Count   Final    >=100,000 COLONIES/ML Performed at [facility]    Culture   Final    ESCHERICHIA COLI Performed at [facility]    Report Status 01/28/2014 FINAL  Final   Organism ID, Bacteria ESCHERICHIA COLI  Final      Susceptibility   Escherichia coli - MIC*    AMPICILLIN 4 SENSITIVE Sensitive     CEFAZOLIN <=4 SENSITIVE Sensitive     CEFTRIAXONE <=1 SENSITIVE Sensitive     CIPROFLOXACIN <=0.25 SENSITIVE Sensitive     GENTAMICIN <=1 SENSITIVE Sensitive     LEVOFLOXACIN <=0.12 SENSITIVE Sensitive     NITROFURANTOIN <=16 SENSITIVE Sensitive     TOBRAMYCIN <=1 SENSITIVE Sensitive     TRIMETH/SULFA <=20 SENSITIVE Sensitive     PIP/TAZO <=4 SENSITIVE Sensitive     * ESCHERICHIA COLI      Studies/Results: No results found.  Medications:  Scheduled: . antiseptic oral rinse  7 mL Mouth Rinse QID  . cephALEXin  500 mg Oral 3 times per day  . chlorhexidine  15 mL Mouth Rinse BID  . pantoprazole  40 mg Oral QHS  . polyethylene glycol  17 g Oral TID  . sodium chloride  3 mL Intravenous Q12H   Continuous: . sodium chloride 20 mL/hr (01/28/14 1140)   BPZ:WCHENIDPOEUMP **OR** acetaminophen, HYDROcodone-acetaminophen, ondansetron **OR** ondansetron (ZOFRAN) IV  Assessment/Plan:  Principal Problem:   Colonic hemorrhage Active Problems:   hx: breast cancer, DCIS, right, receptor +   Thickened endometrium   Acute blood loss anemia   Rectal bleeding    Hematochezia secondary to right colonic mass  Await pathology on the biopsy.  General surgery is following. She underwent a nuclear scan initially which did identify an area of concern in the mid ascending colon. Angiogram was done however, could not identify the bleeding vessel. Her bleeding subsequently subsided. She underwent EGD and colonoscopy as mentioned above. A colonic mass was found in the proximal colon. General surgery has been consulted.   Leukocytosis with eosinophilia  WBC remains elevated. He is no failure could be related to the abnormal findings noted on endoscopies. Continue to monitor.   Acute blood loss anemia  She has been transfused 3  units of blood but none in the last 2-3 days. Hemoglobin did drop some today. No need for transfusion. Monitor for now.  Thickened endometrium This was incidentally noted on the CT during this admission, as well as a previous CT scan done in September. Had a long discussion with the patient regarding the same. She denies any vaginal bleeding. Initially plan was for outpatient management, but considering a new finding of colonic mass. We will proceed with pelvic ultrasound, but she is here.   E Coli on Urine Culture Not truly symptomatic, but treating with Keflex for now.  DVT Prophylaxis: SCDs.   Code Status: Full code  Family Communication: Discussed with the patient along with her husband and 2 daughters Disposition Plan: Seen by PT. No need for home therapy. Await pathology. Will likely end up requiring surgery.    LOS: 5 days   [facility] Pager [phone] 01/29/2014, 8:40 AM  If 7PM-7AM, please contact night-coverage at [email or web address], password [facility]

## 2014-01-29 NOTE — Progress Notes (Signed)
1 Day Post-Op  Subjective: Pt doing well no complaints, lots of questions.  Plan surgery tomorrow right now she is on for 9:30 AM  Objective: Vital signs in last 24 hours: Temp:  [97.9 F (36.6 C)-98.1 F (36.7 C)] 98.1 F (36.7 C) (01/04 0513) Pulse Rate:  [84-93] 86 (01/04 0513) Resp:  [18] 18 (01/04 0513) BP: (104-112)/(35-43) 109/35 mmHg (01/04 0513) SpO2:  [95 %-98 %] 95 % (01/04 0513) Last BM Date: 01/29/14  Intake/Output from previous day: 01/03 0701 - 01/04 0700 In: 1540 [P.O.:840; I.V.:300; IV Piggyback:400] Out: -  Intake/Output this shift:    General appearance: alert, cooperative and no distress GI: soft, non-tender; bowel sounds normal; no masses,  no organomegaly  Lab Results:   Recent Labs  01/28/14 0608 01/29/14 0436  WBC 32.1* 18.7*  HGB 10.6* 9.4*  HCT 33.0* 29.4*  PLT 201 169    BMET  Recent Labs  01/28/14 0608 01/29/14 0436  NA 139 139  K 3.3* 4.2  CL 107 107  CO2 23 26  GLUCOSE 113* 98  BUN 8 5*  CREATININE 0.83 0.64  CALCIUM 9.0 8.7   PT/INR No results for input(s): LABPROT, INR in the last 72 hours.   Recent Labs Lab 01/25/14 0643  AST 16  ALT 12  ALKPHOS 45  BILITOT 1.0  PROT 4.9*  ALBUMIN 3.2*     Lipase     Component Value Date/Time   LIPASE 33 05/26/2009 0511     Studies/Results: No results found.  Medications: . antiseptic oral rinse  7 mL Mouth Rinse QID  . cephALEXin  500 mg Oral 3 times per day  . chlorhexidine  15 mL Mouth Rinse BID  . pantoprazole  40 mg Oral QHS  . polyethylene glycol  17 g Oral TID  . sodium chloride  3 mL Intravenous Q12H  PATHOLOGY 1. 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 NO VILLOUS ATROPHY, EOSINOPHILIA, HELICOBACTER PYLORI OR MALIGNANCY IDENTIFIED 2. Duodenum, Biopsy, r/o neoplasm FRAGMENTS OF DUODENAL ADENOMA. NO HIGH GRADE DYSPLASIA OR MALIGNANCY IDENTIFIED. 3. Stomach, biopsy, antrum r/o eosonophilic  gastritis BENIGN GASTRIC BODY TYPE MUCOSA WITH ASSOCIATED MILD CHRONIC INFLAMMTION AND REACTIVE CHANGES. NO EVIDENCE OF HELICOBACTER PYLORI, EOSINOPHILIC GASTRITIS, INTESTINAL METAPLASIA, DYSPLASIA OR MALIGNANCY. 4. Colon, biopsy, right ascending r/o neoplasm INVASIVE ADENOCARCINOMA   Assessment/Plan 1. Right colon mass, likely adenocarcinoma, with recent bleeding INVASIVE ADENOCARCINOMA 2. Duodenal polyp 3. Acute blood loss anemia 4. Leukocytosis with eosinophilia 5.  Hypertension 6.  Breast cancer  Right/DCIS/ER positive PR positive   Plan:  Partial colectomy tomorrow, Ultra sound pending and they are trying to get that done today.  i have answered all the questions and Dr. Hassell Done has spoken with her.  Plan surgery tomorrow.     LOS: 5 days    April [name] 01/29/2014

## 2014-01-30 ENCOUNTER — Encounter (HOSPITAL_COMMUNITY): Payer: Self-pay | Admitting: Anesthesiology

## 2014-01-30 ENCOUNTER — Encounter (HOSPITAL_COMMUNITY)
Admission: EM | Disposition: A | Discharge status: Skilled Nursing Facility | Payer: Self-pay | Source: Home / Self Care | Attending: Internal Medicine

## 2014-01-30 ENCOUNTER — Inpatient Hospital Stay (HOSPITAL_COMMUNITY): Payer: Medicare Other | Admitting: Certified Registered Nurse Anesthetist

## 2014-01-30 DIAGNOSIS — C189 Malignant neoplasm of colon, unspecified: Secondary | ICD-10-CM | POA: Diagnosis present

## 2014-01-30 DIAGNOSIS — Z9049 Acquired absence of other specified parts of digestive tract: Secondary | ICD-10-CM

## 2014-01-30 HISTORY — PX: LAPAROSCOPIC PARTIAL RIGHT COLECTOMY: SHX5913

## 2014-01-30 LAB — CBC
HCT: 31.4 % — ABNORMAL LOW (ref 36.0–46.0)
HCT: 25.6 % — ABNORMAL LOW (ref 36.0–46.0)
HCT: 21.7 % — ABNORMAL LOW (ref 36.0–46.0)
HEMOGLOBIN: 10.1 g/dL — AB (ref 12.0–15.0)
Hemoglobin: 8.1 g/dL — ABNORMAL LOW (ref 12.0–15.0)
Hemoglobin: 6.9 g/dL — CL (ref 12.0–15.0)
MCH: 27.4 pg (ref 26.0–34.0)
MCH: 27.6 pg (ref 26.0–34.0)
MCH: 27.5 pg (ref 26.0–34.0)
MCHC: 32.2 g/dL (ref 30.0–36.0)
MCHC: 31.6 g/dL (ref 30.0–36.0)
MCHC: 31.8 g/dL (ref 30.0–36.0)
MCV: 85.3 fL (ref 78.0–100.0)
MCV: 87.1 fL (ref 78.0–100.0)
MCV: 86.5 fL (ref 78.0–100.0)
PLATELETS: 188 10*3/uL (ref 150–400)
Platelets: 173 10*3/uL (ref 150–400)
Platelets: 184 10*3/uL (ref 150–400)
RBC: 3.68 MIL/uL — ABNORMAL LOW (ref 3.87–5.11)
RBC: 2.94 MIL/uL — ABNORMAL LOW (ref 3.87–5.11)
RBC: 2.51 MIL/uL — ABNORMAL LOW (ref 3.87–5.11)
RDW: 17.5 % — ABNORMAL HIGH (ref 11.5–15.5)
RDW: 17.6 % — ABNORMAL HIGH (ref 11.5–15.5)
RDW: 17.5 % — ABNORMAL HIGH (ref 11.5–15.5)
WBC: 22.3 10*3/uL — ABNORMAL HIGH (ref 4.0–10.5)
WBC: 22.6 10*3/uL — AB (ref 4.0–10.5)
WBC: 32.5 10*3/uL — AB (ref 4.0–10.5)

## 2014-01-30 LAB — BASIC METABOLIC PANEL
ANION GAP: 7 (ref 5–15)
BUN: 5 mg/dL — ABNORMAL LOW (ref 6–23)
CALCIUM: 8.8 mg/dL (ref 8.4–10.5)
CHLORIDE: 106 meq/L (ref 96–112)
CO2: 26 mmol/L (ref 19–32)
CREATININE: 0.77 mg/dL (ref 0.50–1.10)
GFR calc Af Amer: 86 mL/min — ABNORMAL LOW (ref >90)
GFR calc non Af Amer: 74 mL/min — ABNORMAL LOW (ref >90)
Glucose, Bld: 114 mg/dL — ABNORMAL HIGH (ref 70–99)
Potassium: 3.7 mmol/L (ref 3.5–5.1)
Sodium: 139 mmol/L (ref 135–145)

## 2014-01-30 LAB — PROTIME-INR
INR: 1.18 (ref 0.00–1.49)
PROTHROMBIN TIME: 15.2 s (ref 11.6–15.2)

## 2014-01-30 LAB — PREPARE RBC (CROSSMATCH)

## 2014-01-30 LAB — CREATININE, SERUM
Creatinine, Ser: 0.88 mg/dL (ref 0.50–1.10)
GFR calc Af Amer: 67 mL/min — ABNORMAL LOW (ref >90)
GFR calc non Af Amer: 58 mL/min — ABNORMAL LOW (ref >90)

## 2014-01-30 LAB — APTT: aPTT: 36 seconds (ref 24–37)

## 2014-01-30 SURGERY — LAPAROSCOPIC PARTIAL RIGHT COLECTOMY
Anesthesia: General | Laterality: Right

## 2014-01-30 MED ORDER — KCL IN DEXTROSE-NACL 20-5-0.45 MEQ/L-%-% IV SOLN
INTRAVENOUS | Status: DC
  Administered 2014-01-30 – 2014-02-05 (×11): via INTRAVENOUS
  Filled 2014-01-30 (×13): qty 1000

## 2014-01-30 MED ORDER — SODIUM CHLORIDE 0.9 % IV BOLUS (SEPSIS)
500.0000 mL | Freq: Once | INTRAVENOUS | Status: AC
  Administered 2014-01-30: 500 mL via INTRAVENOUS

## 2014-01-30 MED ORDER — 0.9 % SODIUM CHLORIDE (POUR BTL) OPTIME
TOPICAL | Status: DC | PRN
  Administered 2014-01-30: 2000 mL

## 2014-01-30 MED ORDER — HEPARIN SODIUM (PORCINE) 5000 UNIT/ML IJ SOLN
5000.0000 [IU] | Freq: Three times a day (TID) | INTRAMUSCULAR | Status: DC
  Filled 2014-01-30 (×5): qty 1

## 2014-01-30 MED ORDER — FENTANYL CITRATE 0.05 MG/ML IJ SOLN
INTRAMUSCULAR | Status: DC | PRN
  Administered 2014-01-30: 25 ug via INTRAVENOUS
  Administered 2014-01-30: 100 ug via INTRAVENOUS
  Administered 2014-01-30 (×2): 50 ug via INTRAVENOUS
  Administered 2014-01-30: 25 ug via INTRAVENOUS

## 2014-01-30 MED ORDER — GLYCOPYRROLATE 0.2 MG/ML IJ SOLN
INTRAMUSCULAR | Status: AC
  Filled 2014-01-30: qty 3

## 2014-01-30 MED ORDER — BUPIVACAINE LIPOSOME 1.3 % IJ SUSP
INTRAMUSCULAR | Status: DC | PRN
  Administered 2014-01-30: 20 mL

## 2014-01-30 MED ORDER — PHENYLEPHRINE HCL 10 MG/ML IJ SOLN
10.0000 mg | INTRAVENOUS | Status: DC | PRN
  Administered 2014-01-30: 50 ug/min via INTRAVENOUS

## 2014-01-30 MED ORDER — EPHEDRINE SULFATE 50 MG/ML IJ SOLN
INTRAMUSCULAR | Status: AC
  Filled 2014-01-30: qty 1

## 2014-01-30 MED ORDER — SODIUM CHLORIDE 0.9 % IJ SOLN
INTRAMUSCULAR | Status: AC
  Filled 2014-01-30: qty 10

## 2014-01-30 MED ORDER — SODIUM CHLORIDE 0.9 % IV SOLN
Freq: Once | INTRAVENOUS | Status: AC
  Administered 2014-01-30: 20:00:00 via INTRAVENOUS

## 2014-01-30 MED ORDER — MORPHINE SULFATE 10 MG/ML IJ SOLN: 1.0000 mg | INTRAMUSCULAR | Status: DC | PRN

## 2014-01-30 MED ORDER — BUPIVACAINE LIPOSOME 1.3 % IJ SUSP
20.0000 mL | Freq: Once | INTRAMUSCULAR | Status: DC
  Filled 2014-01-30: qty 20

## 2014-01-30 MED ORDER — PROPOFOL 10 MG/ML IV BOLUS
INTRAVENOUS | Status: DC | PRN
  Administered 2014-01-30: 120 mg via INTRAVENOUS

## 2014-01-30 MED ORDER — LORAZEPAM 2 MG/ML IJ SOLN: 0.2500 mg | Freq: Once | INTRAMUSCULAR | Status: AC

## 2014-01-30 MED ORDER — DEXTROSE 5 % IV SOLN
2.0000 g | INTRAVENOUS | Status: DC | PRN
  Administered 2014-01-30: 2 g via INTRAVENOUS

## 2014-01-30 MED ORDER — HYDROMORPHONE HCL 1 MG/ML IJ SOLN
INTRAMUSCULAR | Status: AC
  Filled 2014-01-30: qty 1

## 2014-01-30 MED ORDER — DEXTROSE 5 % IV SOLN
2.0000 g | Freq: Two times a day (BID) | INTRAVENOUS | Status: AC
  Administered 2014-01-30: 2 g via INTRAVENOUS
  Filled 2014-01-30: qty 2

## 2014-01-30 MED ORDER — ALUM & MAG HYDROXIDE-SIMETH 200-200-20 MG/5ML PO SUSP
30.0000 mL | Freq: Four times a day (QID) | ORAL | Status: DC | PRN
  Filled 2014-01-30: qty 30

## 2014-01-30 MED ORDER — ONDANSETRON HCL 4 MG PO TABS
4.0000 mg | ORAL_TABLET | Freq: Four times a day (QID) | ORAL | Status: DC | PRN
Start: 2014-01-30 — End: 2014-01-30
  Filled 2014-01-30: qty 1

## 2014-01-30 MED ORDER — SUCCINYLCHOLINE CHLORIDE 20 MG/ML IJ SOLN
INTRAMUSCULAR | Status: DC | PRN
  Administered 2014-01-30: 100 mg via INTRAVENOUS

## 2014-01-30 MED ORDER — NEOSTIGMINE METHYLSULFATE 10 MG/10ML IV SOLN
INTRAVENOUS | Status: AC
  Filled 2014-01-30: qty 1

## 2014-01-30 MED ORDER — LACTATED RINGERS IV SOLN
INTRAVENOUS | Status: DC
  Administered 2014-01-30: 1000 mL via INTRAVENOUS

## 2014-01-30 MED ORDER — ONDANSETRON HCL 4 MG/2ML IJ SOLN
4.0000 mg | Freq: Four times a day (QID) | INTRAMUSCULAR | Status: DC | PRN
  Administered 2014-02-02 – 2014-02-03 (×3): 4 mg via INTRAVENOUS
  Filled 2014-01-30 (×3): qty 2

## 2014-01-30 MED ORDER — ONDANSETRON HCL 4 MG/2ML IJ SOLN
INTRAMUSCULAR | Status: DC | PRN
  Administered 2014-01-30: 4 mg via INTRAVENOUS

## 2014-01-30 MED ORDER — LIDOCAINE HCL (CARDIAC) 20 MG/ML IV SOLN
INTRAVENOUS | Status: DC | PRN
  Administered 2014-01-30: 100 mg via INTRAVENOUS

## 2014-01-30 MED ORDER — PROMETHAZINE HCL 25 MG/ML IJ SOLN
INTRAMUSCULAR | Status: AC
  Filled 2014-01-30: qty 1

## 2014-01-30 MED ORDER — PROMETHAZINE HCL 25 MG/ML IJ SOLN: 6.2500 mg | INTRAMUSCULAR | Status: DC | PRN

## 2014-01-30 MED ORDER — ONDANSETRON HCL 4 MG/2ML IJ SOLN
INTRAMUSCULAR | Status: AC
  Filled 2014-01-30: qty 2

## 2014-01-30 MED ORDER — PROPOFOL 10 MG/ML IV BOLUS
INTRAVENOUS | Status: AC
  Filled 2014-01-30: qty 20

## 2014-01-30 MED ORDER — LACTATED RINGERS IR SOLN
Status: DC | PRN
  Administered 2014-01-30: 1000 mL
  Administered 2014-01-30: 1

## 2014-01-30 MED ORDER — DEXAMETHASONE SODIUM PHOSPHATE 10 MG/ML IJ SOLN
INTRAMUSCULAR | Status: AC
  Filled 2014-01-30: qty 1

## 2014-01-30 MED ORDER — SODIUM CHLORIDE 0.9 % IV SOLN
Freq: Once | INTRAVENOUS | Status: AC
  Administered 2014-01-31 (×2): via INTRAVENOUS

## 2014-01-30 MED ORDER — PHENYLEPHRINE HCL 10 MG/ML IJ SOLN
INTRAMUSCULAR | Status: AC
  Filled 2014-01-30: qty 1

## 2014-01-30 MED ORDER — LACTATED RINGERS IV SOLN
INTRAVENOUS | Status: DC
  Administered 2014-01-30: 1000 mL via INTRAVENOUS
  Administered 2014-01-30 (×2): via INTRAVENOUS

## 2014-01-30 MED ORDER — FENTANYL CITRATE 0.05 MG/ML IJ SOLN
INTRAMUSCULAR | Status: AC
  Filled 2014-01-30: qty 5

## 2014-01-30 MED ORDER — NEOSTIGMINE METHYLSULFATE 10 MG/10ML IV SOLN
INTRAVENOUS | Status: DC | PRN
  Administered 2014-01-30: 5 mg via INTRAVENOUS

## 2014-01-30 MED ORDER — HYDROMORPHONE HCL 1 MG/ML IJ SOLN
INTRAMUSCULAR | Status: DC | PRN
  Administered 2014-01-30: .2 mg via INTRAVENOUS
  Administered 2014-01-30: .4 mg via INTRAVENOUS
  Administered 2014-01-30 (×2): .2 mg via INTRAVENOUS

## 2014-01-30 MED ORDER — SODIUM CHLORIDE 0.9 % IJ SOLN
INTRAMUSCULAR | Status: DC | PRN
  Administered 2014-01-30: 10 mL via INTRAVENOUS

## 2014-01-30 MED ORDER — LORAZEPAM 2 MG/ML IJ SOLN
INTRAMUSCULAR | Status: AC
  Filled 2014-01-30: qty 1

## 2014-01-30 MED ORDER — SACCHAROMYCES BOULARDII 250 MG PO CAPS
250.0000 mg | ORAL_CAPSULE | Freq: Two times a day (BID) | ORAL | Status: DC
  Filled 2014-01-30 (×3): qty 1

## 2014-01-30 MED ORDER — GLYCOPYRROLATE 0.2 MG/ML IJ SOLN
INTRAMUSCULAR | Status: DC | PRN
  Administered 2014-01-30: 0.6 mg via INTRAVENOUS

## 2014-01-30 MED ORDER — ALVIMOPAN 12 MG PO CAPS
12.0000 mg | ORAL_CAPSULE | Freq: Two times a day (BID) | ORAL | Status: DC
  Filled 2014-01-30 (×2): qty 1

## 2014-01-30 MED ORDER — MORPHINE SULFATE 10 MG/ML IJ SOLN
1.0000 mg | INTRAMUSCULAR | Status: DC | PRN
  Filled 2014-01-30: qty 1

## 2014-01-30 MED ORDER — HYDROMORPHONE HCL 1 MG/ML IJ SOLN
0.2500 mg | INTRAMUSCULAR | Status: DC | PRN
  Administered 2014-01-30 (×2): 0.5 mg via INTRAVENOUS

## 2014-01-30 MED ORDER — MORPHINE SULFATE 2 MG/ML IJ SOLN
1.0000 mg | INTRAMUSCULAR | Status: DC | PRN
  Administered 2014-01-30 – 2014-01-31 (×4): 1 mg via INTRAVENOUS
  Filled 2014-01-30 (×4): qty 1

## 2014-01-30 MED ORDER — PROMETHAZINE HCL 25 MG/ML IJ SOLN
INTRAMUSCULAR | Status: DC | PRN
  Administered 2014-01-30: 6.25 mg via INTRAVENOUS

## 2014-01-30 MED ORDER — DEXAMETHASONE SODIUM PHOSPHATE 10 MG/ML IJ SOLN
INTRAMUSCULAR | Status: DC | PRN
  Administered 2014-01-30: 10 mg via INTRAVENOUS

## 2014-01-30 MED ORDER — CISATRACURIUM BESYLATE (PF) 10 MG/5ML IV SOLN
INTRAVENOUS | Status: DC | PRN
  Administered 2014-01-30: 8 mg via INTRAVENOUS
  Administered 2014-01-30: 2 mg via INTRAVENOUS

## 2014-01-30 MED ORDER — HYDROCODONE-ACETAMINOPHEN 5-325 MG PO TABS: 1.0000 | ORAL_TABLET | ORAL | Status: DC | PRN

## 2014-01-30 MED ORDER — LIDOCAINE HCL (CARDIAC) 20 MG/ML IV SOLN
INTRAVENOUS | Status: AC
  Filled 2014-01-30: qty 5

## 2014-01-30 MED ORDER — HYDROMORPHONE HCL 2 MG/ML IJ SOLN
INTRAMUSCULAR | Status: AC
  Filled 2014-01-30: qty 1

## 2014-01-30 SURGICAL SUPPLY — 79 items
APPLIER CLIP 5 13 M/L LIGAMAX5 (MISCELLANEOUS)
APPLIER CLIP ROT 10 11.4 M/L (STAPLE)
APR CLP MED LRG 11.4X10 (STAPLE)
APR CLP MED LRG 5 ANG JAW (MISCELLANEOUS)
BLADE EXTENDED COATED 6.5IN (ELECTRODE) IMPLANT
BLADE HEX COATED 2.75 (ELECTRODE) ×3 IMPLANT
CELLS DAT CNTRL 66122 CELL SVR (MISCELLANEOUS) ×1 IMPLANT
CHLORAPREP W/TINT 26ML (MISCELLANEOUS) ×3 IMPLANT
CLAMP ENDO BABCK 10MM (STAPLE) IMPLANT
CLIP APPLIE 5 13 M/L LIGAMAX5 (MISCELLANEOUS) IMPLANT
CLIP APPLIE ROT 10 11.4 M/L (STAPLE) IMPLANT
CLOSURE WOUND 1/2 X4 (GAUZE/BANDAGES/DRESSINGS)
CONNECTOR 5 IN 1 STRAIGHT STRL (MISCELLANEOUS) IMPLANT
COVER SURGICAL LIGHT HANDLE (MISCELLANEOUS) ×1 IMPLANT
DECANTER SPIKE VIAL GLASS SM (MISCELLANEOUS) ×3 IMPLANT
DEVICE TROCAR PUNCTURE CLOSURE (ENDOMECHANICALS) IMPLANT
DRAPE LAPAROSCOPIC ABDOMINAL (DRAPES) ×4 IMPLANT
DRSG OPSITE POSTOP 4X8 (GAUZE/BANDAGES/DRESSINGS) ×2 IMPLANT
ELECT REM PT RETURN 9FT ADLT (ELECTROSURGICAL) ×3
ELECTRODE REM PT RTRN 9FT ADLT (ELECTROSURGICAL) ×1 IMPLANT
GAUZE SPONGE 2X2 8PLY STRL LF (GAUZE/BANDAGES/DRESSINGS) IMPLANT
GAUZE SPONGE 4X4 12PLY STRL (GAUZE/BANDAGES/DRESSINGS) ×1 IMPLANT
GLOVE BIOGEL M 8.0 STRL (GLOVE) ×6 IMPLANT
GLOVE BIOGEL PI IND STRL 7.0 (GLOVE) ×1 IMPLANT
GLOVE BIOGEL PI INDICATOR 7.0 (GLOVE) ×8
GOWN SPEC L4 XLG W/TWL (GOWN DISPOSABLE) ×1 IMPLANT
GOWN STRL REUS W/TWL LRG LVL3 (GOWN DISPOSABLE) ×1 IMPLANT
GOWN STRL REUS W/TWL XL LVL3 (GOWN DISPOSABLE) ×19 IMPLANT
KIT ID BAND (KITS) ×2 IMPLANT
LEGGING LITHOTOMY PAIR STRL (DRAPES) IMPLANT
LIGASURE IMPACT 36 18CM CVD LR (INSTRUMENTS) IMPLANT
NS IRRIG 1000ML POUR BTL (IV SOLUTION) ×6 IMPLANT
PACK COLON (CUSTOM PROCEDURE TRAY) ×3 IMPLANT
RELOAD PROXIMATE 75MM BLUE (ENDOMECHANICALS) ×6 IMPLANT
RELOAD STAPLE 75 3.8 BLU REG (ENDOMECHANICALS) IMPLANT
RETRACTOR WND ALEXIS 18 MED (MISCELLANEOUS) IMPLANT
RTRCTR WOUND ALEXIS 18CM MED (MISCELLANEOUS) ×3
SCISSORS LAP 5X35 DISP (ENDOMECHANICALS) ×2 IMPLANT
SCRUB PCMX 4 OZ (MISCELLANEOUS) ×1 IMPLANT
SEALER TISSUE G2 CVD JAW 35 (ENDOMECHANICALS) IMPLANT
SEALER TISSUE G2 CVD JAW 45CM (ENDOMECHANICALS)
SET IRRIG TUBING LAPAROSCOPIC (IRRIGATION / IRRIGATOR) ×2 IMPLANT
SHEARS HARMONIC ACE PLUS 36CM (ENDOMECHANICALS) ×3 IMPLANT
SHEET LAVH (DRAPES) IMPLANT
SLEEVE Z-THREAD 5X100MM (TROCAR) IMPLANT
SOLUTION ANTI FOG 6CC (MISCELLANEOUS) ×1 IMPLANT
SPONGE GAUZE 2X2 STER 10/PKG (GAUZE/BANDAGES/DRESSINGS) ×2
STAPLER PROXIMATE 75MM BLUE (STAPLE) ×2 IMPLANT
STAPLER VISISTAT 35W (STAPLE) ×3 IMPLANT
STRIP CLOSURE SKIN 1/2X4 (GAUZE/BANDAGES/DRESSINGS) IMPLANT
SUT PDS AB 1 CT1 27 (SUTURE) IMPLANT
SUT PDS AB 1 CTX 36 (SUTURE) IMPLANT
SUT PDS AB 4-0 SH 27 (SUTURE) ×4 IMPLANT
SUT PROLENE 2 0 KS (SUTURE) IMPLANT
SUT SILK 2 0 (SUTURE) ×3
SUT SILK 2 0 SH CR/8 (SUTURE) ×3 IMPLANT
SUT SILK 2-0 18XBRD TIE 12 (SUTURE) ×1 IMPLANT
SUT SILK 3 0 (SUTURE) ×3
SUT SILK 3 0 SH CR/8 (SUTURE) ×5 IMPLANT
SUT SILK 3-0 18XBRD TIE 12 (SUTURE) ×1 IMPLANT
SUT VIC AB 2-0 CT1 27 (SUTURE)
SUT VIC AB 2-0 CT1 27XBRD (SUTURE) IMPLANT
SUT VIC AB 2-0 SH 18 (SUTURE) ×2 IMPLANT
SUT VIC AB 3-0 PS2 18 (SUTURE)
SUT VIC AB 3-0 PS2 18XBRD (SUTURE) IMPLANT
SUT VIC AB 4-0 SH 18 (SUTURE) ×2 IMPLANT
SUT VICRYL 0 UR6 27IN ABS (SUTURE) IMPLANT
SYR 30ML LL (SYRINGE) ×3 IMPLANT
SYS LAPSCP GELPORT 120MM (MISCELLANEOUS)
SYSTEM LAPSCP GELPORT 120MM (MISCELLANEOUS) IMPLANT
TRAY FOLEY CATH 14FRSI W/METER (CATHETERS) ×3 IMPLANT
TROCAR BLADELESS OPT 5 100 (ENDOMECHANICALS) ×3 IMPLANT
TROCAR BLADELESS OPT 5 75 (ENDOMECHANICALS) ×2 IMPLANT
TROCAR XCEL NON-BLD 11X100MML (ENDOMECHANICALS) IMPLANT
TROCAR XCEL UNIV SLVE 11M 100M (ENDOMECHANICALS) ×1 IMPLANT
TUBING CONNECTING 10 (TUBING) IMPLANT
TUBING CONNECTING 10' (TUBING)
TUBING FILTER THERMOFLATOR (ELECTROSURGICAL) ×3 IMPLANT
YANKAUER SUCT BULB TIP NO VENT (SUCTIONS) ×3 IMPLANT

## 2014-01-30 NOTE — Transfer of Care (Signed)
Immediate Anesthesia Transfer of Care Note  Patient: April [name]  Procedure(s) Performed: Procedure(s) (LRB): LAPAROSCOPIC PARTIAL RIGHT COLECTOMY (Right)  Patient Location: PACU  Anesthesia Type: General  Level of Consciousness: sedated, patient cooperative and responds to stimulation  Airway & Oxygen Therapy: Patient Spontanous Breathing and Patient connected to face mask oxgen  Post-op Assessment: Report given to PACU RN and Post -op Vital signs reviewed and stable  Post vital signs: Reviewed and stable  Complications: No apparent anesthesia complications

## 2014-01-30 NOTE — Progress Notes (Addendum)
[facility] PROGRESS NOTE  April [name] KGM:[id number] DOB: 1925/02/27 DOA: 01/24/2014  PCP: Tommy Medal, MD  Brief HPI: 81 year old Caucasian female presented with rectal bleeding. She was transfused PRBCs. CT scan suggested bleeding from angiodysplasia. She was admitted for further management. She she underwent colonoscopy and EGD. Colonic mass was noted along with some abnormal duodenal findings. Biopsies have been obtained. General surgery has been consulted. Plan is for surgery 1/5.  Past medical history:  Past Medical History  Diagnosis Date  . Hypertension   . History of breast cancer 2011    DCIS, Right breast. No radiation, took Aromasen 2012  . Senile osteoporosis     Took Fosamax x15years  . Cancer     Consultants: Gastroenterology, interventional radiology, general surgery  Procedures:  Visceral angiogram on December 31 IMPRESSION: 1. Mesenteric arteriography is a negative for evidence of active bleeding or focal vascular abnormality that would suggest a source for bleeding. 2. 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 3. 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  EGD 1/3 ENDOSCOPIC IMPRESSION: Plaque-like verrucal flat mass versus inflammation in the distal bulb and proximal second portion of the duodenum, could be inflammatory or neoplastic, suspicious for adenoma.Mild gastritis.  Colonoscopy 1/3 Proximal ascending colon mass across from the ileocecal valve highly suspicious for carcinoma or possibly lymphoma  Pathology report Diagnosis 1. 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 NO VILLOUS ATROPHY, EOSINOPHILIA, HELICOBACTER PYLORI OR MALIGNANCY IDENTIFIED 2. Duodenum, Biopsy, r/o neoplasm FRAGMENTS OF  DUODENAL ADENOMA. NO HIGH GRADE DYSPLASIA OR MALIGNANCY IDENTIFIED. 3. Stomach, biopsy, antrum r/o eosonophilic gastritis BENIGN GASTRIC BODY TYPE MUCOSA WITH ASSOCIATED MILD CHRONIC INFLAMMTION AND REACTIVE CHANGES. NO EVIDENCE OF HELICOBACTER PYLORI, EOSINOPHILIC GASTRITIS, INTESTINAL METAPLASIA, DYSPLASIA OR MALIGNANCY. 4. Colon, biopsy, right ascending r/o neoplasm INVASIVE ADENOCARCINOMA.  Antibiotics: None  Subjective: Patient complains of some nausea this morning. No vomiting. Denies any bleeding per rectally.  Objective: Vital Signs  Filed Vitals:   01/29/14 0513 01/29/14 1444 01/29/14 2058 01/30/14 0440  BP: 109/35 118/51 106/49 120/42  Pulse: 86 92 94 86  Temp: 98.1 F (36.7 C) 97.5 F (36.4 C) 98 F (36.7 C) 98 F (36.7 C)  TempSrc: Oral Oral Oral Oral  Resp: 18 18 18 18   Height:      Weight:      SpO2: 95% 100% 100% 98%    Intake/Output Summary (Last 24 hours) at 01/30/14 0804 Last data filed at 01/30/14 0441  Gross per 24 hour  Intake      0 ml  Output      0 ml  Net      0 ml   Filed Weights   01/25/14 0100  Weight: 68.5 kg (151 lb 0.2 oz)    General appearance: alert, cooperative Resp: clear to auscultation bilaterally Cardio: regular rate and rhythm, S1, S2 normal, no murmur, click, rub or gallop GI: soft, non-tender; bowel sounds normal; no masses,  no organomegaly Extremities: extremities normal, atraumatic, no cyanosis or edema  Lab Results:  Basic Metabolic Panel:  Recent Labs Lab 01/25/14 0643 01/26/14 0340 01/27/14 0242 01/28/14 0608 01/29/14 0436 01/30/14 0455  NA 140 142 139 139 139 139  K 4.1 3.7 3.5 3.3* 4.2 3.7  CL 112 115* 109 107 107 106  CO2 23 23 24 23 26 26   GLUCOSE 117* 118*  103* 113* 98 114*  BUN 13 8 5* 8 5* 5*  CREATININE 0.63 0.66 0.69 0.83 0.64 0.77  CALCIUM 7.8* 8.0* 8.6 9.0 8.7 8.8  MG 1.8  --   --   --   --   --   PHOS 3.2  --   --   --   --   --    Liver Function Tests:  Recent Labs Lab 01/25/14 0643   AST 16  ALT 12  ALKPHOS 45  BILITOT 1.0  PROT 4.9*  ALBUMIN 3.2*   CBC:  Recent Labs Lab 01/24/14 1837  01/26/14 2108 01/27/14 0242 01/27/14 1420 01/28/14 0608 01/29/14 0436  WBC 23.1*  < > 19.8* 21.6* 28.5* 32.1* 18.7*  NEUTROABS 10.1*  --   --   --   --   --  10.3*  HGB 10.7*  < > 9.1* 9.7* 10.6* 10.6* 9.4*  HCT 33.8*  < > 27.4* 29.5* 32.6* 33.0* 29.4*  MCV 80.3  < > 83.5 83.6 84.5 85.9 86.7  PLT 172  < > 140* 156 169 201 169  < > = values in this interval not displayed.  Recent Results (from the past 240 hour(s))  MRSA PCR Screening     Status: None   Collection Time: 01/25/14 12:59 AM  Result Value Ref Range Status   MRSA by PCR NEGATIVE NEGATIVE Final    Comment:        The GeneXpert MRSA Assay (FDA approved for NASAL specimens only), is one component of a comprehensive MRSA colonization surveillance program. It is not intended to diagnose MRSA infection nor to guide or monitor treatment for MRSA infections.   Urine culture     Status: None   Collection Time: 01/25/14  4:40 AM  Result Value Ref Range Status   Specimen Description URINE, CLEAN CATCH  Final   Special Requests NONE  Final   Colony Count   Final    >=100,000 COLONIES/ML Performed at [facility]    Culture   Final    ESCHERICHIA COLI Performed at [facility]    Report Status 01/28/2014 FINAL  Final   Organism ID, Bacteria ESCHERICHIA COLI  Final      Susceptibility   Escherichia coli - MIC*    AMPICILLIN 4 SENSITIVE Sensitive     CEFAZOLIN <=4 SENSITIVE Sensitive     CEFTRIAXONE <=1 SENSITIVE Sensitive     CIPROFLOXACIN <=0.25 SENSITIVE Sensitive     GENTAMICIN <=1 SENSITIVE Sensitive     LEVOFLOXACIN <=0.12 SENSITIVE Sensitive     NITROFURANTOIN <=16 SENSITIVE Sensitive     TOBRAMYCIN <=1 SENSITIVE Sensitive     TRIMETH/SULFA <=20 SENSITIVE Sensitive     PIP/TAZO <=4 SENSITIVE Sensitive     * ESCHERICHIA COLI  Surgical pcr screen     Status: Abnormal    Collection Time: 01/29/14  2:52 PM  Result Value Ref Range Status   MRSA, PCR NEGATIVE NEGATIVE Final   Staphylococcus aureus POSITIVE (A) NEGATIVE Final    Comment:        The Xpert SA Assay (FDA approved for NASAL specimens in patients over 12 years of age), is one component of a comprehensive surveillance program.  Test performance has been validated by [employer] for patients greater than or equal to 41 year old. It is not intended to diagnose infection nor to guide or monitor treatment.       Studies/Results: US Transvaginal Non-ob  01/29/2014   CLINICAL DATA:  Postmenopausal  endometrial thickening seen on recent CT.  EXAM: TRANSABDOMINAL AND TRANSVAGINAL ULTRASOUND OF PELVIS  TECHNIQUE: Both transabdominal and transvaginal ultrasound examinations of the pelvis were performed. Transabdominal technique was performed for global imaging of the pelvis including uterus, ovaries, adnexal regions, and pelvic cul-de-sac. It was necessary to proceed with endovaginal exam following the transabdominal exam to visualize the endometrium and ovaries.  COMPARISON:  CT on 01/24/2014  FINDINGS: Uterus  Measurements: 6.4 x 3.0 x 4.3 cm. No myometrial masses identified. No definite cervical mass visualized.  Endometrium  Thickness: The endometrial cavity is diffusely distended with complex hypoechoic fluid, consistent with hydrometros. Endometrial thickness cannot be measured, however no focal endometrial mass visualized.  Right ovary  Measurements: Not directly visualized by transabdominal or transvaginal sonography, however no adnexal mass identified.  Left ovary  Measurements: Not directly visualized by transabdominal or transvaginal sonography, however no adnexal mass identified.  Other findings  No free fluid.  IMPRESSION: Distended fluid-filled endometrial cavity, consistent with hydrometros. No mass or other etiology apparent by ultrasound. Consider GYN consultation; pelvic MRI without and with  contrast may also be helpful for further evaluation if patient can cooperate with breath holding instructions.  Nonvisualization of ovaries, however no adnexal mass identified.   Electronically Signed   By: Earle Gell M.D.   On: 01/29/2014 15:10   US Pelvis Complete  01/29/2014   CLINICAL DATA:  Postmenopausal endometrial thickening seen on recent CT.  EXAM: TRANSABDOMINAL AND TRANSVAGINAL ULTRASOUND OF PELVIS  TECHNIQUE: Both transabdominal and transvaginal ultrasound examinations of the pelvis were performed. Transabdominal technique was performed for global imaging of the pelvis including uterus, ovaries, adnexal regions, and pelvic cul-de-sac. It was necessary to proceed with endovaginal exam following the transabdominal exam to visualize the endometrium and ovaries.  COMPARISON:  CT on 01/24/2014  FINDINGS: Uterus  Measurements: 6.4 x 3.0 x 4.3 cm. No myometrial masses identified. No definite cervical mass visualized.  Endometrium  Thickness: The endometrial cavity is diffusely distended with complex hypoechoic fluid, consistent with hydrometros. Endometrial thickness cannot be measured, however no focal endometrial mass visualized.  Right ovary  Measurements: Not directly visualized by transabdominal or transvaginal sonography, however no adnexal mass identified.  Left ovary  Measurements: Not directly visualized by transabdominal or transvaginal sonography, however no adnexal mass identified.  Other findings  No free fluid.  IMPRESSION: Distended fluid-filled endometrial cavity, consistent with hydrometros. No mass or other etiology apparent by ultrasound. Consider GYN consultation; pelvic MRI without and with contrast may also be helpful for further evaluation if patient can cooperate with breath holding instructions.  Nonvisualization of ovaries, however no adnexal mass identified.   Electronically Signed   By: Earle Gell M.D.   On: 01/29/2014 15:10    Medications:  Scheduled: . antiseptic oral  rinse  7 mL Mouth Rinse QID  . bupivacaine liposome  20 mL Infiltration Once  . cefoTEtan (CEFOTAN) 2 GM IVPB  2 g Intravenous On Call to OR  . chlorhexidine  15 mL Mouth Rinse BID  . pantoprazole  40 mg Oral QHS  . polyethylene glycol  17 g Oral TID  . sodium chloride  3 mL Intravenous Q12H   Continuous: . sodium chloride 20 mL/hr (01/28/14 1140)   ZOX:[id number] **OR** acetaminophen, HYDROcodone-acetaminophen, ondansetron **OR** ondansetron (ZOFRAN) IV  Assessment/Plan:  Principal Problem:   Colonic hemorrhage Active Problems:   hx: breast cancer, DCIS, right, receptor +   Thickened endometrium   Acute blood loss anemia   Rectal bleeding   Colonic  mass    Hematochezia secondary to cancer of the ascending colon Pathology shows adenocarcinoma of the colon. Biopsies from upper endoscopy shows duodenitis. No malignancy noted. Rest of the report as above. Plan is for resection of the colonic mass today. General surgery is following. Patient presented with hematochezia. She underwent a nuclear scan initially which did identify an area of concern in the mid ascending colon. Angiogram was done however, could not identify the bleeding vessel. Her bleeding subsequently subsided. She underwent EGD and colonoscopy as mentioned above. A colonic mass was found in the proximal colon.  Leukocytosis with eosinophilia  WBC remains elevated. Continue to monitor. Probably due to the cancer  Acute blood loss anemia  She has been transfused 3 units of blood but none in the last 2-3 days. Hemoglobin remains stable.   Thickened endometrium/fluid-filled uterus This was incidentally noted on the CT during this admission, as well as a previous CT scan done in September. Had a long discussion with the patient regarding the same. She denies any vaginal bleeding. Initially plan was for outpatient management, but considering a new finding of colonic mass. An ultrasound was done yesterday. It showed  fluid-filled uterus. Discussed with GYN yesterday. They recommend outpatient management for the same. No need for urgent intervention..  E Coli on Urine Culture Not truly symptomatic, but was treated with Keflex. Keflex was stopped due to bowel prep for GI surgery. Can likely observe off of antibiotics for now.  ADDENDUM Called by Rn that patient was passing blood per rectum. BP dropped to 17'H systolic. RN asked to notify surgeon as patient underwent hemicolectomy earlier today. After fluid bolus BP upto 15'A systolic. Repeat another bolus. Patient will be moved to stepdown. Stat CBC. She will likely need transfusion. Unclear what is causing this. ?Operative complication. Surgeon has given orders. Patient examined at bedside. Abdomen slightly distended but non tender. No bleeding from incision site. Will be monitored closely. Linzy Laury 6:40 PM  DVT Prophylaxis: SCDs.   Code Status: Full code  Family Communication: Discussed with the patient Disposition Plan: Plan is for surgery today.     LOS: 6 days   [facility] Pager [phone] 01/30/2014, 8:04 AM  If 7PM-7AM, please contact night-coverage at [email or web address], password [facility]

## 2014-01-30 NOTE — Progress Notes (Signed)
O2 mask changed back to O2 cannula; SAO2 99

## 2014-01-30 NOTE — Progress Notes (Signed)
Patient had watery bowel movement x 1 in bedside commode, stool green in color. No frank blood or black or tarry stool noted.

## 2014-01-30 NOTE — Anesthesia Postprocedure Evaluation (Signed)
  Anesthesia Post-op Note  Patient: April [name]  Procedure(s) Performed: Procedure(s) (LRB): LAPAROSCOPIC PARTIAL RIGHT COLECTOMY (Right)  Patient Location: PACU  Anesthesia Type: General  Level of Consciousness: awake and alert   Airway and Oxygen Therapy: Patient Spontanous Breathing  Post-op Pain: mild  Post-op Assessment: Post-op Vital signs reviewed, Patient's Cardiovascular Status Stable, Respiratory Function Stable, Patent Airway and No signs of Nausea or vomiting  Last Vitals:  Filed Vitals:   01/30/14 1300  BP:   Pulse:   Temp: 36.6 C  Resp:     Post-op Vital Signs: stable   Complications: No apparent anesthesia complications

## 2014-01-30 NOTE — H&P (View-Only) (Signed)
General Surgery [facility]  Reason for Consult: ascending colon mass (likely carcinoma), duodenal polyp  Referring Physician: Dr. Teena Irani, [facility]  April [name] is an 81 y.o. female.   HPI: patient is an 81 yo WF known to my practice from lap chole in 2011 and breast surgery in 2011.  Patient presented 12/30 with painless bleeding per rectum.  Has received 3U PRBC's during hospitalization.  Nuclear scan positive for bleeding, but angio unable to localize.  Colonoscopy this AM shows mass in ascending colon consistent with carcinoma.  Patient also underwent EGD showing an abnormality in the duodenum.  Biopsies of the colon and duodenum were submitted to pathology.  Patient has significant leukocytosis with eosinophilia.  Has been evaluated by allergist and hematologist.  General surgery consulted for probable right colectomy during this admission.  Past Medical History  Diagnosis Date  . Hypertension   . History of breast cancer 2011    DCIS, Right breast. No radiation, took Aromasen 2012  . Senile osteoporosis     Took Fosamax x15years  . Cancer     Past Surgical History  Procedure Laterality Date  . Laparoscopic cholecystectomy  05/26/2009  . Breast lumpectomy  12/24/2009    Right, Dr Margot Chimes  . Cataract extraction w/ intraocular lens  implant, bilateral Bilateral 3212,2482  . Tonsillectomy and adenoidectomy      Family History  Problem Relation Age of Onset  . Heart disease Mother   . Heart disease Father   . Diabetes Father   . Cancer Paternal Aunt     breast    Social History:  reports that she has never smoked. She has never used smokeless tobacco. She reports that she drinks alcohol. She reports that she does not use illicit drugs.  Allergies:  Allergies  Allergen Reactions  . Codeine   . Sulfa Antibiotics     Medications: I have reviewed the patient's current medications.  Results for orders placed or performed during  the hospital encounter of 01/24/14 (from the past 48 hour(s))  CBC     Status: Abnormal   Collection Time: 01/26/14  9:08 PM  Result Value Ref Range   WBC 19.8 (H) 4.0 - 10.5 K/uL   RBC 3.28 (L) 3.87 - 5.11 MIL/uL   Hemoglobin 9.1 (L) 12.0 - 15.0 g/dL   HCT 27.4 (L) 36.0 - 46.0 %   MCV 83.5 78.0 - 100.0 fL   MCH 27.7 26.0 - 34.0 pg   MCHC 33.2 30.0 - 36.0 g/dL   RDW 16.1 (H) 11.5 - 15.5 %   Platelets 140 (L) 150 - 400 K/uL  CBC     Status: Abnormal   Collection Time: 01/27/14  2:42 AM  Result Value Ref Range   WBC 21.6 (H) 4.0 - 10.5 K/uL   RBC 3.53 (L) 3.87 - 5.11 MIL/uL   Hemoglobin 9.7 (L) 12.0 - 15.0 g/dL   HCT 29.5 (L) 36.0 - 46.0 %   MCV 83.6 78.0 - 100.0 fL   MCH 27.5 26.0 - 34.0 pg   MCHC 32.9 30.0 - 36.0 g/dL   RDW 16.3 (H) 11.5 - 15.5 %   Platelets 156 150 - 400 K/uL  Basic metabolic panel     Status: Abnormal   Collection Time: 01/27/14  2:42 AM  Result Value Ref Range   Sodium 139 135 - 145 mmol/L    Comment: Please note change in reference range.   Potassium 3.5 3.5 - 5.1 mmol/L  Comment: Please note change in reference range.   Chloride 109 96 - 112 mEq/L   CO2 24 19 - 32 mmol/L   Glucose, Bld 103 (H) 70 - 99 mg/dL   BUN 5 (L) 6 - 23 mg/dL   Creatinine, Ser 0.69 0.50 - 1.10 mg/dL   Calcium 8.6 8.4 - 10.5 mg/dL   GFR calc non Af Amer 77 (L) >90 mL/min   GFR calc Af Amer 89 (L) >90 mL/min    Comment: (NOTE) The eGFR has been calculated using the CKD EPI equation. This calculation has not been validated in all clinical situations. eGFR's persistently <90 mL/min signify possible Chronic Kidney Disease.    Anion gap 6 5 - 15  CBC     Status: Abnormal   Collection Time: 01/27/14  2:20 PM  Result Value Ref Range   WBC 28.5 (H) 4.0 - 10.5 K/uL   RBC 3.86 (L) 3.87 - 5.11 MIL/uL   Hemoglobin 10.6 (L) 12.0 - 15.0 g/dL   HCT 32.6 (L) 36.0 - 46.0 %   MCV 84.5 78.0 - 100.0 fL   MCH 27.5 26.0 - 34.0 pg   MCHC 32.5 30.0 - 36.0 g/dL   RDW 16.6 (H) 11.5 - 15.5 %    Platelets 169 150 - 400 K/uL  CBC     Status: Abnormal   Collection Time: 01/28/14  6:08 AM  Result Value Ref Range   WBC 32.1 (H) 4.0 - 10.5 K/uL    Comment: REPEATED TO VERIFY CONSISTENT WITH PREVIOUS RESULT    RBC 3.84 (L) 3.87 - 5.11 MIL/uL   Hemoglobin 10.6 (L) 12.0 - 15.0 g/dL   HCT 33.0 (L) 36.0 - 46.0 %   MCV 85.9 78.0 - 100.0 fL   MCH 27.6 26.0 - 34.0 pg   MCHC 32.1 30.0 - 36.0 g/dL   RDW 17.2 (H) 11.5 - 15.5 %   Platelets 201 150 - 400 K/uL  Basic metabolic panel     Status: Abnormal   Collection Time: 01/28/14  6:08 AM  Result Value Ref Range   Sodium 139 135 - 145 mmol/L    Comment: Please note change in reference range.   Potassium 3.3 (L) 3.5 - 5.1 mmol/L    Comment: Please note change in reference range.   Chloride 107 96 - 112 mEq/L   CO2 23 19 - 32 mmol/L   Glucose, Bld 113 (H) 70 - 99 mg/dL   BUN 8 6 - 23 mg/dL   Creatinine, Ser 0.83 0.50 - 1.10 mg/dL   Calcium 9.0 8.4 - 10.5 mg/dL   GFR calc non Af Amer 62 (L) >90 mL/min   GFR calc Af Amer 72 (L) >90 mL/min    Comment: (NOTE) The eGFR has been calculated using the CKD EPI equation. This calculation has not been validated in all clinical situations. eGFR's persistently <90 mL/min signify possible Chronic Kidney Disease.    Anion gap 9 5 - 15    No results found.  Review of Systems  Constitutional: Positive for malaise/fatigue. Negative for fever, chills, weight loss and diaphoresis.  HENT: Negative.   Eyes: Negative.   Respiratory: Negative.   Cardiovascular: Negative.   Gastrointestinal: Positive for diarrhea and blood in stool. Negative for heartburn, nausea, vomiting, abdominal pain and constipation.  Genitourinary: Negative.   Musculoskeletal: Negative.   Skin: Negative.   Neurological: Positive for weakness.  Endo/Heme/Allergies: Negative.        Eosinophilia  Psychiatric/Behavioral:  Irritablility   Blood pressure 104/42, pulse 93, temperature 97.9 F (36.6 C), temperature  source Oral, resp. rate 18, height _0  (1.651 m), weight 151 lb 0.2 oz (68.5 kg), SpO2 98 %. Physical Exam  Constitutional: She is oriented to person, place, and time. She appears well-developed and well-nourished. No distress.  HENT:  Head: Normocephalic and atraumatic.  Right Ear: External ear normal.  Left Ear: External ear normal.  Eyes: Conjunctivae are normal. Pupils are equal, round, and reactive to light. No scleral icterus.  Neck: Normal range of motion. Neck supple. No tracheal deviation present. No thyromegaly present.  Cardiovascular: Normal rate, regular rhythm and normal heart sounds.   No murmur heard. Respiratory: Effort normal and breath sounds normal. She has no wheezes.  GI: Soft. Bowel sounds are normal. She exhibits no distension and no mass. There is no tenderness. There is no rebound and no guarding.  Well healed surgical incisions, no hernia  Musculoskeletal: Normal range of motion. She exhibits no edema or tenderness.  Neurological: She is alert and oriented to person, place, and time.  Skin: Skin is warm and dry. She is not diaphoretic.  Psychiatric: She has a normal mood and affect. Her behavior is normal.    Assessment/Plan: 1.  Right colon mass, likely adenocarcinoma, with recent bleeding  Await biopsy results on colon and duodenum  Maintain bowel prep with clear liquid diet  Will need oral abx prep pre-op  Plan right colectomy - will discuss with Dr. Kaylyn Lim who is our [place] [facility] surgeon this week - I discussed with family at bedside 2.  Duodenal polyp  Await path results prior to laparotomy 3.  Acute blood loss anemia  Hgb 10.6 - continue to monitor  Limit blood draws for labs 4.  Leukocytosis with eosinophilia  Unknown etiology  Earnstine Regal, MD, [facility] Office: [place] 01/28/2014, 5:27 PM

## 2014-01-30 NOTE — Progress Notes (Signed)
Received report from attending that pt was just transferred to SDU secondary to postop bleeding. Pt had a hemi colectomy today for colon cancer. She came in days ago with a GIB and dx was made here. She is bleeding a large amount of bright red blood with some clots per rectum. At present, she has had 1.5 L bolus and is to have 2L. Also, to receive 3 U PRBCs with H/H after. Dr. Lucia Gaskins of [place] at bedside talking to pt and daughter. ' BP up to 825 systolic while boluses infusing. Slightly tachy at 100-110. No arrythmia noted on tele. Pt is alert, anxious, and pale. Awaiting surgical plan.  Will follow closely. Clance Boll, NP [facility]

## 2014-01-30 NOTE — Progress Notes (Signed)
General Surgery Note  LOS: 6 days  POD -  Day of Surgery  Assessment/Plan: 1.  LAPAROSCOPIC PARTIAL RIGHT COLECTOMY - 01/30/2014 - M. Martin  2.  Post op GI bleed  Hgb - 6.9  (Hgb immediately post op - 8.1)  Probably at staple line (all preop assessment pointed to right colon tumor as site of pre op GI bleed)  Coags from this AM were okay.  Platelets low normal.  Plan:  Transferred to ICU, transfuse 3 units PRBC, will give FFP, repeat labs and repeat PE  Daughter, [name], at bedside.  Husband on way back to hospital.  Son and another daughter have been around today.  3.  DVT prophylaxis - on hold for now 4.  HTN 5.  History of duodenal polyp 6.  Eosinophilia - unexplained   Principal Problem:   Colon cancer Active Problems:   hx: breast cancer, DCIS, right, receptor +   Colonic hemorrhage   Thickened endometrium   Acute blood loss anemia   Rectal bleeding   Colonic mass   Lap assisted right hemicolectomy Jan 2016  Subjective:  Alert.  Has uncomfortable abdominal pain mid abdomen, but she is immediately post op, so I do not know what that means.  Daughter, [name],  at bedside.  She lives at [employer] Objective:   Filed Vitals:   01/30/14 1930  BP:   Pulse: 116  Temp:   Resp:      Intake/Output from previous day:     Intake/Output this shift:      Physical Exam:   General: WN pale older WF who is alert and oriented.    HEENT: Normal. Pupils equal. .   Lungs: Clear.   Cardiac:  Tachycardic.   Abdomen: Distended mildly.  No BS.   Wound: Clean     Lab Results:    Recent Labs  01/30/14 1525 01/30/14 1808  WBC 22.6* 32.5*  HGB 8.1* 6.9*  HCT 25.6* 21.7*  PLT 173 184    BMET   Recent Labs  01/29/14 0436 01/30/14 0455 01/30/14 1525  NA 139 139  --   K 4.2 3.7  --   CL 107 106  --   CO2 26 26  --   GLUCOSE 98 114*  --   BUN 5* 5*  --   CREATININE 0.64 0.77 0.88  CALCIUM 8.7 8.8  --     PT/INR   Recent Labs  01/30/14 0455  LABPROT 15.2  INR  1.18    ABG  No results for input(s): PHART, HCO3 in the last 72 hours.  Invalid input(s): PCO2, PO2   Studies/Results:  US Transvaginal Non-ob  01/29/2014   CLINICAL DATA:  Postmenopausal endometrial thickening seen on recent CT.  EXAM: TRANSABDOMINAL AND TRANSVAGINAL ULTRASOUND OF PELVIS  TECHNIQUE: Both transabdominal and transvaginal ultrasound examinations of the pelvis were performed. Transabdominal technique was performed for global imaging of the pelvis including uterus, ovaries, adnexal regions, and pelvic cul-de-sac. It was necessary to proceed with endovaginal exam following the transabdominal exam to visualize the endometrium and ovaries.  COMPARISON:  CT on 01/24/2014  FINDINGS: Uterus  Measurements: 6.4 x 3.0 x 4.3 cm. No myometrial masses identified. No definite cervical mass visualized.  Endometrium  Thickness: The endometrial cavity is diffusely distended with complex hypoechoic fluid, consistent with hydrometros. Endometrial thickness cannot be measured, however no focal endometrial mass visualized.  Right ovary  Measurements: Not directly visualized by transabdominal or transvaginal sonography, however no adnexal mass identified.  Left  ovary  Measurements: Not directly visualized by transabdominal or transvaginal sonography, however no adnexal mass identified.  Other findings  No free fluid.  IMPRESSION: Distended fluid-filled endometrial cavity, consistent with hydrometros. No mass or other etiology apparent by ultrasound. Consider GYN consultation; pelvic MRI without and with contrast may also be helpful for further evaluation if patient can cooperate with breath holding instructions.  Nonvisualization of ovaries, however no adnexal mass identified.   Electronically Signed   By: Earle Gell M.D.   On: 01/29/2014 15:10   US Pelvis Complete  01/29/2014   CLINICAL DATA:  Postmenopausal endometrial thickening seen on recent CT.  EXAM: TRANSABDOMINAL AND TRANSVAGINAL ULTRASOUND OF PELVIS   TECHNIQUE: Both transabdominal and transvaginal ultrasound examinations of the pelvis were performed. Transabdominal technique was performed for global imaging of the pelvis including uterus, ovaries, adnexal regions, and pelvic cul-de-sac. It was necessary to proceed with endovaginal exam following the transabdominal exam to visualize the endometrium and ovaries.  COMPARISON:  CT on 01/24/2014  FINDINGS: Uterus  Measurements: 6.4 x 3.0 x 4.3 cm. No myometrial masses identified. No definite cervical mass visualized.  Endometrium  Thickness: The endometrial cavity is diffusely distended with complex hypoechoic fluid, consistent with hydrometros. Endometrial thickness cannot be measured, however no focal endometrial mass visualized.  Right ovary  Measurements: Not directly visualized by transabdominal or transvaginal sonography, however no adnexal mass identified.  Left ovary  Measurements: Not directly visualized by transabdominal or transvaginal sonography, however no adnexal mass identified.  Other findings  No free fluid.  IMPRESSION: Distended fluid-filled endometrial cavity, consistent with hydrometros. No mass or other etiology apparent by ultrasound. Consider GYN consultation; pelvic MRI without and with contrast may also be helpful for further evaluation if patient can cooperate with breath holding instructions.  Nonvisualization of ovaries, however no adnexal mass identified.   Electronically Signed   By: Earle Gell M.D.   On: 01/29/2014 15:10     Anti-infectives:   Anti-infectives    Start     Dose/Rate Route Frequency Ordered Stop   01/30/14 1500  cefoTEtan (CEFOTAN) 2 g in dextrose 5 % 50 mL IVPB     2 g100 mL/hr over 30 Minutes Intravenous Every 12 hours 01/30/14 1344 01/30/14 1524   01/30/14 0900  cefoTEtan (CEFOTAN) 2 g in dextrose 5 % 50 mL IVPB     2 g100 mL/hr over 30 Minutes Intravenous On call to O.R. 01/29/14 1911 01/30/14 0807   01/29/14 2000  neomycin (MYCIFRADIN) tablet 1,000 mg      1,000 mg Oral Every 6 hours 01/29/14 1911 01/30/14 0737   01/29/14 2000  erythromycin (E-MYCIN) tablet 1,000 mg     1,000 mg Oral Every 6 hours 01/29/14 1911 01/30/14 0737   01/28/14 1700  cephALEXin (KEFLEX) capsule 500 mg  Status:  Discontinued     500 mg Oral 3 times per day 01/28/14 1526 01/29/14 1258   01/25/14 1356  ceFAZolin (ANCEF) 2-3 GM-% IVPB SOLR    Comments:  Margaretmary Dys   : cabinet override      01/25/14 1356 01/25/14 1402      Alphonsa Overall, MD, FACS Pager: [facility] Office: [phone] 01/30/2014

## 2014-01-30 NOTE — Op Note (Signed)
Surgeon: Kaylyn Lim, MD, FACS  Asst:  Neysa Bonito M.D. FACS  Anes:  Gen. endotracheal  Procedure: Laparoscopic assisted right hemicolectomy  Diagnosis: Cancer of the ascending colon  Complications: None  EBL:   25 cc  Drains: None  Description of Procedure:  The patient was taken to OR 1 at [facility].  After anesthesia was administered and the patient was prepped a timeout was performed.  The abdomen was entered through the left upper quadrant using a 5 mm Optiview technique. Following insufflation 2 other 5 mm trochars were placed in the midline along where eventual extraction incision will be made. The abdomen was surveyed and the liver was free of any obvious metastatic disease. The cecum was identified and the lateral attachments were taken down to begin the mobilization. I then found the duodenum and found the adhesions of the colon to the hepatic flexure and took those down using the Harmonic Scalpel. I then went further down and further mobilized the cecum and the appendix from the lateral attachments.  At this point the right colon seemed to be very mobile. When ahead and connected the 25 mm trocar incisions and open the abdomen. Wound protector was inserted. With the specimen extracted I could palpate the tumor in the ascending colon. I divided the terminal ileum near the ileocecal valve with a GIA 7.5. I divided the transverse colon to the right of the middle colic vessels with the 75 GIA. I went to the mesentery first skeletonizing the vessels with the Harmonic scalpel and then clamping multiple pedicles with Kelly clamps and then ligating these with suture ligatures of 2-0 silk the specimen was sent to pathology. Next an and functional end and anastomosis was created by aligning the terminal ileum with the transverse colon along the tenia with Ace suture. Openings were made along the antimesenteric border of the terminal ileum and along the tenia in the GIA 7.5 was inserted. It was  fired creating an anastomosis. The common defect was enclosed by first aligning the stapled and's with 3-0 silk sutures and then closing the defect with a 4-0 PDS from either and using a canal Mayo technique. An outer layer of 30 silks were placed in the seromuscular layer to complete the closure. The mesentery could easily be approximated and was closed with figure-of-eight sutures of 2-0 Vicryl.  We then followed the colon protocol removing all the contaminated equipment and rehabilitation draping. I changed gown and gloves. The incision was enclosed ran with #1 Novafils interrupted fashion and the fascia was infiltrated with Expariel diluted to 30 mL.. The abdomen was examined laparoscopically prior to closure completion and the closure looked good from the inside. No evidence of bleeding. Abdomen wasn't deflated. Ports were then closed with staples after aligning incision with 4-0 Vicryl's. Sponge and needle counts were reported as correct  The patient tolerated the procedure well and was taken to the PACU in stable condition.     Matt B. Hassell Done, [place], [facility], [place]

## 2014-01-30 NOTE — Interval H&P Note (Signed)
History and Physical Interval Note:  01/30/2014 9:17 AM  April [name]  has presented today for surgery, with the diagnosis of Right Colon Cancer  The various methods of treatment have been discussed with the patient and family. After consideration of risks, benefits and other options for treatment, the patient has consented to  Procedure(s): LAPAROSCOPIC PARTIAL RIGHT COLECTOMY (Right) as a surgical intervention .  The patient's history has been reviewed, patient examined, no change in status, stable for surgery.  I have reviewed the patient's chart and labs.  Questions were answered to the patient's satisfaction.     Loreto Loescher B

## 2014-01-30 NOTE — Progress Notes (Addendum)
EKG done per order for pre-op EKG. Sinus Rhythm with a rate of 87. Patient has loose bowel movement x 1 in bedside commode. Stool brown in color, no frank blood or black tarry stools noted.

## 2014-01-30 NOTE — Anesthesia Preprocedure Evaluation (Signed)
Anesthesia Evaluation  Patient identified by MRN, date of birth, ID band Patient awake    Reviewed: Allergy & Precautions, NPO status , Patient's Chart, lab work & pertinent test results  Airway Mallampati: II  TM Distance: >3 FB Neck ROM: Full    Dental no notable dental hx.    Pulmonary neg pulmonary ROS,  breath sounds clear to auscultation  Pulmonary exam normal       Cardiovascular hypertension, Rhythm:Regular Rate:Normal     Neuro/Psych negative neurological ROS  negative psych ROS   GI/Hepatic negative GI ROS, Neg liver ROS,   Endo/Other  negative endocrine ROS  Renal/GU negative Renal ROS  negative genitourinary   Musculoskeletal negative musculoskeletal ROS (+)   Abdominal   Peds negative pediatric ROS (+)  Hematology  (+) anemia ,   Anesthesia Other Findings   Reproductive/Obstetrics negative OB ROS                             Anesthesia Physical Anesthesia Plan  ASA: III  Anesthesia Plan: General   Post-op Pain Management:    Induction: Intravenous  Airway Management Planned: Oral ETT  Additional Equipment:   Intra-op Plan:   Post-operative Plan: Extubation in OR  Informed Consent: I have reviewed the patients History and Physical, chart, labs and discussed the procedure including the risks, benefits and alternatives for the proposed anesthesia with the patient or authorized representative who has indicated his/her understanding and acceptance.   Dental advisory given  Plan Discussed with: CRNA  Anesthesia Plan Comments:         Anesthesia Quick Evaluation

## 2014-01-31 ENCOUNTER — Encounter (HOSPITAL_COMMUNITY)
Admission: EM | Disposition: A | Discharge status: Skilled Nursing Facility | Payer: Self-pay | Source: Home / Self Care | Attending: Internal Medicine

## 2014-01-31 ENCOUNTER — Encounter (HOSPITAL_COMMUNITY): Payer: Self-pay | Admitting: Anesthesiology

## 2014-01-31 ENCOUNTER — Inpatient Hospital Stay (HOSPITAL_COMMUNITY): Payer: Medicare Other | Admitting: Anesthesiology

## 2014-01-31 ENCOUNTER — Inpatient Hospital Stay (HOSPITAL_COMMUNITY): Payer: Medicare Other

## 2014-01-31 HISTORY — PX: LAPAROTOMY: SHX154

## 2014-01-31 LAB — CBC
HCT: 23.4 % — ABNORMAL LOW (ref 36.0–46.0)
HCT: 16.4 % — ABNORMAL LOW (ref 36.0–46.0)
HCT: 32.8 % — ABNORMAL LOW (ref 36.0–46.0)
Hemoglobin: 7.9 g/dL — ABNORMAL LOW (ref 12.0–15.0)
Hemoglobin: 5.6 g/dL — CL (ref 12.0–15.0)
Hemoglobin: 10.9 g/dL — ABNORMAL LOW (ref 12.0–15.0)
MCH: 29.4 pg (ref 26.0–34.0)
MCH: 29.2 pg (ref 26.0–34.0)
MCH: 28 pg (ref 26.0–34.0)
MCHC: 33.8 g/dL (ref 30.0–36.0)
MCHC: 34.1 g/dL (ref 30.0–36.0)
MCHC: 33.2 g/dL (ref 30.0–36.0)
MCV: 87 fL (ref 78.0–100.0)
MCV: 85.4 fL (ref 78.0–100.0)
MCV: 84.3 fL (ref 78.0–100.0)
PLATELETS: 116 10*3/uL — AB (ref 150–400)
PLATELETS: 120 10*3/uL — AB (ref 150–400)
PLATELETS: 86 10*3/uL — AB (ref 150–400)
RBC: 2.69 MIL/uL — ABNORMAL LOW (ref 3.87–5.11)
RBC: 1.92 MIL/uL — ABNORMAL LOW (ref 3.87–5.11)
RBC: 3.89 MIL/uL (ref 3.87–5.11)
RDW: 15.7 % — AB (ref 11.5–15.5)
RDW: 15.7 % — AB (ref 11.5–15.5)
RDW: 15.4 % (ref 11.5–15.5)
WBC: 30.2 10*3/uL — ABNORMAL HIGH (ref 4.0–10.5)
WBC: 39.1 10*3/uL — AB (ref 4.0–10.5)
WBC: 31.9 10*3/uL — AB (ref 4.0–10.5)

## 2014-01-31 LAB — CBC WITH DIFFERENTIAL/PLATELET
BASOS PCT: 1 % (ref 0–1)
Basophils Absolute: 0.3 10*3/uL — ABNORMAL HIGH (ref 0.0–0.1)
Eosinophils Absolute: 3.9 10*3/uL — ABNORMAL HIGH (ref 0.0–0.7)
Eosinophils Relative: 13 % — ABNORMAL HIGH (ref 0–5)
HEMATOCRIT: 31.7 % — AB (ref 36.0–46.0)
HEMOGLOBIN: 10.5 g/dL — AB (ref 12.0–15.0)
LYMPHS ABS: 1.2 10*3/uL (ref 0.7–4.0)
Lymphocytes Relative: 4 % — ABNORMAL LOW (ref 12–46)
MCH: 29 pg (ref 26.0–34.0)
MCHC: 33.1 g/dL (ref 30.0–36.0)
MCV: 87.6 fL (ref 78.0–100.0)
MONO ABS: 1.2 10*3/uL — AB (ref 0.1–1.0)
MONOS PCT: 4 % (ref 3–12)
Neutro Abs: 23.4 10*3/uL — ABNORMAL HIGH (ref 1.7–7.7)
Neutrophils Relative %: 78 % — ABNORMAL HIGH (ref 43–77)
Platelets: 83 10*3/uL — ABNORMAL LOW (ref 150–400)
RBC: 3.62 MIL/uL — ABNORMAL LOW (ref 3.87–5.11)
RDW: 15.5 % (ref 11.5–15.5)
WBC: 30 10*3/uL — ABNORMAL HIGH (ref 4.0–10.5)

## 2014-01-31 LAB — BASIC METABOLIC PANEL
ANION GAP: 7 (ref 5–15)
Anion gap: 7 (ref 5–15)
BUN: 11 mg/dL (ref 6–23)
BUN: 10 mg/dL (ref 6–23)
CALCIUM: 7.3 mg/dL — AB (ref 8.4–10.5)
CHLORIDE: 111 meq/L (ref 96–112)
CO2: 19 mmol/L (ref 19–32)
CO2: 21 mmol/L (ref 19–32)
CREATININE: 0.9 mg/dL (ref 0.50–1.10)
Calcium: 7.4 mg/dL — ABNORMAL LOW (ref 8.4–10.5)
Chloride: 109 mEq/L (ref 96–112)
Creatinine, Ser: 0.85 mg/dL (ref 0.50–1.10)
GFR calc Af Amer: 65 mL/min — ABNORMAL LOW (ref >90)
GFR calc Af Amer: 70 mL/min — ABNORMAL LOW (ref >90)
GFR calc non Af Amer: 56 mL/min — ABNORMAL LOW (ref >90)
GFR, EST NON AFRICAN AMERICAN: 60 mL/min — AB (ref >90)
GLUCOSE: 262 mg/dL — AB (ref 70–99)
GLUCOSE: 257 mg/dL — AB (ref 70–99)
Potassium: 3.5 mmol/L (ref 3.5–5.1)
Potassium: 4.4 mmol/L (ref 3.5–5.1)
SODIUM: 137 mmol/L (ref 135–145)
Sodium: 137 mmol/L (ref 135–145)

## 2014-01-31 LAB — GLUCOSE, CAPILLARY
GLUCOSE-CAPILLARY: 164 mg/dL — AB (ref 70–99)
GLUCOSE-CAPILLARY: 164 mg/dL — AB (ref 70–99)
Glucose-Capillary: 205 mg/dL — ABNORMAL HIGH (ref 70–99)

## 2014-01-31 LAB — APTT: APTT: 24 s (ref 24–37)

## 2014-01-31 LAB — PREPARE FRESH FROZEN PLASMA
UNIT DIVISION: 0
Unit division: 0
Unit division: 0

## 2014-01-31 LAB — PROTIME-INR
INR: 1.21 (ref 0.00–1.49)
Prothrombin Time: 15.4 seconds — ABNORMAL HIGH (ref 11.6–15.2)

## 2014-01-31 LAB — PREPARE RBC (CROSSMATCH)

## 2014-01-31 SURGERY — LAPAROTOMY, EXPLORATORY
Anesthesia: General

## 2014-01-31 MED ORDER — NEOSTIGMINE METHYLSULFATE 10 MG/10ML IV SOLN
INTRAVENOUS | Status: DC | PRN
  Administered 2014-01-31: 3 mg via INTRAVENOUS

## 2014-01-31 MED ORDER — OXYCODONE HCL 5 MG PO TABS: 5.0000 mg | ORAL_TABLET | Freq: Once | ORAL | Status: DC | PRN

## 2014-01-31 MED ORDER — GLYCOPYRROLATE 0.2 MG/ML IJ SOLN
INTRAMUSCULAR | Status: AC
  Filled 2014-01-31: qty 2

## 2014-01-31 MED ORDER — LACTATED RINGERS IV SOLN
INTRAVENOUS | Status: DC | PRN
  Administered 2014-01-31: 11:00:00 via INTRAVENOUS

## 2014-01-31 MED ORDER — FENTANYL CITRATE 0.05 MG/ML IJ SOLN
INTRAMUSCULAR | Status: AC
  Filled 2014-01-31: qty 5

## 2014-01-31 MED ORDER — PROPOFOL 10 MG/ML IV BOLUS
INTRAVENOUS | Status: DC | PRN
  Administered 2014-01-31: 100 mg via INTRAVENOUS

## 2014-01-31 MED ORDER — SODIUM CHLORIDE 0.9 % IJ SOLN
INTRAMUSCULAR | Status: AC
  Filled 2014-01-31: qty 10

## 2014-01-31 MED ORDER — LIDOCAINE HCL (CARDIAC) 20 MG/ML IV SOLN
INTRAVENOUS | Status: AC
  Filled 2014-01-31: qty 5

## 2014-01-31 MED ORDER — ROCURONIUM BROMIDE 100 MG/10ML IV SOLN
INTRAVENOUS | Status: DC | PRN
  Administered 2014-01-31: 10 mg via INTRAVENOUS
  Administered 2014-01-31: 25 mg via INTRAVENOUS

## 2014-01-31 MED ORDER — SODIUM CHLORIDE 0.9 % IV SOLN
Freq: Once | INTRAVENOUS | Status: AC
  Administered 2014-01-31: 07:00:00 via INTRAVENOUS

## 2014-01-31 MED ORDER — SUCCINYLCHOLINE CHLORIDE 20 MG/ML IJ SOLN
INTRAMUSCULAR | Status: DC | PRN
  Administered 2014-01-31: 80 mg via INTRAVENOUS

## 2014-01-31 MED ORDER — SODIUM CHLORIDE 0.9 % IV SOLN
Freq: Once | INTRAVENOUS | Status: AC
  Administered 2014-01-31: 01:00:00 via INTRAVENOUS

## 2014-01-31 MED ORDER — MORPHINE SULFATE 2 MG/ML IJ SOLN
1.0000 mg | INTRAMUSCULAR | Status: DC | PRN
  Administered 2014-01-31 – 2014-02-01 (×5): 2 mg via INTRAVENOUS
  Administered 2014-02-01: 3 mg via INTRAVENOUS
  Administered 2014-02-01 – 2014-02-02 (×3): 2 mg via INTRAVENOUS
  Administered 2014-02-02 – 2014-02-03 (×4): 3 mg via INTRAVENOUS
  Filled 2014-01-31: qty 2
  Filled 2014-01-31 (×5): qty 1
  Filled 2014-01-31 (×2): qty 2
  Filled 2014-01-31: qty 1
  Filled 2014-01-31: qty 2
  Filled 2014-01-31: qty 1
  Filled 2014-01-31: qty 2
  Filled 2014-01-31: qty 1

## 2014-01-31 MED ORDER — FENTANYL CITRATE 0.05 MG/ML IJ SOLN
INTRAMUSCULAR | Status: DC | PRN
  Administered 2014-01-31: 50 ug via INTRAVENOUS
  Administered 2014-01-31 (×4): 25 ug via INTRAVENOUS

## 2014-01-31 MED ORDER — NEOSTIGMINE METHYLSULFATE 10 MG/10ML IV SOLN
INTRAVENOUS | Status: AC
  Filled 2014-01-31: qty 1

## 2014-01-31 MED ORDER — LACTATED RINGERS IV SOLN
INTRAVENOUS | Status: DC
  Administered 2014-01-31: 1000 mL via INTRAVENOUS

## 2014-01-31 MED ORDER — DEXTROSE 5 % IV SOLN
2.0000 g | INTRAVENOUS | Status: DC | PRN
  Administered 2014-01-31: 2 g via INTRAVENOUS

## 2014-01-31 MED ORDER — DEXTROSE 5 % IV SOLN
INTRAVENOUS | Status: AC
  Filled 2014-01-31: qty 2

## 2014-01-31 MED ORDER — CEFOTETAN DISODIUM-DEXTROSE 2-2.08 GM-% IV SOLR
INTRAVENOUS | Status: AC
  Filled 2014-01-31: qty 50

## 2014-01-31 MED ORDER — OXYCODONE HCL 5 MG/5ML PO SOLN: 5.0000 mg | Freq: Once | ORAL | Status: DC | PRN

## 2014-01-31 MED ORDER — GLYCOPYRROLATE 0.2 MG/ML IJ SOLN
INTRAMUSCULAR | Status: DC | PRN
  Administered 2014-01-31: 0.4 mg via INTRAVENOUS

## 2014-01-31 MED ORDER — HYDROMORPHONE HCL 1 MG/ML IJ SOLN: 0.2500 mg | INTRAMUSCULAR | Status: DC | PRN

## 2014-01-31 MED ORDER — ROCURONIUM BROMIDE 100 MG/10ML IV SOLN
INTRAVENOUS | Status: AC
  Filled 2014-01-31: qty 1

## 2014-01-31 MED ORDER — DEXTROSE 5 % IV SOLN
10.0000 mg | INTRAVENOUS | Status: DC | PRN
  Administered 2014-01-31: 10 ug/min via INTRAVENOUS

## 2014-01-31 MED ORDER — SODIUM CHLORIDE 0.9 % IV SOLN: 10.0000 mL/h | Freq: Once | INTRAVENOUS | Status: DC

## 2014-01-31 MED ORDER — LACTATED RINGERS IV SOLN
INTRAVENOUS | Status: DC | PRN
  Administered 2014-01-31: 10:00:00 via INTRAVENOUS

## 2014-01-31 MED ORDER — PROPOFOL 10 MG/ML IV BOLUS
INTRAVENOUS | Status: AC
  Filled 2014-01-31: qty 20

## 2014-01-31 MED ORDER — ONDANSETRON HCL 4 MG/2ML IJ SOLN
INTRAMUSCULAR | Status: DC | PRN
  Administered 2014-01-31: 4 mg via INTRAVENOUS

## 2014-01-31 MED ORDER — EPHEDRINE SULFATE 50 MG/ML IJ SOLN
INTRAMUSCULAR | Status: AC
  Filled 2014-01-31: qty 1

## 2014-01-31 MED ORDER — ONDANSETRON HCL 4 MG/2ML IJ SOLN
INTRAMUSCULAR | Status: AC
  Filled 2014-01-31: qty 2

## 2014-01-31 SURGICAL SUPPLY — 46 items
APPLICATOR COTTON TIP 6IN STRL (MISCELLANEOUS) ×3 IMPLANT
BLADE EXTENDED COATED 6.5IN (ELECTRODE) IMPLANT
BLADE HEX COATED 2.75 (ELECTRODE) ×1 IMPLANT
CELLS DAT CNTRL 66122 CELL SVR (MISCELLANEOUS) ×1 IMPLANT
COVER MAYO STAND STRL (DRAPES) ×5 IMPLANT
DRAIN CHANNEL 19F RND (DRAIN) IMPLANT
DRAPE LAPAROSCOPIC ABDOMINAL (DRAPES) ×3 IMPLANT
DRAPE WARM FLUID 44X44 (DRAPE) ×3 IMPLANT
DRSG OPSITE POSTOP 4X8 (GAUZE/BANDAGES/DRESSINGS) ×2 IMPLANT
ELECT REM PT RETURN 9FT ADLT (ELECTROSURGICAL) ×3
ELECTRODE REM PT RTRN 9FT ADLT (ELECTROSURGICAL) ×1 IMPLANT
EVACUATOR SILICONE 100CC (DRAIN) IMPLANT
GAUZE SPONGE 4X4 12PLY STRL (GAUZE/BANDAGES/DRESSINGS) ×1 IMPLANT
GLOVE BIOGEL M 8.0 STRL (GLOVE) ×19 IMPLANT
GOWN SPEC L4 XLG W/TWL (GOWN DISPOSABLE) ×3 IMPLANT
GOWN STRL REUS W/TWL XL LVL3 (GOWN DISPOSABLE) ×9 IMPLANT
HANDLE STAPLE EGIA 4 XL (STAPLE) ×2 IMPLANT
KIT BASIN OR (CUSTOM PROCEDURE TRAY) ×5 IMPLANT
LIGASURE IMPACT 36 18CM CVD LR (INSTRUMENTS) ×2 IMPLANT
NS IRRIG 1000ML POUR BTL (IV SOLUTION) ×6 IMPLANT
PACK GENERAL/GYN (CUSTOM PROCEDURE TRAY) ×3 IMPLANT
PEN SKIN MARKING BROAD (MISCELLANEOUS) ×2 IMPLANT
PENCIL BUTTON HOLSTER BLD 10FT (ELECTRODE) ×2 IMPLANT
RELOAD EGIA 60 TAN VASC (STAPLE) ×2 IMPLANT
RELOAD TRI 60 ART MED THCK PUR (STAPLE) ×4 IMPLANT
RETRACTOR WND ALEXIS 18 MED (MISCELLANEOUS) IMPLANT
RTRCTR WOUND ALEXIS 18CM MED (MISCELLANEOUS) ×3
SPONGE LAP 18X18 X RAY DECT (DISPOSABLE) ×2 IMPLANT
STAPLER VISISTAT 35W (STAPLE) ×3 IMPLANT
SUCTION POOLE TIP (SUCTIONS) ×4 IMPLANT
SUT NOVA 1 T20/GS 25DT (SUTURE) ×4 IMPLANT
SUT PDS AB 1 CTX 36 (SUTURE) ×2 IMPLANT
SUT SILK 2 0 (SUTURE)
SUT SILK 2 0 SH CR/8 (SUTURE) ×3 IMPLANT
SUT SILK 2-0 18XBRD TIE 12 (SUTURE) ×1 IMPLANT
SUT SILK 3 0 (SUTURE)
SUT SILK 3 0 SH CR/8 (SUTURE) ×5 IMPLANT
SUT SILK 3-0 18XBRD TIE 12 (SUTURE) ×1 IMPLANT
SUT VIC AB 2-0 SH 18 (SUTURE) ×2 IMPLANT
SUT VIC AB 3-0 SH 18 (SUTURE) IMPLANT
SUT VICRYL 2 0 18  UND BR (SUTURE) ×2
SUT VICRYL 2 0 18 UND BR (SUTURE) IMPLANT
SYR BULB IRRIGATION 50ML (SYRINGE) ×2 IMPLANT
TOWEL OR 17X26 10 PK STRL BLUE (TOWEL DISPOSABLE) ×5 IMPLANT
TOWEL OR NON WOVEN STRL DISP B (DISPOSABLE) ×5 IMPLANT
TRAY FOLEY CATH 14FRSI W/METER (CATHETERS) ×1 IMPLANT

## 2014-01-31 NOTE — Transfer of Care (Signed)
Immediate Anesthesia Transfer of Care Note  Patient: April [name]  Procedure(s) Performed: Procedure(s): EXPLORATORY LAPAROTOMY, RESECTION OF PRIOR SMALL BOWEL ANASTOMOSIS, UPPER ENDOSCOPY, CREATED A  NEW ILEO COLOSTOMY (N/A)  Patient Location: PACU  Anesthesia Type:General  Level of Consciousness: awake, alert  and oriented  Airway & Oxygen Therapy: Patient Spontanous Breathing and Patient connected to face mask oxygen  Post-op Assessment: Report given to PACU RN and Post -op Vital signs reviewed and stable  Post vital signs: Reviewed and stable  Complications: No apparent anesthesia complications

## 2014-01-31 NOTE — Progress Notes (Signed)
[facility] PROGRESS NOTE  April [name] Fedrick JSE:[id number] DOB: 1925-10-04 DOA: 01/24/2014 PCP: Tommy Medal, MD  Assessment/Plan: Hematochezia secondary to cancer of the ascending colon Pathology shows adenocarcinoma of the colon. Biopsies from upper endoscopy shows duodenitis. No malignancy noted. Plan is for resection of the colonic mass today. General surgery is following. Patient presented with hematochezia. She underwent a nuclear scan initially which did identify an area of concern in the mid ascending colon. Angiogram was done however, could not identify the bleeding vessel. Her bleeding subsequently subsided. She underwent EGD and colonoscopy as mentioned above. A colonic mass was found in the proximal colon. Patient underwent Laparoscopic R hemicolectomy on 1/5. Large post-operative bleed noted (see below)  Leukocytosis with eosinophilia  WBC remains elevated. Continue to monitor. Probably due to the cancer  Acute blood loss anemia  Large drop in hgb post-operatively. Pt was taken back to OR and is now s/p ex-lap with resection of small bowel anastamosis and new ileocolostomy. Follow cbc closely and transfuse as needed  Thickened endometrium/fluid-filled uterus This was incidentally noted on the CT during this admission, as well as a previous CT scan done in September. Had a long discussion with the patient regarding the same. She denies any vaginal bleeding. Initially plan was for outpatient management, but considering a new finding of colonic mass. An ultrasound was done yesterday. It showed fluid-filled uterus. Was recently discussed with GYN, recommend outpatient management for the same. No need for urgent intervention..  E Coli on Urine Culture Not truly symptomatic, but was treated with Keflex. Keflex was stopped due to bowel prep for GI surgery. Can likely observe off of antibiotics for now.  Code Status: Full Family Communication: Pt in room, family at bedside (indicate  person spoken with, relationship, and if by phone, the number) Disposition Plan: Pending   Consultants:  General surgery  Procedures:  Laparoscopic colectomy 01/30/14  Ex-lap with resection of SB anastamosis and new ileocolostomy 01/31/14  Antibiotics:  none  HPI/Subjective: No complaints. Large blood per rectum earlier this AM with subsequent drop in hgb noted.  Objective: Filed Vitals:   01/31/14 1530 01/31/14 1600 01/31/14 1630 01/31/14 1657  BP: 114/47 122/46 123/56   Pulse: 94 80 96 95  Temp:      TempSrc:      Resp: 13 11 20 19   Height:      Weight:      SpO2: 97% 97% 91% 97%    Intake/Output Summary (Last 24 hours) at 01/31/14 1703 Last data filed at 01/31/14 1600  Gross per 24 hour  Intake   9363 ml  Output    590 ml  Net   8773 ml   Filed Weights   01/25/14 0100 01/30/14 1900  Weight: 68.5 kg (151 lb 0.2 oz) 72.2 kg (159 lb 2.8 oz)    Exam:   General:  Awake, in nad  Cardiovascular: regular, s1, s2  Respiratory: normal resp effort, no wheezing  Abdomen: soft, nondistended  Musculoskeletal: perfused, no clubbing   Data Reviewed: Basic Metabolic Panel:  Recent Labs Lab 01/25/14 0643  01/28/14 0608 01/29/14 0436 01/30/14 0455 01/30/14 1525 01/30/14 2331 01/31/14 0340  NA 140  < > 139 139 139  --  137 137  K 4.1  < > 3.3* 4.2 3.7  --  4.4 3.5  CL 112  < > 107 107 106  --  111 109  CO2 23  < > 23 26 26   --  19 21  GLUCOSE 117*  < >  113* 98 114*  --  257* 262*  BUN 13  < > 8 5* 5*  --  10 11  CREATININE 0.63  < > 0.83 0.64 0.77 0.88 0.90 0.85  CALCIUM 7.8*  < > 9.0 8.7 8.8  --  7.4* 7.3*  MG 1.8  --   --   --   --   --   --   --   PHOS 3.2  --   --   --   --   --   --   --   < > = values in this interval not displayed. Liver Function Tests:  Recent Labs Lab 01/25/14 0643  AST 16  ALT 12  ALKPHOS 45  BILITOT 1.0  PROT 4.9*  ALBUMIN 3.2*   No results for input(s): LIPASE, AMYLASE in the last 168 hours. No results for  input(s): AMMONIA in the last 168 hours. CBC:  Recent Labs Lab 01/24/14 1837  01/29/14 0436  01/30/14 1808 01/30/14 2331 01/31/14 0340 01/31/14 0715 01/31/14 1432  WBC 23.1*  < > 18.7*  < > 32.5* 30.0* 30.2* 39.1* 31.9*  NEUTROABS 10.1*  --  10.3*  --   --  23.4*  --   --   --   HGB 10.7*  < > 9.4*  < > 6.9* 10.5* 7.9* 5.6* 10.9*  HCT 33.8*  < > 29.4*  < > 21.7* 31.7* 23.4* 16.4* 32.8*  MCV 80.3  < > 86.7  < > 86.5 87.6 87.0 85.4 84.3  PLT 172  < > 169  < > 184 83* 116* 120* 86*  < > = values in this interval not displayed. Cardiac Enzymes: No results for input(s): CKTOTAL, CKMB, CKMBINDEX, TROPONINI in the last 168 hours. BNP (last 3 results) No results for input(s): PROBNP in the last 8760 hours. CBG:  Recent Labs Lab 01/31/14 0919 01/31/14 1227 01/31/14 1255  GLUCAP 205* 164* 164*    Recent Results (from the past 240 hour(s))  MRSA PCR Screening     Status: None   Collection Time: 01/25/14 12:59 AM  Result Value Ref Range Status   MRSA by PCR NEGATIVE NEGATIVE Final    Comment:        The GeneXpert MRSA Assay (FDA approved for NASAL specimens only), is one component of a comprehensive MRSA colonization surveillance program. It is not intended to diagnose MRSA infection nor to guide or monitor treatment for MRSA infections.   Urine culture     Status: None   Collection Time: 01/25/14  4:40 AM  Result Value Ref Range Status   Specimen Description URINE, CLEAN CATCH  Final   Special Requests NONE  Final   Colony Count   Final    >=100,000 COLONIES/ML Performed at [facility]    Culture   Final    ESCHERICHIA COLI Performed at [facility]    Report Status 01/28/2014 FINAL  Final   Organism ID, Bacteria ESCHERICHIA COLI  Final      Susceptibility   Escherichia coli - MIC*    AMPICILLIN 4 SENSITIVE Sensitive     CEFAZOLIN <=4 SENSITIVE Sensitive     CEFTRIAXONE <=1 SENSITIVE Sensitive     CIPROFLOXACIN <=0.25 SENSITIVE Sensitive      GENTAMICIN <=1 SENSITIVE Sensitive     LEVOFLOXACIN <=0.12 SENSITIVE Sensitive     NITROFURANTOIN <=16 SENSITIVE Sensitive     TOBRAMYCIN <=1 SENSITIVE Sensitive     TRIMETH/SULFA <=20 SENSITIVE Sensitive  PIP/TAZO <=4 SENSITIVE Sensitive     * ESCHERICHIA COLI  Surgical pcr screen     Status: Abnormal   Collection Time: 01/29/14  2:52 PM  Result Value Ref Range Status   MRSA, PCR NEGATIVE NEGATIVE Final   Staphylococcus aureus POSITIVE (A) NEGATIVE Final    Comment:        The Xpert SA Assay (FDA approved for NASAL specimens in patients over 32 years of age), is one component of a comprehensive surveillance program.  Test performance has been validated by [employer] for patients greater than or equal to 21 year old. It is not intended to diagnose infection nor to guide or monitor treatment.      Studies: Port Chest Xray - Line Placement  01/31/2014   CLINICAL DATA:  Central line placement.  EXAM: PORTABLE CHEST - 1 VIEW SAME DAY  COMPARISON:  05/26/2009  FINDINGS: The patient is mildly rotated to the right. There has been interval placement of right jugular central venous catheter tip projecting over the cavoatrial junction. Cardiac silhouette is within normal limits. The lungs are hypoinflated with mild elevation of the right hemidiaphragm. Mild bibasilar opacities are present. No definite pleural effusion or pneumothorax is identified. Right upper quadrant abdominal surgical clips are noted.  IMPRESSION: 1. Right jugular catheter tip near the cavoatrial junction. 2. Hypoinflation with bibasilar subsegmental atelectasis.   Electronically Signed   By: Logan Bores   On: 01/31/2014 13:35    Scheduled Meds:  Continuous Infusions: . dextrose 5 % and 0.45 % NaCl with KCl 20 mEq/L 100 mL/hr at 01/31/14 1520    Principal Problem:   Colon cancer Active Problems:   hx: breast cancer, DCIS, right, receptor +   Colonic hemorrhage   Thickened endometrium   Acute blood loss  anemia   Rectal bleeding   Colonic mass   Lap assisted right hemicolectomy Jan 2016  Time spent: 55min  Jahniah Pallas, [facility] Pager [phone]. If 7PM-7AM, please contact night-coverage at [email or web address], password [facility] 01/31/2014, 5:03 PM  LOS: 7 days

## 2014-01-31 NOTE — Plan of Care (Signed)
Problem: Phase I Progression Outcomes Goal: Pain controlled with appropriate interventions Outcome: Progressing Taken back to OR today due to bleeding rectally.

## 2014-01-31 NOTE — Brief Op Note (Signed)
01/24/2014 - 01/31/2014  1:26 PM  PATIENT:  April [name]  81 y.o. female  PRE-OPERATIVE DIAGNOSIS:  bleeding  POST-OPERATIVE DIAGNOSIS:  Gi blood loss  PROCEDURE:  Procedure(s): EXPLORATORY LAPAROTOMY, RESECTION OF PRIOR SMALL BOWEL ANASTOMOSIS, UPPER ENDOSCOPY, CREATED A  NEW ILEO COLOSTOMY (N/A)  SURGEON:  Surgeon(s) and Role:    * Pedro Earls, MD - Primary  PHYSICIAN ASSISTANT:   ASSISTANTS: none   ANESTHESIA:   general  EBL:  Total I/O In: 0375 [I.V.:3775; Blood:1385; IV Piggyback:50] Out: 265 [Urine:165; Blood:100]  BLOOD ADMINISTERED:1 CC PRBC  DRAINS: none   LOCAL MEDICATIONS USED:  NONE  SPECIMEN:  Source of Specimen:  ileocolonic anastomosis resected  DISPOSITION OF SPECIMEN:  PATHOLOGY  COUNTS:  YES  TOURNIQUET:  * No tourniquets in log *  DICTATION: .Other Dictation: Dictation Number [id number]  PLAN OF CARE: return to stepdown unit  PATIENT DISPOSITION:  PACU - hemodynamically stable.   Delay start of Pharmacological VTE agent (>24hrs) due to surgical blood loss or risk of bleeding: yes

## 2014-01-31 NOTE — Anesthesia Postprocedure Evaluation (Signed)
  Anesthesia Post-op Note  Patient: April [name]  Procedure(s) Performed: Procedure(s): EXPLORATORY LAPAROTOMY, RESECTION OF PRIOR SMALL BOWEL ANASTOMOSIS, UPPER ENDOSCOPY, CREATED A  NEW ILEO COLOSTOMY (N/A)  Patient Location: PACU  Anesthesia Type:General  Level of Consciousness: awake and alert   Airway and Oxygen Therapy: Patient Spontanous Breathing and Patient connected to nasal cannula oxygen  Post-op Pain: mild  Post-op Assessment: Post-op Vital signs reviewed, Patient's Cardiovascular Status Stable, Respiratory Function Stable, Patent Airway, No signs of Nausea or vomiting and Pain level controlled  Post-op Vital Signs: Reviewed and stable  Last Vitals:  Filed Vitals:   01/31/14 1348  BP: 124/54  Pulse: 92  Temp: 36.8 C  Resp: 22    Complications: No apparent anesthesia complications

## 2014-01-31 NOTE — Progress Notes (Signed)
Her night was okay.  Her daughter, [name], at bedside all night.  In speaking with nursing, it appeared that she her stools "cleared" around midnight.  But then she started having bright red blood per rectum around 4:30 AM.  Her pressures have remained stable.  The patient's main complaint is a dry mouth.  Her hgb at 3:30 AM was 7.9 and platelets at 116,000.  So far, since 7:00 PM last night, she has had 3 units PRBC, 4 units FFP, and one 10 pack of platelets (the platelets were given after the last CBC).  This AM (6:45 AM) I ordered 2 more units of PRBC and one unit of FFP.  BP 116/48 mmHg  Pulse 96  Temp(Src) 98 F (36.7 C) (Axillary)  Resp 18  Ht 5\' 5"  (1.651 m)  Wt 159 lb 2.8 oz (72.2 kg)  BMI 26.49 kg/m2  SpO2 100%  Appears to have quit bleeding and then restarted.  I have discussed findings with the patient and her daughter.  I have discussed case with Dr. Hassell Done.  Alphonsa Overall, MD, [facility] Pager: [phone] Office phone:  [phone]

## 2014-01-31 NOTE — Anesthesia Procedure Notes (Signed)
Procedure Name: Intubation Date/Time: 01/31/2014 10:18 AM Performed by: Glory Buff Pre-anesthesia Checklist: Patient identified, Emergency Drugs available, Suction available, Patient being monitored and Timeout performed Oxygen Delivery Method: Circle system utilized Preoxygenation: Pre-oxygenation with 100% oxygen Intubation Type: IV induction and Cricoid Pressure applied Laryngoscope Size: Miller and 3 Grade View: Grade II Tube type: Oral Tube size: 7.0 mm Number of attempts: 2 Airway Equipment and Method: Bougie stylet Placement Confirmation: ETT inserted through vocal cords under direct vision,  positive ETCO2 and breath sounds checked- equal and bilateral Secured at: 20 cm Tube secured with: Tape Dental Injury: Teeth and Oropharynx as per pre-operative assessment

## 2014-01-31 NOTE — Anesthesia Preprocedure Evaluation (Addendum)
Anesthesia Evaluation  Patient identified by MRN, date of birth, ID band Patient awake    Reviewed: Allergy & Precautions, NPO status , Patient's Chart, lab work & pertinent test results  History of Anesthesia Complications (+) history of anesthetic complications  Airway Mallampati: II  TM Distance: >3 FB Neck ROM: Full    Dental  (+) Teeth Intact   Pulmonary neg pulmonary ROS,  breath sounds clear to auscultation        Cardiovascular hypertension, Pt. on medications Rhythm:Regular Rate:Tachycardia     Neuro/Psych negative neurological ROS     GI/Hepatic Neg liver ROS, Colon cancer s/p resection with bleed   Endo/Other  BG elevated preop  Renal/GU      Musculoskeletal   Abdominal   Peds  Hematology  (+) anemia , Active gi bleed with hgb 5   Anesthesia Other Findings   Reproductive/Obstetrics                            Anesthesia Physical Anesthesia Plan  ASA: IV and emergent  Anesthesia Plan:    Post-op Pain Management:    Induction: Intravenous  Airway Management Planned: Oral ETT  Additional Equipment: Arterial line, CVP and Ultrasound Guidance Line Placement  Intra-op Plan:   Post-operative Plan: Extubation in OR and Possible Post-op intubation/ventilation  Informed Consent: I have reviewed the patients History and Physical, chart, labs and discussed the procedure including the risks, benefits and alternatives for the proposed anesthesia with the patient or authorized representative who has indicated his/her understanding and acceptance.   Dental advisory given  Plan Discussed with: CRNA and Surgeon  Anesthesia Plan Comments:         Anesthesia Quick Evaluation

## 2014-02-01 ENCOUNTER — Encounter (HOSPITAL_COMMUNITY): Payer: Self-pay | Admitting: Surgery

## 2014-02-01 LAB — PREPARE PLATELET PHERESIS: UNIT DIVISION: 0

## 2014-02-01 LAB — CBC
HEMATOCRIT: 27.7 % — AB (ref 36.0–46.0)
Hemoglobin: 9.4 g/dL — ABNORMAL LOW (ref 12.0–15.0)
MCH: 28.3 pg (ref 26.0–34.0)
MCHC: 33.9 g/dL (ref 30.0–36.0)
MCV: 83.4 fL (ref 78.0–100.0)
PLATELETS: 102 10*3/uL — AB (ref 150–400)
RBC: 3.32 MIL/uL — ABNORMAL LOW (ref 3.87–5.11)
RDW: 16.3 % — ABNORMAL HIGH (ref 11.5–15.5)
WBC: 32.3 10*3/uL — AB (ref 4.0–10.5)

## 2014-02-01 LAB — BASIC METABOLIC PANEL
ANION GAP: 2 — AB (ref 5–15)
BUN: 9 mg/dL (ref 6–23)
CO2: 25 mmol/L (ref 19–32)
Calcium: 7.1 mg/dL — ABNORMAL LOW (ref 8.4–10.5)
Chloride: 113 mEq/L — ABNORMAL HIGH (ref 96–112)
Creatinine, Ser: 0.64 mg/dL (ref 0.50–1.10)
GFR calc Af Amer: >90 mL/min (ref >90)
GFR, EST NON AFRICAN AMERICAN: 79 mL/min — AB (ref >90)
Glucose, Bld: 168 mg/dL — ABNORMAL HIGH (ref 70–99)
Potassium: 3.7 mmol/L (ref 3.5–5.1)
SODIUM: 140 mmol/L (ref 135–145)

## 2014-02-01 NOTE — Op Note (Signed)
NAMESELENE, PELTZER NO.:  [id number]  MEDICAL RECORD NO.:  [id number]  LOCATION:  1027                         FACILITY:  [facility]  PHYSICIAN:  Isabel Caprice. Hassell Done, MD  DATE OF BIRTH:  08-02-1925  DATE OF PROCEDURE: DATE OF DISCHARGE:                              OPERATIVE REPORT   PREOPERATIVE DIAGNOSIS:  One day postop right hemicolectomy with persistent bleeding presumably from the stapled anastomosis.  PROCEDURES:  Exploratory laparotomy, upper endoscopy, resection of prior anastomosis and creation of a new ileo-colonic anastomosis.  SURGEON:  Isabel Caprice. Hassell Done, MD.  ASSISTANT:  None.  ANESTHESIA:  General endotracheal.  DESCRIPTION OF PROCEDURE:  The patient was taken to room 12 at [facility] and given general anesthesia.  Preoperatively, there were some anesthetic delays in trying to get lines in.  Following a time-out and after prepping with PCMX, the old staples were removed and the abdomen was entered through the midline incision.  I removed interrupted #1 Novafil sutures and put a wound protector in place.  I aspirated approximately 200 mL of bloody-stained fluid from the abdomen, but there was no gross clot or evidence of any significant intraabdominal hemorrhage.  The prior anastomosis was identified and from the outside, everything appeared unremarkable.  I then went ahead and found the terminal ileal end and followed that proximally almost to the ligament of Treitz.  I saw some areas with some blood in it near the anastomosis, approximately it was decompressed and unremarkable appearing.  Wishing to make certain that there was no upper GI bleeding.  I went ahead and performed upper endoscopy with the endoscope.  I passed this through the esophagus and into the stomach.  I minimized the insufflation, but saw bowel in the stomach, but no evidence of bleeding, clots or any other evidence of immediate or remote bleeding.  The endoscope was  withdrawn.  Back in the abdomen, I went ahead and resected the prior anastomosis.  I felt that I would do this and felt that this would minimize the contamination in the operative field.  This was done by first skeletonizing the two limbs coming into the anastomosis and I transected those with a Covidien endoscopic stapler using the purple load with the TRS reinforcement on them.  That was two firings of the purple load on the ends of the bowel.  The bowel was then removed by going through the mesentery right near the bowel, taking out the old anastomosis using the LigaSure, removing this and putting it on the back table to be opened later.  A new anastomosis was then created by lining the small intestine along the tenia of the transverse colon.  I opened and then inserted a tan load Covidien and slowly fired the stapler and created the anastomosis.  There was no evidence of bleeding from this new anastomosis.  There were some old clots that I milked down from the immediate ileal region.  The common defect was closed from either end with 4-0 PDS in a running canal fashion and then an outer layer of seromuscular sutures of 3-0 silk.  There was a small mesenteric defect, which I elected this time not to  close as I had closed yesterday and the anastomosis seemed to lie very nicely the way it was.  No evidence of bleeding could be seen.  I reinserted everything into the abdomen.  I irrigated with saline leaving some saline in the abdomen.  I then closed the wound with #1 Novafil pop-offs and carefully palpated on the inside to make sure it getting thing pulled into the incision.  After further irrigation, this wound was stapled closed.  The patient tolerated the procedure well and was taken to the recovery room in satisfactory condition.     Isabel Caprice Hassell Done, MD     MBM/MEDQ  D:  01/31/2014  T:  02/01/2014  Job:  [id number]

## 2014-02-01 NOTE — Progress Notes (Signed)
Patient ID: April [name], female   DOB: 1925-08-04, 81 y.o.   MRN: [id number] [facility] Surgery Progress Note:   1 Day Post-Op  Subjective: Mental status is clear.  Slept better last night.   Objective: Vital signs in last 24 hours: Temp:  [97.4 F (36.3 C)-98.6 F (37 C)] 97.9 F (36.6 C) (01/07 0800) Pulse Rate:  [72-124] 75 (01/07 0600) Resp:  [10-26] 11 (01/07 0600) BP: (99-151)/(30-102) 103/40 mmHg (01/07 0600) SpO2:  [91 %-100 %] 98 % (01/07 0600)  Intake/Output from previous day: 01/06 0701 - 01/07 0700 In: 7050 [I.V.:5425; Blood:1385; IV Piggyback:50] Out: 540 [Urine:655; Blood:100] Intake/Output this shift:    Physical Exam: Work of breathing is normal.  Pain controlled.  No bloody BMs thus far.  Expect old blood per rectum when ileus resolves  Lab Results:  Results for orders placed or performed during the hospital encounter of 01/24/14 (from the past 48 hour(s))  CBC     Status: Abnormal   Collection Time: 01/30/14  9:01 AM  Result Value Ref Range   WBC 22.3 (H) 4.0 - 10.5 K/uL   RBC 3.68 (L) 3.87 - 5.11 MIL/uL   Hemoglobin 10.1 (L) 12.0 - 15.0 g/dL   HCT 31.4 (L) 36.0 - 46.0 %   MCV 85.3 78.0 - 100.0 fL   MCH 27.4 26.0 - 34.0 pg   MCHC 32.2 30.0 - 36.0 g/dL   RDW 17.5 (H) 11.5 - 15.5 %   Platelets 188 150 - 400 K/uL  CBC     Status: Abnormal   Collection Time: 01/30/14  3:25 PM  Result Value Ref Range   WBC 22.6 (H) 4.0 - 10.5 K/uL   RBC 2.94 (L) 3.87 - 5.11 MIL/uL   Hemoglobin 8.1 (L) 12.0 - 15.0 g/dL    Comment: DELTA CHECK NOTED   HCT 25.6 (L) 36.0 - 46.0 %   MCV 87.1 78.0 - 100.0 fL   MCH 27.6 26.0 - 34.0 pg   MCHC 31.6 30.0 - 36.0 g/dL   RDW 17.6 (H) 11.5 - 15.5 %   Platelets 173 150 - 400 K/uL  Creatinine, serum     Status: Abnormal   Collection Time: 01/30/14  3:25 PM  Result Value Ref Range   Creatinine, Ser 0.88 0.50 - 1.10 mg/dL   GFR calc non Af Amer 58 (L) >90 mL/min   GFR calc Af Amer 67 (L) >90 mL/min    Comment: (NOTE) The  eGFR has been calculated using the CKD EPI equation. This calculation has not been validated in all clinical situations. eGFR's persistently <90 mL/min signify possible Chronic Kidney Disease.   CBC     Status: Abnormal   Collection Time: 01/30/14  6:08 PM  Result Value Ref Range   WBC 32.5 (H) 4.0 - 10.5 K/uL    Comment: WHITE COUNT CONFIRMED ON SMEAR   RBC 2.51 (L) 3.87 - 5.11 MIL/uL   Hemoglobin 6.9 (LL) 12.0 - 15.0 g/dL    Comment: REPEATED TO VERIFY CRITICAL RESULT CALLED TO, READ BACK BY AND VERIFIED WITH: RUSSELL,S. RN @1846  ON 1.5.2016 BY MCCOY,N.    HCT 21.7 (L) 36.0 - 46.0 %   MCV 86.5 78.0 - 100.0 fL   MCH 27.5 26.0 - 34.0 pg   MCHC 31.8 30.0 - 36.0 g/dL   RDW 17.5 (H) 11.5 - 15.5 %   Platelets 184 150 - 400 K/uL    Comment: SPECIMEN CHECKED FOR CLOTS REPEATED TO VERIFY PLATELET COUNT CONFIRMED BY SMEAR  Prepare RBC     Status: None   Collection Time: 01/30/14  6:21 PM  Result Value Ref Range   Order Confirmation ORDER PROCESSED BY BLOOD BANK   Prepare RBC     Status: None   Collection Time: 01/30/14  8:00 PM  Result Value Ref Range   Order Confirmation ORDER PROCESSED BY BLOOD BANK   Prepare fresh frozen plasma     Status: None   Collection Time: 01/30/14  8:00 PM  Result Value Ref Range   Unit Number [id number]    Blood Component Type THAWED PLASMA    Unit division 00    Status of Unit ISSUED,FINAL    Transfusion Status OK TO TRANSFUSE    Unit Number [id number]    Blood Component Type THAWED PLASMA    Unit division 00    Status of Unit ISSUED,FINAL    Transfusion Status OK TO TRANSFUSE    Unit Number [id number]    Blood Component Type THAWED PLASMA    Unit division 00    Status of Unit ISSUED,FINAL    Transfusion Status OK TO TRANSFUSE   CBC with Differential     Status: Abnormal   Collection Time: 01/30/14 11:31 PM  Result Value Ref Range   WBC 30.0 (H) 4.0 - 10.5 K/uL    Comment: WHITE COUNT CONFIRMED ON SMEAR   RBC 3.62 (L) 3.87 -  5.11 MIL/uL   Hemoglobin 10.5 (L) 12.0 - 15.0 g/dL    Comment: DELTA CHECK NOTED QUANTITY NOT SUFFICIENT TO REPEAT TEST POST TRANSFUSION SPECIMEN    HCT 31.7 (L) 36.0 - 46.0 %   MCV 87.6 78.0 - 100.0 fL   MCH 29.0 26.0 - 34.0 pg   MCHC 33.1 30.0 - 36.0 g/dL   RDW 15.5 11.5 - 15.5 %   Platelets 83 (L) 150 - 400 K/uL    Comment: DELTA CHECK NOTED QUANTITY NOT SUFFICIENT TO REPEAT TEST SPECIMEN CHECKED FOR CLOTS PLATELET COUNT CONFIRMED BY SMEAR    Neutrophils Relative % 78 (H) 43 - 77 %   Lymphocytes Relative 4 (L) 12 - 46 %   Monocytes Relative 4 3 - 12 %   Eosinophils Relative 13 (H) 0 - 5 %   Basophils Relative 1 0 - 1 %   Neutro Abs 23.4 (H) 1.7 - 7.7 K/uL   Lymphs Abs 1.2 0.7 - 4.0 K/uL   Monocytes Absolute 1.2 (H) 0.1 - 1.0 K/uL   Eosinophils Absolute 3.9 (H) 0.0 - 0.7 K/uL   Basophils Absolute 0.3 (H) 0.0 - 0.1 K/uL   RBC Morphology POLYCHROMASIA PRESENT    WBC Morphology VACUOLATED NEUTROPHILS    Smear Review LARGE PLATELETS PRESENT   Basic metabolic panel     Status: Abnormal   Collection Time: 01/30/14 11:31 PM  Result Value Ref Range   Sodium 137 135 - 145 mmol/L    Comment: Please note change in reference range.   Potassium 4.4 3.5 - 5.1 mmol/L    Comment: Please note change in reference range.   Chloride 111 96 - 112 mEq/L   CO2 19 19 - 32 mmol/L   Glucose, Bld 257 (H) 70 - 99 mg/dL   BUN 10 6 - 23 mg/dL   Creatinine, Ser 0.90 0.50 - 1.10 mg/dL   Calcium 7.4 (L) 8.4 - 10.5 mg/dL   GFR calc non Af Amer 56 (L) >90 mL/min   GFR calc Af Amer 65 (L) >90 mL/min    Comment: (NOTE) The eGFR has  been calculated using the CKD EPI equation. This calculation has not been validated in all clinical situations. eGFR's persistently <90 mL/min signify possible Chronic Kidney Disease.    Anion gap 7 5 - 15  Prepare fresh frozen plasma     Status: None (Preliminary result)   Collection Time: 01/31/14  1:30 AM  Result Value Ref Range   Unit Number [id number]    Blood  Component Type THAWED PLASMA    Unit division 00    Status of Unit ISSUED,FINAL    Transfusion Status OK TO TRANSFUSE    Unit Number [id number]    Blood Component Type THAWED PLASMA    Unit division 00    Status of Unit ALLOCATED    Transfusion Status OK TO TRANSFUSE   Basic metabolic panel     Status: Abnormal   Collection Time: 01/31/14  3:40 AM  Result Value Ref Range   Sodium 137 135 - 145 mmol/L    Comment: Please note change in reference range.   Potassium 3.5 3.5 - 5.1 mmol/L    Comment: Please note change in reference range. DELTA CHECK NOTED    Chloride 109 96 - 112 mEq/L   CO2 21 19 - 32 mmol/L   Glucose, Bld 262 (H) 70 - 99 mg/dL   BUN 11 6 - 23 mg/dL   Creatinine, Ser 0.85 0.50 - 1.10 mg/dL   Calcium 7.3 (L) 8.4 - 10.5 mg/dL   GFR calc non Af Amer 60 (L) >90 mL/min   GFR calc Af Amer 70 (L) >90 mL/min    Comment: (NOTE) The eGFR has been calculated using the CKD EPI equation. This calculation has not been validated in all clinical situations. eGFR's persistently <90 mL/min signify possible Chronic Kidney Disease.    Anion gap 7 5 - 15  CBC     Status: Abnormal   Collection Time: 01/31/14  3:40 AM  Result Value Ref Range   WBC 30.2 (H) 4.0 - 10.5 K/uL    Comment: WHITE COUNT CONFIRMED ON SMEAR   RBC 2.69 (L) 3.87 - 5.11 MIL/uL   Hemoglobin 7.9 (L) 12.0 - 15.0 g/dL    Comment: DELTA CHECK NOTED REPEATED TO VERIFY    HCT 23.4 (L) 36.0 - 46.0 %   MCV 87.0 78.0 - 100.0 fL   MCH 29.4 26.0 - 34.0 pg   MCHC 33.8 30.0 - 36.0 g/dL   RDW 15.7 (H) 11.5 - 15.5 %   Platelets 116 (L) 150 - 400 K/uL    Comment: SPECIMEN CHECKED FOR CLOTS REPEATED TO VERIFY DELTA CHECK NOTED PLATELET COUNT CONFIRMED BY SMEAR   Prepare Pheresed Platelets     Status: None   Collection Time: 01/31/14  4:33 AM  Result Value Ref Range   Unit Number [id number]    Blood Component Type PLTPHER LR2    Unit division 00    Status of Unit ISSUED,FINAL    Transfusion Status OK TO  TRANSFUSE   Prepare RBC     Status: None   Collection Time: 01/31/14  7:00 AM  Result Value Ref Range   Order Confirmation ORDER PROCESSED BY BLOOD BANK   CBC     Status: Abnormal   Collection Time: 01/31/14  7:15 AM  Result Value Ref Range   WBC 39.1 (H) 4.0 - 10.5 K/uL    Comment: WHITE COUNT CONFIRMED ON SMEAR   RBC 1.92 (L) 3.87 - 5.11 MIL/uL   Hemoglobin 5.6 (LL) 12.0 - 15.0 g/dL  Comment: REPEATED TO VERIFY CRITICAL RESULT CALLED TO, READ BACK BY AND VERIFIED WITH: G. SHEPPHARD RN AT 0815 ON 01.06.15 BY SHUEA CORRECT DATE OF CALL 01/31/2014    HCT 16.4 (L) 36.0 - 46.0 %   MCV 85.4 78.0 - 100.0 fL   MCH 29.2 26.0 - 34.0 pg   MCHC 34.1 30.0 - 36.0 g/dL   RDW 15.7 (H) 11.5 - 15.5 %   Platelets 120 (L) 150 - 400 K/uL    Comment: SPECIMEN CHECKED FOR CLOTS REPEATED TO VERIFY PLATELET COUNT CONFIRMED BY SMEAR   Glucose, capillary     Status: Abnormal   Collection Time: 01/31/14  9:19 AM  Result Value Ref Range   Glucose-Capillary 205 (H) 70 - 99 mg/dL   Comment 1 Documented in Chart   Glucose, capillary     Status: Abnormal   Collection Time: 01/31/14 12:27 PM  Result Value Ref Range   Glucose-Capillary 164 (H) 70 - 99 mg/dL  Prepare RBC     Status: None   Collection Time: 01/31/14 12:30 PM  Result Value Ref Range   Order Confirmation ORDER PROCESSED BY BLOOD BANK   Glucose, capillary     Status: Abnormal   Collection Time: 01/31/14 12:55 PM  Result Value Ref Range   Glucose-Capillary 164 (H) 70 - 99 mg/dL  CBC     Status: Abnormal   Collection Time: 01/31/14  2:32 PM  Result Value Ref Range   WBC 31.9 (H) 4.0 - 10.5 K/uL    Comment: REPEATED TO VERIFY WHITE COUNT CONFIRMED ON SMEAR    RBC 3.89 3.87 - 5.11 MIL/uL   Hemoglobin 10.9 (L) 12.0 - 15.0 g/dL    Comment: REPEATED TO VERIFY POST TRANSFUSION SPECIMEN    HCT 32.8 (L) 36.0 - 46.0 %   MCV 84.3 78.0 - 100.0 fL   MCH 28.0 26.0 - 34.0 pg   MCHC 33.2 30.0 - 36.0 g/dL   RDW 15.4 11.5 - 15.5 %   Platelets  86 (L) 150 - 400 K/uL    Comment: DELTA CHECK NOTED REPEATED TO VERIFY SPECIMEN CHECKED FOR CLOTS PLATELET COUNT CONFIRMED BY SMEAR   APTT     Status: None   Collection Time: 01/31/14  2:32 PM  Result Value Ref Range   aPTT 24 24 - 37 seconds  Protime-INR     Status: Abnormal   Collection Time: 01/31/14  2:32 PM  Result Value Ref Range   Prothrombin Time 15.4 (H) 11.6 - 15.2 seconds   INR 1.21 0.00 - 8.14  Basic metabolic panel     Status: Abnormal   Collection Time: 02/01/14  4:40 AM  Result Value Ref Range   Sodium 140 135 - 145 mmol/L    Comment: Please note change in reference range.   Potassium 3.7 3.5 - 5.1 mmol/L    Comment: Please note change in reference range.   Chloride 113 (H) 96 - 112 mEq/L   CO2 25 19 - 32 mmol/L   Glucose, Bld 168 (H) 70 - 99 mg/dL   BUN 9 6 - 23 mg/dL   Creatinine, Ser 0.64 0.50 - 1.10 mg/dL   Calcium 7.1 (L) 8.4 - 10.5 mg/dL   GFR calc non Af Amer 79 (L) >90 mL/min   GFR calc Af Amer >90 >90 mL/min    Comment: (NOTE) The eGFR has been calculated using the CKD EPI equation. This calculation has not been validated in all clinical situations. eGFR's persistently <90 mL/min signify possible Chronic  Kidney Disease.    Anion gap 2 (L) 5 - 15    Comment: RESULT CHECKED  CBC     Status: Abnormal   Collection Time: 02/01/14  4:40 AM  Result Value Ref Range   WBC 32.3 (H) 4.0 - 10.5 K/uL   RBC 3.32 (L) 3.87 - 5.11 MIL/uL   Hemoglobin 9.4 (L) 12.0 - 15.0 g/dL   HCT 27.7 (L) 36.0 - 46.0 %   MCV 83.4 78.0 - 100.0 fL   MCH 28.3 26.0 - 34.0 pg   MCHC 33.9 30.0 - 36.0 g/dL   RDW 16.3 (H) 11.5 - 15.5 %   Platelets 102 (L) 150 - 400 K/uL    Comment: CONSISTENT WITH PREVIOUS RESULT    Radiology/Results: Port Chest Xray - Line Placement  01/31/2014   CLINICAL DATA:  Central line placement.  EXAM: PORTABLE CHEST - 1 VIEW SAME DAY  COMPARISON:  05/26/2009  FINDINGS: The patient is mildly rotated to the right. There has been interval placement of right  jugular central venous catheter tip projecting over the cavoatrial junction. Cardiac silhouette is within normal limits. The lungs are hypoinflated with mild elevation of the right hemidiaphragm. Mild bibasilar opacities are present. No definite pleural effusion or pneumothorax is identified. Right upper quadrant abdominal surgical clips are noted.  IMPRESSION: 1. Right jugular catheter tip near the cavoatrial junction. 2. Hypoinflation with bibasilar subsegmental atelectasis.   Electronically Signed   By: Logan Bores   On: 01/31/2014 13:35    Anti-infectives: Anti-infectives    Start     Dose/Rate Route Frequency Ordered Stop   01/30/14 1500  cefoTEtan (CEFOTAN) 2 g in dextrose 5 % 50 mL IVPB     2 g100 mL/hr over 30 Minutes Intravenous Every 12 hours 01/30/14 1344 01/30/14 1524   01/30/14 0900  cefoTEtan (CEFOTAN) 2 g in dextrose 5 % 50 mL IVPB     2 g100 mL/hr over 30 Minutes Intravenous On call to O.R. 01/29/14 1911 01/30/14 0807   01/29/14 2000  neomycin (MYCIFRADIN) tablet 1,000 mg     1,000 mg Oral Every 6 hours 01/29/14 1911 01/30/14 0737   01/29/14 2000  erythromycin (E-MYCIN) tablet 1,000 mg     1,000 mg Oral Every 6 hours 01/29/14 1911 01/30/14 0737   01/28/14 1700  cephALEXin (KEFLEX) capsule 500 mg  Status:  Discontinued     500 mg Oral 3 times per day 01/28/14 1526 01/29/14 1258   01/25/14 1356  ceFAZolin (ANCEF) 2-3 GM-% IVPB SOLR    Comments:  Margaretmary Dys   : cabinet override      01/25/14 1356 01/25/14 1402      Assessment/Plan: Problem List: Patient Active Problem List   Diagnosis Date Noted  . Colon cancer 01/30/2014  . Lap assisted right hemicolectomy Jan 2016 01/30/2014  . Colonic mass 01/29/2014  . Rectal bleeding   . Acute blood loss anemia 01/26/2014  . Colonic hemorrhage 01/24/2014  . Thickened endometrium 01/24/2014  . Senile osteoporosis   . Vertebral compression fracture 07/17/2012  . hx: breast cancer, DCIS, right, receptor + 11/28/2010    Stable  postop.  No evidence of recurrent bleeding.  Will continue observation with NPO except ice chips and popsicles.  1 Day Post-Op    LOS: 8 days   Matt B. Hassell Done, MD, [facility] [phone] beeper [phone]  02/01/2014 8:38 AM

## 2014-02-01 NOTE — Progress Notes (Signed)
[facility] PROGRESS NOTE  April [name] DSK:[id number] DOB: 09-02-1925 DOA: 01/24/2014 PCP: Tommy Medal, MD  Assessment/Plan: Hematochezia secondary to cancer of the ascending colon Pathology demonstrated adenocarcinoma of the colon. Biopsies from upper endoscopy revealed duodenitis wihtout malignancy. She underwent a nuclear scan initially which did identify an area of concern in the mid ascending colon. Angiogram was done however, could not identify the bleeding vessel. Her bleeding subsequently subsided. She underwent EGD and colonoscopy as mentioned above. A colonic mass was found in the proximal colon. Patient underwent Laparoscopic R hemicolectomy on 1/5. Large post-operative bleed noted (see below). Pt underwent ex-lap with resection of SB anastomosis and new ileocolostomy.  Leukocytosis with eosinophilia  WBC remains elevated, overall stable. Continue to monitor. Suspect due to the cancer. Pt is afebrile and is not toxic-appearing  Acute blood loss anemia  Large drop in hgb post-operatively. Pt was taken back to OR 01/31/14 and is now s/p ex-lap with resection of small bowel anastamosis and new ileocolostomy. Thus far hgb is holding stable  Thickened endometrium/fluid-filled uterus This was incidentally noted on the CT during this admission, as well as a previous CT scan done in September. Had a long discussion with the patient regarding the same. She denies any vaginal bleeding. Initially plan was for outpatient management, but considering a new finding of colonic mass. An ultrasound was done yesterday. It showed fluid-filled uterus. Was recently discussed with GYN, recommend outpatient management for the same. No need for urgent intervention..  E Coli on Urine Culture Not truly symptomatic, but was treated with Keflex. Keflex was stopped due to bowel prep for GI surgery. Can likely observe off of antibiotics for now.  Code Status: Full Family Communication: Pt in  room Disposition Plan: Pending   Consultants:  General surgery  Procedures:  Laparoscopic colectomy 01/30/14  Ex-lap with resection of SB anastamosis and new ileocolostomy 01/31/14  Antibiotics:  none  HPI/Subjective: Pt reports feeling better. No acute events noted  Objective: Filed Vitals:   02/01/14 1000 02/01/14 1100 02/01/14 1200 02/01/14 1300  BP: 116/45 109/41 111/33   Pulse: 86 85 86 97  Temp:   97.9 F (36.6 C)   TempSrc:   Oral   Resp: 19 25 19 28   Height:      Weight:      SpO2: 99% 95% 95% 92%    Intake/Output Summary (Last 24 hours) at 02/01/14 1342 Last data filed at 02/01/14 1300  Gross per 24 hour  Intake   2540 ml  Output   1165 ml  Net   1375 ml   Filed Weights   01/25/14 0100 01/30/14 1900  Weight: 68.5 kg (151 lb 0.2 oz) 72.2 kg (159 lb 2.8 oz)    Exam:   General:  Awake, in nad  Cardiovascular: regular, s1, s2  Respiratory: normal resp effort, no wheezing  Abdomen: soft, nondistended  Musculoskeletal: perfused, no clubbing   Data Reviewed: Basic Metabolic Panel:  Recent Labs Lab 01/29/14 0436 01/30/14 0455 01/30/14 1525 01/30/14 2331 01/31/14 0340 02/01/14 0440  NA 139 139  --  137 137 140  K 4.2 3.7  --  4.4 3.5 3.7  CL 107 106  --  111 109 113*  CO2 26 26  --  19 21 25   GLUCOSE 98 114*  --  257* 262* 168*  BUN 5* 5*  --  10 11 9   CREATININE 0.64 0.77 0.88 0.90 0.85 0.64  CALCIUM 8.7 8.8  --  7.4* 7.3* 7.1*  Liver Function Tests: No results for input(s): AST, ALT, ALKPHOS, BILITOT, PROT, ALBUMIN in the last 168 hours. No results for input(s): LIPASE, AMYLASE in the last 168 hours. No results for input(s): AMMONIA in the last 168 hours. CBC:  Recent Labs Lab 01/29/14 0436  01/30/14 2331 01/31/14 0340 01/31/14 0715 01/31/14 1432 02/01/14 0440  WBC 18.7*  < > 30.0* 30.2* 39.1* 31.9* 32.3*  NEUTROABS 10.3*  --  23.4*  --   --   --   --   HGB 9.4*  < > 10.5* 7.9* 5.6* 10.9* 9.4*  HCT 29.4*  < > 31.7* 23.4*  16.4* 32.8* 27.7*  MCV 86.7  < > 87.6 87.0 85.4 84.3 83.4  PLT 169  < > 83* 116* 120* 86* 102*  < > = values in this interval not displayed. Cardiac Enzymes: No results for input(s): CKTOTAL, CKMB, CKMBINDEX, TROPONINI in the last 168 hours. BNP (last 3 results) No results for input(s): PROBNP in the last 8760 hours. CBG:  Recent Labs Lab 01/31/14 0919 01/31/14 1227 01/31/14 1255  GLUCAP 205* 164* 164*    Recent Results (from the past 240 hour(s))  MRSA PCR Screening     Status: None   Collection Time: 01/25/14 12:59 AM  Result Value Ref Range Status   MRSA by PCR NEGATIVE NEGATIVE Final    Comment:        The GeneXpert MRSA Assay (FDA approved for NASAL specimens only), is one component of a comprehensive MRSA colonization surveillance program. It is not intended to diagnose MRSA infection nor to guide or monitor treatment for MRSA infections.   Urine culture     Status: None   Collection Time: 01/25/14  4:40 AM  Result Value Ref Range Status   Specimen Description URINE, CLEAN CATCH  Final   Special Requests NONE  Final   Colony Count   Final    >=100,000 COLONIES/ML Performed at [facility]    Culture   Final    ESCHERICHIA COLI Performed at [facility]    Report Status 01/28/2014 FINAL  Final   Organism ID, Bacteria ESCHERICHIA COLI  Final      Susceptibility   Escherichia coli - MIC*    AMPICILLIN 4 SENSITIVE Sensitive     CEFAZOLIN <=4 SENSITIVE Sensitive     CEFTRIAXONE <=1 SENSITIVE Sensitive     CIPROFLOXACIN <=0.25 SENSITIVE Sensitive     GENTAMICIN <=1 SENSITIVE Sensitive     LEVOFLOXACIN <=0.12 SENSITIVE Sensitive     NITROFURANTOIN <=16 SENSITIVE Sensitive     TOBRAMYCIN <=1 SENSITIVE Sensitive     TRIMETH/SULFA <=20 SENSITIVE Sensitive     PIP/TAZO <=4 SENSITIVE Sensitive     * ESCHERICHIA COLI  Surgical pcr screen     Status: Abnormal   Collection Time: 01/29/14  2:52 PM  Result Value Ref Range Status   MRSA, PCR  NEGATIVE NEGATIVE Final   Staphylococcus aureus POSITIVE (A) NEGATIVE Final    Comment:        The Xpert SA Assay (FDA approved for NASAL specimens in patients over 40 years of age), is one component of a comprehensive surveillance program.  Test performance has been validated by [employer] for patients greater than or equal to 84 year old. It is not intended to diagnose infection nor to guide or monitor treatment.      Studies: Port Chest Xray - Line Placement  01/31/2014   CLINICAL DATA:  Central line placement.  EXAM: PORTABLE CHEST -  1 VIEW SAME DAY  COMPARISON:  05/26/2009  FINDINGS: The patient is mildly rotated to the right. There has been interval placement of right jugular central venous catheter tip projecting over the cavoatrial junction. Cardiac silhouette is within normal limits. The lungs are hypoinflated with mild elevation of the right hemidiaphragm. Mild bibasilar opacities are present. No definite pleural effusion or pneumothorax is identified. Right upper quadrant abdominal surgical clips are noted.  IMPRESSION: 1. Right jugular catheter tip near the cavoatrial junction. 2. Hypoinflation with bibasilar subsegmental atelectasis.   Electronically Signed   By: Logan Bores   On: 01/31/2014 13:35    Scheduled Meds:  Continuous Infusions: . dextrose 5 % and 0.45 % NaCl with KCl 20 mEq/L 100 mL/hr at 02/01/14 1683    Principal Problem:   Colon cancer Active Problems:   hx: breast cancer, DCIS, right, receptor +   Colonic hemorrhage   Thickened endometrium   Acute blood loss anemia   Rectal bleeding   Colonic mass   Lap assisted right hemicolectomy Jan 2016  Time spent: 14min  Angelyse Heslin, [place] [facility] Pager [phone]. If 7PM-7AM, please contact night-coverage at [email or web address], password [facility] 02/01/2014, 1:42 PM  LOS: 8 days

## 2014-02-02 DIAGNOSIS — Z853 Personal history of malignant neoplasm of breast: Secondary | ICD-10-CM

## 2014-02-02 LAB — CBC
HCT: 31.7 % — ABNORMAL LOW (ref 36.0–46.0)
Hemoglobin: 10.6 g/dL — ABNORMAL LOW (ref 12.0–15.0)
MCH: 28.3 pg (ref 26.0–34.0)
MCHC: 33.4 g/dL (ref 30.0–36.0)
MCV: 84.5 fL (ref 78.0–100.0)
PLATELETS: 139 10*3/uL — AB (ref 150–400)
RBC: 3.75 MIL/uL — AB (ref 3.87–5.11)
RDW: 17 % — ABNORMAL HIGH (ref 11.5–15.5)
WBC: 28.7 10*3/uL — ABNORMAL HIGH (ref 4.0–10.5)

## 2014-02-02 LAB — PREPARE FRESH FROZEN PLASMA
UNIT DIVISION: 0
Unit division: 0

## 2014-02-02 NOTE — Progress Notes (Signed)
[facility] PROGRESS NOTE  April [name] [id number]:[id number] DOB: 12-Sep-1925 DOA: 01/24/2014 PCP: Tommy Medal, MD  Assessment/Plan: Hematochezia secondary to cancer of the ascending colon Pathology demonstrated adenocarcinoma of the colon. Biopsies from upper endoscopy revealed duodenitis wihtout malignancy. She underwent a nuclear scan initially which did identify an area of concern in the mid ascending colon. Angiogram was done however, could not identify the bleeding vessel. Her bleeding subsequently subsided. She underwent EGD and colonoscopy as mentioned above. A colonic mass was found in the proximal colon. Patient underwent Laparoscopic R hemicolectomy on 1/5. Large post-operative bleed noted (see below). Pt underwent ex-lap with resection of SB anastomosis and new ileocolostomy. Hgb remains stable overnight.  Leukocytosis with eosinophilia  WBC remains elevated, slightly lower today. Continue to monitor. Suspect due to the cancer. Pt is afebrile and is not toxic-appearing  Acute blood loss anemia  Large drop in hgb post-operatively. Pt was taken back to OR 01/31/14 and is now s/p ex-lap with resection of small bowel anastamosis and new ileocolostomy. Hgb so far holding stable  Thickened endometrium/fluid-filled uterus This was incidentally noted on the CT during this admission, as well as a previous CT scan done in September. Had a long discussion with the patient regarding the same. She denies any vaginal bleeding. Initially plan was for outpatient management, but considering a new finding of colonic mass. An ultrasound was done yesterday. It showed fluid-filled uterus. Was recently discussed with GYN, recommend outpatient management for the same. No need for urgent intervention..  E Coli on Urine Culture Not truly symptomatic, but was treated with Keflex. Keflex was stopped due to bowel prep for GI surgery. Can likely observe off of antibiotics for now.  Code Status: Full Family  Communication: Pt in room Disposition Plan: Pending   Consultants:  General surgery  Procedures:  Laparoscopic colectomy 01/30/14  Ex-lap with resection of SB anastamosis and new ileocolostomy 01/31/14  Antibiotics:  none  HPI/Subjective: No complaints. Does feel weak this AM  Objective: Filed Vitals:   02/02/14 0900 02/02/14 1000 02/02/14 1100 02/02/14 1200  BP: 133/57 128/45 124/42 135/69  Pulse: 100 94 99 99  Temp:    97.8 F (36.6 C)  TempSrc:    Oral  Resp: 20 14 16 16   Height:      Weight:      SpO2: 94% 94% 95% 97%    Intake/Output Summary (Last 24 hours) at 02/02/14 1501 Last data filed at 02/02/14 1400  Gross per 24 hour  Intake   2000 ml  Output   1125 ml  Net    875 ml   Filed Weights   01/25/14 0100 01/30/14 1900  Weight: 68.5 kg (151 lb 0.2 oz) 72.2 kg (159 lb 2.8 oz)    Exam:   General:  Awake, in nad  Cardiovascular: regular, s1, s2  Respiratory: normal resp effort, no wheezing  Abdomen: soft, nondistended  Musculoskeletal: perfused, no clubbing   Data Reviewed: Basic Metabolic Panel:  Recent Labs Lab 01/29/14 0436 01/30/14 0455 01/30/14 1525 01/30/14 2331 01/31/14 0340 02/01/14 0440  NA 139 139  --  137 137 140  K 4.2 3.7  --  4.4 3.5 3.7  CL 107 106  --  111 109 113*  CO2 26 26  --  19 21 25   GLUCOSE 98 114*  --  257* 262* 168*  BUN 5* 5*  --  10 11 9   CREATININE 0.64 0.77 0.88 0.90 0.85 0.64  CALCIUM 8.7 8.8  --  7.4*  7.3* 7.1*   Liver Function Tests: No results for input(s): AST, ALT, ALKPHOS, BILITOT, PROT, ALBUMIN in the last 168 hours. No results for input(s): LIPASE, AMYLASE in the last 168 hours. No results for input(s): AMMONIA in the last 168 hours. CBC:  Recent Labs Lab 01/29/14 0436  01/30/14 2331 01/31/14 0340 01/31/14 0715 01/31/14 1432 02/01/14 0440 02/02/14 0610  WBC 18.7*  < > 30.0* 30.2* 39.1* 31.9* 32.3* 28.7*  NEUTROABS 10.3*  --  23.4*  --   --   --   --   --   HGB 9.4*  < > 10.5* 7.9*  5.6* 10.9* 9.4* 10.6*  HCT 29.4*  < > 31.7* 23.4* 16.4* 32.8* 27.7* 31.7*  MCV 86.7  < > 87.6 87.0 85.4 84.3 83.4 84.5  PLT 169  < > 83* 116* 120* 86* 102* 139*  < > = values in this interval not displayed. Cardiac Enzymes: No results for input(s): CKTOTAL, CKMB, CKMBINDEX, TROPONINI in the last 168 hours. BNP (last 3 results) No results for input(s): PROBNP in the last 8760 hours. CBG:  Recent Labs Lab 01/31/14 0919 01/31/14 1227 01/31/14 1255  GLUCAP 205* 164* 164*    Recent Results (from the past 240 hour(s))  MRSA PCR Screening     Status: None   Collection Time: 01/25/14 12:59 AM  Result Value Ref Range Status   MRSA by PCR NEGATIVE NEGATIVE Final    Comment:        The GeneXpert MRSA Assay (FDA approved for NASAL specimens only), is one component of a comprehensive MRSA colonization surveillance program. It is not intended to diagnose MRSA infection nor to guide or monitor treatment for MRSA infections.   Urine culture     Status: None   Collection Time: 01/25/14  4:40 AM  Result Value Ref Range Status   Specimen Description URINE, CLEAN CATCH  Final   Special Requests NONE  Final   Colony Count   Final    >=100,000 COLONIES/ML Performed at [facility]    Culture   Final    ESCHERICHIA COLI Performed at [facility]    Report Status 01/28/2014 FINAL  Final   Organism ID, Bacteria ESCHERICHIA COLI  Final      Susceptibility   Escherichia coli - MIC*    AMPICILLIN 4 SENSITIVE Sensitive     CEFAZOLIN <=4 SENSITIVE Sensitive     CEFTRIAXONE <=1 SENSITIVE Sensitive     CIPROFLOXACIN <=0.25 SENSITIVE Sensitive     GENTAMICIN <=1 SENSITIVE Sensitive     LEVOFLOXACIN <=0.12 SENSITIVE Sensitive     NITROFURANTOIN <=16 SENSITIVE Sensitive     TOBRAMYCIN <=1 SENSITIVE Sensitive     TRIMETH/SULFA <=20 SENSITIVE Sensitive     PIP/TAZO <=4 SENSITIVE Sensitive     * ESCHERICHIA COLI  Surgical pcr screen     Status: Abnormal   Collection Time:  01/29/14  2:52 PM  Result Value Ref Range Status   MRSA, PCR NEGATIVE NEGATIVE Final   Staphylococcus aureus POSITIVE (A) NEGATIVE Final    Comment:        The Xpert SA Assay (FDA approved for NASAL specimens in patients over 59 years of age), is one component of a comprehensive surveillance program.  Test performance has been validated by [employer] for patients greater than or equal to 72 year old. It is not intended to diagnose infection nor to guide or monitor treatment.      Studies: No results found.  Scheduled Meds:  Continuous Infusions: . dextrose 5 % and 0.45 % NaCl with KCl 20 mEq/L 100 mL/hr at 02/02/14 0900    Principal Problem:   Colon cancer Active Problems:   hx: breast cancer, DCIS, right, receptor +   Colonic hemorrhage   Thickened endometrium   Acute blood loss anemia   Rectal bleeding   Colonic mass   Lap assisted right hemicolectomy Jan 2016  Time spent: 54min  Philopater Mucha, [facility] Pager [phone]. If 7PM-7AM, please contact night-coverage at [email or web address], password [facility] 02/02/2014, 3:01 PM  LOS: 9 days

## 2014-02-02 NOTE — Progress Notes (Signed)
2 Days Post-Op  Subjective: She feels bad and says she's voiding to much, also having Nausea.  Worst when she got up to bedside commode early this AM.  She has some abdominal distension this AM, and BS hyperactive.  Objective: Vital signs in last 24 hours: Temp:  [97.9 F (36.6 C)-98.3 F (36.8 C)] 98 F (36.7 C) (01/08 0335) Pulse Rate:  [81-101] 96 (01/08 0500) Resp:  [11-28] 21 (01/08 0600) BP: (99-161)/(29-63) 161/58 mmHg (01/08 0600) SpO2:  [92 %-99 %] 95 % (01/08 0600) Last BM Date: 01/31/14 Good urine output Afebrile, VSS WBC up, but better H/H is stable Intake/Output from previous day: 01/07 0701 - 01/08 0700 In: 2300 [I.V.:2300] Out: 1475 [Urine:1475] Intake/Output this shift: Total I/O In: -  Out: 125 [Urine:125]  General appearance: alert, cooperative and no distress Resp: clear to auscultation bilaterally and anterior GI: Mild distension, BS hyperactive, Waffle dressing is OK, port sites look good. Extremities: edema both lower legs.  Lab Results:   Recent Labs  02/01/14 0440 02/02/14 0610  WBC 32.3* 28.7*  HGB 9.4* 10.6*  HCT 27.7* 31.7*  PLT 102* 139*    BMET  Recent Labs  01/31/14 0340 02/01/14 0440  NA 137 140  K 3.5 3.7  CL 109 113*  CO2 21 25  GLUCOSE 262* 168*  BUN 11 9  CREATININE 0.85 0.64  CALCIUM 7.3* 7.1*   PT/INR  Recent Labs  01/31/14 1432  LABPROT 15.4*  INR 1.21    No results for input(s): AST, ALT, ALKPHOS, BILITOT, PROT, ALBUMIN in the last 168 hours.   Lipase     Component Value Date/Time   LIPASE 33 05/26/2009 0511     Studies/Results: Port Chest Xray - Line Placement  01/31/2014   CLINICAL DATA:  Central line placement.  EXAM: PORTABLE CHEST - 1 VIEW SAME DAY  COMPARISON:  05/26/2009  FINDINGS: The patient is mildly rotated to the right. There has been interval placement of right jugular central venous catheter tip projecting over the cavoatrial junction. Cardiac silhouette is within normal limits. The  lungs are hypoinflated with mild elevation of the right hemidiaphragm. Mild bibasilar opacities are present. No definite pleural effusion or pneumothorax is identified. Right upper quadrant abdominal surgical clips are noted.  IMPRESSION: 1. Right jugular catheter tip near the cavoatrial junction. 2. Hypoinflation with bibasilar subsegmental atelectasis.   Electronically Signed   By: Logan Bores   On: 01/31/2014 13:35    Medications:    Assessment/Plan 1. Right colon mass, likely adenocarcinoma, with recent bleeding INVASIVE ADENOCARCINOMA 2.  A)Laparoscopic assisted right hemicolectomy, 01/30/14, Dr. Johnathan Hausen,  Post op bleed requiring transfusion       B) EXPLORATORY LAPAROTOMY, RESECTION OF PRIOR SMALL BOWEL ANASTOMOSIS, UPPER ENDOSCOPY, CREATED A NEW ILEO COLOSTOMY  01/31/14  Dr. Hassell Done. 3. Acute blood loss anemia 4.  Post op ileus 5.  Possible fluid overload, I don't see a 2D echo or prior stress testing with EF. 4. Leukocytosis with eosinophilia 5. Hypertension 6. Breast cancer Right/DCIS/ER positive PR positive   Plan:  I think she is developing post op ileus, I would get her OOB more and start walking some.  I think with her age and fluid status we should keep her here, and monitored closely.  I will defer IV's/fluid management to Dr. Wyline Copas.  If her nausea persist we may need to place an NG.  I would hold off for now.  i have encouraged her to use her IS also.  I  have ordered CVP/daily weights, and orthostatic's so we can keep up with fluid.    LOS: 9 days    April [name] 02/02/2014

## 2014-02-03 ENCOUNTER — Encounter (HOSPITAL_COMMUNITY): Payer: Self-pay | Admitting: *Deleted

## 2014-02-03 DIAGNOSIS — Z9889 Other specified postprocedural states: Secondary | ICD-10-CM

## 2014-02-03 LAB — TYPE AND SCREEN
ABO/RH(D): B NEG
Antibody Screen: NEGATIVE
UNIT DIVISION: 0
UNIT DIVISION: 0
UNIT DIVISION: 0
UNIT DIVISION: 0
UNIT DIVISION: 0
Unit division: 0
Unit division: 0
Unit division: 0

## 2014-02-03 LAB — CBC
HEMATOCRIT: 31.1 % — AB (ref 36.0–46.0)
Hemoglobin: 10.2 g/dL — ABNORMAL LOW (ref 12.0–15.0)
MCH: 28.3 pg (ref 26.0–34.0)
MCHC: 32.8 g/dL (ref 30.0–36.0)
MCV: 86.1 fL (ref 78.0–100.0)
PLATELETS: 142 10*3/uL — AB (ref 150–400)
RBC: 3.61 MIL/uL — ABNORMAL LOW (ref 3.87–5.11)
RDW: 17.1 % — AB (ref 11.5–15.5)
WBC: 20.4 10*3/uL — ABNORMAL HIGH (ref 4.0–10.5)

## 2014-02-03 LAB — URINALYSIS, ROUTINE W REFLEX MICROSCOPIC
Bilirubin Urine: NEGATIVE
Glucose, UA: NEGATIVE mg/dL
Ketones, ur: NEGATIVE mg/dL
LEUKOCYTES UA: NEGATIVE
NITRITE: NEGATIVE
PH: 6 (ref 5.0–8.0)
Protein, ur: NEGATIVE mg/dL
SPECIFIC GRAVITY, URINE: 1.006 (ref 1.005–1.030)
Urobilinogen, UA: 0.2 mg/dL (ref 0.0–1.0)

## 2014-02-03 LAB — BASIC METABOLIC PANEL
Anion gap: 6 (ref 5–15)
BUN: <5 mg/dL — ABNORMAL LOW (ref 6–23)
CALCIUM: 8 mg/dL — AB (ref 8.4–10.5)
CO2: 28 mmol/L (ref 19–32)
Chloride: 107 mEq/L (ref 96–112)
Creatinine, Ser: 0.6 mg/dL (ref 0.50–1.10)
GFR calc Af Amer: >90 mL/min (ref >90)
GFR calc non Af Amer: 80 mL/min — ABNORMAL LOW (ref >90)
Glucose, Bld: 133 mg/dL — ABNORMAL HIGH (ref 70–99)
Potassium: 3.8 mmol/L (ref 3.5–5.1)
Sodium: 141 mmol/L (ref 135–145)

## 2014-02-03 LAB — URINE MICROSCOPIC-ADD ON

## 2014-02-03 MED ORDER — PROMETHAZINE HCL 25 MG/ML IJ SOLN
12.5000 mg | Freq: Four times a day (QID) | INTRAMUSCULAR | Status: DC | PRN
  Administered 2014-02-03 – 2014-02-07 (×5): 12.5 mg via INTRAVENOUS
  Filled 2014-02-03 (×5): qty 1

## 2014-02-03 MED ORDER — LORAZEPAM 2 MG/ML IJ SOLN
0.5000 mg | Freq: Once | INTRAMUSCULAR | Status: AC
  Administered 2014-02-03: 0.5 mg via INTRAVENOUS
  Filled 2014-02-03: qty 1

## 2014-02-03 NOTE — Progress Notes (Signed)
3 Days Post-Op  Subjective: Complains mostly of back pain  Objective: Vital signs in last 24 hours: Temp:  [97.6 F (36.4 C)-98.3 F (36.8 C)] 98.1 F (36.7 C) (01/09 0800) Pulse Rate:  [90-116] 96 (01/09 0600) Resp:  [13-30] 22 (01/09 0600) BP: (118-140)/(42-69) 135/46 mmHg (01/09 0600) SpO2:  [85 %-100 %] 100 % (01/09 0600) Weight:  [160 lb 15 oz (73 kg)] 160 lb 15 oz (73 kg) (01/09 0400) Last BM Date: 02/02/14  Intake/Output from previous day: 01/08 0701 - 01/09 0700 In: 2460 [P.O.:60; I.V.:2400] Out: 2775 [Urine:2775] Intake/Output this shift:    Resp: clear to auscultation bilaterally Cardio: regular rate and rhythm GI: soft, distended. minimal tenderness. good bs  Lab Results:   Recent Labs  02/02/14 0610 02/03/14 0342  WBC 28.7* 20.4*  HGB 10.6* 10.2*  HCT 31.7* 31.1*  PLT 139* 142*   BMET  Recent Labs  02/01/14 0440 02/03/14 0342  NA 140 141  K 3.7 3.8  CL 113* 107  CO2 25 28  GLUCOSE 168* 133*  BUN 9 <5*  CREATININE 0.64 0.60  CALCIUM 7.1* 8.0*   PT/INR  Recent Labs  01/31/14 1432  LABPROT 15.4*  INR 1.21   ABG No results for input(s): PHART, HCO3 in the last 72 hours.  Invalid input(s): PCO2, PO2  Studies/Results: No results found.  Anti-infectives: Anti-infectives    Start     Dose/Rate Route Frequency Ordered Stop   01/30/14 1500  cefoTEtan (CEFOTAN) 2 g in dextrose 5 % 50 mL IVPB     2 g100 mL/hr over 30 Minutes Intravenous Every 12 hours 01/30/14 1344 01/30/14 1524   01/30/14 0900  cefoTEtan (CEFOTAN) 2 g in dextrose 5 % 50 mL IVPB     2 g100 mL/hr over 30 Minutes Intravenous On call to O.R. 01/29/14 1911 01/30/14 0807   01/29/14 2000  neomycin (MYCIFRADIN) tablet 1,000 mg     1,000 mg Oral Every 6 hours 01/29/14 1911 01/30/14 0737   01/29/14 2000  erythromycin (E-MYCIN) tablet 1,000 mg     1,000 mg Oral Every 6 hours 01/29/14 1911 01/30/14 0737   01/28/14 1700  cephALEXin (KEFLEX) capsule 500 mg  Status:  Discontinued      500 mg Oral 3 times per day 01/28/14 1526 01/29/14 1258   01/25/14 1356  ceFAZolin (ANCEF) 2-3 GM-% IVPB SOLR    Comments:  Margaretmary Dys   : cabinet override      01/25/14 1356 01/25/14 1402      Assessment/Plan: s/p Procedure(s): EXPLORATORY LAPAROTOMY, RESECTION OF PRIOR SMALL BOWEL ANASTOMOSIS, UPPER ENDOSCOPY, CREATED A  NEW ILEO COLOSTOMY (N/A) stay with ice chips until abdomen less distended  Hg stable ambulate  LOS: 10 days    TOTH III,Kristianna Saperstein S 02/03/2014

## 2014-02-03 NOTE — Progress Notes (Signed)
Pt c/o nausea, pt was given zofran with no relief.

## 2014-02-03 NOTE — Progress Notes (Signed)
Pt having urinary frequency and urgency.  States she feels like she has UTI. [facility] NP notified and order obtained. Sample sent to lab.  Bladder scan performed after pt voided on BSC and 50-80cc urine appeared on scan.  Pt not in distress or uncomfortable.  Will report findings to day shift RN and MD if rounds made early.  Will continue to monitor.

## 2014-02-03 NOTE — Progress Notes (Signed)
[facility] PROGRESS NOTE  Flordia Kassem Casalino AOZ:[id number] DOB: [birth year] DOA: 01/24/2014 PCP: Tommy Medal, MD  Assessment/Plan: Hematochezia secondary to cancer of the ascending colon Pathology demonstrated adenocarcinoma of the colon. Biopsies from upper endoscopy revealed duodenitis wihtout malignancy. She underwent a nuclear scan initially which did identify an area of concern in the mid ascending colon. Angiogram was done however, could not identify the bleeding vessel. Her bleeding subsequently subsided. She underwent EGD and colonoscopy as mentioned above. A colonic mass was found in the proximal colon. Patient underwent Laparoscopic R hemicolectomy on 1/5. Large post-operative bleed noted (see below). Pt underwent ex-lap with resection of SB anastomosis and new ileocolostomy. Hgb remains stable overnight. Case was discussed with surgical team, who will continue to manage as primary service. Possible transfer to floor soon.  Leukocytosis with eosinophilia  WBC remains elevated, trending down. Continue to monitor. Suspect due to the cancer. Pt is afebrile and is not toxic-appearing  Acute blood loss anemia  Large drop in hgb post-operatively. Pt was taken back to OR 01/31/14 and is now s/p ex-lap with resection of small bowel anastamosis and new ileocolostomy. Hgb so far holding stable  Thickened endometrium/fluid-filled uterus This was incidentally noted on the CT during this admission, as well as a previous CT scan done in September. Had a long discussion with the patient regarding the same. She denies any vaginal bleeding. Initially plan was for outpatient management, but considering a new finding of colonic mass. An ultrasound was done yesterday. It showed fluid-filled uterus. Was recently discussed with GYN, recommend outpatient management for the same. No need for urgent intervention..  E Coli on Urine Culture Not truly symptomatic, but was treated with Keflex. Keflex was stopped  due to bowel prep for GI surgery. Can likely observe off of antibiotics for now.  Code Status: Full Family Communication: Pt in room Disposition Plan: Pending   Procedures:  Laparoscopic colectomy 01/30/14  Ex-lap with resection of SB anastamosis and new ileocolostomy 01/31/14  Antibiotics:  none  HPI/Subjective: Feels better today. No complaints.  Objective: Filed Vitals:   02/03/14 0800 02/03/14 1000 02/03/14 1200 02/03/14 1407  BP: 138/64 133/40 130/45 130/65  Pulse: 97 102 108 100  Temp: 98.1 F (36.7 C)  98.7 F (37.1 C)   TempSrc: Oral  Oral   Resp: 19 13 22    Height:      Weight:      SpO2: 96% 93% 97% 97%    Intake/Output Summary (Last 24 hours) at 02/03/14 1652 Last data filed at 02/03/14 1400  Gross per 24 hour  Intake 2042.5 ml  Output   2250 ml  Net -207.5 ml   Filed Weights   01/25/14 0100 01/30/14 1900 02/03/14 0400  Weight: 68.5 kg (151 lb 0.2 oz) 72.2 kg (159 lb 2.8 oz) 73 kg (160 lb 15 oz)    Exam:   General:  Awake, in nad  Cardiovascular: regular, s1, s2  Respiratory: normal resp effort, no wheezing  Abdomen: soft, nondistended  Musculoskeletal: perfused, no clubbing   Data Reviewed: Basic Metabolic Panel:  Recent Labs Lab 01/30/14 0455 01/30/14 1525 01/30/14 2331 01/31/14 0340 02/01/14 0440 02/03/14 0342  NA 139  --  137 137 140 141  K 3.7  --  4.4 3.5 3.7 3.8  CL 106  --  111 109 113* 107  CO2 26  --  19 21 25 28   GLUCOSE 114*  --  257* 262* 168* 133*  BUN 5*  --  10 11 9  <  5*  CREATININE 0.77 0.88 0.90 0.85 0.64 0.60  CALCIUM 8.8  --  7.4* 7.3* 7.1* 8.0*   Liver Function Tests: No results for input(s): AST, ALT, ALKPHOS, BILITOT, PROT, ALBUMIN in the last 168 hours. No results for input(s): LIPASE, AMYLASE in the last 168 hours. No results for input(s): AMMONIA in the last 168 hours. CBC:  Recent Labs Lab 01/29/14 0436  01/30/14 2331  01/31/14 0715 01/31/14 1432 02/01/14 0440 02/02/14 0610 02/03/14 0342   WBC 18.7*  < > 30.0*  < > 39.1* 31.9* 32.3* 28.7* 20.4*  NEUTROABS 10.3*  --  23.4*  --   --   --   --   --   --   HGB 9.4*  < > 10.5*  < > 5.6* 10.9* 9.4* 10.6* 10.2*  HCT 29.4*  < > 31.7*  < > 16.4* 32.8* 27.7* 31.7* 31.1*  MCV 86.7  < > 87.6  < > 85.4 84.3 83.4 84.5 86.1  PLT 169  < > 83*  < > 120* 86* 102* 139* 142*  < > = values in this interval not displayed. Cardiac Enzymes: No results for input(s): CKTOTAL, CKMB, CKMBINDEX, TROPONINI in the last 168 hours. BNP (last 3 results) No results for input(s): PROBNP in the last 8760 hours. CBG:  Recent Labs Lab 01/31/14 0919 01/31/14 1227 01/31/14 1255  GLUCAP 205* 164* 164*    Recent Results (from the past 240 hour(s))  MRSA PCR Screening     Status: None   Collection Time: 01/25/14 12:59 AM  Result Value Ref Range Status   MRSA by PCR NEGATIVE NEGATIVE Final    Comment:        The GeneXpert MRSA Assay (FDA approved for NASAL specimens only), is one component of a comprehensive MRSA colonization surveillance program. It is not intended to diagnose MRSA infection nor to guide or monitor treatment for MRSA infections.   Urine culture     Status: None   Collection Time: 01/25/14  4:40 AM  Result Value Ref Range Status   Specimen Description URINE, CLEAN CATCH  Final   Special Requests NONE  Final   Colony Count   Final    >=100,000 COLONIES/ML Performed at [facility]    Culture   Final    ESCHERICHIA COLI Performed at [facility]    Report Status 01/28/2014 FINAL  Final   Organism ID, Bacteria ESCHERICHIA COLI  Final      Susceptibility   Escherichia coli - MIC*    AMPICILLIN 4 SENSITIVE Sensitive     CEFAZOLIN <=4 SENSITIVE Sensitive     CEFTRIAXONE <=1 SENSITIVE Sensitive     CIPROFLOXACIN <=0.25 SENSITIVE Sensitive     GENTAMICIN <=1 SENSITIVE Sensitive     LEVOFLOXACIN <=0.12 SENSITIVE Sensitive     NITROFURANTOIN <=16 SENSITIVE Sensitive     TOBRAMYCIN <=1 SENSITIVE Sensitive      TRIMETH/SULFA <=20 SENSITIVE Sensitive     PIP/TAZO <=4 SENSITIVE Sensitive     * ESCHERICHIA COLI  Surgical pcr screen     Status: Abnormal   Collection Time: 01/29/14  2:52 PM  Result Value Ref Range Status   MRSA, PCR NEGATIVE NEGATIVE Final   Staphylococcus aureus POSITIVE (A) NEGATIVE Final    Comment:        The Xpert SA Assay (FDA approved for NASAL specimens in patients over 88 years of age), is one component of a comprehensive surveillance program.  Test performance has been validated by [employer]  for patients greater than or equal to 64 year old. It is not intended to diagnose infection nor to guide or monitor treatment.      Studies: No results found.  Scheduled Meds:  Continuous Infusions: . dextrose 5 % and 0.45 % NaCl with KCl 20 mEq/L 50 mL/hr at 02/03/14 5456    Principal Problem:   Colon cancer Active Problems:   hx: breast cancer, DCIS, right, receptor +   Colonic hemorrhage   Thickened endometrium   Acute blood loss anemia   Rectal bleeding   Colonic mass   Lap assisted right hemicolectomy Jan 2016  Time spent: 52min  Murrell Elizondo, [place] [facility] Pager [phone]. If 7PM-7AM, please contact night-coverage at [email or web address], password [facility] 02/03/2014, 4:52 PM  LOS: 10 days

## 2014-02-04 LAB — CBC WITH DIFFERENTIAL/PLATELET
Basophils Absolute: 0 10*3/uL (ref 0.0–0.1)
Basophils Relative: 0 % (ref 0–1)
EOS PCT: 32 % — AB (ref 0–5)
Eosinophils Absolute: 8 10*3/uL — ABNORMAL HIGH (ref 0.0–0.7)
HEMATOCRIT: 34.4 % — AB (ref 36.0–46.0)
HEMOGLOBIN: 11 g/dL — AB (ref 12.0–15.0)
LYMPHS ABS: 2 10*3/uL (ref 0.7–4.0)
Lymphocytes Relative: 8 % — ABNORMAL LOW (ref 12–46)
MCH: 27.8 pg (ref 26.0–34.0)
MCHC: 32 g/dL (ref 30.0–36.0)
MCV: 86.9 fL (ref 78.0–100.0)
Monocytes Absolute: 1.5 10*3/uL — ABNORMAL HIGH (ref 0.1–1.0)
Monocytes Relative: 6 % (ref 3–12)
Neutro Abs: 13.6 10*3/uL — ABNORMAL HIGH (ref 1.7–7.7)
Neutrophils Relative %: 54 % (ref 43–77)
Platelets: 155 10*3/uL (ref 150–400)
RBC: 3.96 MIL/uL (ref 3.87–5.11)
RDW: 16.9 % — ABNORMAL HIGH (ref 11.5–15.5)
WBC: 25.1 10*3/uL — ABNORMAL HIGH (ref 4.0–10.5)

## 2014-02-04 LAB — BASIC METABOLIC PANEL
Anion gap: 7 (ref 5–15)
BUN: <5 mg/dL — ABNORMAL LOW (ref 6–23)
CO2: 29 mmol/L (ref 19–32)
CREATININE: 0.59 mg/dL (ref 0.50–1.10)
Calcium: 8.3 mg/dL — ABNORMAL LOW (ref 8.4–10.5)
Chloride: 102 mEq/L (ref 96–112)
GFR calc non Af Amer: 81 mL/min — ABNORMAL LOW (ref >90)
GLUCOSE: 132 mg/dL — AB (ref 70–99)
POTASSIUM: 3.5 mmol/L (ref 3.5–5.1)
SODIUM: 138 mmol/L (ref 135–145)

## 2014-02-04 MED ORDER — ONDANSETRON HCL 4 MG/2ML IJ SOLN
4.0000 mg | INTRAMUSCULAR | Status: DC | PRN
  Administered 2014-02-06 – 2014-02-07 (×4): 4 mg via INTRAVENOUS
  Filled 2014-02-04 (×4): qty 2

## 2014-02-04 MED ORDER — METOCLOPRAMIDE HCL 5 MG/ML IJ SOLN
5.0000 mg | Freq: Three times a day (TID) | INTRAMUSCULAR | Status: DC
  Administered 2014-02-04 – 2014-02-05 (×4): 5 mg via INTRAVENOUS
  Filled 2014-02-04 (×4): qty 2

## 2014-02-04 NOTE — Progress Notes (Signed)
Pt with increased confusion and agitation. RN in room with pt to keep comfortable and reassure. Pt now w/o nausea but confused will continue to monitor.

## 2014-02-04 NOTE — Progress Notes (Signed)
[facility] PROGRESS NOTE  April [name] QIO:[id number] DOB: 09-10-1925 DOA: 01/24/2014 PCP: Tommy Medal, MD  Assessment/Plan: Hematochezia secondary to cancer of the ascending colon Pathology demonstrated adenocarcinoma of the colon. Biopsies from upper endoscopy revealed duodenitis wihtout malignancy. She underwent a nuclear scan initially which did identify an area of concern in the mid ascending colon. Angiogram was done however, could not identify the bleeding vessel. Her bleeding subsequently subsided. She underwent EGD and colonoscopy which identified a colonic mass in the proximal colon. Patient underwent Laparoscopic R hemicolectomy on 1/5. Large post-operative bleed noted (see below). Pt then underwent ex-lap with resection of SB anastomosis and new ileocolostomy. Hgb has since remained stable.  Leukocytosis with eosinophilia  WBC trending down. Continue to monitor. Suspect due to the cancer. Pt is afebrile and is not toxic-appearing  Acute blood loss anemia  Large drop in hgb post-operatively. Pt was taken back to OR 01/31/14 and is now s/p ex-lap with resection of small bowel anastamosis and new ileocolostomy. Hgb so far holding stable  Thickened endometrium/fluid-filled uterus This was incidentally noted on the CT during this admission, as well as a previous CT scan done in September. Had a long discussion with the patient regarding the same. She denies any vaginal bleeding. Initially plan was for outpatient management, but considering a new finding of colonic mass. An ultrasound was done yesterday. It showed fluid-filled uterus. Was recently discussed with GYN, recommend outpatient management for the same. No need for urgent intervention..  E Coli on Urine Culture Not truly symptomatic, but was treated with Keflex. Keflex was stopped due to bowel prep for GI surgery. Can likely observe off of antibiotics for now.  Code Status: Full Family Communication: Pt in  room Disposition Plan: Pending   Procedures:  Laparoscopic colectomy 01/30/14  Ex-lap with resection of SB anastamosis and new ileocolostomy 01/31/14  Antibiotics:  none  HPI/Subjective: Vomited overnight. Reports passing flatus. Also noted to be confused after receiving ativan overnight..  Objective: Filed Vitals:   02/04/14 1200 02/04/14 1300 02/04/14 1400 02/04/14 1500  BP: 129/62  122/36   Pulse: 101 110 111 100  Temp: 97.9 F (36.6 C)     TempSrc: Oral     Resp: 22 23 24 16   Height:      Weight:      SpO2: 95% 96% 98% 95%    Intake/Output Summary (Last 24 hours) at 02/04/14 1532 Last data filed at 02/04/14 1500  Gross per 24 hour  Intake   1300 ml  Output   1075 ml  Net    225 ml   Filed Weights   01/30/14 1900 02/03/14 0400 02/04/14 0500  Weight: 72.2 kg (159 lb 2.8 oz) 73 kg (160 lb 15 oz) 72 kg (158 lb 11.7 oz)    Exam:   General:  Awake, in nad  Cardiovascular: regular, s1, s2  Respiratory: normal resp effort, no wheezing  Abdomen: soft, nondistended  Musculoskeletal: perfused, no clubbing   Data Reviewed: Basic Metabolic Panel:  Recent Labs Lab 01/30/14 2331 01/31/14 0340 02/01/14 0440 02/03/14 0342 02/04/14 0932  NA 137 137 140 141 138  K 4.4 3.5 3.7 3.8 3.5  CL 111 109 113* 107 102  CO2 19 21 25 28 29   GLUCOSE 257* 262* 168* 133* 132*  BUN 10 11 9  <5* <5*  CREATININE 0.90 0.85 0.64 0.60 0.59  CALCIUM 7.4* 7.3* 7.1* 8.0* 8.3*   Liver Function Tests: No results for input(s): AST, ALT, ALKPHOS, BILITOT, PROT, ALBUMIN in  the last 168 hours. No results for input(s): LIPASE, AMYLASE in the last 168 hours. No results for input(s): AMMONIA in the last 168 hours. CBC:  Recent Labs Lab 01/29/14 0436  01/30/14 2331  01/31/14 1432 02/01/14 0440 02/02/14 0610 02/03/14 0342 02/04/14 0932  WBC 18.7*  < > 30.0*  < > 31.9* 32.3* 28.7* 20.4* 25.1*  NEUTROABS 10.3*  --  23.4*  --   --   --   --   --  13.6*  HGB 9.4*  < > 10.5*  < >  10.9* 9.4* 10.6* 10.2* 11.0*  HCT 29.4*  < > 31.7*  < > 32.8* 27.7* 31.7* 31.1* 34.4*  MCV 86.7  < > 87.6  < > 84.3 83.4 84.5 86.1 86.9  PLT 169  < > 83*  < > 86* 102* 139* 142* 155  < > = values in this interval not displayed. Cardiac Enzymes: No results for input(s): CKTOTAL, CKMB, CKMBINDEX, TROPONINI in the last 168 hours. BNP (last 3 results) No results for input(s): PROBNP in the last 8760 hours. CBG:  Recent Labs Lab 01/31/14 0919 01/31/14 1227 01/31/14 1255  GLUCAP 205* 164* 164*    Recent Results (from the past 240 hour(s))  Surgical pcr screen     Status: Abnormal   Collection Time: 01/29/14  2:52 PM  Result Value Ref Range Status   MRSA, PCR NEGATIVE NEGATIVE Final   Staphylococcus aureus POSITIVE (A) NEGATIVE Final    Comment:        The Xpert SA Assay (FDA approved for NASAL specimens in patients over 13 years of age), is one component of a comprehensive surveillance program.  Test performance has been validated by [employer] for patients greater than or equal to 79 year old. It is not intended to diagnose infection nor to guide or monitor treatment.      Studies: No results found.  Scheduled Meds: . metoCLOPramide (REGLAN) injection  5 mg Intravenous 3 times per day   Continuous Infusions: . dextrose 5 % and 0.45 % NaCl with KCl 20 mEq/L 50 mL/hr at 02/04/14 0446    Principal Problem:   Colon cancer Active Problems:   hx: breast cancer, DCIS, right, receptor +   Colonic hemorrhage   Thickened endometrium   Acute blood loss anemia   Rectal bleeding   Colonic mass   Lap assisted right hemicolectomy Jan 2016  Time spent: 21min  Lizmary Nader, [place] [facility] Pager [phone]. If 7PM-7AM, please contact night-coverage at [email or web address], password [facility] 02/04/2014, 3:32 PM  LOS: 11 days

## 2014-02-04 NOTE — Progress Notes (Signed)
4 Days Post-Op  Subjective: Vomited once during the night. Not clear if she could have aspirated or not. Received ativan and got very confused.  Objective: Vital signs in last 24 hours: Temp:  [97.9 F (36.6 C)-98.7 F (37.1 C)] 97.9 F (36.6 C) (01/10 0400) Pulse Rate:  [96-117] 107 (01/10 0600) Resp:  [13-23] 15 (01/10 0600) BP: (113-140)/(38-65) 113/47 mmHg (01/10 0600) SpO2:  [90 %-97 %] 96 % (01/10 0600) Weight:  [158 lb 11.7 oz (72 kg)] 158 lb 11.7 oz (72 kg) (01/10 0500) Last BM Date: 02/03/14  Intake/Output from previous day: 01/09 0701 - 01/10 0700 In: 1332.5 [P.O.:60; I.V.:1272.5] Out: 1325 [Urine:1200; Stool:125] Intake/Output this shift:    Resp: clear to auscultation bilaterally Cardio: regular rate and rhythm GI: soft, minimal tenderness. few bs. still distended  Lab Results:   Recent Labs  02/02/14 0610 02/03/14 0342  WBC 28.7* 20.4*  HGB 10.6* 10.2*  HCT 31.7* 31.1*  PLT 139* 142*   BMET  Recent Labs  02/03/14 0342  NA 141  K 3.8  CL 107  CO2 28  GLUCOSE 133*  BUN <5*  CREATININE 0.60  CALCIUM 8.0*   PT/INR No results for input(s): LABPROT, INR in the last 72 hours. ABG No results for input(s): PHART, HCO3 in the last 72 hours.  Invalid input(s): PCO2, PO2  Studies/Results: No results found.  Anti-infectives: Anti-infectives    Start     Dose/Rate Route Frequency Ordered Stop   01/30/14 1500  cefoTEtan (CEFOTAN) 2 g in dextrose 5 % 50 mL IVPB     2 g100 mL/hr over 30 Minutes Intravenous Every 12 hours 01/30/14 1344 01/30/14 1524   01/30/14 0900  cefoTEtan (CEFOTAN) 2 g in dextrose 5 % 50 mL IVPB     2 g100 mL/hr over 30 Minutes Intravenous On call to O.R. 01/29/14 1911 01/30/14 0807   01/29/14 2000  neomycin (MYCIFRADIN) tablet 1,000 mg     1,000 mg Oral Every 6 hours 01/29/14 1911 01/30/14 0737   01/29/14 2000  erythromycin (E-MYCIN) tablet 1,000 mg     1,000 mg Oral Every 6 hours 01/29/14 1911 01/30/14 0737   01/28/14 1700   cephALEXin (KEFLEX) capsule 500 mg  Status:  Discontinued     500 mg Oral 3 times per day 01/28/14 1526 01/29/14 1258   01/25/14 1356  ceFAZolin (ANCEF) 2-3 GM-% IVPB SOLR    Comments:  Margaretmary Dys   : cabinet override      01/25/14 1356 01/25/14 1402      Assessment/Plan: s/p Procedure(s): EXPLORATORY LAPAROTOMY, RESECTION OF PRIOR SMALL BOWEL ANASTOMOSIS, UPPER ENDOSCOPY, CREATED A  NEW ILEO COLOSTOMY (N/A) continue ice chips for now  Add reglan for ileus Check hg given recent gi bleed Continue to monitor in stepdown for now  LOS: 11 days    TOTH III,Kialee Kham S 02/04/2014

## 2014-02-04 NOTE — Progress Notes (Signed)
Pt c/o nausea midlevel called and stated to give pt ativan.

## 2014-02-05 LAB — CBC
HCT: 33.1 % — ABNORMAL LOW (ref 36.0–46.0)
HEMOGLOBIN: 10.5 g/dL — AB (ref 12.0–15.0)
MCH: 27.8 pg (ref 26.0–34.0)
MCHC: 31.7 g/dL (ref 30.0–36.0)
MCV: 87.6 fL (ref 78.0–100.0)
Platelets: 169 10*3/uL (ref 150–400)
RBC: 3.78 MIL/uL — AB (ref 3.87–5.11)
RDW: 17 % — ABNORMAL HIGH (ref 11.5–15.5)
WBC: 22.7 10*3/uL — AB (ref 4.0–10.5)

## 2014-02-05 LAB — CLOSTRIDIUM DIFFICILE BY PCR: Toxigenic C. Difficile by PCR: NEGATIVE

## 2014-02-05 MED ORDER — BISMUTH SUBSALICYLATE 262 MG/15ML PO SUSP
30.0000 mL | Freq: Two times a day (BID) | ORAL | Status: DC
  Administered 2014-02-05 – 2014-02-06 (×2): 30 mL via ORAL
  Filled 2014-02-05: qty 236

## 2014-02-05 MED ORDER — PHENOL 1.4 % MT LIQD: 2.0000 | OROMUCOSAL | Status: DC | PRN

## 2014-02-05 MED ORDER — FENTANYL CITRATE 0.05 MG/ML IJ SOLN
25.0000 ug | INTRAMUSCULAR | Status: DC | PRN
Start: 2014-02-05 — End: 2014-02-08

## 2014-02-05 MED ORDER — SACCHAROMYCES BOULARDII 250 MG PO CAPS
250.0000 mg | ORAL_CAPSULE | Freq: Two times a day (BID) | ORAL | Status: DC
  Administered 2014-02-05 – 2014-02-09 (×8): 250 mg via ORAL
  Filled 2014-02-05 (×9): qty 1

## 2014-02-05 MED ORDER — ALUM & MAG HYDROXIDE-SIMETH 200-200-20 MG/5ML PO SUSP: 30.0000 mL | Freq: Four times a day (QID) | ORAL | Status: DC | PRN

## 2014-02-05 MED ORDER — LIP MEDEX EX OINT
1.0000 "application " | TOPICAL_OINTMENT | Freq: Two times a day (BID) | CUTANEOUS | Status: DC
  Administered 2014-02-05 – 2014-02-08 (×6): 1 via TOPICAL
  Filled 2014-02-05: qty 7

## 2014-02-05 MED ORDER — MENTHOL 3 MG MT LOZG: 1.0000 | LOZENGE | OROMUCOSAL | Status: DC | PRN

## 2014-02-05 MED ORDER — ACETAMINOPHEN 500 MG PO TABS
1000.0000 mg | ORAL_TABLET | Freq: Three times a day (TID) | ORAL | Status: DC
  Administered 2014-02-05 – 2014-02-09 (×11): 1000 mg via ORAL
  Filled 2014-02-05 (×14): qty 2

## 2014-02-05 MED ORDER — MAGIC MOUTHWASH
15.0000 mL | Freq: Four times a day (QID) | ORAL | Status: DC | PRN
  Filled 2014-02-05: qty 15

## 2014-02-05 NOTE — Progress Notes (Signed)
Pt has had multiple episodes of diarrhea during the night.

## 2014-02-05 NOTE — Progress Notes (Signed)
[facility] PROGRESS NOTE  April [name] LFY:[id number] DOB: 08/19/1925 DOA: 01/24/2014 PCP: Tommy Medal, MD  Assessment/Plan: Hematochezia secondary to cancer of the ascending colon Pathology demonstrated adenocarcinoma of the colon. Biopsies from upper endoscopy revealed duodenitis wihtout malignancy. She underwent a nuclear scan initially which did identify an area of concern in the mid ascending colon. Angiogram was done however, could not identify the bleeding vessel. Her bleeding subsequently subsided. She underwent EGD and colonoscopy which identified a colonic mass in the proximal colon. Patient underwent Laparoscopic R hemicolectomy on 1/5. Large post-operative bleed noted (see below). Pt then underwent ex-lap with resection of SB anastomosis and new ileocolostomy. Hgb has since remained stable overnight  Leukocytosis with eosinophilia  WBC continues to trend down. Continue to monitor. Suspect due to the cancer. Pt is afebrile and is not toxic-appearing  Acute blood loss anemia  Noted to have a large drop in hgb post-operatively. Pt was taken back to OR 01/31/14 and is now s/p ex-lap with resection of small bowel anastamosis and new ileocolostomy. Hgb is holding stable overnight  Thickened endometrium/fluid-filled uterus This was incidentally noted on the CT during this admission, as well as a previous CT scan done in September. Had a long discussion with the patient regarding the same. She denies any vaginal bleeding. Initially plan was for outpatient management, but considering a new finding of colonic mass. An ultrasound was done showing fluid-filled uterus. Was recently discussed with GYN, recommend outpatient management for the same. No need for urgent intervention..  E Coli on Urine Culture Not truly symptomatic, but was treated with Keflex. Keflex was stopped due to bowel prep for GI surgery. Can likely observe off of antibiotics for now.  Diarrhea Cdiff negative Will  stop reglan that was recently ordered for concerns of developing ileus  Code Status: Full Family Communication: Pt in room Disposition Plan: Pending   Procedures:  Laparoscopic colectomy 01/30/14  Ex-lap with resection of SB anastamosis and new ileocolostomy 01/31/14  Antibiotics:  none  HPI/Subjective: Continues to have multiple bowel movements overnight.  Objective: Filed Vitals:   02/05/14 1200 02/05/14 1300 02/05/14 1400 02/05/14 1500  BP: 120/41  123/42   Pulse: 93 101 94 99  Temp: 98.2 F (36.8 C)     TempSrc: Oral     Resp: 18 18 17 19   Height:      Weight:      SpO2: 100% 100% 100% 100%    Intake/Output Summary (Last 24 hours) at 02/05/14 1530 Last data filed at 02/05/14 1500  Gross per 24 hour  Intake   1200 ml  Output      0 ml  Net   1200 ml   Filed Weights   02/03/14 0400 02/04/14 0500 02/05/14 0204  Weight: 73 kg (160 lb 15 oz) 72 kg (158 lb 11.7 oz) 69.8 kg (153 lb 14.1 oz)    Exam:   General:  Awake, in nad  Cardiovascular: regular, s1, s2  Respiratory: normal resp effort, no wheezing  Abdomen: soft, nondistended  Musculoskeletal: perfused, no clubbing   Data Reviewed: Basic Metabolic Panel:  Recent Labs Lab 01/30/14 2331 01/31/14 0340 02/01/14 0440 02/03/14 0342 02/04/14 0932  NA 137 137 140 141 138  K 4.4 3.5 3.7 3.8 3.5  CL 111 109 113* 107 102  CO2 19 21 25 28 29   GLUCOSE 257* 262* 168* 133* 132*  BUN 10 11 9  <5* <5*  CREATININE 0.90 0.85 0.64 0.60 0.59  CALCIUM 7.4* 7.3* 7.1* 8.0* 8.3*  Liver Function Tests: No results for input(s): AST, ALT, ALKPHOS, BILITOT, PROT, ALBUMIN in the last 168 hours. No results for input(s): LIPASE, AMYLASE in the last 168 hours. No results for input(s): AMMONIA in the last 168 hours. CBC:  Recent Labs Lab 01/30/14 2331  02/01/14 0440 02/02/14 0610 02/03/14 0342 02/04/14 0932 02/05/14 0345  WBC 30.0*  < > 32.3* 28.7* 20.4* 25.1* 22.7*  NEUTROABS 23.4*  --   --   --   --  13.6*   --   HGB 10.5*  < > 9.4* 10.6* 10.2* 11.0* 10.5*  HCT 31.7*  < > 27.7* 31.7* 31.1* 34.4* 33.1*  MCV 87.6  < > 83.4 84.5 86.1 86.9 87.6  PLT 83*  < > 102* 139* 142* 155 169  < > = values in this interval not displayed. Cardiac Enzymes: No results for input(s): CKTOTAL, CKMB, CKMBINDEX, TROPONINI in the last 168 hours. BNP (last 3 results) No results for input(s): PROBNP in the last 8760 hours. CBG:  Recent Labs Lab 01/31/14 0919 01/31/14 1227 01/31/14 1255  GLUCAP 205* 164* 164*    Recent Results (from the past 240 hour(s))  Surgical pcr screen     Status: Abnormal   Collection Time: 01/29/14  2:52 PM  Result Value Ref Range Status   MRSA, PCR NEGATIVE NEGATIVE Final   Staphylococcus aureus POSITIVE (A) NEGATIVE Final    Comment:        The Xpert SA Assay (FDA approved for NASAL specimens in patients over 43 years of age), is one component of a comprehensive surveillance program.  Test performance has been validated by [employer] for patients greater than or equal to 78 year old. It is not intended to diagnose infection nor to guide or monitor treatment.   Clostridium Difficile by PCR     Status: None   Collection Time: 02/04/14  8:58 PM  Result Value Ref Range Status   C difficile by pcr NEGATIVE NEGATIVE Final    Comment: Performed at [facility]     Studies: No results found.  Scheduled Meds: . acetaminophen  1,000 mg Oral TID  . bismuth subsalicylate  30 mL Oral BID  . lip balm  1 application Topical BID  . saccharomyces boulardii  250 mg Oral BID   Continuous Infusions: . dextrose 5 % and 0.45 % NaCl with KCl 20 mEq/L 50 mL/hr at 02/04/14 2345    Principal Problem:   Colon cancer Active Problems:   hx: breast cancer, DCIS, right, receptor +   Colonic hemorrhage   Thickened endometrium   Acute blood loss anemia   Rectal bleeding   Colonic mass   Lap assisted right hemicolectomy Jan 2016  Time spent: 74min  CH[facility], [place]  [facility] Pager [phone]. If 7PM-7AM, please contact night-coverage at [email or web address], password [facility] 02/05/2014, 3:30 PM  LOS: 12 days

## 2014-02-05 NOTE — Progress Notes (Signed)
5 Days Post-Op  Subjective: She looks better but having green liquid diarrhea.  She is hesitant to try the clears, but I told her to take what she likes.   Objective: Vital signs in last 24 hours: Temp:  [97.9 F (36.6 C)-98.4 F (36.9 C)] 98 F (36.7 C) (01/11 0300) Pulse Rate:  [98-111] 98 (01/11 0600) Resp:  [14-25] 16 (01/11 0600) BP: (107-133)/(29-62) 121/52 mmHg (01/11 0600) SpO2:  [94 %-100 %] 97 % (01/11 0600) Weight:  [69.8 kg (153 lb 14.1 oz)] 69.8 kg (153 lb 14.1 oz) (01/11 0204) Last BM Date: 02/04/14 Npo  Diet: none 15 stools recorded Afebrile, Still tachycardic, BP stable WBC 22.7 H/H is stable Intake/Output from previous day: 01/10 0701 - 01/11 0700 In: 1250 [I.V.:1250] Out: -  Intake/Output this shift:    General appearance: alert, cooperative and no distress Resp: clear to auscultation bilaterally and anterior, she's on bed pan now. GI: soft sore, + BS, incision looks fine.  Lab Results:   Recent Labs  02/04/14 0932 02/05/14 0345  WBC 25.1* 22.7*  HGB 11.0* 10.5*  HCT 34.4* 33.1*  PLT 155 169    BMET  Recent Labs  02/03/14 0342 02/04/14 0932  NA 141 138  K 3.8 3.5  CL 107 102  CO2 28 29  GLUCOSE 133* 132*  BUN <5* <5*  CREATININE 0.60 0.59  CALCIUM 8.0* 8.3*   PT/INR No results for input(s): LABPROT, INR in the last 72 hours.  No results for input(s): AST, ALT, ALKPHOS, BILITOT, PROT, ALBUMIN in the last 168 hours.   Lipase     Component Value Date/Time   LIPASE 33 05/26/2009 0511     Studies/Results: No results found.  Medications: . metoCLOPramide (REGLAN) injection  5 mg Intravenous 3 times per day    Assessment/Plan 1. Right colon mass, likely adenocarcinoma, with recent bleeding INVASIVE ADENOCARCINOMA 2. A)Laparoscopic assisted right hemicolectomy, 01/30/14, Dr. Johnathan Hausen,  Post op bleed requiring transfusion  B) EXPLORATORY LAPAROTOMY, RESECTION OF PRIOR SMALL BOWEL ANASTOMOSIS, UPPER  ENDOSCOPY, CREATED A NEW ILEO COLOSTOMY  01/31/14 Dr. Hassell Done. 3. Acute blood loss anemia 4. Post op ileus 5. Possible fluid overload, I don't see a 2D echo or prior stress testing with EF. 4. Leukocytosis with eosinophilia 5. Hypertension 6. Breast cancer Right/DCIS/ER positive PR positive   Plan:  She has orders for C diff, she is walking between bouts of diarrhea.  I think she can go to the floor.  I will start her on some clear liquids.  She is on SCD's for DVT prophylaxis, but we need to decide on restarting Heparin or Lovenox, to add to this.    LOS: 12 days    April [name] 02/05/2014

## 2014-02-06 LAB — CBC
HEMATOCRIT: 32.7 % — AB (ref 36.0–46.0)
HEMOGLOBIN: 10.4 g/dL — AB (ref 12.0–15.0)
MCH: 28.2 pg (ref 26.0–34.0)
MCHC: 31.8 g/dL (ref 30.0–36.0)
MCV: 88.6 fL (ref 78.0–100.0)
Platelets: 174 10*3/uL (ref 150–400)
RBC: 3.69 MIL/uL — ABNORMAL LOW (ref 3.87–5.11)
RDW: 16.9 % — AB (ref 11.5–15.5)
WBC: 20.2 10*3/uL — ABNORMAL HIGH (ref 4.0–10.5)

## 2014-02-06 MED ORDER — ONDANSETRON HCL 4 MG PO TABS: 4.0000 mg | ORAL_TABLET | Freq: Four times a day (QID) | ORAL | Status: DC | PRN

## 2014-02-06 MED ORDER — TRAMADOL HCL 50 MG PO TABS: 50.0000 mg | ORAL_TABLET | Freq: Four times a day (QID) | ORAL | Status: DC | PRN

## 2014-02-06 MED ORDER — BISMUTH SUBSALICYLATE 262 MG/15ML PO SUSP
30.0000 mL | Freq: Three times a day (TID) | ORAL | Status: DC | PRN
  Filled 2014-02-06: qty 236

## 2014-02-06 MED ORDER — LACTATED RINGERS IV BOLUS (SEPSIS): 1000.0000 mL | Freq: Three times a day (TID) | INTRAVENOUS | Status: AC | PRN

## 2014-02-06 MED ORDER — FLUTICASONE PROPIONATE 50 MCG/ACT NA SUSP
2.0000 | Freq: Every day | NASAL | Status: DC
  Filled 2014-02-06: qty 16

## 2014-02-06 MED ORDER — ONDANSETRON 4 MG PO TBDP
4.0000 mg | ORAL_TABLET | Freq: Four times a day (QID) | ORAL | Status: DC | PRN
  Administered 2014-02-08 (×2): 4 mg via ORAL
  Administered 2014-02-09: 8 mg via ORAL
  Filled 2014-02-06 (×4): qty 2

## 2014-02-06 NOTE — Progress Notes (Signed)
6 Days Post-Op  Subjective: She looks really good, says she had some nausea with her clears, still having loose stools.  She wants some eggs.    Objective: Vital signs in last 24 hours: Temp:  [97.5 F (36.4 C)-98.2 F (36.8 C)] 98 F (36.7 C) (01/12 0500) Pulse Rate:  [86-104] 86 (01/11 2220) Resp:  [14-26] 16 (01/12 0500) BP: (120-134)/(40-65) 126/65 mmHg (01/11 2220) SpO2:  [95 %-100 %] 97 % (01/11 2220) Weight:  [65.726 kg (144 lb 14.4 oz)] 65.726 kg (144 lb 14.4 oz) (01/12 0328) Last BM Date: 02/05/14 PO not recorded,  Diet: clears 8 stools recorded C diff is negative Afebrile, VSS WBC slowly improving Intake/Output from previous day: 01/11 0701 - 01/12 0700 In: 1143.3 [I.V.:1143.3] Out: 1275 [Urine:1275] Intake/Output this shift:    General appearance: alert, cooperative and no distress Resp: clear to auscultation bilaterally and some rales in bases GI: soft sore, + BS, lots of loose stool, site looks good.  Lab Results:   Recent Labs  02/05/14 0345 02/06/14 0515  WBC 22.7* 20.2*  HGB 10.5* 10.4*  HCT 33.1* 32.7*  PLT 169 174    BMET  Recent Labs  02/04/14 0932  NA 138  K 3.5  CL 102  CO2 29  GLUCOSE 132*  BUN <5*  CREATININE 0.59  CALCIUM 8.3*   PT/INR No results for input(s): LABPROT, INR in the last 72 hours.  No results for input(s): AST, ALT, ALKPHOS, BILITOT, PROT, ALBUMIN in the last 168 hours.   Lipase     Component Value Date/Time   LIPASE 33 05/26/2009 0511     Studies/Results: No results found.  Medications: . acetaminophen  1,000 mg Oral TID  . bismuth subsalicylate  30 mL Oral BID  . fluticasone  2 spray Each Nare Daily  . lip balm  1 application Topical BID  . saccharomyces boulardii  250 mg Oral BID    Assessment/Plan 1. Right colon mass, likely adenocarcinoma, with recent bleeding INVASIVE ADENOCARCINOMA 2. A)Laparoscopic assisted right hemicolectomy, 01/30/14, Dr. Johnathan Hausen,  Post op bleed  requiring transfusion  B) EXPLORATORY LAPAROTOMY, RESECTION OF PRIOR SMALL BOWEL ANASTOMOSIS, UPPER ENDOSCOPY, CREATED A NEW ILEO COLOSTOMY  01/31/14 Dr. Hassell Done. 3. Acute blood loss anemia 4. Post op ileus 5. Possible fluid overload, I don't see a 2D echo or prior stress testing with EF. 4. Leukocytosis with eosinophilia 5. Hypertension 6. Breast cancer Right/DCIS/ER positive PR positive   Plan:  I told her to eat breakfast with clears and if she did well we would give her full liquids at lunch.  I ask her to continue walking and IS. I will recheck labs on Thursday 1/14.  We should start discharge planning.    LOS: 13 days    April [name] 02/06/2014

## 2014-02-06 NOTE — Evaluation (Signed)
Physical Therapy Evaluation Patient Details Name: April [name] MRN: [id number] DOB: July 23, 1925 Today's Date: 02/06/2014   History of Present Illness  81yo female adm 01/24/14 with lower GIB; PMHx: HTN. Dx of colon cancer. Pt is s/p hemicolectomy 01/30/14.   Clinical Impression  *Pt admitted with above diagnosis. Pt currently with functional limitations due to the deficits listed below (see PT Problem List). ** Pt will benefit from skilled PT to increase their independence and safety with mobility to allow discharge to the venue listed below.    Pt ambulated 170' with RW without loss of balance. At baseline she is very active, she does advanced tai chi several times per week and walks without assistive device. Will follow to progress mobility as tolerated.  **    Follow Up Recommendations No PT follow up    Equipment Recommendations  None recommended by PT    Recommendations for Other Services       Precautions / Restrictions Precautions Precautions: None Restrictions Weight Bearing Restrictions: No      Mobility  Bed Mobility Overal bed mobility: Modified Independent Bed Mobility: Supine to Sit     Supine to sit: Modified independent (Device/Increase time)     General bed mobility comments: with rail  Transfers Overall transfer level: Modified independent Equipment used: Rolling walker (2 wheeled)             General transfer comment: BUE assist to pushup to stand  Ambulation/Gait   Ambulation Distance (Feet): 170 Feet Assistive device: Rolling walker (2 wheeled) Gait Pattern/deviations: Step-through pattern   Gait velocity interpretation: Below normal speed for age/gender General Gait Details: no LOB with turns, stops; no hx of falls per pt  Stairs            Wheelchair Mobility    Modified Rankin (Stroke Patients Only)       Balance Overall balance assessment: No apparent balance deficits (not formally assessed);Modified Independent                                           Pertinent Vitals/Pain Pain Assessment: No/denies pain    Home Living Family/patient expects to be discharged to:: Private residence Living Arrangements: Spouse/significant other Available Help at Discharge: Family Type of Home: Apartment (at [employer]) Home Access: Level entry     Home Layout: One level Home Equipment: None      Prior Function Level of Independence: Independent         Comments: Pt and husband reside at [facility]; she is quite  active/I at baseline and does a great deal of Tai Chi     Hand Dominance        Extremity/Trunk Assessment   Upper Extremity Assessment: Overall WFL for tasks assessed           Lower Extremity Assessment: Overall WFL for tasks assessed      Cervical / Trunk Assessment: Normal  Communication   Communication: No difficulties  Cognition Arousal/Alertness: Awake/alert Behavior During Therapy: WFL for tasks assessed/performed Overall Cognitive Status: Within Functional Limits for tasks assessed                      General Comments      Exercises        Assessment/Plan    PT Assessment Patient needs continued PT services  PT Diagnosis Difficulty walking   PT  Problem List Decreased activity tolerance  PT Treatment Interventions Gait training;Functional mobility training;Therapeutic activities;Therapeutic exercise;Balance training;Patient/family education   PT Goals (Current goals can be found in the Care Plan section) Acute Rehab PT Goals Patient Stated Goal: to return to [place] and eating good food (esp a YumYum hotdog) PT Goal Formulation: With patient Time For Goal Achievement: 02/20/14 Potential to Achieve Goals: Good    Frequency Min 3X/week   Barriers to discharge        Co-evaluation               End of Session Equipment Utilized During Treatment: Gait belt Activity Tolerance: Patient tolerated treatment well Patient  left: with call bell/phone within reach;in bed Nurse Communication: Mobility status         Time: 1124-1150 PT Time Calculation (min) (ACUTE ONLY): 26 min   Charges:   PT Evaluation $Initial PT Evaluation Tier I: 1 Procedure PT Treatments $Gait Training: 8-22 mins   PT G Codes:        Philomena Doheny 02/06/2014, 11:59 AM [phone]

## 2014-02-06 NOTE — Progress Notes (Addendum)
[facility] PROGRESS NOTE  April [name] NTI:[id number] DOB: 1925/03/09 DOA: 01/24/2014 PCP: Tommy Medal, MD  Off Service Summary: From 56/64-21/88: 81 year old Caucasian female presented with rectal bleeding. She was transfused PRBCs. CT scan suggested bleeding from angiodysplasia. She was admitted for further management. She she underwent colonoscopy and EGD. Colonic mass was noted along with some abnormal duodenal findings. Biopsies have been obtained. General surgery has been consulted.  1/6-1/12: The patient underwent laproscopic R hemicolectomy on 1/5. A large bleed developed post-operatively and the patient was first transferred to the ICU and later underwent ex-lap with resection of small bowel anastamosis and new ileocolostomy on 1/6. The patient's hgb has since remained stable. She is now slowly tolerating an advancing diet. Therapy has since been consulted with recs for possible rehab at [facility].  Assessment/Plan: Hematochezia secondary to cancer of the ascending colon Pathology demonstrated adenocarcinoma of the colon. Biopsies from upper endoscopy revealed duodenitis wihtout malignancy. She underwent a nuclear scan initially which did identify an area of concern in the mid ascending colon. Angiogram was done however, could not identify the bleeding vessel. Her bleeding subsequently subsided. She underwent EGD and colonoscopy which identified a colonic mass in the proximal colon. Patient underwent Laparoscopic R hemicolectomy on 1/5. Large post-operative bleed noted (see below). Pt then underwent ex-lap with resection of SB anastomosis and new ileocolostomy. Thus far, hgb is holding steady since surgery. Cont to follow cbc and transfuse as needed. Advancing diet per general surgery  Leukocytosis with eosinophilia  WBC continues to trend down. Continue to monitor. Suspect due to the cancer. Pt is afebrile and is not toxic-appearing  Acute blood loss anemia  Noted to have a  large drop in hgb post-operatively. Pt was taken back to OR 01/31/14 and is now s/p ex-lap with resection of small bowel anastamosis and new ileocolostomy. Hgb is holding stable overnight  Thickened endometrium/fluid-filled uterus This was incidentally noted on the CT during this admission, as well as a previous CT scan done in September. Had a long discussion with the patient regarding the same. She denies any vaginal bleeding. Initially plan was for outpatient management, but considering a new finding of colonic mass. An ultrasound was done showing fluid-filled uterus. Was recently discussed with GYN, recommend outpatient management for the same. No need for urgent intervention..  E Coli on Urine Culture Not truly symptomatic, but was treated with Keflex. Keflex was stopped due to bowel prep for GI surgery. Can likely observe off of antibiotics for now.  Diarrhea Cdiff negative Will stop reglan that was recently ordered for concerns of developing ileus  Code Status: Full Family Communication: Pt in room Disposition Plan: Pending   Procedures:  Laparoscopic colectomy 01/30/14  Ex-lap with resection of SB anastamosis and new ileocolostomy 01/31/14  Antibiotics:  none  HPI/Subjective: No acute events noted. Pt is without complaints. Tolerating yogurt  Objective: Filed Vitals:   02/05/14 1733 02/05/14 2220 02/06/14 0328 02/06/14 0500  BP: 126/56 126/65    Pulse: 103 86    Temp: 97.7 F (36.5 C) 97.7 F (36.5 C)  98 F (36.7 C)  TempSrc: Oral Oral  Axillary  Resp: 15 18  16   Height:      Weight:   65.726 kg (144 lb 14.4 oz)   SpO2: 95% 97%      Intake/Output Summary (Last 24 hours) at 02/06/14 1334 Last data filed at 02/06/14 1000  Gross per 24 hour  Intake 1253.33 ml  Output   1275 ml  Net -21.67  ml   Filed Weights   02/04/14 0500 02/05/14 0204 02/06/14 0328  Weight: 72 kg (158 lb 11.7 oz) 69.8 kg (153 lb 14.1 oz) 65.726 kg (144 lb 14.4 oz)    Exam:   General:   Awake, in nad  Cardiovascular: regular, s1, s2  Respiratory: normal resp effort, no wheezing  Abdomen: soft, nondistended  Musculoskeletal: perfused, no clubbing   Data Reviewed: Basic Metabolic Panel:  Recent Labs Lab 01/30/14 2331 01/31/14 0340 02/01/14 0440 02/03/14 0342 02/04/14 0932  NA 137 137 140 141 138  K 4.4 3.5 3.7 3.8 3.5  CL 111 109 113* 107 102  CO2 19 21 25 28 29   GLUCOSE 257* 262* 168* 133* 132*  BUN 10 11 9  <5* <5*  CREATININE 0.90 0.85 0.64 0.60 0.59  CALCIUM 7.4* 7.3* 7.1* 8.0* 8.3*   Liver Function Tests: No results for input(s): AST, ALT, ALKPHOS, BILITOT, PROT, ALBUMIN in the last 168 hours. No results for input(s): LIPASE, AMYLASE in the last 168 hours. No results for input(s): AMMONIA in the last 168 hours. CBC:  Recent Labs Lab 01/30/14 2331  02/02/14 0610 02/03/14 0342 02/04/14 0932 02/05/14 0345 02/06/14 0515  WBC 30.0*  < > 28.7* 20.4* 25.1* 22.7* 20.2*  NEUTROABS 23.4*  --   --   --  13.6*  --   --   HGB 10.5*  < > 10.6* 10.2* 11.0* 10.5* 10.4*  HCT 31.7*  < > 31.7* 31.1* 34.4* 33.1* 32.7*  MCV 87.6  < > 84.5 86.1 86.9 87.6 88.6  PLT 83*  < > 139* 142* 155 169 174  < > = values in this interval not displayed. Cardiac Enzymes: No results for input(s): CKTOTAL, CKMB, CKMBINDEX, TROPONINI in the last 168 hours. BNP (last 3 results) No results for input(s): PROBNP in the last 8760 hours. CBG:  Recent Labs Lab 01/31/14 0919 01/31/14 1227 01/31/14 1255  GLUCAP 205* 164* 164*    Recent Results (from the past 240 hour(s))  Surgical pcr screen     Status: Abnormal   Collection Time: 01/29/14  2:52 PM  Result Value Ref Range Status   MRSA, PCR NEGATIVE NEGATIVE Final   Staphylococcus aureus POSITIVE (A) NEGATIVE Final    Comment:        The Xpert SA Assay (FDA approved for NASAL specimens in patients over 63 years of age), is one component of a comprehensive surveillance program.  Test performance has been validated by  [facility] for patients greater than or equal to 28 year old. It is not intended to diagnose infection nor to guide or monitor treatment.   Clostridium Difficile by PCR     Status: None   Collection Time: 02/04/14  8:58 PM  Result Value Ref Range Status   C difficile by pcr NEGATIVE NEGATIVE Final    Comment: Performed at [facility]     Studies: No results found.  Scheduled Meds: . acetaminophen  1,000 mg Oral TID  . fluticasone  2 spray Each Nare Daily  . lip balm  1 application Topical BID  . saccharomyces boulardii  250 mg Oral BID   Continuous Infusions:    Principal Problem:   Colon cancer Active Problems:   hx: breast cancer, DCIS, right, receptor +   Colonic hemorrhage   Thickened endometrium   Acute blood loss anemia   Rectal bleeding   Colonic mass   Lap assisted right hemicolectomy Jan 2016  Time spent: 17min  Kanoe Wanner K  [facility] Pager [phone]. If 7PM-7AM, please contact night-coverage at [email or web address], password [facility] 02/06/2014, 1:34 PM  LOS: 13 days

## 2014-02-06 NOTE — Evaluation (Signed)
Occupational Therapy Evaluation Patient Details Name: April [name] MRN: [id number] DOB: 05-Apr-1925 Today's Date: 02/06/2014    History of Present Illness 81yo female adm 01/24/14 with lower GIB; PMHx: HTN. Dx of colon cancer. Pt is s/p hemicolectomy 01/30/14.    Clinical Impression   Pt was admitted for the above.  At baseline, she is very active.  At time of evaluation, she had no c/o pain but was fatiqued.  Will follow in acute with mod I level goals.  Pt was independent prior to admission    Follow Up Recommendations  Supervision/Assistance - 24 hour (Pt may go to rehab at [facility] for a few days)    Equipment Recommendations  None recommended by OT    Recommendations for Other Services       Precautions / Restrictions Precautions Precautions: None Restrictions Weight Bearing Restrictions: No      Mobility Bed Mobility  Overal bed mobility: Modified Independent Bed Mobility: Supine to Sit     Supine to sit: Modified independent (Device/Increase time)     General bed mobility comments: mod I, HOB raised, use of rail  Transfers PER PT Overall transfer level: Modified independent Equipment used: Rolling walker (2 wheeled)             General transfer comment: NT in OT:  mod I with PT    Balance Overall balance assessment: No apparent balance deficits (not formally assessed);Modified Independent                                          ADL Overall ADL's : Needs assistance/impaired                                       General ADL Comments: pt is able to reach to feet for ADLs--fatiqued this afternoon.  ADLs overall set up level.       Vision                     Perception     Praxis      Pertinent Vitals/Pain Pain Assessment: No/denies pain     Hand Dominance     Extremity/Trunk Assessment Upper Extremity Assessment Upper Extremity Assessment: Overall WFL for tasks assessed      Cervical /  Trunk Assessment Cervical / Trunk Assessment: Normal   Communication Communication Communication: No difficulties   Cognition Arousal/Alertness: Awake/alert Behavior During Therapy: WFL for tasks assessed/performed Overall Cognitive Status: Within Functional Limits for tasks assessed                     General Comments       Exercises       Shoulder Instructions      Home Living Family/patient expects to be discharged to:: Private residence Living Arrangements: Spouse/significant other Available Help at Discharge: Family Type of Home: Apartment (at [employer]) Home Access: Level entry     Home Layout: One level     Bathroom Shower/Tub: Occupational psychologist: Handicapped height     Home Equipment: Shower seat - built in;Grab bars - toilet;Grab bars - tub/shower          Prior Functioning/Environment Level of Independence: Independent        Comments: Pt and husband reside at [facility];  she is quite  active/I at baseline and does a great deal of Tai Chi    OT Diagnosis: Generalized weakness   OT Problem List: Decreased strength;Decreased activity tolerance   OT Treatment/Interventions: Self-care/ADL training;DME and/or AE instruction;Patient/family education    OT Goals(Current goals can be found in the care plan section) Acute Rehab OT Goals Patient Stated Goal: to return to [place] and eating good food (esp a YumYum hotdog) OT Goal Formulation: With patient Time For Goal Achievement: 02/13/14 Potential to Achieve Goals: Good ADL Goals Pt Will Transfer to Toilet: with modified independence;ambulating (high commode with grab bars) Pt Will Perform Toileting - Clothing Manipulation and hygiene: Independently;sit to/from stand Pt Will Perform Tub/Shower Transfer: Shower transfer;with modified independence;ambulating;shower seat Additional ADL Goal #1: pt will gather clothes at mod I level and complete adl  OT Frequency: Min 2X/week    Barriers to D/C:            Co-evaluation              End of Session    Activity Tolerance: Patient tolerated treatment well Patient left: in bed;with call bell/phone within reach   Time: 1352-1407 OT Time Calculation (min): 15 min Charges:  OT General Charges $OT Visit: 1 Procedure OT Evaluation $Initial OT Evaluation Tier I: 1 Procedure G-Codes:    Yukie Bergeron February 17, 2014, 2:52 PM   Lesle Chris, OTR/L [phone] 17-Feb-2014

## 2014-02-07 DIAGNOSIS — K921 Melena: Secondary | ICD-10-CM

## 2014-02-07 DIAGNOSIS — D72829 Elevated white blood cell count, unspecified: Secondary | ICD-10-CM | POA: Insufficient documentation

## 2014-02-07 DIAGNOSIS — R197 Diarrhea, unspecified: Secondary | ICD-10-CM

## 2014-02-07 LAB — CBC WITH DIFFERENTIAL/PLATELET
BASOS ABS: 0 10*3/uL (ref 0.0–0.1)
Basophils Relative: 0 % (ref 0–1)
Eosinophils Absolute: 7.7 10*3/uL — ABNORMAL HIGH (ref 0.0–0.7)
Eosinophils Relative: 33 % — ABNORMAL HIGH (ref 0–5)
HEMATOCRIT: 34.7 % — AB (ref 36.0–46.0)
Hemoglobin: 10.8 g/dL — ABNORMAL LOW (ref 12.0–15.0)
LYMPHS ABS: 1.6 10*3/uL (ref 0.7–4.0)
Lymphocytes Relative: 7 % — ABNORMAL LOW (ref 12–46)
MCH: 27.5 pg (ref 26.0–34.0)
MCHC: 31.1 g/dL (ref 30.0–36.0)
MCV: 88.3 fL (ref 78.0–100.0)
MONO ABS: 1.2 10*3/uL — AB (ref 0.1–1.0)
Monocytes Relative: 5 % (ref 3–12)
Neutro Abs: 12.8 10*3/uL — ABNORMAL HIGH (ref 1.7–7.7)
Neutrophils Relative %: 55 % (ref 43–77)
Platelets: 203 10*3/uL (ref 150–400)
RBC: 3.93 MIL/uL (ref 3.87–5.11)
RDW: 16.6 % — ABNORMAL HIGH (ref 11.5–15.5)
WBC: 23.3 10*3/uL — ABNORMAL HIGH (ref 4.0–10.5)

## 2014-02-07 LAB — COMPREHENSIVE METABOLIC PANEL
ALBUMIN: 3.2 g/dL — AB (ref 3.5–5.2)
ALK PHOS: 53 U/L (ref 39–117)
ALT: 13 U/L (ref 0–35)
AST: 12 U/L (ref 0–37)
Anion gap: 6 (ref 5–15)
BUN: 10 mg/dL (ref 6–23)
CHLORIDE: 105 meq/L (ref 96–112)
CO2: 25 mmol/L (ref 19–32)
Calcium: 8.1 mg/dL — ABNORMAL LOW (ref 8.4–10.5)
Creatinine, Ser: 0.6 mg/dL (ref 0.50–1.10)
GFR calc Af Amer: >90 mL/min (ref >90)
GFR, EST NON AFRICAN AMERICAN: 80 mL/min — AB (ref >90)
Glucose, Bld: 104 mg/dL — ABNORMAL HIGH (ref 70–99)
POTASSIUM: 3.3 mmol/L — AB (ref 3.5–5.1)
Sodium: 136 mmol/L (ref 135–145)
Total Bilirubin: 1.2 mg/dL (ref 0.3–1.2)
Total Protein: 5.8 g/dL — ABNORMAL LOW (ref 6.0–8.3)

## 2014-02-07 LAB — PATHOLOGIST SMEAR REVIEW

## 2014-02-07 LAB — MAGNESIUM: Magnesium: 1.8 mg/dL (ref 1.5–2.5)

## 2014-02-07 MED ORDER — METRONIDAZOLE 0.75 % EX GEL
1.0000 "application " | Freq: Every morning | CUTANEOUS | Status: DC
  Administered 2014-02-07: 1 via TOPICAL
  Filled 2014-02-07: qty 45

## 2014-02-07 MED ORDER — LOPERAMIDE HCL 2 MG PO CAPS
2.0000 mg | ORAL_CAPSULE | Freq: Every day | ORAL | Status: DC
  Administered 2014-02-07 – 2014-02-08 (×2): 2 mg via ORAL
  Filled 2014-02-07 (×3): qty 1

## 2014-02-07 MED ORDER — FERROUS SULFATE 325 (65 FE) MG PO TABS
325.0000 mg | ORAL_TABLET | Freq: Three times a day (TID) | ORAL | Status: DC
  Administered 2014-02-07 – 2014-02-09 (×7): 325 mg via ORAL
  Filled 2014-02-07 (×11): qty 1

## 2014-02-07 MED ORDER — MAGNESIUM SULFATE 2 GM/50ML IV SOLN
2.0000 g | Freq: Once | INTRAVENOUS | Status: DC
  Filled 2014-02-07: qty 50

## 2014-02-07 MED ORDER — POTASSIUM CHLORIDE CRYS ER 20 MEQ PO TBCR
40.0000 meq | EXTENDED_RELEASE_TABLET | Freq: Once | ORAL | Status: AC
  Administered 2014-02-07: 40 meq via ORAL
  Filled 2014-02-07: qty 2

## 2014-02-07 MED ORDER — COLESTIPOL HCL 1 G PO TABS
1.0000 g | ORAL_TABLET | Freq: Two times a day (BID) | ORAL | Status: DC
  Administered 2014-02-07 – 2014-02-09 (×5): 1 g via ORAL
  Filled 2014-02-07 (×8): qty 1

## 2014-02-07 MED ORDER — LOPERAMIDE HCL 2 MG PO CAPS: 2.0000 mg | ORAL_CAPSULE | Freq: Three times a day (TID) | ORAL | Status: DC | PRN

## 2014-02-07 MED ORDER — MAGNESIUM SULFATE 50 % IJ SOLN
2.0000 g | Freq: Once | INTRAVENOUS | Status: AC
  Administered 2014-02-07: 2 g via INTRAVENOUS
  Filled 2014-02-07: qty 4

## 2014-02-07 MED ORDER — VITAMIN D3 25 MCG (1000 UNIT) PO TABS
4000.0000 [IU] | ORAL_TABLET | Freq: Every day | ORAL | Status: DC
  Administered 2014-02-07 – 2014-02-08 (×2): 4000 [IU] via ORAL
  Filled 2014-02-07 (×4): qty 4

## 2014-02-07 MED ORDER — ADULT MULTIVITAMIN W/MINERALS CH
1.0000 | ORAL_TABLET | Freq: Every day | ORAL | Status: DC
  Administered 2014-02-07 – 2014-02-09 (×3): 1 via ORAL
  Filled 2014-02-07 (×3): qty 1

## 2014-02-07 MED ORDER — HEPARIN SODIUM (PORCINE) 5000 UNIT/ML IJ SOLN
5000.0000 [IU] | Freq: Three times a day (TID) | INTRAMUSCULAR | Status: DC
  Administered 2014-02-07 – 2014-02-09 (×6): 5000 [IU] via SUBCUTANEOUS
  Filled 2014-02-07 (×9): qty 1

## 2014-02-07 MED ORDER — LOPERAMIDE HCL 2 MG PO CAPS
2.0000 mg | ORAL_CAPSULE | Freq: Every day | ORAL | Status: DC
  Administered 2014-02-07 – 2014-02-09 (×3): 2 mg via ORAL
  Filled 2014-02-07 (×4): qty 1

## 2014-02-07 NOTE — Progress Notes (Signed)
7 Days Post-Op  Subjective: She is still a little queasy with PO's, and hesitant to eat.  The diarrhea/loose stool is still her main concern.  She says it comes on and she just has no control.  It is not a new issue.  She has had issues with loose stools since her cholecystectomy. She was on colestipol before and this was actually started by her allergist taking care of the eosinophilia.  Her wound looks fine and staples are out.  Objective: Vital signs in last 24 hours: Temp:  [97.7 F (36.5 C)-97.8 F (36.6 C)] 97.8 F (36.6 C) (01/13 0932) Pulse Rate:  [86-100] 100 (01/13 0614) Resp:  [14-16] 16 (01/13 0614) BP: (122-136)/(47-57) 127/57 mmHg (01/13 0614) SpO2:  [98 %-100 %] 100 % (01/13 0614) Weight:  [62.959 kg (138 lb 12.8 oz)] 62.959 kg (138 lb 12.8 oz) (01/13 0500) Last BM Date: 02/06/14 360 PO  13 stools recorded yesterday Full liquid diet Afebrile, VSS Labs pending Intake/Output from previous day: 01/12 0701 - 01/13 0700 In: 360 [P.O.:360] Out: 0  Intake/Output this shift:    General appearance: alert, cooperative and no distress Resp: clear to auscultation bilaterally GI: soft, non-tender; bowel sounds normal; no masses,  no organomegaly and staples out and wound looks fine.  Lab Results:   Recent Labs  02/05/14 0345 02/06/14 0515  WBC 22.7* 20.2*  HGB 10.5* 10.4*  HCT 33.1* 32.7*  PLT 169 174    BMET  Recent Labs  02/04/14 0932  NA 138  K 3.5  CL 102  CO2 29  GLUCOSE 132*  BUN <5*  CREATININE 0.59  CALCIUM 8.3*   PT/INR No results for input(s): LABPROT, INR in the last 72 hours.  No results for input(s): AST, ALT, ALKPHOS, BILITOT, PROT, ALBUMIN in the last 168 hours.   Lipase     Component Value Date/Time   LIPASE 33 05/26/2009 0511     Studies/Results: No results found.  Medications: . acetaminophen  1,000 mg Oral TID  . fluticasone  2 spray Each Nare Daily  . lip balm  1 application Topical BID  . saccharomyces boulardii  250 mg  Oral BID    Assessment/Plan 1. Right colon mass, likely adenocarcinoma, with recent bleeding INVASIVE ADENOCARCINOMA 2. A)Laparoscopic assisted right hemicolectomy, 01/30/14, Dr. Johnathan Hausen,  Post op bleed requiring transfusion  B) EXPLORATORY LAPAROTOMY, RESECTION OF PRIOR SMALL BOWEL ANASTOMOSIS, UPPER ENDOSCOPY, CREATED A NEW ILEO COLOSTOMY  01/31/14 Dr. Hassell Done. 3. Acute blood loss anemia 4. Post op ileus 5. Possible fluid overload, I don't see a 2D echo or prior stress testing with EF. 4. Leukocytosis with eosinophilia 5. 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  If we get her PO intake up and her loose stools better controlled we can aim for discharge on Friday.  Labs ordered for AM.    LOS: 14 days    [name] 02/07/2014

## 2014-02-07 NOTE — Progress Notes (Signed)
[facility] PROGRESS NOTE  April [name] OVF:[id number] DOB: 01/10/1926 DOA: 01/24/2014 PCP: Tommy Medal, MD  Brief summary: From 65/2-58/27: 81 year old Caucasian female presented with rectal bleeding. She was transfused PRBCs. CT scan suggested bleeding from angiodysplasia. She was admitted for further management. She she underwent colonoscopy and EGD. Colonic mass was noted along with some abnormal duodenal findings. Biopsies have been obtained. General surgery has been consulted.  1/6-1/12: The patient underwent laproscopic R hemicolectomy on 1/5. A large bleed developed post-operatively and the patient was first transferred to the ICU and later underwent ex-lap with resection of small bowel anastamosis and new ileocolostomy on 1/6. The patient's hgb has since remained stable. She is now slowly tolerating an advancing diet. Therapy has since been consulted with recs for possible rehab at [facility].     Assessment/Plan: Hematochezia secondary to cancer of the ascending colon Pathology demonstrated adenocarcinoma of the colon. Biopsies from upper endoscopy revealed duodenitis without malignancy. She underwent a nuclear scan initially which did identify an area of concern in the mid ascending colon. Angiogram was done however, could not identify the bleeding vessel. Her bleeding subsequently subsided. She underwent EGD and colonoscopy which identified a colonic mass in the proximal colon. Patient underwent Laparoscopic R hemicolectomy on 1/5. Large post-operative bleed noted (see below). Pt then underwent ex-lap with resection of SB anastomosis and new ileocolostomy. Thus far, hgb is holding steady since surgery. Cont to follow cbc and transfuse as needed. Advancing diet per general surgery  Leukocytosis with eosinophilia  WBC continues to trend down. Continue to monitor. Suspect due to the cancer. Pt is afebrile and is not toxic-appearing  Acute blood loss anemia  Noted to have a  large drop in hgb post-operatively. Pt was taken back to OR 01/31/14 and is now s/p ex-lap with resection of small bowel anastamosis and new ileocolostomy. Hgb is holding stable.   Thickened endometrium/fluid-filled uterus This was incidentally noted on the CT during this admission, as well as a previous CT scan done in September. Dr Earlie Counts had a long discussion with the patient regarding the same. She denied any vaginal bleeding. Initially plan was for outpatient management, but considering a new finding of colonic mass. An ultrasound was done showing fluid-filled uterus. Was recently discussed with GYN, recommend outpatient management for the same. No need for urgent intervention..  E Coli on Urine Culture Not truly symptomatic, but was treated with Keflex. Keflex was stopped due to bowel prep for GI surgery. Continue to observe off of antibiotics for now.  Diarrhea Cdiff negative. Reglan has been discontinued. Patient has been placed on Imodium and colestipol.   Code Status: Full Family Communication: Updated patient and family at bedside. Disposition Plan:  Home when medically stable and tolerating oral intake with improvement in diarrhea.  Consultants:  Gastroenterology: Dr. Amedeo Plenty 01/25/2014  Interventional radiology consultation Dr. Geroge Baseman 01/25/2014  Gen. surgery: Dr. Harlow Asa 01/28/2014  Procedures:  Expiratory laparotomy, upper endoscopy, resection of prior and is to [facility] and creation of a new ileocolonic anastomosis 01/31/2014 per Dr. Hassell Done  Laparoscopic-assisted right hemicolectomy 01/30/2014 per Dr. Hassell Done  Colonoscopy 01/28/2014 per Dr. Amedeo Plenty  Upper endoscopy 01/28/2014 per Dr. Amedeo Plenty  Mesenteric angiogram Dr. Geroge Baseman 01/25/2014  Antibiotics:  None  HPI/Subjective: Patient states she is a low bit better. Patient with complaints of diarrhea/loose stool which per patient and family is chronic in nature. Patient stated was started on colestipol for  this.  Objective: Filed Vitals:   02/07/14 0614  BP: 127/57  Pulse: 100  Temp: 97.8 F (36.6 C)  Resp: 16    Intake/Output Summary (Last 24 hours) at 02/07/14 1208 Last data filed at 02/07/14 1000  Gross per 24 hour  Intake    120 ml  Output      0 ml  Net    120 ml   Filed Weights   02/05/14 0204 02/06/14 0328 02/07/14 0500  Weight: 69.8 kg (153 lb 14.1 oz) 65.726 kg (144 lb 14.4 oz) 62.959 kg (138 lb 12.8 oz)    Exam:   General:  NAD  Cardiovascular: RRR  Respiratory: CTAB  Abdomen: Soft, nontender, nondistended, positive bowel sounds.  Musculoskeletal: No clubbing cyanosis or edema.  Data Reviewed: Basic Metabolic Panel:  Recent Labs Lab 02/01/14 0440 02/03/14 0342 02/04/14 0932 02/07/14 0900  NA 140 141 138 136  K 3.7 3.8 3.5 3.3*  CL 113* 107 102 105  CO2 25 28 29 25   GLUCOSE 168* 133* 132* 104*  BUN 9 <5* <5* 10  CREATININE 0.64 0.60 0.59 0.60  CALCIUM 7.1* 8.0* 8.3* 8.1*  MG  --   --   --  1.8   Liver Function Tests:  Recent Labs Lab 02/07/14 0900  AST 12  ALT 13  ALKPHOS 53  BILITOT 1.2  PROT 5.8*  ALBUMIN 3.2*   No results for input(s): LIPASE, AMYLASE in the last 168 hours. No results for input(s): AMMONIA in the last 168 hours. CBC:  Recent Labs Lab 02/03/14 0342 02/04/14 0932 02/05/14 0345 02/06/14 0515 02/07/14 0900  WBC 20.4* 25.1* 22.7* 20.2* 23.3*  NEUTROABS  --  13.6*  --   --  12.8*  HGB 10.2* 11.0* 10.5* 10.4* 10.8*  HCT 31.1* 34.4* 33.1* 32.7* 34.7*  MCV 86.1 86.9 87.6 88.6 88.3  PLT 142* 155 169 174 203   Cardiac Enzymes: No results for input(s): CKTOTAL, CKMB, CKMBINDEX, TROPONINI in the last 168 hours. BNP (last 3 results) No results for input(s): PROBNP in the last 8760 hours. CBG:  Recent Labs Lab 01/31/14 1227 01/31/14 1255  GLUCAP 164* 164*    Recent Results (from the past 240 hour(s))  Surgical pcr screen     Status: Abnormal   Collection Time: 01/29/14  2:52 PM  Result Value Ref Range  Status   MRSA, PCR NEGATIVE NEGATIVE Final   Staphylococcus aureus POSITIVE (A) NEGATIVE Final    Comment:        The Xpert SA Assay (FDA approved for NASAL specimens in patients over 22 years of age), is one component of a comprehensive surveillance program.  Test performance has been validated by [employer] for patients greater than or equal to 10 year old. It is not intended to diagnose infection nor to guide or monitor treatment.   Clostridium Difficile by PCR     Status: None   Collection Time: 02/04/14  8:58 PM  Result Value Ref Range Status   C difficile by pcr NEGATIVE NEGATIVE Final    Comment: Performed at [facility]     Studies: No results found.  Scheduled Meds: . acetaminophen  1,000 mg Oral TID  . cholecalciferol  4,000 Units Oral QHS  . colestipol  1 g Oral Q12H  . ferrous sulfate  325 mg Oral TID WC  . fluticasone  2 spray Each Nare Daily  . heparin subcutaneous  5,000 Units Subcutaneous 3 times per day  . lip balm  1 application Topical BID  . loperamide  2 mg Oral QHS  . loperamide  2  mg Oral QPC breakfast  . magnesium sulfate 1 - 4 g bolus IVPB  2 g Intravenous Once  . metroNIDAZOLE  1 application Topical q morning - 10a  . multivitamin with minerals  1 tablet Oral Daily  . potassium chloride  40 mEq Oral Once  . saccharomyces boulardii  250 mg Oral BID   Continuous Infusions:   Principal Problem:   Colon cancer Active Problems:   hx: breast cancer, DCIS, right, receptor +   Colonic hemorrhage   Thickened endometrium   Acute blood loss anemia   Rectal bleeding   Colonic mass   Lap assisted right hemicolectomy Jan 2016    Time spent: 90 minutes    [place] [facility] Pager [phone]. If 7PM-7AM, please contact night-coverage at [email or web address], password [facility] 02/07/2014, 12:08 PM  LOS: 14 days

## 2014-02-07 NOTE — Progress Notes (Signed)
Clinical Social Work Department BRIEF PSYCHOSOCIAL ASSESSMENT 02/07/2014  Patient:  April [name], April [name]     Account Number:  [id number]     Admit date:  01/24/2014  Clinical Social Worker:  Lacie Scotts  Date/Time:  02/07/2014 03:03 PM  Referred by:  Physician  Date Referred:  02/07/2014 Referred for  SNF Placement   Other Referral:   Interview type:  Patient Other interview type:    PSYCHOSOCIAL DATA Living Status:  FACILITY Admitted from facility:  [facility] Level of care:  Independent Living Primary support name:  April [name] Primary support relationship to patient:  SPOUSE Degree of support available:   supportive    CURRENT CONCERNS Current Concerns  Post-Acute Placement   Other Concerns:    SOCIAL WORK ASSESSMENT / PLAN Pt is an 81 yr old female living with spouse at [facility] ( I ). CSW met with pt/family to assist with d/c planning. Pt requesting rehab at [facility] following hospital d/c. April [name], at [facility], contacted and reported that there will be [name] re[facility] bed available at d/c. Clinical info provided. CSW will continue to follow to assist with d/c planning.   Assessment/plan status:  Psychosocial Support/Ongoing Assessment of Needs Other assessment/ plan:   Information/referral to community resources:   PT OT recommend no follow up therapy at this time. Pt / family / facility are  aware.    PATIENT'S/FAMILY'S RESPONSE TO PLAN OF CARE: Pt is pleased [name] rehab bed will be available at [facility]. " I'm still not feeling like myself. I think it would be hard for my husband to take care of me. "    April Lean LCSW [phone]

## 2014-02-08 ENCOUNTER — Encounter (HOSPITAL_COMMUNITY): Payer: Self-pay

## 2014-02-08 LAB — COMPREHENSIVE METABOLIC PANEL
ALK PHOS: 56 U/L (ref 39–117)
ALT: 12 U/L (ref 0–35)
AST: 14 U/L (ref 0–37)
Albumin: 3 g/dL — ABNORMAL LOW (ref 3.5–5.2)
Anion gap: 9 (ref 5–15)
BILIRUBIN TOTAL: 0.8 mg/dL (ref 0.3–1.2)
BUN: 9 mg/dL (ref 6–23)
CALCIUM: 8.5 mg/dL (ref 8.4–10.5)
CHLORIDE: 105 meq/L (ref 96–112)
CO2: 26 mmol/L (ref 19–32)
CREATININE: 0.68 mg/dL (ref 0.50–1.10)
GFR calc non Af Amer: 77 mL/min — ABNORMAL LOW (ref >90)
GFR, EST AFRICAN AMERICAN: 89 mL/min — AB (ref >90)
Glucose, Bld: 88 mg/dL (ref 70–99)
POTASSIUM: 3.8 mmol/L (ref 3.5–5.1)
Sodium: 140 mmol/L (ref 135–145)
Total Protein: 5.5 g/dL — ABNORMAL LOW (ref 6.0–8.3)

## 2014-02-08 LAB — CBC
HEMATOCRIT: 34 % — AB (ref 36.0–46.0)
Hemoglobin: 10.7 g/dL — ABNORMAL LOW (ref 12.0–15.0)
MCH: 27.5 pg (ref 26.0–34.0)
MCHC: 31.5 g/dL (ref 30.0–36.0)
MCV: 87.4 fL (ref 78.0–100.0)
PLATELETS: 202 10*3/uL (ref 150–400)
RBC: 3.89 MIL/uL (ref 3.87–5.11)
RDW: 16.8 % — AB (ref 11.5–15.5)
WBC: 23.3 10*3/uL — AB (ref 4.0–10.5)

## 2014-02-08 LAB — MAGNESIUM: MAGNESIUM: 2.2 mg/dL (ref 1.5–2.5)

## 2014-02-08 MED ORDER — ONDANSETRON 4 MG PO TBDP: 4.0000 mg | ORAL_TABLET | Freq: Four times a day (QID) | ORAL | Status: DC | PRN

## 2014-02-08 MED ORDER — TRAMADOL HCL 50 MG PO TABS: 50.0000 mg | ORAL_TABLET | Freq: Four times a day (QID) | ORAL | Status: DC | PRN

## 2014-02-08 MED ORDER — LOPERAMIDE HCL 2 MG PO CAPS: ORAL_CAPSULE | ORAL | Status: DC

## 2014-02-08 MED ORDER — SACCHAROMYCES BOULARDII 250 MG PO CAPS: ORAL_CAPSULE | ORAL | Status: DC

## 2014-02-08 MED ORDER — FERROUS SULFATE 325 (65 FE) MG PO TABS: ORAL_TABLET | ORAL | Status: DC

## 2014-02-08 NOTE — Progress Notes (Addendum)
Physical Therapy Treatment Patient Details Name: April [name] MRN: [id number] DOB: 03-Apr-1925 Today's Date: 02/08/2014    History of Present Illness 81yo female adm 01/24/14 with lower GIB; PMHx: HTN. Dx of colon cancer. Pt is s/p hemicolectomy 01/30/14.     PT Comments    *Pt is progressing well with mobility. She is independent with bed mobility and transfers. She walked 240' with a RW, HR 118, SaO2 99% on RA. Her goal is to return to Tai Chi class and to walk without a RW.  From PT standpoint she is ready to DC back to [facility].**  Follow Up Recommendations  No PT follow up     Equipment Recommendations  None recommended by PT    Recommendations for Other Services       Precautions / Restrictions Precautions Precautions: None Restrictions Weight Bearing Restrictions: No    Mobility  Bed Mobility Overal bed mobility: Modified Independent Bed Mobility: Supine to Sit     Supine to sit: Modified independent (Device/Increase time)     General bed mobility comments: with rail  Transfers Overall transfer level: Modified independent Equipment used: Rolling walker (2 wheeled)             General transfer comment: BUE assist to pushup to stand  Ambulation/Gait   Ambulation Distance (Feet): 240 Feet Assistive device: Rolling walker (2 wheeled) Gait Pattern/deviations: Step-through pattern   Gait velocity interpretation: at or above normal speed for age/gender General Gait Details: no LOB, good sequencing   Stairs            Wheelchair Mobility    Modified Rankin (Stroke Patients Only)       Balance Overall balance assessment: Modified Independent;No apparent balance deficits (not formally assessed)                                  Cognition Arousal/Alertness: Awake/alert Behavior During Therapy: WFL for tasks assessed/performed Overall Cognitive Status: Within Functional Limits for tasks assessed                       Exercises      General Comments        Pertinent Vitals/Pain Pain Assessment: No/denies pain    Home Living                      Prior Function            PT Goals (current goals can now be found in the care plan section) Acute Rehab PT Goals Patient Stated Goal: to return to [place] and eating good food (esp a [employer] hotdog) PT Goal Formulation: With patient Time For Goal Achievement: 02/20/14 Potential to Achieve Goals: Good Progress towards PT goals: Progressing toward goals    Frequency  Min 3X/week    PT Plan Current plan remains appropriate    Co-evaluation             End of Session Equipment Utilized During Treatment: Gait belt Activity Tolerance: Patient tolerated treatment well Patient left: with call bell/phone within reach;in chair     Time: 1052-1110 PT Time Calculation (min) (ACUTE ONLY): 18 min  Charges:  $Gait Training: 8-22 mins                    G Codes:      Philomena Doheny 02/08/2014, 11:16 AM [phone]

## 2014-02-08 NOTE — Progress Notes (Signed)
[facility] PROGRESS NOTE  April [name] IEP:[id number] DOB: [birth year] DOA: 01/24/2014 PCP: Tommy Medal, MD  Brief summary: From 65/75-36/77: 81 year old Caucasian female presented with rectal bleeding. She was transfused PRBCs. CT scan suggested bleeding from angiodysplasia. She was admitted for further management. She she underwent colonoscopy and EGD. Colonic mass was noted along with some abnormal duodenal findings. Biopsies have been obtained. General surgery has been consulted.  1/6-1/12: The patient underwent laproscopic R hemicolectomy on 1/5. A large bleed developed post-operatively and the patient was first transferred to the ICU and later underwent ex-lap with resection of small bowel anastamosis and new ileocolostomy on 1/6. The patient's hgb has since remained stable. She is now slowly tolerating an advancing diet. Therapy has since been consulted with recs for possible rehab at [facility].     Assessment/Plan: Hematochezia secondary to cancer of the ascending colon Pathology demonstrated adenocarcinoma of the colon. Biopsies from upper endoscopy revealed duodenitis without malignancy. She underwent a nuclear scan initially which did identify an area of concern in the mid ascending colon. Angiogram was done however, could not identify the bleeding vessel. Her bleeding subsequently subsided. She underwent EGD and colonoscopy which identified a colonic mass in the proximal colon. Patient underwent Laparoscopic R hemicolectomy on 1/5. Large post-operative bleed noted (see below). Pt then underwent ex-lap with resection of SB anastomosis and new ileocolostomy. Thus far, hgb is holding steady since surgery. Cont to follow cbc and transfuse as needed. Diet advanced per general surgery  Leukocytosis with eosinophilia  WBC continues to trend down. Continue to monitor. Suspect due to the cancer. Pt is afebrile and is not toxic-appearing  Acute blood loss anemia  Noted to have a  large drop in hgb post-operatively. Pt was taken back to OR 01/31/14 and is now s/p ex-lap with resection of small bowel anastamosis and new ileocolostomy. Hgb is stable.   Thickened endometrium/fluid-filled uterus This was incidentally noted on the CT during this admission, as well as a previous CT scan done in September. Dr Earlie Counts had a long discussion with the patient regarding the same. She denied any vaginal bleeding. Initially plan was for outpatient management, but considering a new finding of colonic mass. An ultrasound was done showing fluid-filled uterus. Was recently discussed with GYN, recommend outpatient management for the same. No need for urgent intervention..  E Coli on Urine Culture Not truly symptomatic, but was treated with Keflex. Keflex was stopped due to bowel prep for GI surgery. Continue to observe off of antibiotics for now.  Diarrhea Cdiff negative. Reglan has been discontinued. Patient has been placed on Imodium and colestipol.Clinical improvement.   Code Status: Full Family Communication: Updated patient and daughter at bedside. Disposition Plan:  SNF when medically stable and tolerating oral intake with improvement in diarrhea hopefully tomorrow.  Consultants:  Gastroenterology: Dr. Amedeo Plenty 01/25/2014  Interventional radiology consultation Dr. Geroge Baseman 01/25/2014  Gen. surgery: Dr. Harlow Asa 01/28/2014  Procedures:  Expiratory laparotomy, upper endoscopy, resection of prior and is to [facility] and creation of a new ileocolonic anastomosis 01/31/2014 per Dr. Hassell Done  Laparoscopic-assisted right hemicolectomy 01/30/2014 per Dr. Hassell Done  Colonoscopy 01/28/2014 per Dr. Amedeo Plenty  Upper endoscopy 01/28/2014 per Dr. Amedeo Plenty  Mesenteric angiogram Dr. Geroge Baseman 01/25/2014  Antibiotics:  None  HPI/Subjective: Patient states feeling better. Patient states had large more formed bm today. Patient tolerating current diet.  Objective: Filed Vitals:   02/08/14 1112  BP:    Pulse: 118  Temp:   Resp:     Intake/Output Summary (Last 24 hours)  at 02/08/14 1401 Last data filed at 02/08/14 0819  Gross per 24 hour  Intake    360 ml  Output      0 ml  Net    360 ml   Filed Weights   02/05/14 0204 02/06/14 0328 02/07/14 0500  Weight: 69.8 kg (153 lb 14.1 oz) 65.726 kg (144 lb 14.4 oz) 62.959 kg (138 lb 12.8 oz)    Exam:   General:  NAD  Cardiovascular: RRR  Respiratory: CTAB  Abdomen: Soft, nontender, nondistended, positive bowel sounds.  Musculoskeletal: No clubbing cyanosis or edema.  Data Reviewed: Basic Metabolic Panel:  Recent Labs Lab 02/03/14 0342 02/04/14 0932 02/07/14 0900 02/08/14 0515  NA 141 138 136 140  K 3.8 3.5 3.3* 3.8  CL 107 102 105 105  CO2 28 29 25 26   GLUCOSE 133* 132* 104* 88  BUN <5* <5* 10 9  CREATININE 0.60 0.59 0.60 0.68  CALCIUM 8.0* 8.3* 8.1* 8.5  MG  --   --  1.8 2.2   Liver Function Tests:  Recent Labs Lab 02/07/14 0900 02/08/14 0515  AST 12 14  ALT 13 12  ALKPHOS 53 56  BILITOT 1.2 0.8  PROT 5.8* 5.5*  ALBUMIN 3.2* 3.0*   No results for input(s): LIPASE, AMYLASE in the last 168 hours. No results for input(s): AMMONIA in the last 168 hours. CBC:  Recent Labs Lab 02/04/14 0932 02/05/14 0345 02/06/14 0515 02/07/14 0900 02/08/14 0515  WBC 25.1* 22.7* 20.2* 23.3* 23.3*  NEUTROABS 13.6*  --   --  12.8*  --   HGB 11.0* 10.5* 10.4* 10.8* 10.7*  HCT 34.4* 33.1* 32.7* 34.7* 34.0*  MCV 86.9 87.6 88.6 88.3 87.4  PLT 155 169 174 203 202   Cardiac Enzymes: No results for input(s): CKTOTAL, CKMB, CKMBINDEX, TROPONINI in the last 168 hours. BNP (last 3 results) No results for input(s): PROBNP in the last 8760 hours. CBG: No results for input(s): GLUCAP in the last 168 hours.  Recent Results (from the past 240 hour(s))  Surgical pcr screen     Status: Abnormal   Collection Time: 01/29/14  2:52 PM  Result Value Ref Range Status   MRSA, PCR NEGATIVE NEGATIVE Final   Staphylococcus aureus  POSITIVE (A) NEGATIVE Final    Comment:        The Xpert SA Assay (FDA approved for NASAL specimens in patients over 27 years of age), is one component of a comprehensive surveillance program.  Test performance has been validated by [employer] for patients greater than or equal to 66 year old. It is not intended to diagnose infection nor to guide or monitor treatment.   Clostridium Difficile by PCR     Status: None   Collection Time: 02/04/14  8:58 PM  Result Value Ref Range Status   C difficile by pcr NEGATIVE NEGATIVE Final    Comment: Performed at [facility]     Studies: No results found.  Scheduled Meds: . acetaminophen  1,000 mg Oral TID  . cholecalciferol  4,000 Units Oral QHS  . colestipol  1 g Oral Q12H  . ferrous sulfate  325 mg Oral TID WC  . fluticasone  2 spray Each Nare Daily  . heparin subcutaneous  5,000 Units Subcutaneous 3 times per day  . lip balm  1 application Topical BID  . loperamide  2 mg Oral QHS  . loperamide  2 mg Oral QPC breakfast  . metroNIDAZOLE  1 application Topical q morning -  10a  . multivitamin with minerals  1 tablet Oral Daily  . saccharomyces boulardii  250 mg Oral BID   Continuous Infusions:   Principal Problem:   Colon cancer Active Problems:   hx: breast cancer, DCIS, right, receptor +   Colonic hemorrhage   Thickened endometrium   Acute blood loss anemia   Rectal bleeding   Colonic mass   Lap assisted right hemicolectomy Jan 2016   Hematochezia   Leukocytosis   Diarrhea    Time spent: 35 minutes    [facility] MD [facility] Pager [phone]. If 7PM-7AM, please contact night-coverage at [email or web address], password [facility] 02/08/2014, 2:01 PM  LOS: 15 days

## 2014-02-08 NOTE — Progress Notes (Signed)
8 Days Post-Op  Subjective: She looks good, stool is better and more of a soft stool this AM, not just liquid.  Very anxious about having an accident in the hallways.    Objective: Vital signs in last 24 hours: Temp:  [97.6 F (36.4 C)-98.1 F (36.7 C)] 97.6 F (36.4 C) (01/14 0618) Pulse Rate:  [95-111] 111 (01/14 0620) Resp:  [14-16] 14 (01/14 0618) BP: (123-135)/(56-62) 123/60 mmHg (01/14 0620) SpO2:  [97 %-100 %] 100 % (01/14 0620) Last BM Date: 02/07/14 Sown to 5 stools yesterday Afebrile, VSS Labs stable Intake/Output from previous day: 01/13 0701 - 01/14 0700 In: 240 [P.O.:240] Out: 0  Intake/Output this shift: Total I/O In: 240 [P.O.:240] Out: -   General appearance: alert, cooperative and no distress Resp: clear to auscultation bilaterally GI: soft soreness is much better. +BS, +bm  Lab Results:   Recent Labs  02/07/14 0900 02/08/14 0515  WBC 23.3* 23.3*  HGB 10.8* 10.7*  HCT 34.7* 34.0*  PLT 203 202    BMET  Recent Labs  02/07/14 0900 02/08/14 0515  NA 136 140  K 3.3* 3.8  CL 105 105  CO2 25 26  GLUCOSE 104* 88  BUN 10 9  CREATININE 0.60 0.68  CALCIUM 8.1* 8.5   PT/INR No results for input(s): LABPROT, INR in the last 72 hours.   Recent Labs Lab 02/07/14 0900 02/08/14 0515  AST 12 14  ALT 13 12  ALKPHOS 53 56  BILITOT 1.2 0.8  PROT 5.8* 5.5*  ALBUMIN 3.2* 3.0*     Lipase     Component Value Date/Time   LIPASE 33 05/26/2009 0511     Studies/Results: No results found.  Medications: . acetaminophen  1,000 mg Oral TID  . cholecalciferol  4,000 Units Oral QHS  . colestipol  1 g Oral Q12H  . ferrous sulfate  325 mg Oral TID WC  . fluticasone  2 spray Each Nare Daily  . heparin subcutaneous  5,000 Units Subcutaneous 3 times per day  . lip balm  1 application Topical BID  . loperamide  2 mg Oral QHS  . loperamide  2 mg Oral QPC breakfast  . metroNIDAZOLE  1 application Topical q morning - 10a  . multivitamin with minerals   1 tablet Oral Daily  . saccharomyces boulardii  250 mg Oral BID    Assessment/Plan 1. Right colon mass, likely adenocarcinoma, with recent bleeding INVASIVE ADENOCARCINOMA 2. A)Laparoscopic assisted right hemicolectomy, 01/30/14, Dr. Johnathan Hausen,  Post op bleed requiring transfusion  B) EXPLORATORY LAPAROTOMY, RESECTION OF PRIOR SMALL BOWEL ANASTOMOSIS, UPPER ENDOSCOPY, CREATED A NEW ILEO COLOSTOMY  01/31/14 Dr. Hassell Done. 3. Acute blood loss anemia 4. Post op ileus 5. Possible fluid overload, I don't see a 2D echo or prior stress testing with EF. 4. Leukocytosis with eosinophilia 5. Hypertension 6. Breast cancer Right/DCIS/ER positive PR positive   Plan:  Regular diet, nothing appeals to her now.  abmulate and home in AM.  She is going to assisted living at [facility].     LOS: 15 days    Jessilynn Taft 02/08/2014

## 2014-02-09 DIAGNOSIS — R934 Abnormal findings on diagnostic imaging of urinary organs: Secondary | ICD-10-CM

## 2014-02-09 DIAGNOSIS — R9389 Abnormal findings on diagnostic imaging of other specified body structures: Secondary | ICD-10-CM | POA: Insufficient documentation

## 2014-02-09 LAB — BASIC METABOLIC PANEL
ANION GAP: 6 (ref 5–15)
BUN: 9 mg/dL (ref 6–23)
CALCIUM: 8.3 mg/dL — AB (ref 8.4–10.5)
CO2: 26 mmol/L (ref 19–32)
Chloride: 105 mEq/L (ref 96–112)
Creatinine, Ser: 0.66 mg/dL (ref 0.50–1.10)
GFR calc Af Amer: >90 mL/min (ref >90)
GFR, EST NON AFRICAN AMERICAN: 78 mL/min — AB (ref >90)
Glucose, Bld: 91 mg/dL (ref 70–99)
Potassium: 3.6 mmol/L (ref 3.5–5.1)
Sodium: 137 mmol/L (ref 135–145)

## 2014-02-09 LAB — CBC
HCT: 33.7 % — ABNORMAL LOW (ref 36.0–46.0)
Hemoglobin: 10.7 g/dL — ABNORMAL LOW (ref 12.0–15.0)
MCH: 27.8 pg (ref 26.0–34.0)
MCHC: 31.8 g/dL (ref 30.0–36.0)
MCV: 87.5 fL (ref 78.0–100.0)
Platelets: 209 10*3/uL (ref 150–400)
RBC: 3.85 MIL/uL — AB (ref 3.87–5.11)
RDW: 16.8 % — AB (ref 11.5–15.5)
WBC: 23.4 10*3/uL — ABNORMAL HIGH (ref 4.0–10.5)

## 2014-02-09 MED ORDER — LOPERAMIDE HCL 2 MG PO CAPS: ORAL_CAPSULE | ORAL | Status: DC

## 2014-02-09 MED ORDER — POTASSIUM CHLORIDE CRYS ER 20 MEQ PO TBCR
40.0000 meq | EXTENDED_RELEASE_TABLET | Freq: Once | ORAL | Status: AC
  Administered 2014-02-09: 40 meq via ORAL
  Filled 2014-02-09: qty 2

## 2014-02-09 MED ORDER — TRAMADOL HCL 50 MG PO TABS: 50.0000 mg | ORAL_TABLET | Freq: Four times a day (QID) | ORAL | Status: DC | PRN

## 2014-02-09 NOTE — Clinical Social Work Note (Signed)
Patient for d/c today to SNF bed at [facility]. Family and patient agreeable to this plan- plan transfer via family car. Eduard Clos, MSW, [place]

## 2014-02-09 NOTE — Progress Notes (Signed)
9 Days Post-Op  Subjective: Bowels becoming more formed.  Tolerating diet but gets full fast.  Objective: Vital signs in last 24 hours: Temp:  [97.6 F (36.4 C)-98 F (36.7 C)] 98 F (36.7 C) (01/15 0652) Pulse Rate:  [98-118] 98 (01/15 0652) Resp:  [16-18] 18 (01/15 0652) BP: (123-127)/(54-62) 126/62 mmHg (01/15 0652) SpO2:  [99 %] 99 % (01/15 0652) Last BM Date: 02/08/14  Intake/Output from previous day: 01/14 0701 - 01/15 0700 In: 960 [P.O.:960] Out: 0  Intake/Output this shift: Total I/O In: 240 [P.O.:240] Out: -   PE: General- In NAD Abdomen-soft, incision is clean and intact  Lab Results:   Recent Labs  02/08/14 0515 02/09/14 0526  WBC 23.3* 23.4*  HGB 10.7* 10.7*  HCT 34.0* 33.7*  PLT 202 209   BMET  Recent Labs  02/08/14 0515 02/09/14 0526  NA 140 137  K 3.8 3.6  CL 105 105  CO2 26 26  GLUCOSE 88 91  BUN 9 9  CREATININE 0.68 0.66  CALCIUM 8.5 8.3*   PT/INR No results for input(s): LABPROT, INR in the last 72 hours. Comprehensive Metabolic Panel:    Component Value Date/Time   NA 137 02/09/2014 0526   NA 140 02/08/2014 0515   NA 143 06/28/2013 1117   K 3.6 02/09/2014 0526   K 3.8 02/08/2014 0515   K 3.7 06/28/2013 1117   CL 105 02/09/2014 0526   CL 105 02/08/2014 0515   CO2 26 02/09/2014 0526   CO2 26 02/08/2014 0515   CO2 21* 06/28/2013 1117   BUN 9 02/09/2014 0526   BUN 9 02/08/2014 0515   BUN 12.3 06/28/2013 1117   CREATININE 0.66 02/09/2014 0526   CREATININE 0.68 02/08/2014 0515   CREATININE 0.8 06/28/2013 1117   GLUCOSE 91 02/09/2014 0526   GLUCOSE 88 02/08/2014 0515   GLUCOSE 99 06/28/2013 1117   CALCIUM 8.3* 02/09/2014 0526   CALCIUM 8.5 02/08/2014 0515   CALCIUM 9.7 06/28/2013 1117   AST 14 02/08/2014 0515   AST 12 02/07/2014 0900   AST 16 06/28/2013 1117   ALT 12 02/08/2014 0515   ALT 13 02/07/2014 0900   ALT 12 06/28/2013 1117   ALKPHOS 56 02/08/2014 0515   ALKPHOS 53 02/07/2014 0900   ALKPHOS 67 06/28/2013  1117   BILITOT 0.8 02/08/2014 0515   BILITOT 1.2 02/07/2014 0900   BILITOT 0.54 06/28/2013 1117   PROT 5.5* 02/08/2014 0515   PROT 5.8* 02/07/2014 0900   PROT 7.1 06/28/2013 1117   ALBUMIN 3.0* 02/08/2014 0515   ALBUMIN 3.2* 02/07/2014 0900   ALBUMIN 4.2 06/28/2013 1117     Studies/Results: No results found.  Anti-infectives: Anti-infectives    Start     Dose/Rate Route Frequency Ordered Stop   01/30/14 1500  cefoTEtan (CEFOTAN) 2 g in dextrose 5 % 50 mL IVPB     2 g100 mL/hr over 30 Minutes Intravenous Every 12 hours 01/30/14 1344 01/30/14 1524   01/30/14 0900  cefoTEtan (CEFOTAN) 2 g in dextrose 5 % 50 mL IVPB     2 g100 mL/hr over 30 Minutes Intravenous On call to O.R. 01/29/14 1911 01/30/14 0807   01/29/14 2000  neomycin (MYCIFRADIN) tablet 1,000 mg     1,000 mg Oral Every 6 hours 01/29/14 1911 01/30/14 0737   01/29/14 2000  erythromycin (E-MYCIN) tablet 1,000 mg     1,000 mg Oral Every 6 hours 01/29/14 1911 01/30/14 0737   01/28/14 1700  cephALEXin (KEFLEX) capsule 500 mg  Status:  Discontinued     500 mg Oral 3 times per day 01/28/14 1526 01/29/14 1258   01/25/14 1356  ceFAZolin (ANCEF) 2-3 GM-% IVPB SOLR    Comments:  Margaretmary Dys   : cabinet override      01/25/14 1356 01/25/14 1402      Assessment  Right colon mass, likely adenocarcinoma, with recent bleeding INVASIVE ADENOCARCINOMA      A)Laparoscopic assisted right hemicolectomy, 01/30/14, Dr. Johnathan Hausen,  Post op bleed requiring transfusion  B) EXPLORATORY LAPAROTOMY, RESECTION OF PRIOR SMALL BOWEL ANASTOMOSIS, UPPER ENDOSCOPY, CREATED A NEW ILEO COLOSTOMY  01/31/14 Dr. Hassell Done.    -doing well from a surgical standpoint   Leukocytosis since admission-etiology unclear; does not appear to be infectious    LOS: 16 days   Plan: Discharge when okay with medical service.   Jalecia Leon J 02/09/2014

## 2014-02-09 NOTE — Discharge Instructions (Signed)
ABDOMINAL SURGERY: POST OP INSTRUCTIONS  1. DIET: Follow a light bland diet the first 24 hours after arrival home, such as soup, liquids, crackers, etc.  Be sure to include lots of fluids daily.  Avoid fast food or heavy meals as your are more likely to get nauseated.  Eat a low fat the next few days after surgery.   2. Take your usually prescribed home medications unless otherwise directed. 3. PAIN CONTROL: a. Pain is best controlled by a usual combination of three different methods TOGETHER: i. Ice/Heat ii. Over the counter pain medication iii. Prescription pain medication b. Most patients will experience some swelling and bruising around the incisions.  Ice packs or heating pads (30-60 minutes up to 6 times a day) will help. Use ice for the first few days to help decrease swelling and bruising, then switch to heat to help relax tight/sore spots and speed recovery.  Some people prefer to use ice alone, heat alone, alternating between ice & heat.  Experiment to what works for you.  Swelling and bruising can take several weeks to resolve.   c. It is helpful to take an over-the-counter pain medication regularly for the first few weeks.  Choose one of the following that works best for you: i. Naproxen (Aleve, etc)  Two 228m tabs twice a day ii. Ibuprofen (Advil, etc) Three 2071mtabs four times a day (every meal & bedtime) iii. Acetaminophen (Tylenol, etc) 500-65061mour times a day (every meal & bedtime) d. A  prescription for pain medication (such as oxycodone, hydrocodone, etc) should be given to you upon discharge.  Take your pain medication as prescribed.  i. If you are having problems/concerns with the prescription medicine (does not control pain, nausea, vomiting, rash, itching, etc), please call us [phone] see if we need to switch you to a different pain medicine that will work better for you and/or control your side effect better. ii. If you need a refill on your pain medication,  please contact your pharmacy.  They will contact our office to request authorization. Prescriptions will not be filled after 5 pm or on week-ends. 4. Avoid getting constipated.  Between the surgery and the pain medications, it is common to experience some constipation.  Increasing fluid intake and taking a fiber supplement (such as Metamucil, Citrucel, FiberCon, MiraLax, etc) 1-2 times a day regularly will usually help prevent this problem from occurring.  A mild laxative (prune juice, Milk of Magnesia, MiraLax, etc) should be taken according to package directions if there are no bowel movements after 48 hours.   5. Watch out for diarrhea.  If you have many loose bowel movements, simplify your diet to bland foods & liquids for a few days.  Stop any stool softeners and decrease your fiber supplement.  Switching to mild anti-diarrheal medications (Kayopectate, Pepto Bismol) can help.  If this worsens or does not improve, please call us.[place]. Wash / shower every day.  You may shower over the incision / wound.  Avoid baths until the skin is fully healed.  Continue to shower over incision(s) after the dressing is off. 7. Remove your waterproof bandages 5 days after surgery.  You may leave the incision open to air.  Remove any wicks or ribbons in your wound.  If you have an open wound, please see wound care instructions. You may replace a dressing/Band-Aid to cover the incision for comfort if you wish. 8. ACTIVITIES as tolerated:   a. You may resume regular (light)  daily activities beginning the next day--such as daily self-care, walking, climbing stairs--gradually increasing activities as tolerated.  If you can walk 30 minutes without difficulty, it is safe to try more intense activity such as jogging, treadmill, bicycling, low-impact aerobics, swimming, etc. b. Save the most intensive and strenuous activity for last such as sit-ups, heavy lifting, contact sports, etc  Refrain from any heavy lifting or straining  until you are off narcotics for pain control.   c. DO NOT PUSH THROUGH PAIN.  Let pain be your guide: If it hurts to do something, don't do it.  Pain is your body warning you to avoid that activity for another week until the pain goes down. d. You may drive when you are no longer taking prescription pain medication, you can comfortably wear a seatbelt, and you can safely maneuver your car and apply brakes. e. Dennis Bast may have sexual intercourse when it is comfortable.  9. FOLLOW UP in our office a. Please call [facility] at [phone] to set up an appointment to see your surgeon in the office for a follow-up appointment approximately 1-2 weeks after your surgery. b. Make sure that you call for this appointment the day you arrive home to insure a convenient appointment time. 10. IF YOU HAVE DISABILITY OR FAMILY LEAVE FORMS, BRING THEM TO THE OFFICE FOR PROCESSING.  DO NOT GIVE THEM TO YOUR DOCTOR.   WHEN TO CALL us [phone]: 1. Poor pain control 2. Reactions / problems with new medications (rash/itching, nausea, etc)  3. Fever over 101.5 F (38.5 C) 4. Inability to urinate 5. Nausea and/or vomiting 6. Worsening swelling or bruising 7. Continued bleeding from incision. 8. Increased pain, redness, or drainage from the incision  The clinic staff is available to answer your questions during regular business hours (8:30am-5pm).  Please dont hesitate to call and ask to speak to one of our nurses for clinical concerns.   A surgeon from [facility] is always on call at the hospitals   If you have a medical emergency, go to the nearest emergency room or call 911.    [facility], [place], [place], [place], [place]  [address] ? MAIN: [phone] ? TOLL FREE: [phone] ? FAX [phone] [email or web address]  GETTING TO GOOD BOWEL HEALTH. Irregular bowel habits such as constipation and diarrhea can lead to many problems over time.   Having one soft bowel movement a day is the most important way to prevent further problems.  The anorectal canal is designed to handle stretching and feces to safely manage our ability to get rid of solid waste (feces, poop, stool) out of our body.  BUT, hard constipated stools can act like ripping concrete bricks and diarrhea can be a burning fire to this very sensitive area of our body, causing inflamed hemorrhoids, anal fissures, increasing risk is perirectal abscesses, abdominal pain/bloating, an making irritable bowel worse.     The goal: ONE SOFT BOWEL MOVEMENT A DAY!  To have soft, regular bowel movements:   Drink at least 8 tall glasses of water a day.    Take plenty of fiber.  Fiber is the undigested part of plant food that passes into the colon, acting s natures broom to encourage bowel motility and movement.  Fiber can absorb and hold large amounts of water. This results in a larger, bulkier stool, which is soft and easier to pass. Work gradually over several weeks up to 6 servings a day of fiber (  25g a day even more if needed) in the form of: o Vegetables -- Root (potatoes, carrots, turnips), leafy green (lettuce, salad greens, celery, spinach), or cooked high residue (cabbage, broccoli, etc) o Fruit -- Fresh (unpeeled skin & pulp), Dried (prunes, apricots, cherries, etc ),  or stewed ( applesauce)  o Whole grain breads, pasta, etc (whole wheat)  o Bran cereals   Bulking Agents -- This type of water-retaining fiber generally is easily obtained each day by one of the following:  o Psyllium bran -- The psyllium plant is remarkable because its ground seeds can retain so much water. This product is available as Metamucil, Konsyl, Effersyllium, Per Diem Fiber, or the less expensive generic preparation in drug and health food stores. Although labeled a laxative, it really is not a laxative.  o Methylcellulose -- This is another fiber derived from wood which also retains water. It is available as  Citrucel. o Polyethylene Glycol - and artificial fiber commonly called Miralax or Glycolax.  It is helpful for people with gassy or bloated feelings with regular fiber o Flax Seed - a less gassy fiber than psyllium  No reading or other relaxing activity while on the toilet. If bowel movements take longer than 5 minutes, you are too constipated  AVOID CONSTIPATION.  High fiber and water intake usually takes care of this.  Sometimes a laxative is needed to stimulate more frequent bowel movements, but   Laxatives are not a good long-term solution as it can wear the colon out. o Osmotics (Milk of Magnesia, Fleets phosphosoda, Magnesium citrate, MiraLax, GoLytely) are safer than  o Stimulants (Senokot, Castor Oil, Dulcolax, Ex Lax)    o Do not take laxatives for more than 7days in a row.   IF SEVERELY CONSTIPATED, try a Bowel Retraining Program: o Do not use laxatives.  o Eat a diet high in roughage, such as bran cereals and leafy vegetables.  o Drink six (6) ounces of prune or apricot juice each morning.  o Eat two (2) large servings of stewed fruit each day.  o Take one (1) heaping tablespoon of a psyllium-based bulking agent twice a day. Use sugar-free sweetener when possible to avoid excessive calories.  o Eat a normal breakfast.  o Set aside 15 minutes after breakfast to sit on the toilet, but do not strain to have a bowel movement.  o If you do not have a bowel movement by the third day, use an enema and repeat the above steps.   Controlling diarrhea o Switch to liquids and simpler foods for a few days to avoid stressing your intestines further. o Avoid dairy products (especially milk & ice cream) for a short time.  The intestines often can lose the ability to digest lactose when stressed. o Avoid foods that cause gassiness or bloating.  Typical foods include beans and other legumes, cabbage, broccoli, and dairy foods.  Every person has some sensitivity to other foods, so listen to our  body and avoid those foods that trigger problems for you. o Adding fiber (Citrucel, Metamucil, psyllium, Miralax) gradually can help thicken stools by absorbing excess fluid and retrain the intestines to act more normally.  Slowly increase the dose over a few weeks.  Too much fiber too soon can backfire and cause cramping & bloating. o Probiotics (such as active yogurt, Align, etc) may help repopulate the intestines and colon with normal bacteria and calm down a sensitive digestive tract.  Most studies show it to be of mild help,  though, and such products can be costly. o Medicines: - Bismuth subsalicylate (ex. Kayopectate, Pepto Bismol) every 30 minutes for up to 6 doses can help control diarrhea.  Avoid if pregnant. - Loperamide (Immodium) can slow down diarrhea.  Start with two tablets (4mg  total) first and then try one tablet every 6 hours.  Avoid if you are having fevers or severe pain.  If you are not better or start feeling worse, stop all medicines and call your doctor for advice o Call your doctor if you are getting worse or not better.  Sometimes further testing (cultures, endoscopy, X-ray studies, bloodwork, etc) may be needed to help diagnose and treat the cause of the diarrhea.  Managing Pain  Pain after surgery or related to activity is often due to strain/injury to muscle, tendon, nerves and/or incisions.  This pain is usually short-term and will improve in a few months.   Many people find it helpful to do the following things TOGETHER to help speed the process of healing and to get back to regular activity more quickly:  1. Avoid heavy physical activity a.  no lifting greater than 20 pounds b. Do not push through the pain.  Listen to your body and avoid positions and maneuvers than reproduce the pain c. Walking is okay as tolerated, but go slowly and stop when getting sore.  d. Remember: If it hurts to do it, then dont do it! 2. Take Anti-inflammatory medication  a. Take with  food/snack around the clock for 1-2 weeks i. This helps the muscle and nerve tissues become less irritable and calm down faster b. Choose ONE of the following over-the-counter medications: i. Naproxen 220mg  tabs (ex. Aleve) 1-2 pills twice a day  ii. Ibuprofen 200mg  tabs (ex. Advil, Motrin) 3-4 pills with every meal and just before bedtime iii. Acetaminophen 500mg  tabs (Tylenol) 1-2 pills with every meal and just before bedtime 3. Use a Heating pad or Ice/Cold Pack a. 4-6 times a day b. May use warm bath/hottub  or showers 4. Try Gentle Massage and/or Stretching  a. at the area of pain many times a day b. stop if you feel pain - do not overdo it  Try these steps together to help you body heal faster and avoid making things get worse.  Doing just one of these things may not be enough.    If you are not getting better after two weeks or are noticing you are getting worse, contact our office for further advice; we may need to re-evaluate you & see what other things we can do to help.  Food Choices to Help Relieve Diarrhea When you have diarrhea, the foods you eat and your eating habits are very important. Choosing the right foods and drinks can help relieve diarrhea. Also, because diarrhea can last up to 7 days, you need to replace lost fluids and electrolytes (such as sodium, potassium, and chloride) in order to help prevent dehydration.  WHAT GENERAL GUIDELINES DO I NEED TO FOLLOW?  Slowly drink 1 cup (8 oz) of fluid for each episode of diarrhea. If you are getting enough fluid, your urine will be clear or pale yellow.  Eat starchy foods. Some good choices include white rice, white toast, pasta, low-fiber cereal, baked potatoes (without the skin), saltine crackers, and bagels.  Avoid large servings of any cooked vegetables.  Limit fruit to two servings per day. A serving is  cup or 1 small piece.  Choose foods with less than 2 g of  fiber per serving.  Limit fats to less than 8 tsp (38  g) per day.  Avoid fried foods.  Eat foods that have probiotics in them. Probiotics can be found in certain dairy products.  Avoid foods and beverages that may increase the speed at which food moves through the stomach and intestines (gastrointestinal tract). Things to avoid include:  High-fiber foods, such as dried fruit, raw fruits and vegetables, nuts, seeds, and whole grain foods.  Spicy foods and high-fat foods.  Foods and beverages sweetened with high-fructose corn syrup, honey, or sugar alcohols such as xylitol, sorbitol, and mannitol. WHAT FOODS ARE RECOMMENDED? Grains White rice. White, [place], or pita breads (fresh or toasted), including plain rolls, buns, or bagels. White pasta. Saltine, soda, or graham crackers. Pretzels. Low-fiber cereal. Cooked cereals made with water (such as cornmeal, farina, or cream cereals). Plain muffins. Matzo. Melba toast. Zwieback.  Vegetables Potatoes (without the skin). Strained tomato and vegetable juices. Most well-cooked and canned vegetables without seeds. Tender lettuce. Fruits Cooked or canned applesauce, apricots, cherries, fruit cocktail, grapefruit, peaches, pears, or plums. Fresh bananas, apples without skin, cherries, grapes, cantaloupe, grapefruit, peaches, oranges, or plums.  Meat and Other Protein Products Baked or boiled chicken. Eggs. Tofu. Fish. Seafood. Smooth peanut butter. Ground or well-cooked tender beef, ham, veal, lamb, pork, or poultry.  Dairy Plain yogurt, kefir, and unsweetened liquid yogurt. Lactose-free milk, buttermilk, or soy milk. Plain hard cheese. Beverages Sport drinks. Clear broths. Diluted fruit juices (except prune). Regular, caffeine-free sodas such as ginger ale. Water. Decaffeinated teas. Oral rehydration solutions. Sugar-free beverages not sweetened with sugar alcohols. Other Bouillon, broth, or soups made from recommended foods.  The items listed above may not be a complete list of recommended foods or  beverages. Contact your dietitian for more options. WHAT FOODS ARE NOT RECOMMENDED? Grains Whole grain, whole wheat, bran, or rye breads, rolls, pastas, crackers, and cereals. Wild or brown rice. Cereals that contain more than 2 g of fiber per serving. Corn tortillas or taco shells. Cooked or dry oatmeal. Granola. Popcorn. Vegetables Raw vegetables. Cabbage, broccoli, Brussels sprouts, artichokes, baked beans, beet greens, corn, kale, legumes, peas, sweet potatoes, and yams. Potato skins. Cooked spinach and cabbage. Fruits Dried fruit, including raisins and dates. Raw fruits. Stewed or dried prunes. Fresh apples with skin, apricots, mangoes, pears, raspberries, and strawberries.  Meat and Other Protein Products Chunky peanut butter. Nuts and seeds. Beans and lentils. Berniece Salines.  Dairy High-fat cheeses. Milk, chocolate milk, and beverages made with milk, such as milk shakes. Cream. Ice cream. Sweets and Desserts Sweet rolls, doughnuts, and sweet breads. Pancakes and waffles. Fats and Oils Butter. Cream sauces. Margarine. Salad oils. Plain salad dressings. Olives. Avocados.  Beverages Caffeinated beverages (such as coffee, tea, soda, or energy drinks). Alcoholic beverages. Fruit juices with pulp. Prune juice. Soft drinks sweetened with high-fructose corn syrup or sugar alcohols. Other Coconut. Hot sauce. Chili powder. Mayonnaise. Gravy. Cream-based or milk-based soups.  The items listed above may not be a complete list of foods and beverages to avoid. Contact your dietitian for more information. WHAT SHOULD I DO IF I BECOME DEHYDRATED? Diarrhea can sometimes lead to dehydration. Signs of dehydration include dark urine and dry mouth and skin. If you think you are dehydrated, you should rehydrate with an oral rehydration solution. These solutions can be purchased at pharmacies, retail stores, or online.  Drink -1 cup (120-240 mL) of oral rehydration solution each time you have an episode of diarrhea.  If drinking this amount  makes your diarrhea worse, try drinking smaller amounts more often. For example, drink 1-3 tsp (5-15 mL) every 5-10 minutes.  A general rule for staying hydrated is to drink 1-2 L of fluid per day. Talk to your health care provider about the specific amount you should be drinking each day. Drink enough fluids to keep your urine clear or pale yellow. Document Released: 04/04/2003 Document Revised: 01/17/2013 Document Reviewed: 12/05/2012 [facility] Patient Information 2015 [place], [place]. This information is not intended to replace advice given to you by your health care provider. Make sure you discuss any questions you have with your health care provider.   Chronic Diarrhea Diarrhea is frequent loose and watery bowel movements. It can cause you to feel weak and dehydrated. Dehydration can cause you to become tired and thirsty and to have a dry mouth, decreased urination, and dark yellow urine. Diarrhea is a sign of another problem, most often an infection that will not last long. In most cases, diarrhea lasts 2-3 days. Diarrhea that lasts longer than 4 weeks is called long-lasting (chronic) diarrhea. It is important to treat your diarrhea as directed by your health care provider to lessen or prevent future episodes of diarrhea.  CAUSES  There are many causes of chronic diarrhea. The following are some possible causes:   Gastrointestinal infections caused by viruses, bacteria, or parasites.   Food poisoning or food allergies.   Certain medicines, such as antibiotics, chemotherapy, and laxatives.   Artificial sweeteners and fructose.   Digestive disorders, such as celiac disease and inflammatory bowel diseases.   Irritable bowel syndrome.  Some disorders of the pancreas.  Disorders of the thyroid.  Reduced blood flow to the intestines.  Cancer. Sometimes the cause of chronic diarrhea is unknown. RISK FACTORS  Having a severely weakened immune system, such  as from HIV or AIDS.   Taking certain types of cancer-fighting drugs (such as with chemotherapy) or other medicines.   Having had a recent organ transplant.   Having a portion of the stomach or small bowel removed.   Traveling to countries where food and water supplies are often contaminated.  SYMPTOMS  In addition to frequent, loose stools, diarrhea may cause:   Cramping.   Abdominal pain.   Nausea.   Fever.  Fatigue.  Urgent need to use the bathroom.  Loss of bowel control. DIAGNOSIS  Your health care provider must take a careful history and perform a physical exam. Tests given are based on your symptoms and history. Tests may include:   Blood or stool tests. Three or more stool samples may be examined. Stool cultures may be used to test for bacteria or parasites.   X-rays.   A procedure in which a thin tube is inserted into the mouth or rectum (endoscopy). This allows the health care provider to look inside the intestine.  TREATMENT   Treatment is aimed at correcting the cause of the diarrhea when possible.  Diarrhea caused by an infection can often be treated with antibiotic medicines.  Diarrhea not caused by an infection may require you to take long-term medicine or have surgery. Specific treatment should be discussed with your health care provider.  If the cause cannot be determined, treatment aims to relieve symptoms and prevent dehydration. Serious health problems can occur if you do not maintain proper fluid levels. Treatment may include:  Taking an oral rehydration solution (ORS).  Not drinking beverages that contain caffeine (such as tea, coffee, and soft drinks).  Not drinking alcohol.  Maintaining well-balanced nutrition to help you recover faster. HOME CARE INSTRUCTIONS   Drink enough fluids to keep urine clear or pale yellow. Drink 1 cup (8 oz) of fluid for each diarrhea episode. Avoid fluids that contain simple sugars, fruit juices, whole  milk products, and sodas. Hydrate with an ORS. You may purchase the ORS or prepare it at home by mixing the following ingredients together:   - tsp (1.7-3  mL) table salt.   tsp (3  mL) baking soda.   tsp (1.7 mL) salt substitute containing potassium chloride.  1 tbsp (20 mL) sugar.  4.2 c (1 L) of water.   Certain foods and beverages may increase the speed at which food moves through the gastrointestinal (GI) tract. These foods and beverages should be avoided. They include:  Caffeinated and alcoholic beverages.  High-fiber foods, such as raw fruits and vegetables, nuts, seeds, and whole grain breads and cereals.  Foods and beverages sweetened with sugar alcohols, such as xylitol, sorbitol, and mannitol.   Some foods may be well tolerated and may help thicken stool. These include:  Starchy foods, such as rice, toast, pasta, low-sugar cereal, oatmeal, grits, baked potatoes, crackers, and bagels.  Bananas.  Applesauce.  Add probiotic-rich foods to help increase healthy bacteria in the GI tract. These include yogurt and fermented milk products.  Wash your hands well after each diarrhea episode.  Only take over-the-counter or prescription medicines as directed by your health care provider.  Take a warm bath to relieve any burning or pain from frequent diarrhea episodes. SEEK MEDICAL CARE IF:   You are not urinating as often.  Your urine is a dark color.  You become very tired or dizzy.  You have severe pain in the abdomen or rectum.  Your have blood or pus in your stools.  Your stools look black and tarry. SEEK IMMEDIATE MEDICAL CARE IF:   You are unable to keep fluids down.  You have persistent vomiting.  You have blood in your stool.  Your stools are black and tarry.  You do not urinate in 6-8 hours, or there is only a small amount of very dark urine.  You have abdominal pain that increases or localizes.  You have weakness, dizziness, confusion, or  lightheadedness.  You have a severe headache.  Your diarrhea gets worse or does not get better.  You have a fever or persistent symptoms for more than 2-3 days.  You have a fever and your symptoms suddenly get worse. MAKE SURE YOU:   Understand these instructions.  Will watch your condition.  Will get help right away if you are not doing well or get worse. Document Released: 04/04/2003 Document Revised: 01/17/2013 Document Reviewed: 07/07/2012 [facility] Patient Information 2015 [place], [place]. This information is not intended to replace advice given to you by your health care provider. Make sure you discuss any questions you have with your health care provider.

## 2014-02-09 NOTE — Discharge Summary (Signed)
Physician Discharge Summary  April [name]:[id number] DOB: 1925-05-03 DOA: 01/24/2014  PCP: Tommy Medal, MD  Admit date: 01/24/2014 Discharge date: 02/09/2014  Time spent: 70 minutes  Recommendations for Outpatient Follow-up:  1. Follow-up with Dr. Hassell Done in 2 weeks. 2. Patient is scheduled to follow-up with Dr. Jana Hakim in 1-2 weeks for further management of her colonic cancer and further recommendations.  3.  follow-up with PANG,RICHARD, MD in 1-2 weeks. On follow patient in need a CBC with differential to follow-up on leukocytosis and hemoglobin. Patient will also need a basic metabolic profile done to follow-up on electrolytes and renal function.  Discharge Diagnoses:  Principal Problem:   Colon cancer Active Problems:   hx: breast cancer, DCIS, right, receptor +   Colonic hemorrhage   Thickened endometrium   Acute blood loss anemia   Rectal bleeding   Colonic mass   Lap assisted right hemicolectomy Jan 2016   Hematochezia   Leukocytosis   Diarrhea   Discharge Condition: Stable and improved  Diet recommendation: Regular  Filed Weights   02/05/14 0204 02/06/14 0328 02/07/14 0500  Weight: 69.8 kg (153 lb 14.1 oz) 65.726 kg (144 lb 14.4 oz) 62.959 kg (138 lb 12.8 oz)    History of present illness:  April [name] is a 81 y.o. female   has a past medical history of Hypertension; History of breast cancer (2011); Senile osteoporosis; and Cancer.   Presented with  Red blood stools since 3 PM since her arrival to ER she have had about 10-15 bloody BM's about hand full each time. Patient states that the frequency of the bloody BM's is increasing. She reports some diarrhea for the past few months though to be a result of cholecystectomy no abdominal pain, denies any chest pain, shortness of breath or lightheadedness. For the past 1 hour she has been feeling light headed when standing up. Patient reports taking iron and have been having some dark stools in the past.  Patient is actively followed by oncology for history of stage 0 breast cancer which was treated by lumpectomy. She is currently not on any anticoagulation reports a last colonoscopy 4 years ago in 2011 and was unremarkable. She is followed by Dr. Paulita Fujita In ER she was noted to have elevated WBC up to 23 but family reports hx of increased WBC for the past 1 year mainly with increased eosinophils. CT scan of the abdomen was ordered showing Active hemorrhage into the right colon, suggesting angiodysplasia. And Abnormal endometrial cavity which could reflect neoplasia. Regarding colonic hemorrhage ER have spoken to Dr. Paulita Fujita who recommends admission and he will see her in the consult tomorrow. If patient decompensates radiology consult for angiogram and embolization. At this point patient is stable.  She has been having some chronic cough that has gotten worse, no fever or chills.    Hospitalist was called for admission for colonic hemorhage   Hospital Course:  Brief summary: From 29/40-36/27: 81 year old Caucasian female presented with rectal bleeding. She was transfused PRBCs. CT scan suggested bleeding from angiodysplasia. She was admitted for further management. She she underwent colonoscopy and EGD. Colonic mass was noted along with some abnormal duodenal findings. Biopsies have been obtained. General surgery has been consulted.  1/6-1/12: The patient underwent laproscopic R hemicolectomy on 1/5. A large bleed developed post-operatively and the patient was first transferred to the ICU and later underwent ex-lap with resection of small bowel anastamosis and new ileocolostomy on 1/6. The patient's hgb has since remained stable. Patient's diet  was slowly advanced which she tolerated. Therapy has since been consulted with recs for possible rehab at [facility].   Hematochezia secondary to cancer of the ascending colon Pathology demonstrated adenocarcinoma of the colon. Biopsies from upper endoscopy  revealed duodenitis without malignancy. She underwent a nuclear scan initially which did identify an area of concern in the mid ascending colon. Angiogram was done however, could not identify the bleeding vessel. Her bleeding subsequently subsided. She underwent EGD and colonoscopy which identified a colonic mass in the proximal colon. Patient underwent Laparoscopic R hemicolectomy on 1/5. Large post-operative bleed noted (see below). Pt then underwent ex-lap with resection of SB anastomosis and new ileocolostomy. Patient received a total of 11 units of packed red blood cells during the hospitalization, 4 units of FFP 1 unit of platelets. Patient's hemoglobin stabilized since surgery. Patient did not have any further bleeding. Patient's diet was advanced and she was tolerating a regular diet by day of discharge. On day of discharge patient's hemoglobin was 10.7.   Leukocytosis with eosinophilia  WBC continues to trend down. Continue to monitor. Suspect due to the cancer. Pt remained afebrile and is not toxic-appearing. Patient received a few days of Keflex for UTI. Patient's white count has remained stable and unlikely secondary to infection. This will need to be followed up as outpatient.  Acute blood loss anemia  Noted to have a large drop in hgb post-operatively. Patient's hemoglobin dropped as low as 5.6. Pt was taken back to OR 01/31/14 and is now s/p ex-lap with resection of small bowel anastamosis and new ileocolostomy. Patient was transfused a total of 11 units of packed red blood cells during this hospitalization. Patient's hemoglobin remained stable patient be discharged in stable condition.   Thickened endometrium/fluid-filled uterus This was incidentally noted on the CT during this admission, as well as a previous CT scan done in September. Dr Earlie Counts had a long discussion with the patient regarding the same. She denied any vaginal bleeding. Initially plan was for outpatient management, but  considering a new finding of colonic mass. An ultrasound was done showing fluid-filled uterus. Was recently discussed with GYN, recommend outpatient management for the same. No need for urgent intervention..  E Coli on Urine Culture Not truly symptomatic, but was treated with Keflex. Keflex was stopped due to bowel prep for GI surgery. Patient remained asymptomatic.  Diarrhea Cdiff negative. Reglan was discontinued. Patient has been placed on Imodium and colestipol, with improvement with consistency of stool.    Procedures:  Expiratory laparotomy, upper endoscopy, resection of prior and is to [facility] and creation of a new ileocolonic anastomosis 01/31/2014 per Dr. Hassell Done  Laparoscopic-assisted right hemicolectomy 01/30/2014 per Dr. Hassell Done  Colonoscopy 01/28/2014 per Dr. Amedeo Plenty  Upper endoscopy 01/28/2014 per Dr. Amedeo Plenty  Mesenteric angiogram Dr. Geroge Baseman 01/25/2014  Transfused 11 units packed red blood cells   transfused 4 units of FFP  Transfused a unit of platelets    Consultations:  Gastroenterology: Dr. Amedeo Plenty 01/25/2014  Interventional radiology consultation Dr. Geroge Baseman 01/25/2014  Gen. surgery: Dr. Harlow Asa 01/28/2014  Discharge Exam: Filed Vitals:   02/09/14 0652  BP: 126/62  Pulse: 98  Temp: 98 F (36.7 C)  Resp: 18    General: NAD Cardiovascular: RRR Respiratory: CTAB  Discharge Instructions   Discharge Instructions    Diet general    Complete by:  As directed      Discharge instructions    Complete by:  As directed   Follow up with Dr Jana Hakim in 1-2 weeks. Follow  up with Dr Hassell Done in 2 weeks. Follow up with Dr Minna Antis in 2 weeks.     Increase activity slowly    Complete by:  As directed           Current Discharge Medication List    START taking these medications   Details  loperamide (IMODIUM) 2 MG capsule Take one tablet twice a day as needed to maintain a soft smooth bowel movement.  You can adjust this as needed.  You can but this over  the counter. Qty: 60 capsule, Refills: 0    ondansetron (ZOFRAN-ODT) 4 MG disintegrating tablet Take 1 tablet (4 mg total) by mouth every 6 (six) hours as needed for nausea or vomiting. Qty: 20 tablet, Refills: 0    saccharomyces boulardii (FLORASTOR) 250 MG capsule Take one twice a day till you see Dr. Hassell Done and ask about continuing them.  You can buy this over the counter.    traMADol (ULTRAM) 50 MG tablet Take 1-2 tablets (50-100 mg total) by mouth every 6 (six) hours as needed for moderate pain or severe pain. Qty: 20 tablet, Refills: 0      CONTINUE these medications which have CHANGED   Details  ferrous sulfate 325 (65 FE) MG tablet Take one after meals three times a day.  You can buy this over the counter. Refills: 3      CONTINUE these medications which have NOT CHANGED   Details  acetaminophen (TYLENOL) 500 MG tablet Take 500 mg by mouth every 6 (six) hours as needed for pain (pain).    calcium carbonate (OS-CAL) 600 MG TABS Take 600 mg by mouth daily.     Cetirizine HCl (ZYRTEC PO) Take 1 tablet by mouth at bedtime.     cholecalciferol (VITAMIN D) 1000 UNITS tablet Take 4,000 Units by mouth at bedtime.     colestipol (MICRONIZED COLESTIPOL HCL) 1 G tablet Take 1 g by mouth 2 (two) times daily.    Multiple Vitamins-Minerals (MULTIVITAMIN WITH MINERALS) tablet Take 1 tablet by mouth daily.    metroNIDAZOLE (METROGEL) 0.75 % gel Apply 1 application topically every morning.    mometasone (NASONEX) 50 MCG/ACT nasal spray Place 2 sprays into the nose daily.        STOP taking these medications     Diphenoxylate-Atropine (LOMOTIL PO)      magnesium gluconate (MAGONATE) 500 MG tablet      Wheat Dextrin (BENEFIBER PO)        Allergies  Allergen Reactions  . Ativan [Lorazepam] Other (See Comments)    confusion  . Codeine Nausea And Vomiting  . Sulfa Antibiotics    Follow-up Information    Follow up with Johnathan Hausen B, MD. Schedule an appointment as soon as  possible for a visit in 2 weeks.   Specialty:  General Surgery   Contact information:   [address] [place] [address] [phone]       Follow up with Tommy Medal, MD.   Specialty:  Internal Medicine   Why:  Call and make an appointment for 1-2 weeks so he can follow up on your medical issuse and loose stools.   Contact information:   [place], [place] [address] [phone]        The results of significant diagnostics from this hospitalization (including imaging, microbiology, ancillary and laboratory) are listed below for reference.    Significant Diagnostic Studies: Nm Gi Blood Loss  01/25/2014   CLINICAL DATA:  Multiple  bloody stools with the last episode at 8:30 a.m. today.  EXAM: NUCLEAR MEDICINE GASTROINTESTINAL BLEEDING SCAN  TECHNIQUE: Sequential abdominal images were obtained following intravenous administration of Tc-28m labeled red blood cells.  RADIOPHARMACEUTICALS:  Twenty-one mCi Tc-46m in-vitro labeled red cells.  COMPARISON:  None.  FINDINGS: Following injection of the radiopharmaceutical there is normal expected activity in the abdominal aorta and iliac and femoral vessels as well as the liver and spleen. There is a subtle focus of persistent increased activity in the right mid abdomen laterally. This appears to correspond to the mid ascending colon. This does not migrate initially but over time the activity becomes more intense and there is radio- tracer migration into the transverse colon and proximal descending colon.  IMPRESSION: Active gastrointestinal bleeding focus in the mid ascending colon. This is consistent with last night's CT findings.  These results were called by telephone at the time of interpretation on 01/25/2014 at 12:57 pm to Amada Jupiter, RN, who verbally acknowledged these results.   Electronically Signed   By: David  Martinique   On: 01/25/2014 12:59   US Transvaginal Non-ob  01/29/2014   CLINICAL DATA:   Postmenopausal endometrial thickening seen on recent CT.  EXAM: TRANSABDOMINAL AND TRANSVAGINAL ULTRASOUND OF PELVIS  TECHNIQUE: Both transabdominal and transvaginal ultrasound examinations of the pelvis were performed. Transabdominal technique was performed for global imaging of the pelvis including uterus, ovaries, adnexal regions, and pelvic cul-de-sac. It was necessary to proceed with endovaginal exam following the transabdominal exam to visualize the endometrium and ovaries.  COMPARISON:  CT on 01/24/2014  FINDINGS: Uterus  Measurements: 6.4 x 3.0 x 4.3 cm. No myometrial masses identified. No definite cervical mass visualized.  Endometrium  Thickness: The endometrial cavity is diffusely distended with complex hypoechoic fluid, consistent with hydrometros. Endometrial thickness cannot be measured, however no focal endometrial mass visualized.  Right ovary  Measurements: Not directly visualized by transabdominal or transvaginal sonography, however no adnexal mass identified.  Left ovary  Measurements: Not directly visualized by transabdominal or transvaginal sonography, however no adnexal mass identified.  Other findings  No free fluid.  IMPRESSION: Distended fluid-filled endometrial cavity, consistent with hydrometros. No mass or other etiology apparent by ultrasound. Consider GYN consultation; pelvic MRI without and with contrast may also be helpful for further evaluation if patient can cooperate with breath holding instructions.  Nonvisualization of ovaries, however no adnexal mass identified.   Electronically Signed   By: Earle Gell M.D.   On: 01/29/2014 15:10   US Pelvis Complete  01/29/2014   CLINICAL DATA:  Postmenopausal endometrial thickening seen on recent CT.  EXAM: TRANSABDOMINAL AND TRANSVAGINAL ULTRASOUND OF PELVIS  TECHNIQUE: Both transabdominal and transvaginal ultrasound examinations of the pelvis were performed. Transabdominal technique was performed for global imaging of the pelvis including  uterus, ovaries, adnexal regions, and pelvic cul-de-sac. It was necessary to proceed with endovaginal exam following the transabdominal exam to visualize the endometrium and ovaries.  COMPARISON:  CT on 01/24/2014  FINDINGS: Uterus  Measurements: 6.4 x 3.0 x 4.3 cm. No myometrial masses identified. No definite cervical mass visualized.  Endometrium  Thickness: The endometrial cavity is diffusely distended with complex hypoechoic fluid, consistent with hydrometros. Endometrial thickness cannot be measured, however no focal endometrial mass visualized.  Right ovary  Measurements: Not directly visualized by transabdominal or transvaginal sonography, however no adnexal mass identified.  Left ovary  Measurements: Not directly visualized by transabdominal or transvaginal sonography, however no adnexal mass identified.  Other findings  No free fluid.  IMPRESSION: Distended fluid-filled endometrial cavity, consistent with hydrometros. No mass or other etiology apparent by ultrasound. Consider GYN consultation; pelvic MRI without and with contrast may also be helpful for further evaluation if patient can cooperate with breath holding instructions.  Nonvisualization of ovaries, however no adnexal mass identified.   Electronically Signed   By: Earle Gell M.D.   On: 01/29/2014 15:10   Ct Abdomen Pelvis W Contrast  01/24/2014   CLINICAL DATA:  Hematochezia.  EXAM: CT ABDOMEN AND PELVIS WITH CONTRAST  TECHNIQUE: Multidetector CT imaging of the abdomen and pelvis was performed using the standard protocol following bolus administration of intravenous contrast.  CONTRAST:  135mL OMNIPAQUE IOHEXOL 300 MG/ML SOLN, 4mL OMNIPAQUE IOHEXOL 300 MG/ML SOLN  COMPARISON:  10/25/2013  FINDINGS: BODY WALL: Unremarkable.  LOWER CHEST: Unremarkable.  ABDOMEN/PELVIS:  Liver: No focal abnormality.  Biliary: Cholecystectomy. No evidence of biliary of obstruction or calculus.  Pancreas: Unremarkable.  Spleen: Unremarkable.  Adrenals:  Unremarkable.  Kidneys and ureters: No hydronephrosis.  Left renal sinus cysts.  Bladder: Unremarkable.  Reproductive: As noted on the previous study, there is low-density expansion of the atrophic uterus. The endometrial cavity or internal fluid measures 21 mm in AP dimension. This could be from endometrial neoplasia or cervical stenosis with trapped fluid. Ultrasound has been previously recommended. The ovaries are negative.  Bowel: There is high-density material within the lumen of the proximal colon. Oral contrast has not yet reached this segment of bowel, and the finding is compatible with hemorrhage into the lumen, in this location usually from angiodysplasia. The volume increases on delayed imaging. Colonic diverticulosis present in the sigmoid region, which could also explain hematochezia. Increased fluid content of bowel which could reflect diarrhea related to the GI bleed. Negative appendix. In the region of high-density material, there is questionable thickening of the wall, but a mass lesion cannot be called in the setting of diffuse stool/fluid. No mesenteric adenopathy.  Retroperitoneum: No mass or adenopathy.  Peritoneum: No ascites or pneumoperitoneum.  Vascular: No acute abnormality.  OSSEOUS: Facet arthropathy with L4-5 slip. Remote L1 inferior endplate fracture. This numbering assumes a transitional lumbosacral vertebra as S1.  These results were called by telephone at the time of interpretation on 01/24/2014 at 10:40 pm to Dr. Montine Circle , who verbally acknowledged these results.  IMPRESSION: 1. Active hemorrhage into the right colon, suggesting angiodysplasia. 2. Colonic diverticulosis. 3. Abnormal endometrial cavity which could reflect neoplasia. Sonography has been previously recommended.   Electronically Signed   By: Jorje Guild M.D.   On: 01/24/2014 22:40   Ir Angiogram Visceral Selective  01/25/2014   CLINICAL DATA:  81 year old female with the lower GI bleed. CT of the abdomen  and pelvis with contrast on 01/24/2014 demonstrated findings suggestive of extravasation of contrast material in the ascending colon. Patient was treated conservatively and did well until developing recurrent GI bleeding last night. A tagged nuclear medicine red blood cell study was positive for bleeding in the mid ascending colon. The patient now presents to Interventional Radiology for catheter angiography and possible embolization if the bleeding source is identified.  EXAM: SELECTIVE VISCERAL ARTERIOGRAPHY; ADDITIONAL ARTERIOGRAPHY; IR ULTRASOUND GUIDANCE VASC ACCESS RIGHT  Date: 01/25/2014  PROCEDURE: 1. Ultrasound-guided puncture of the right common femoral artery 2. Catheterization of the superior mesenteric artery with arteriogram 3. Catheterization of the ileal colloid artery with arteriogram 4. Catheterization of the inferior mesenteric artery with arteriogram 5. Limited right common femoral arteriogram 6. Arterial closure with [place] Interventional Radiologist:  Criselda Peaches, MD  ANESTHESIA/SEDATION: Moderate (conscious) sedation was used. 4.5 mg Versed, 125 mcg Fentanyl were administered intravenously. The patient's vital signs were monitored continuously by radiology nursing throughout the procedure.  Sedation Time: 34 minutes  MEDICATIONS: 2 g Ancef administered intravenously  FLUOROSCOPY TIME:  4 min 18 seconds 391 mGy  CONTRAST:  121mL OMNIPAQUE IOHEXOL 300 MG/ML  SOLN  TECHNIQUE: Informed consent was obtained from the patient following explanation of the procedure, risks, benefits and alternatives. The patient understands, agrees and consents for the procedure. All questions were addressed. A time out was performed.  Maximal barrier sterile technique utilized including caps, mask, sterile gowns, sterile gloves, large sterile drape, hand hygiene, and Betadine skin prep.  The right groin was interrogated with ultrasound. The patient has a high common femoral bifurcation. An image was  obtained and stored for the medical record. Local anesthesia was attained by infiltration with 1% lidocaine. A small dermatotomy was made. Under real-time sonographic guidance, the common femoral artery was punctured just above the bifurcation. The 3 [place] inner portion of the a micropuncture sheath was advanced over the micro wire and a right common femoral arteriogram was performed confirming that the arterial puncture was below the inferior genu of the inferior epigastric artery and therefore below the inguinal ligament.  The micro sheath was then used to exchange the micro wire for a 0.035 Bentson wire. A C2 cobra catheter was advanced into the abdominal aorta an used to select the superior mesenteric artery. A superior mesenteric arteriogram was performed. There is a marked hypertrophy of the inferior pancreaticoduodenal arcade as well as a direct communication between the inferior pancreaticoduodenal arcade and the splenic artery consistent with an arc of Buhler. There is retrograde filling of the branches of the celiac artery. There is a high-grade stenosis versus occlusion at the origin of the celiac artery.  A repeat arteriography was performed to include the inferior branches. There is no definite evidence of active extravasation. There is some increased density in the lateral margin of the ascending colon in the region of the recent bleeding. Therefore, the C2 catheter was advanced over a glidewire and positioned in the common trunk of the ileocolic and right gastric arteries. Repeat arteriography was performed in multiple obliquities which demonstrated in the density to represent overlapping of vessels. Despite multiple angiographic runs, no evidence of bleeding or irregularity was identified.  The C2 catheter was removed over a wire and a RIM catheter was introduced. The rim was used to select the inferior mesenteric artery and inferior mesenteric arteriogram was performed. No evidence of bleeding or  vascular abnormality.  Hemostasis stress then the catheter was removed. Hemostasis was obtained with the assistance of a Cordis ExoSeal extra arterial collagen plug.  COMPLICATIONS: None  IMPRESSION: 1. Mesenteric arteriography is a negative for evidence of active bleeding or focal vascular abnormality that would suggest a source for bleeding. 2. 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 3. 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  PLAN: If the patient again developed significant acute lower GI bleeding resulting in hemodynamic changes (tachycardia or hypotension) repeat arteriography may be considered in an effort to identify and embolize the intermittent bleeding source.  In the setting of persistent intermittent low volume bleeding, the without hemodynamic changes, repeat arteriography is unlikely to be of benefit.  Signed,  Criselda Peaches,  MD  [facility]  [facility]   Electronically Signed   By: Jacqulynn Cadet M.D.   On: 01/25/2014 18:08   Ir Angiogram Visceral Selective  01/25/2014   CLINICAL DATA:  81 year old female with the lower GI bleed. CT of the abdomen and pelvis with contrast on 01/24/2014 demonstrated findings suggestive of extravasation of contrast material in the ascending colon. Patient was treated conservatively and did well until developing recurrent GI bleeding last night. A tagged nuclear medicine red blood cell study was positive for bleeding in the mid ascending colon. The patient now presents to Interventional Radiology for catheter angiography and possible embolization if the bleeding source is identified.  EXAM: SELECTIVE VISCERAL ARTERIOGRAPHY; ADDITIONAL ARTERIOGRAPHY; IR ULTRASOUND GUIDANCE VASC ACCESS RIGHT  Date: 01/25/2014  PROCEDURE: 1.  Ultrasound-guided puncture of the right common femoral artery 2. Catheterization of the superior mesenteric artery with arteriogram 3. Catheterization of the ileal colloid artery with arteriogram 4. Catheterization of the inferior mesenteric artery with arteriogram 5. Limited right common femoral arteriogram 6. Arterial closure with Cordis ExoSeal Interventional Radiologist:  Criselda Peaches, MD  ANESTHESIA/SEDATION: Moderate (conscious) sedation was used. 4.5 mg Versed, 125 mcg Fentanyl were administered intravenously. The patient's vital signs were monitored continuously by radiology nursing throughout the procedure.  Sedation Time: 34 minutes  MEDICATIONS: 2 g Ancef administered intravenously  FLUOROSCOPY TIME:  4 min 18 seconds 391 mGy  CONTRAST:  175mL OMNIPAQUE IOHEXOL 300 MG/ML  SOLN  TECHNIQUE: Informed consent was obtained from the patient following explanation of the procedure, risks, benefits and alternatives. The patient understands, agrees and consents for the procedure. All questions were addressed. A time out was performed.  Maximal barrier sterile technique utilized including caps, mask, sterile gowns, sterile gloves, large sterile drape, hand hygiene, and Betadine skin prep.  The right groin was interrogated with ultrasound. The patient has a high common femoral bifurcation. An image was obtained and stored for the medical record. Local anesthesia was attained by infiltration with 1% lidocaine. A small dermatotomy was made. Under real-time sonographic guidance, the common femoral artery was punctured just above the bifurcation. The 3 [place] inner portion of the a micropuncture sheath was advanced over the micro wire and a right common femoral arteriogram was performed confirming that the arterial puncture was below the inferior genu of the inferior epigastric artery and therefore below the inguinal ligament.  The micro sheath was then used to exchange the micro wire for a 0.035 Bentson wire. A  C2 cobra catheter was advanced into the abdominal aorta an used to select the superior mesenteric artery. A superior mesenteric arteriogram was performed. There is a marked hypertrophy of the inferior pancreaticoduodenal arcade as well as a direct communication between the inferior pancreaticoduodenal arcade and the splenic artery consistent with an arc of Buhler. There is retrograde filling of the branches of the celiac artery. There is a high-grade stenosis versus occlusion at the origin of the celiac artery.  A repeat arteriography was performed to include the inferior branches. There is no definite evidence of active extravasation. There is some increased density in the lateral margin of the ascending colon in the region of the recent bleeding. Therefore, the C2 catheter was advanced over a glidewire and positioned in the common trunk of the ileocolic and right gastric arteries. Repeat arteriography was performed in multiple obliquities which demonstrated in the density to represent overlapping of vessels. Despite multiple angiographic runs, no evidence of bleeding or irregularity was identified.  The  C2 catheter was removed over a wire and a RIM catheter was introduced. The rim was used to select the inferior mesenteric artery and inferior mesenteric arteriogram was performed. No evidence of bleeding or vascular abnormality.  Hemostasis stress then the catheter was removed. Hemostasis was obtained with the assistance of a Cordis ExoSeal extra arterial collagen plug.  COMPLICATIONS: None  IMPRESSION: 1. Mesenteric arteriography is a negative for evidence of active bleeding or focal vascular abnormality that would suggest a source for bleeding. 2. 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 3. 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  PLAN: If the patient again developed significant acute lower GI bleeding resulting in hemodynamic changes (tachycardia or hypotension) repeat arteriography may be considered in an effort to identify and embolize the intermittent bleeding source.  In the setting of persistent intermittent low volume bleeding, the without hemodynamic changes, repeat arteriography is unlikely to be of benefit.  Signed,  Criselda Peaches, MD  [facility]  [facility]   Electronically Signed   By: Jacqulynn Cadet M.D.   On: 01/25/2014 18:08   Ir Angiogram Selective Each Additional Vessel  01/25/2014   CLINICAL DATA:  81 year old female with the lower GI bleed. CT of the abdomen and pelvis with contrast on 01/24/2014 demonstrated findings suggestive of extravasation of contrast material in the ascending colon. Patient was treated conservatively and did well until developing recurrent GI bleeding last night. A tagged nuclear medicine red blood cell study was positive for bleeding in the mid ascending colon. The patient now presents to Interventional Radiology for catheter angiography and possible embolization if the bleeding source is identified.  EXAM: SELECTIVE VISCERAL ARTERIOGRAPHY; ADDITIONAL ARTERIOGRAPHY; IR ULTRASOUND GUIDANCE VASC ACCESS RIGHT  Date: 01/25/2014  PROCEDURE: 1. Ultrasound-guided puncture of the right common femoral artery 2. Catheterization of the superior mesenteric artery with arteriogram 3. Catheterization of the ileal colloid artery with arteriogram 4. Catheterization of the inferior mesenteric artery with arteriogram 5. Limited right common femoral arteriogram 6. Arterial closure with Cordis ExoSeal Interventional Radiologist:  Criselda Peaches, MD  ANESTHESIA/SEDATION: Moderate (conscious) sedation was used. 4.5 mg Versed, 125 mcg Fentanyl were administered intravenously. The patient's vital signs were monitored continuously by radiology  nursing throughout the procedure.  Sedation Time: 34 minutes  MEDICATIONS: 2 g Ancef administered intravenously  FLUOROSCOPY TIME:  4 min 18 seconds 391 mGy  CONTRAST:  190mL OMNIPAQUE IOHEXOL 300 MG/ML  SOLN  TECHNIQUE: Informed consent was obtained from the patient following explanation of the procedure, risks, benefits and alternatives. The patient understands, agrees and consents for the procedure. All questions were addressed. A time out was performed.  Maximal barrier sterile technique utilized including caps, mask, sterile gowns, sterile gloves, large sterile drape, hand hygiene, and Betadine skin prep.  The right groin was interrogated with ultrasound. The patient has a high common femoral bifurcation. An image was obtained and stored for the medical record. Local anesthesia was attained by infiltration with 1% lidocaine. A small dermatotomy was made. Under real-time sonographic guidance, the common femoral artery was punctured just above the bifurcation. The 3 [place] inner portion of the a micropuncture sheath was advanced over the micro wire and a right common femoral arteriogram was performed confirming that the arterial puncture was below the inferior genu of the inferior epigastric artery and therefore below the inguinal ligament.  The micro sheath was then used to exchange the micro wire for a 0.035 Bentson wire. A C2 cobra catheter was advanced into the abdominal aorta an used to select the superior mesenteric artery. A superior mesenteric arteriogram was performed. There is a marked hypertrophy of the inferior pancreaticoduodenal arcade as well as a direct communication between the inferior pancreaticoduodenal arcade and the splenic artery consistent with an arc of Buhler. There is retrograde filling of the branches of the celiac artery. There is a high-grade stenosis versus occlusion at the origin of the celiac artery.  A repeat arteriography was performed to include the inferior branches. There is  no definite evidence of active extravasation. There is some increased density in the lateral margin of the ascending colon in the region of the recent bleeding. Therefore, the C2 catheter was advanced over a glidewire and positioned in the common trunk of the ileocolic and right gastric arteries. Repeat arteriography was performed in multiple obliquities which demonstrated in the density to represent overlapping of vessels. Despite multiple angiographic runs, no evidence of bleeding or irregularity was identified.  The C2 catheter was removed over a wire and a RIM catheter was introduced. The rim was used to select the inferior mesenteric artery and inferior mesenteric arteriogram was performed. No evidence of bleeding or vascular abnormality.  Hemostasis stress then the catheter was removed. Hemostasis was obtained with the assistance of a Cordis ExoSeal extra arterial collagen plug.  COMPLICATIONS: None  IMPRESSION: 1. Mesenteric arteriography is a negative for evidence of active bleeding or focal vascular abnormality that would suggest a source for bleeding. 2. 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 3. 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  PLAN: If the patient again developed significant acute lower GI bleeding resulting in hemodynamic changes (tachycardia or hypotension) repeat arteriography may be considered in an effort to identify and embolize the intermittent bleeding source.  In the setting of persistent intermittent low volume bleeding, the without hemodynamic changes, repeat arteriography is unlikely to be of benefit.  Signed,  Criselda Peaches, MD  [facility]  [facility]   Electronically Signed   By: Jacqulynn Cadet M.D.   On: 01/25/2014 18:08   Ir US Guide  Vasc Access Right  01/25/2014   CLINICAL DATA:  81 year old female with the lower GI bleed. CT of the abdomen and pelvis with contrast on 01/24/2014 demonstrated findings suggestive of extravasation of contrast material in the ascending colon. Patient was treated conservatively and did well until developing recurrent GI bleeding last night. A tagged nuclear medicine red blood cell study was positive for bleeding in the mid ascending colon. The patient now presents to Interventional Radiology for catheter angiography and possible embolization if the bleeding source is identified.  EXAM: SELECTIVE VISCERAL ARTERIOGRAPHY; ADDITIONAL ARTERIOGRAPHY; IR ULTRASOUND GUIDANCE VASC ACCESS RIGHT  Date: 01/25/2014  PROCEDURE: 1. Ultrasound-guided puncture of the right common femoral artery 2. Catheterization of the superior mesenteric artery with arteriogram 3. Catheterization of the ileal colloid artery with arteriogram 4. Catheterization of the inferior mesenteric artery with arteriogram 5. Limited right common femoral arteriogram 6. Arterial closure with Cordis ExoSeal Interventional Radiologist:  Criselda Peaches, MD  ANESTHESIA/SEDATION: Moderate (conscious) sedation was used. 4.5 mg Versed, 125 mcg Fentanyl were administered intravenously. The patient's vital signs were monitored continuously by radiology nursing throughout the procedure.  Sedation Time: 34 minutes  MEDICATIONS: 2 g Ancef administered intravenously  FLUOROSCOPY TIME:  4 min 18 seconds 391 mGy  CONTRAST:  161mL OMNIPAQUE IOHEXOL 300 MG/ML  SOLN  TECHNIQUE: Informed consent was obtained from the patient following explanation of the procedure, risks, benefits and alternatives. The patient understands, agrees and consents for the procedure. All questions were addressed. A time out was performed.  Maximal barrier sterile technique utilized including caps, mask, sterile gowns, sterile gloves, large sterile drape, hand hygiene, and Betadine skin prep.  The  right groin was interrogated with ultrasound. The patient has a high common femoral bifurcation. An image was obtained and stored for the medical record. Local anesthesia was attained by infiltration with 1% lidocaine. A small dermatotomy was made. Under real-time sonographic guidance, the common femoral artery was punctured just above the bifurcation. The 3 [place] inner portion of the a micropuncture sheath was advanced over the micro wire and a right common femoral arteriogram was performed confirming that the arterial puncture was below the inferior genu of the inferior epigastric artery and therefore below the inguinal ligament.  The micro sheath was then used to exchange the micro wire for a 0.035 Bentson wire. A C2 cobra catheter was advanced into the abdominal aorta an used to select the superior mesenteric artery. A superior mesenteric arteriogram was performed. There is a marked hypertrophy of the inferior pancreaticoduodenal arcade as well as a direct communication between the inferior pancreaticoduodenal arcade and the splenic artery consistent with an arc of Buhler. There is retrograde filling of the branches of the celiac artery. There is a high-grade stenosis versus occlusion at the origin of the celiac artery.  A repeat arteriography was performed to include the inferior branches. There is no definite evidence of active extravasation. There is some increased density in the lateral margin of the ascending colon in the region of the recent bleeding. Therefore, the C2 catheter was advanced over a glidewire and positioned in the common trunk of the ileocolic and right gastric arteries. Repeat arteriography was performed in multiple obliquities which demonstrated in the density to represent overlapping of vessels. Despite multiple angiographic runs, no evidence of bleeding or irregularity was identified.  The C2 catheter was removed over a wire and a RIM catheter was introduced. The rim was used to select  the inferior mesenteric artery and inferior mesenteric arteriogram was performed. No evidence of bleeding or vascular abnormality.  Hemostasis stress then the catheter was removed. Hemostasis was obtained with the assistance of a Cordis ExoSeal extra arterial collagen plug.  COMPLICATIONS: None  IMPRESSION: 1. Mesenteric arteriography is a negative for evidence of active bleeding or focal vascular abnormality that would suggest a source for bleeding. 2. 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 3. 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  PLAN: If the patient again developed significant acute lower GI bleeding resulting in hemodynamic changes (tachycardia or hypotension) repeat arteriography may be considered in an effort to identify and embolize the intermittent bleeding source.  In the setting of persistent intermittent low volume bleeding, the without hemodynamic changes, repeat arteriography is unlikely to be of benefit.  Signed,  Criselda Peaches, MD  [facility]  [facility]   Electronically Signed   By: Jacqulynn Cadet M.D.   On: 01/25/2014 18:08   Port Chest Xray - Line Placement  01/31/2014   CLINICAL DATA:  Central line placement.  EXAM: PORTABLE CHEST - 1 VIEW SAME DAY  COMPARISON:  05/26/2009  FINDINGS: The patient is mildly rotated to the right. There has been interval placement of right jugular central venous catheter tip projecting over the cavoatrial junction. Cardiac silhouette is within normal limits. The lungs are hypoinflated with mild elevation of the right hemidiaphragm. Mild bibasilar opacities are present. No definite pleural effusion or pneumothorax is identified. Right upper quadrant abdominal surgical clips are noted.  IMPRESSION: 1. Right jugular  catheter tip near the cavoatrial junction. 2. Hypoinflation with bibasilar subsegmental atelectasis.   Electronically Signed   By: Logan Bores   On: 01/31/2014 13:35    Microbiology: Recent Results (from the past 240 hour(s))  Clostridium Difficile by PCR     Status: None   Collection Time: 02/04/14  8:58 PM  Result Value Ref Range Status   C difficile by pcr NEGATIVE NEGATIVE Final    Comment: Performed at [place]: Basic Metabolic Panel:  Recent Labs Lab 02/03/14 0342 02/04/14 0932 02/07/14 0900 02/08/14 0515 02/09/14 0526  NA 141 138 136 140 137  K 3.8 3.5 3.3* 3.8 3.6  CL 107 102 105 105 105  CO2 28 29 25 26 26   GLUCOSE 133* 132* 104* 88 91  BUN <5* <5* 10 9 9   CREATININE 0.60 0.59 0.60 0.68 0.66  CALCIUM 8.0* 8.3* 8.1* 8.5 8.3*  MG  --   --  1.8 2.2  --    Liver Function Tests:  Recent Labs Lab 02/07/14 0900 02/08/14 0515  AST 12 14  ALT 13 12  ALKPHOS 53 56  BILITOT 1.2 0.8  PROT 5.8* 5.5*  ALBUMIN 3.2* 3.0*   No results for input(s): LIPASE, AMYLASE in the last 168 hours. No results for input(s): AMMONIA in the last 168 hours. CBC:  Recent Labs Lab 02/04/14 0932 02/05/14 0345 02/06/14 0515 02/07/14 0900 02/08/14 0515 02/09/14 0526  WBC 25.1* 22.7* 20.2* 23.3* 23.3* 23.4*  NEUTROABS 13.6*  --   --  12.8*  --   --   HGB 11.0* 10.5* 10.4* 10.8* 10.7* 10.7*  HCT 34.4* 33.1* 32.7* 34.7* 34.0* 33.7*  MCV 86.9 87.6 88.6 88.3 87.4 87.5  PLT 155 169 174 203 202 209   Cardiac Enzymes: No results for input(s): CKTOTAL, CKMB, CKMBINDEX, TROPONINI in the last 168 hours. BNP: BNP (last 3 results) No results for input(s): PROBNP in the last 8760 hours. CBG: No results for input(s): GLUCAP in the last 168 hours.     SignedIrine Seal  MD [facility] 02/09/2014, 1:55 PM

## 2014-02-09 NOTE — Progress Notes (Signed)
Pt sent to [facility]. Called report in to charge nurse. Instructions given to pt and family regarding discharge plan and medications. Education accepted.

## 2014-02-09 NOTE — Clinical Social Work Note (Signed)
Patient for d/c today to SNF bed at [facility]. Family and patient agreeable to this plan- plan transfer via EMS. Eduard Clos, MSW, [place]

## 2014-02-12 ENCOUNTER — Other Ambulatory Visit: Payer: Self-pay | Admitting: Oncology

## 2014-02-12 ENCOUNTER — Telehealth: Payer: Self-pay | Admitting: Nurse Practitioner

## 2014-02-12 NOTE — Telephone Encounter (Signed)
[place] from [place] cld to state pt just d/s and wanted an appt w/GM-adv GM out of town-could sch w/HF-gave [place] time & date-pt understood

## 2014-02-13 ENCOUNTER — Non-Acute Institutional Stay (SKILLED_NURSING_FACILITY): Payer: Medicare Other | Admitting: Internal Medicine

## 2014-02-13 DIAGNOSIS — R197 Diarrhea, unspecified: Secondary | ICD-10-CM

## 2014-02-13 DIAGNOSIS — M810 Age-related osteoporosis without current pathological fracture: Secondary | ICD-10-CM

## 2014-02-13 DIAGNOSIS — R938 Abnormal findings on diagnostic imaging of other specified body structures: Secondary | ICD-10-CM

## 2014-02-13 DIAGNOSIS — D62 Acute posthemorrhagic anemia: Secondary | ICD-10-CM

## 2014-02-13 DIAGNOSIS — D72829 Elevated white blood cell count, unspecified: Secondary | ICD-10-CM

## 2014-02-13 DIAGNOSIS — R9389 Abnormal findings on diagnostic imaging of other specified body structures: Secondary | ICD-10-CM

## 2014-02-13 DIAGNOSIS — K922 Gastrointestinal hemorrhage, unspecified: Secondary | ICD-10-CM

## 2014-02-13 DIAGNOSIS — C189 Malignant neoplasm of colon, unspecified: Secondary | ICD-10-CM

## 2014-02-14 ENCOUNTER — Other Ambulatory Visit: Payer: Self-pay | Admitting: *Deleted

## 2014-02-14 ENCOUNTER — Encounter: Payer: Self-pay | Admitting: Internal Medicine

## 2014-02-14 ENCOUNTER — Non-Acute Institutional Stay (SKILLED_NURSING_FACILITY): Payer: Medicare Other | Admitting: Internal Medicine

## 2014-02-14 DIAGNOSIS — C189 Malignant neoplasm of colon, unspecified: Secondary | ICD-10-CM

## 2014-02-14 DIAGNOSIS — R9389 Abnormal findings on diagnostic imaging of other specified body structures: Secondary | ICD-10-CM

## 2014-02-14 DIAGNOSIS — D72829 Elevated white blood cell count, unspecified: Secondary | ICD-10-CM

## 2014-02-14 DIAGNOSIS — K922 Gastrointestinal hemorrhage, unspecified: Secondary | ICD-10-CM

## 2014-02-14 DIAGNOSIS — R938 Abnormal findings on diagnostic imaging of other specified body structures: Secondary | ICD-10-CM

## 2014-02-14 DIAGNOSIS — D62 Acute posthemorrhagic anemia: Secondary | ICD-10-CM

## 2014-02-14 NOTE — Progress Notes (Signed)
Patient ID: April [name], female   DOB: 1925/07/30, 81 y.o.   MRN: [id number]  Provider:  Rexene Edison. Mariea Clonts, D.O., C.M.D.  Location:  [facility]  PCP: Tommy Medal, MD  Code Status: full code  Allergies  Allergen Reactions  . Ativan [Lorazepam] Other (See Comments)    confusion  . Codeine Nausea And Vomiting  . Sulfa Antibiotics     Chief Complaint  Patient presents with  . New Admit To SNF    hospitalized 01/24/14-02/09/14 for frequent bloody bowel movements    HPI: 81 y.o. female with h/o DCIS right breast s/p lumpectomy, senile osteoporosis, htn was admitted to rehab for short term rehab s/p prolonged hospitalization with rectal bleeding.  She had been having a few months of diarrhea since her cholecystectomy and had been started on colestipol for that.  She then started having blood bowel movements.  She has had leukocytosis with eosinophilia for at least a year, as well, and has been seen by an allergy specialist for this.  Her hospital admission wbc was 23.   Over the course of her stay, she required 1 unit platelets, 4 units FFP and 11 units PRBCs.  She had initial imaging suggesting a bleeding blood vessel, but EGD and cscope as well as scanning did not reveal this.  EGD and cscope also did not show any eosinophilic gastritis or duodenitis.  She was found to have an ascending colon mass and duodenal polyp on these procedures 01/28/14.  She also has endometrial thickening and fluid in the uterus for which she had an ultrasound done and this still needs outpatient GYN f/u.    She underwent laparoscopic assisted hemicolectomy by Dr. Johnathan Hausen on 01/30/14.  She had significant bleeding and hypotension overnight and required ex lap resection of prior small bowel anastomosis, upper EGD, and creation of a new ileo-colostomy.    Her pathology returned showing invasive colon adenocarcinoma with clear margins, but a single positive lymph node (out of 15).    She was treated for  an e coli UTI with keflex.  She developed loose stools again 02/05/14, but c diff was negative.  At the time of discharge here for short term rehab, her hgb was 10.7 and wbc remained in the 20s.    Since she's been here, she's done well with therapy and has required little help.  She had some constipation yesterday and then a loose stool this am after taking miralax once.  She will continue to use this if she does not have a bm in 3 days.  She will also use her colestipol as she has taken it.  She wants to cut down to bid iron due to her constipation.  She is very concerned about her diet and the dietitian here has been helping with that.  ROS: Review of Systems  Constitutional: Positive for weight loss and malaise/fatigue. Negative for fever and chills.  HENT: Negative for congestion.   Eyes: Negative for blurred vision.  Respiratory: Negative for shortness of breath.   Cardiovascular: Negative for chest pain and leg swelling.  Gastrointestinal: Positive for diarrhea and constipation. Negative for abdominal pain, blood in stool and melena.       Green loose stool this am   Genitourinary: Negative for dysuria, urgency and frequency.  Musculoskeletal: Negative for myalgias and falls.  Neurological: Positive for weakness. Negative for dizziness and loss of consciousness.  Endo/Heme/Allergies: Does not bruise/bleed easily.  Psychiatric/Behavioral: Negative for depression and memory loss.  Past Medical History  Diagnosis Date  . Hypertension   . History of breast cancer 2011    DCIS, Right breast. No radiation, took Aromasen 2012  . Senile osteoporosis     Took Fosamax x15years  . Cancer    Past Surgical History  Procedure Laterality Date  . Laparoscopic cholecystectomy  05/26/2009  . Breast lumpectomy  12/24/2009    Right, Dr Margot Chimes  . Cataract extraction w/ intraocular lens  implant, bilateral Bilateral [id number]  . Tonsillectomy and adenoidectomy    . Colonoscopy N/A 01/28/2014      Procedure: COLONOSCOPY;  Surgeon: Missy Sabins, MD;  Location: [facility];  Service: Endoscopy;  Laterality: N/A;  . Esophagogastroduodenoscopy N/A 01/28/2014    Procedure: ESOPHAGOGASTRODUODENOSCOPY (EGD);  Surgeon: Missy Sabins, MD;  Location: [facility];  Service: Endoscopy;  Laterality: N/A;  . Laparoscopic partial right colectomy Right 01/30/2014    Procedure: LAPAROSCOPIC PARTIAL RIGHT COLECTOMY;  Surgeon: Pedro Earls, MD;  Location: [facility];  Service: General;  Laterality: Right;  . Laparotomy N/A 01/31/2014    Procedure: EXPLORATORY LAPAROTOMY, RESECTION OF PRIOR SMALL BOWEL ANASTOMOSIS, UPPER ENDOSCOPY, CREATED A  NEW ILEO COLOSTOMY;  Surgeon: Pedro Earls, MD;  Location: [facility];  Service: General;  Laterality: N/A;   Social History:   reports that she has never smoked. She has never used smokeless tobacco. She reports that she drinks alcohol. She reports that she does not use illicit drugs.  Family History  Problem Relation Age of Onset  . Heart disease Mother   . Heart disease Father   . Diabetes Father   . Cancer Paternal Aunt     breast    Medications: Patient's Medications  New Prescriptions   No medications on file  Previous Medications   ACETAMINOPHEN (TYLENOL) 500 MG TABLET    Take 500 mg by mouth every 6 (six) hours as needed for pain (pain).   CALCIUM CARBONATE (OS-CAL) 600 MG TABS    Take 600 mg by mouth daily.    CETIRIZINE HCL (ZYRTEC PO)    Take 1 tablet by mouth at bedtime.    CHOLECALCIFEROL (VITAMIN D) 1000 UNITS TABLET    Take 4,000 Units by mouth at bedtime.    COLESTIPOL (MICRONIZED COLESTIPOL HCL) 1 G TABLET    Take 1 g by mouth 2 (two) times daily.   FERROUS SULFATE 325 (65 FE) MG TABLET    Take one after meals three times a day.  You can buy this over the counter.   LOPERAMIDE (IMODIUM) 2 MG CAPSULE    Take one tablet twice a day as needed to maintain a soft smooth bowel movement.  You can adjust this as needed.  You can but this over the  counter.   METRONIDAZOLE (METROGEL) 0.75 % GEL    Apply 1 application topically every morning.   MOMETASONE (NASONEX) 50 MCG/ACT NASAL SPRAY    Place 2 sprays into the nose daily.     MULTIPLE VITAMINS-MINERALS (MULTIVITAMIN WITH MINERALS) TABLET    Take 1 tablet by mouth daily.   ONDANSETRON (ZOFRAN-ODT) 4 MG DISINTEGRATING TABLET    Take 1 tablet (4 mg total) by mouth every 6 (six) hours as needed for nausea or vomiting.   SACCHAROMYCES BOULARDII (FLORASTOR) 250 MG CAPSULE    Take one twice a day till you see Dr. Hassell Done and ask about continuing them.  You can buy this over the counter.   TRAMADOL (ULTRAM) 50 MG TABLET    Take 1-2  tablets (50-100 mg total) by mouth every 6 (six) hours as needed for moderate pain or severe pain.  Modified Medications   No medications on file  Discontinued Medications   No medications on file     Physical Exam: Filed Vitals:   02/14/14 1108  BP: 106/58  Pulse: 90  Temp: 97.2 F (36.2 C)  Resp: 20  Height: 5\' 4"  (1.626 m)  Weight: 151 lb (68.493 kg)  SpO2: 96%  Physical Exam  Constitutional: She is oriented to person, place, and time. She appears well-developed and well-nourished. No distress.  Thin white female  HENT:  Head: Normocephalic and atraumatic.  Right Ear: External ear normal.  Left Ear: External ear normal.  Nose: Nose normal.  Mouth/Throat: Oropharynx is clear and moist.  Eyes: Conjunctivae and EOM are normal. Pupils are equal, round, and reactive to light.  Neck: Normal range of motion. Neck supple. No JVD present.  Cardiovascular: Regular rhythm, normal heart sounds and intact distal pulses.   Slightly tachycardic today at 100 during vs and exam  Pulmonary/Chest: Effort normal and breath sounds normal.  Abdominal:  Wearing abdominal binder  Musculoskeletal: Normal range of motion.  Neurological: She is alert and oriented to person, place, and time.  Skin: Skin is warm and dry.  Psychiatric: She has a normal mood and affect.  Her behavior is normal. Judgment and thought content normal.    Labs reviewed: Basic Metabolic Panel:  Recent Labs  01/25/14 0643  02/07/14 0900 02/08/14 0515 02/09/14 0526  NA 140  < > 136 140 137  K 4.1  < > 3.3* 3.8 3.6  CL 112  < > 105 105 105  CO2 23  < > 25 26 26   GLUCOSE 117*  < > 104* 88 91  BUN 13  < > 10 9 9   CREATININE 0.63  < > 0.60 0.68 0.66  CALCIUM 7.8*  < > 8.1* 8.5 8.3*  MG 1.8  --  1.8 2.2  --   PHOS 3.2  --   --   --   --   < > = values in this interval not displayed. Liver Function Tests:  Recent Labs  01/25/14 0643 02/07/14 0900 02/08/14 0515  AST 16 12 14   ALT 12 13 12   ALKPHOS 45 53 56  BILITOT 1.0 1.2 0.8  PROT 4.9* 5.8* 5.5*  ALBUMIN 3.2* 3.2* 3.0*   No results for input(s): LIPASE, AMYLASE in the last 8760 hours. No results for input(s): AMMONIA in the last 8760 hours. CBC:  Recent Labs  01/30/14 2331  02/04/14 0932  02/07/14 0900 02/08/14 0515 02/09/14 0526  WBC 30.0*  < > 25.1*  < > 23.3* 23.3* 23.4*  NEUTROABS 23.4*  --  13.6*  --  12.8*  --   --   HGB 10.5*  < > 11.0*  < > 10.8* 10.7* 10.7*  HCT 31.7*  < > 34.4*  < > 34.7* 34.0* 33.7*  MCV 87.6  < > 86.9  < > 88.3 87.4 87.5  PLT 83*  < > 155  < > 203 202 209  < > = values in this interval not displayed. Cardiac Enzymes: No results for input(s): CKTOTAL, CKMB, CKMBINDEX, TROPONINI in the last 8760 hours. BNP: Invalid input(s): POCBNP CBG:  Recent Labs  01/31/14 0919 01/31/14 1227 01/31/14 1255  GLUCAP 205* 164* 164*    Assessment/Plan 1. Colonic hemorrhage - was due to ascending colon mass  -required significant transfusion and right hemicolectomy and then  exlap resection of small bowel anastomosis, upper egd and new ileocolostomy -f/u cbc today -if this is stable, pt may return home with continued dietitian assistance and physical therapy there -keep f/u with Dr. Paulita Fujita, Dr. Hassell Done, and Dr. Jana Hakim  2. Colon cancer -had negative margins during resection but  one positive lymph node on pathology -future plans unclear at this time per patient, but she is very clear that she does not want chemotherapy or radiation treatments  3. Thickened endometrium -will require f/u with gynecology outpatient  4. Leukocytosis -has been longstanding for about 1 year--has seen allergy specialist -will see where wbcs are today also  5. Senile osteoporosis -was on fosamax for many years, cont ca with D  6. Diarrhea -had been ongoing post chole and improved with colestipol--continued -c diff negative at hospital  7. Acute blood loss anemia -will reduce iron to bid at her request due to some new constipation   Functional status: independent and ready to go back to apt with ongoing PT and dietitian assistance  Family/ staff Communication: discussed with pt, her husband and nursing staff  Labs/tests ordered:  Cbc, bmp stat

## 2014-02-16 ENCOUNTER — Encounter: Payer: Self-pay | Admitting: Internal Medicine

## 2014-02-16 MED ORDER — FERROUS SULFATE 325 (65 FE) MG PO TABS: 325.0000 mg | ORAL_TABLET | Freq: Two times a day (BID) | ORAL | Status: DC

## 2014-02-16 NOTE — Progress Notes (Signed)
Patient ID: April [name], female   DOB: [birth year], 81 y.o.   MRN: [id number]  Location:  [facility] Mariea Clonts, D.O., C.M.D.  PCP: Tommy Medal, MD  Code Status: full code; has a living will on [facility] records  Allergies  Allergen Reactions  . Ativan [Lorazepam] Other (See Comments)    confusion  . Codeine Nausea And Vomiting  . Sulfa Antibiotics     Chief Complaint  Patient presents with  . Discharge Note    discharge back to her home with home health therapy    HPI:  81 yo white female was here for short term rehab after partial bowel resection for severe rectal bleeding--found to have an ascending colon mass with negative margins and a single positive lymph home on pathology.  She required 11 units prbc transfusion.  She'd been having longstanding loose stools and leukocytosis with eosinophilia.  She has completed her rehab stay here--has been provided with handouts about dietary choices to make as her bowels adjust.  She's had a great improvement in her fecal urgency and stool consistency (this still varies considerably day to day).  WBC has increased to 29.5K and h/h stable at 11.5/35.  She is functioning independently, and feels safe to return to her apt with continued physical therapy there after doing an OT home safety assessment this am.  She is anxious about future plans for management of her colon cancer, and has said she does not want chemo and xrt that will affect her quality of life.  She has seen too many friends here at [facility] going through treatments with bad side effects.  Review of Systems:  Review of Systems  Constitutional: Positive for weight loss. Negative for fever, chills and malaise/fatigue.  HENT: Negative for congestion.   Eyes: Negative for blurred vision.  Respiratory: Negative for shortness of breath.   Cardiovascular: Negative for chest pain, palpitations and leg swelling.  Gastrointestinal: Negative for abdominal pain.    Genitourinary: Negative for dysuria, urgency and frequency.  Musculoskeletal: Negative for myalgias and falls.  Skin: Negative for rash.  Neurological: Negative for dizziness, loss of consciousness, weakness and headaches.  Psychiatric/Behavioral: Negative for depression and memory loss. The patient is nervous/anxious.      Past Medical History  Diagnosis Date  . Hypertension   . History of breast cancer 2011    DCIS, Right breast. No radiation, took Aromasen 2012  . Senile osteoporosis     Took Fosamax x15years  . Cancer     Past Surgical History  Procedure Laterality Date  . Laparoscopic cholecystectomy  05/26/2009  . Breast lumpectomy  12/24/2009    Right, Dr Margot Chimes  . Cataract extraction w/ intraocular lens  implant, bilateral Bilateral [id number]  . Tonsillectomy and adenoidectomy    . Colonoscopy N/A 01/28/2014    Procedure: COLONOSCOPY;  Surgeon: Missy Sabins, MD;  Location: [facility];  Service: Endoscopy;  Laterality: N/A;  . Esophagogastroduodenoscopy N/A 01/28/2014    Procedure: ESOPHAGOGASTRODUODENOSCOPY (EGD);  Surgeon: Missy Sabins, MD;  Location: [facility];  Service: Endoscopy;  Laterality: N/A;  . Laparoscopic partial right colectomy Right 01/30/2014    Procedure: LAPAROSCOPIC PARTIAL RIGHT COLECTOMY;  Surgeon: Pedro Earls, MD;  Location: [facility];  Service: General;  Laterality: Right;  . Laparotomy N/A 01/31/2014    Procedure: EXPLORATORY LAPAROTOMY, RESECTION OF PRIOR SMALL BOWEL ANASTOMOSIS, UPPER ENDOSCOPY, CREATED A  NEW ILEO COLOSTOMY;  Surgeon: Pedro Earls, MD;  Location: [facility];  Service:  General;  Laterality: N/A;    Social History:   reports that she has never smoked. She has never used smokeless tobacco. She reports that she drinks alcohol. She reports that she does not use illicit drugs.  Family History  Problem Relation Age of Onset  . Heart disease Mother   . Heart disease Father   . Diabetes Father   . Cancer Paternal Aunt     breast     Medications: Patient's Medications  New Prescriptions   No medications on file  Previous Medications   ACETAMINOPHEN (TYLENOL) 500 MG TABLET    Take 500 mg by mouth every 6 (six) hours as needed for pain (pain).   CALCIUM CARBONATE (OS-CAL) 600 MG TABS    Take 600 mg by mouth daily.    CETIRIZINE HCL (ZYRTEC PO)    Take 1 tablet by mouth at bedtime.    CHOLECALCIFEROL (VITAMIN D) 1000 UNITS TABLET    Take 4,000 Units by mouth at bedtime.    COLESTIPOL (MICRONIZED COLESTIPOL HCL) 1 G TABLET    Take 1 g by mouth 2 (two) times daily.   FERROUS SULFATE 325 (65 FE) MG TABLET    Take one after meals three times a day.  You can buy this over the counter.   LOPERAMIDE (IMODIUM) 2 MG CAPSULE    Take one tablet twice a day as needed to maintain a soft smooth bowel movement.  You can adjust this as needed.  You can but this over the counter.   METRONIDAZOLE (METROGEL) 0.75 % GEL    Apply 1 application topically every morning.   MOMETASONE (NASONEX) 50 MCG/ACT NASAL SPRAY    Place 2 sprays into the nose daily.     MULTIPLE VITAMINS-MINERALS (MULTIVITAMIN WITH MINERALS) TABLET    Take 1 tablet by mouth daily.   ONDANSETRON (ZOFRAN-ODT) 4 MG DISINTEGRATING TABLET    Take 1 tablet (4 mg total) by mouth every 6 (six) hours as needed for nausea or vomiting.   SACCHAROMYCES BOULARDII (FLORASTOR) 250 MG CAPSULE    Take one twice a day till you see Dr. Hassell Done and ask about continuing them.  You can buy this over the counter.   TRAMADOL (ULTRAM) 50 MG TABLET    Take 1-2 tablets (50-100 mg total) by mouth every 6 (six) hours as needed for moderate pain or severe pain.  Modified Medications   No medications on file  Discontinued Medications   No medications on file    Physical Exam: Filed Vitals:   02/14/14 1316  BP: 103/60  Pulse: 83  Temp: 97.1 F (36.2 C)  Resp: 18   Physical Exam  Constitutional: She is oriented to person, place, and time. She appears well-developed and well-nourished. No  distress.  Cardiovascular: Normal rate, regular rhythm, normal heart sounds and intact distal pulses.   Pulmonary/Chest: Effort normal and breath sounds normal. No respiratory distress. She has no rales.  Abdominal: Bowel sounds are normal. She exhibits no distension. There is no tenderness.  Wearing compression brace  Musculoskeletal: Normal range of motion.  Neurological: She is alert and oriented to person, place, and time.  Skin: Skin is warm and dry.  Psychiatric: She has a normal mood and affect.    Labs reviewed: Basic Metabolic Panel:  Recent Labs  01/25/14 0643  02/07/14 0900 02/08/14 0515 02/09/14 0526  NA 140  < > 136 140 137  K 4.1  < > 3.3* 3.8 3.6  CL 112  < > 105 105  105  CO2 23  < > 25 26 26   GLUCOSE 117*  < > 104* 88 91  BUN 13  < > 10 9 9   CREATININE 0.63  < > 0.60 0.68 0.66  CALCIUM 7.8*  < > 8.1* 8.5 8.3*  MG 1.8  --  1.8 2.2  --   PHOS 3.2  --   --   --   --   < > = values in this interval not displayed. Liver Function Tests:  Recent Labs  01/25/14 0643 02/07/14 0900 02/08/14 0515  AST 16 12 14   ALT 12 13 12   ALKPHOS 45 53 56  BILITOT 1.0 1.2 0.8  PROT 4.9* 5.8* 5.5*  ALBUMIN 3.2* 3.2* 3.0*   No results for input(s): LIPASE, AMYLASE in the last 8760 hours. No results for input(s): AMMONIA in the last 8760 hours. CBC:  Recent Labs  01/30/14 2331  02/04/14 0932  02/07/14 0900 02/08/14 0515 02/09/14 0526  WBC 30.0*  < > 25.1*  < > 23.3* 23.3* 23.4*  NEUTROABS 23.4*  --  13.6*  --  12.8*  --   --   HGB 10.5*  < > 11.0*  < > 10.8* 10.7* 10.7*  HCT 31.7*  < > 34.4*  < > 34.7* 34.0* 33.7*  MCV 87.6  < > 86.9  < > 88.3 87.4 87.5  PLT 83*  < > 155  < > 203 202 209  < > = values in this interval not displayed. Cardiac Enzymes: No results for input(s): CKTOTAL, CKMB, CKMBINDEX, TROPONINI in the last 8760 hours. BNP: Invalid input(s): POCBNP CBG:  Recent Labs  01/31/14 0919 01/31/14 1227 01/31/14 1255  GLUCAP 205* 164* 164*     Assessment/Plan:   1. Colonic hemorrhage -s/p 11 units prbcs total from bleeding and perioperatively -found to be due to ascending colon mass that was resected and margins clear, 1 positive lymph node  2. Colon cancer s/p partial right colectomy and second surgery with ileo-colostomy after bleeding continued -see #1, keep f/u with NP Frutoso Chase 02/22/14 with labs before as planned  -does not want aggressive xrt or chemo  3. Thickened endometrium -noted during hospitalizaton -suggested outpatient f/u with gynecology   4. Leukocytosis -primarily eosinophilia which was worked up outpatient previously by hematology and allergy physicians -no eosinophilic gastritis or duodenitis identified on egd/cscope biopsies  5. Acute blood loss anemia -hgb at dc 11.5, cont to monitor, but no rectal bleeding seen here in rehab and patient feels well   Patient is being discharged with home health services:  [facility]   More than 30 minutes spent coordinating care and meeting with Mr. And Mrs. Buenaventura this afternoon answering questions.  Labs/tests ordered:  Keep f/u appts with surgery, oncology and Dr. Minna Antis (PCP) as scheduled

## 2014-02-19 ENCOUNTER — Telehealth: Payer: Self-pay | Admitting: Nurse Practitioner

## 2014-02-19 NOTE — Telephone Encounter (Signed)
pt spouse cld to r/s pt appt-gave new time & date of r/s appt-

## 2014-02-22 ENCOUNTER — Other Ambulatory Visit: Payer: Medicare Other

## 2014-02-22 ENCOUNTER — Ambulatory Visit: Payer: Medicare Other | Admitting: Nurse Practitioner

## 2014-02-23 ENCOUNTER — Ambulatory Visit: Payer: Medicare Other | Admitting: Oncology

## 2014-02-23 ENCOUNTER — Other Ambulatory Visit: Payer: Medicare Other

## 2014-03-02 ENCOUNTER — Telehealth: Payer: Self-pay | Admitting: Hematology

## 2014-03-02 ENCOUNTER — Ambulatory Visit (HOSPITAL_BASED_OUTPATIENT_CLINIC_OR_DEPARTMENT_OTHER): Payer: Medicare Other

## 2014-03-02 ENCOUNTER — Other Ambulatory Visit (HOSPITAL_BASED_OUTPATIENT_CLINIC_OR_DEPARTMENT_OTHER): Payer: Medicare Other

## 2014-03-02 ENCOUNTER — Other Ambulatory Visit (HOSPITAL_COMMUNITY)
Admission: RE | Admit: 2014-03-02 | Discharge: 2014-03-02 | Disposition: A | Discharge status: Home / Self Care | Payer: Medicare Other | Source: Ambulatory Visit | Attending: Hematology | Admitting: Hematology

## 2014-03-02 ENCOUNTER — Encounter: Payer: Self-pay | Admitting: Hematology

## 2014-03-02 ENCOUNTER — Ambulatory Visit (HOSPITAL_BASED_OUTPATIENT_CLINIC_OR_DEPARTMENT_OTHER): Payer: Medicare Other | Admitting: Hematology

## 2014-03-02 DIAGNOSIS — C9110 Chronic lymphocytic leukemia of B-cell type not having achieved remission: Secondary | ICD-10-CM | POA: Diagnosis present

## 2014-03-02 DIAGNOSIS — Z853 Personal history of malignant neoplasm of breast: Secondary | ICD-10-CM

## 2014-03-02 DIAGNOSIS — C189 Malignant neoplasm of colon, unspecified: Secondary | ICD-10-CM

## 2014-03-02 DIAGNOSIS — IMO0001 Reserved for inherently not codable concepts without codable children: Secondary | ICD-10-CM

## 2014-03-02 DIAGNOSIS — M4850XS Collapsed vertebra, not elsewhere classified, site unspecified, sequela of fracture: Secondary | ICD-10-CM

## 2014-03-02 DIAGNOSIS — C182 Malignant neoplasm of ascending colon: Secondary | ICD-10-CM

## 2014-03-02 DIAGNOSIS — D72829 Elevated white blood cell count, unspecified: Secondary | ICD-10-CM

## 2014-03-02 DIAGNOSIS — M810 Age-related osteoporosis without current pathological fracture: Secondary | ICD-10-CM

## 2014-03-02 LAB — CBC WITH DIFFERENTIAL/PLATELET
BASO%: 1 % (ref 0.0–2.0)
Basophils Absolute: 0.2 10*3/uL — ABNORMAL HIGH (ref 0.0–0.1)
EOS ABS: 6.2 10*3/uL — AB (ref 0.0–0.5)
EOS%: 30.4 % — ABNORMAL HIGH (ref 0.0–7.0)
HCT: 38.9 % (ref 34.8–46.6)
HGB: 12.4 g/dL (ref 11.6–15.9)
LYMPH%: 14 % (ref 14.0–49.7)
MCH: 27.2 pg (ref 25.1–34.0)
MCHC: 31.9 g/dL (ref 31.5–36.0)
MCV: 85.3 fL (ref 79.5–101.0)
MONO#: 0.8 10*3/uL (ref 0.1–0.9)
MONO%: 3.7 % (ref 0.0–14.0)
NEUT#: 10.3 10*3/uL — ABNORMAL HIGH (ref 1.5–6.5)
NEUT%: 50.9 % (ref 38.4–76.8)
PLATELETS: 152 10*3/uL (ref 145–400)
RBC: 4.56 10*6/uL (ref 3.70–5.45)
RDW: 16.7 % — AB (ref 11.2–14.5)
WBC: 20.3 10*3/uL — AB (ref 3.9–10.3)
lymph#: 2.9 10*3/uL (ref 0.9–3.3)
nRBC: 0 % (ref 0–0)

## 2014-03-02 LAB — COMPREHENSIVE METABOLIC PANEL (CC13)
ALBUMIN: 3.6 g/dL (ref 3.5–5.0)
ALK PHOS: 79 U/L (ref 40–150)
ALT: 14 U/L (ref 0–55)
AST: 17 U/L (ref 5–34)
Anion Gap: 12 mEq/L — ABNORMAL HIGH (ref 3–11)
BUN: 9.5 mg/dL (ref 7.0–26.0)
CALCIUM: 9 mg/dL (ref 8.4–10.4)
CO2: 24 mEq/L (ref 22–29)
CREATININE: 0.8 mg/dL (ref 0.6–1.1)
Chloride: 108 mEq/L (ref 98–109)
EGFR: 72 mL/min/{1.73_m2} — ABNORMAL LOW (ref >90)
GLUCOSE: 101 mg/dL (ref 70–140)
Potassium: 3.9 mEq/L (ref 3.5–5.1)
Sodium: 143 mEq/L (ref 136–145)
TOTAL PROTEIN: 6.7 g/dL (ref 6.4–8.3)
Total Bilirubin: 0.56 mg/dL (ref 0.20–1.20)

## 2014-03-02 LAB — MORPHOLOGY: PLT EST: ADEQUATE

## 2014-03-02 LAB — FERRITIN CHCC: Ferritin: 80 ng/ml (ref 9–269)

## 2014-03-02 LAB — CHCC SMEAR

## 2014-03-02 LAB — TECHNOLOGIST REVIEW

## 2014-03-02 LAB — DRAW EXTRA CLOT TUBE

## 2014-03-02 NOTE — Telephone Encounter (Signed)
Pt confirmed labs/ov per 02/05 POF, gave pt AVS... KJ  °

## 2014-03-02 NOTE — Progress Notes (Addendum)
OFFICE PROGRESS NOTE  CC: Dr. Neldon Mc Dr. Cleone Slim, MD [address], [place] [id number] OTHER MDS: Clayton Lefort  DIAGNOSIS: stage III colon cancer  Colon cancer   Staging form: Colon and Rectum, AJCC 7th Edition     Clinical stage from 01/30/2014: Stage IIIB (T3, N1a, M0) - Unsigned     Colon cancer   01/28/2014 Tumor Marker CEA  14.6. Tumor MMR deficient, MSI high   01/30/2014 Initial Diagnosis Colon cancer   01/30/2014 Surgery right colon segmental resection, with clear margins.   01/30/2014 Pathologic Stage PT3N1aM0, stage IIIB, 1 out of 15 lymph nodes was positive.   OTHER ISSUES: 1. 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 patient was then begun on Aromasin 25 mg daily however she was discontinued 9 days later due to the fact that she developed  grade 3 arthralgias and myalgias. 2. Leukocytosis since July 2015, with significant neutrophilia and eosinophilia  3. History of thrombocytopenia when she was young  CURRENT THERAPY: Pending  INTERVAL HISTORY: Mrs. Dockter is a 81 year old Caucasian female, with past medical history of breast DCIS, previously under my colleague Dr. Virgie Dad care, and was transferred to me due to her recent diagnosed colon cancer.  She presented with diarrhea for 6 month and acute rectal bleeding and was admitted to hospital on 01/27/2014. She was transfused PRBCs. CT scan suggested bleeding from angiodysplasia. She was admitted for further management. She she underwent colonoscopy and EGD. Colonic mass was noted along with some abnormal duodenal findings. Biopsy of the colon mass showed adenocarcinoma. The patient underwent laproscopic R hemicolectomy on 1/5. A large bleed developed post-operatively and the patient was first transferred to the ICU and later underwent ex-lap with resection of small bowel anastamosis and new ileocolostomy on 1/6. She was  discharged to a rehab for a week and got home about one weeks ago.   She has home PT also now. She is able to walk one mile daily and all does all her ADLs. She has not gone outside yet.  She denies any pain, no nausea, her appetite has been low since surgery, she eats cheese, soup, and small regular meals, she lost 15lbs since the surgery. She has headaches for the past few month ago, and had brain MRI in the hospital which was negative.  She still has intermittent loose BM, 1-2 daily.   She was also noticed to have significant leukocytosis since July 2015, with dominantly elevated neutrophil and eosinophil. She had some cytopenia when she was young, and developed some cyst pia around her colon surgery, now resolved.  REVIEW OF SYSTEMS:  She complains of chronic stress urinary incontinence. Occasionally she has mild headaches. She feels fatigued, but this does not affect her daily activities. A detailed review of systems today was otherwise noncontributory  MEDICAL HISTORY: Past Medical History  Diagnosis Date  . Hypertension   . History of breast cancer 2011    DCIS, Right breast. No radiation, took Aromasen 2012  . Senile osteoporosis     Took Fosamax x15years  . Cancer     ALLERGIES:  is allergic to ativan; codeine; and sulfa antibiotics.  MEDICATIONS:  Current Outpatient Prescriptions  Medication Sig Dispense Refill  . acetaminophen (TYLENOL) 500 MG tablet Take 500 mg by mouth every 6 (six) hours as needed for pain (pain).    . Calcium Carbonate-Vitamin D 600-400 MG-UNIT per tablet Take 1 tablet by mouth  daily.    . cholecalciferol (VITAMIN D) 1000 UNITS tablet Take 4,000 Units by mouth at bedtime.     . colestipol (MICRONIZED COLESTIPOL HCL) 1 G tablet Take 1 g by mouth 2 (two) times daily.    . ferrous sulfate 325 (65 FE) MG tablet Take 1 tablet (325 mg total) by mouth 2 (two) times daily with a meal. (Patient taking differently: Take 325 mg by mouth daily with breakfast. ) 60  tablet 3  . Multiple Vitamins-Minerals (MULTIVITAMIN WITH MINERALS) tablet Take 1 tablet by mouth daily.    [name] saccharomyces boulardii (FLORASTOR) 250 MG capsule Take one twice a day till you see Dr. Hassell Done and ask about continuing them.  You can buy this over the counter.    . loperamide (IMODIUM) 2 MG capsule Take one tablet twice a day as needed to maintain a soft smooth bowel movement.  You can adjust this as needed.  You can but this over the counter. (Patient not taking: Reported on 03/02/2014) 60 capsule 0  . ondansetron (ZOFRAN-ODT) 4 MG disintegrating tablet Take 1 tablet (4 mg total) by mouth every 6 (six) hours as needed for nausea or vomiting. (Patient not taking: Reported on 03/02/2014) 20 tablet 0  . polyethylene glycol (MIRALAX / GLYCOLAX) packet Take 17 g by mouth as needed.     No current facility-administered medications for this visit.    SURGICAL HISTORY:  Past Surgical History  Procedure Laterality Date  . Laparoscopic cholecystectomy  05/26/2009  . Breast lumpectomy  12/24/2009    Right, Dr Margot Chimes  . Cataract extraction w/ intraocular lens  implant, bilateral Bilateral [id number]  . Tonsillectomy and adenoidectomy    . Colonoscopy N/A 01/28/2014    Procedure: COLONOSCOPY;  Surgeon: Missy Sabins, MD;  Location: [facility];  Service: Endoscopy;  Laterality: N/A;  . Esophagogastroduodenoscopy N/A 01/28/2014    Procedure: ESOPHAGOGASTRODUODENOSCOPY (EGD);  Surgeon: Missy Sabins, MD;  Location: [facility];  Service: Endoscopy;  Laterality: N/A;  . Laparoscopic partial right colectomy Right 01/30/2014    Procedure: LAPAROSCOPIC PARTIAL RIGHT COLECTOMY;  Surgeon: Pedro Earls, MD;  Location: [facility];  Service: General;  Laterality: Right;  . Laparotomy N/A 01/31/2014    Procedure: EXPLORATORY LAPAROTOMY, RESECTION OF PRIOR SMALL BOWEL ANASTOMOSIS, UPPER ENDOSCOPY, CREATED A  NEW ILEO COLOSTOMY;  Surgeon: Pedro Earls, MD;  Location: [facility];  Service: General;  Laterality: N/A;      PHYSICAL EXAMINATION: Filed Vitals:   03/02/14 0848  BP: 125/51  Pulse: 77  Temp: 98 F (36.7 C)  Resp: 18   Body mass index is 25.63 kg/(m^2).  ECOG PERFORMANCE STATUS: 2  Sclerae unicteric, pupils equal and reactive Oropharynx clear and moist-- no thrush or other lesions No cervical or supraclavicular adenopathy Lungs no rales or rhonchi Heart regular rate and rhythm Abd soft, surgical scars well healed, nontender, positive bowel sounds, no masses palpated MSK no focal spinal tenderness, no upper extremity lymphedema Neuro: nonfocal, well oriented, appropriate affect Breasts: The right breast is status post lumpectomy. There is no evidence of local recurrence. The right axilla is benign. The left breast is unremarkable.   LABORATORY DATA: Outside labs reviewed CBC Latest Ref Rng 03/02/2014 02/09/2014 02/08/2014  WBC 3.9 - 10.3 10e3/uL 20.3(H) 23.4(H) 23.3(H)  Hemoglobin 11.6 - 15.9 g/dL 12.4 10.7(L) 10.7(L)  Hematocrit 34.8 - 46.6 % 38.9 33.7(L) 34.0(L)  Platelets 145 - 400 10e3/uL 152 209 202    CMP Latest Ref Rng 02/09/2014 02/08/2014 02/07/2014  Glucose  70 - 99 mg/dL 91 88 104(H)  BUN 6 - 23 mg/dL 9 9 10   Creatinine 0.50 - 1.10 mg/dL 0.66 0.68 0.60  Sodium 135 - 145 mmol/L 137 140 136  Potassium 3.5 - 5.1 mmol/L 3.6 3.8 3.3(L)  Chloride 96 - 112 mEq/L 105 105 105  CO2 19 - 32 mmol/L 26 26 25   Calcium 8.4 - 10.5 mg/dL 8.3(L) 8.5 8.1(L)  Total Protein 6.0 - 8.3 g/dL - 5.5(L) 5.8(L)  Total Bilirubin 0.3 - 1.2 mg/dL - 0.8 1.2  Alkaline Phos 39 - 117 U/L - 56 53  AST 0 - 37 U/L - 14 12  ALT 0 - 35 U/L - 12 13   Results for CHINWE, LOPE (MRN [id number]) as of 03/02/2014 17:24  Ref. Range 01/28/2014 12:39  CEA Latest Range: 0.0-5.0 ng/mL 14.6 (H)    Pathology reports: Colon, segmental resection for tumor, partial right 01/30/2014  1 of 3 FINAL for Molock, Vihana A ([id number]) Diagnosis(continued) INVASIVE ADENOCARCINOMA, INVADING THROUGH THE MUSCULARIS PROPRIA  INTO PERICOLONIC FATTY TISSUE. ONE OF FIFTEEN LYMPH NODES, POSITIVE FOR METASTATIC ADENOCARCINOMA (1/15). RESECTION MARGINS, NEGATIVE FOR TUMOR. APPENDIX: BENIGN APPENDICEAL TISSUE, NO EVIDENCE OF ACTIVE INFLAMMATION OR NEOPLASM. Specimen: Right colon. Procedure: Segmental resection Tumor site: Ascending colon Specimen integrity: Intact Macroscopic intactness of mesorectum: Not applicable. Macroscopic tumor perforation: N/A Invasive tumor: Maximum size: 5.0 cm Histologic type(s): Invasive adenocarcinoma with extracellular mucin Histologic grade and differentiation: G2: moderately differentiated/low grade Type of polyp in which invasive carcinoma arose: N/A Microscopic extension of invasive tumor: invading through muscularis propria into pericolonic fatty tissue. Lymph-Vascular invasion: Not identified Peri-neural invasion: No Tumor deposit(s) (discontinuous extramural extension): No Resection margins: Negative Proximal margin: 10 cm Distal margin: 9 cm Circumferential (radial) (posterior ascending, posterior descending; lateral and posterior mid-rectum; and entire lower 1/3 rectum): 3 cm Treatment effect (neo-adjuvant therapy): No Additional polyp(s): N/A Non-neoplastic findings: N/A Lymph nodes: number examined 1; number positive: 15 Pathologic Staging: p T3, N1a, Mx Ancillary studies: MMR stains will be RADIOGRAPHIC STUDIES:  COMPARISON: Multiple priors  ACR Breast Density Category b: There are scattered areas of  fibroglandular density.  FINDINGS:  The patient has had a right lumpectomy. No new mass or suspicious  calcifications are seen in either breast. Compared to priors.  Mammographic images were processed with CAD.  IMPRESSION:  No mammographic evidence of local recurrence, right breast. No  mammographic evidence of malignancy, left breast.  RECOMMENDATION:  Diagnostic mammography in 1 year.  I have discussed the findings and recommendations with the patient.   Results were also provided in writing at the conclusion of the  visit. If applicable, a reminder letter will be sent to the patient  regarding the next appointment.  BI-RADS CATEGORY 1: Negative  Electronically Signed  By: Duke Salvia M.D.  On: 12/02/2012 11:47   ADDITIONAL INFORMATION: Mismatch Repair (MMR) Protein Immunohistochemistry (IHC) IHC Expression Result: MLH1: LOSS OF NUCLEAR EXPRESSION (LESS THAN 5% TUMOR EXPRESSION) MSH2: Preserved nuclear expression (greater 50% tumor expression) MSH6: Preserved nuclear expression (greater 50% tumor expression) PMS2: LOSS OF NUCLEAR EXPRESSION (LESS THAN 5% TUMOR EXPRESSION) * Internal control demonstrates intact nuclear expression Interpretation: ABNORMAL There is loss of the major and minor MMR proteins MLH1 and PMS2. The loss of expression may be secondary to promoter hyper-methylation, gene mutation or other genetic event. BRAF mutation testing and/or MLH1 methylation testing is indicated. The presence of a BRAF mutation and/or MLH1 hyper-methylation is indicative of a sporadic-type tumor. The absence of either BRAF mutation and/or  presence of normal-methylation indicate the possible presence of a hereditary germline mutation (e.g. Lynch syndrome) and referral to genetic counseling is warranted. It is recommended that the loss of protein expression be correlated with molecular based MSI testing.   will   ASSESSMENT: 81 y.o. [place] woman  1. Right colon cancer, pT3N1aM0, stage IIIB, G2, MSI high -I reviewed her surgical past findings with her and her husband, daughter in great details.  -I'll obtain a CT of chest without contrast to complete staging. -Given her locally advanced stage and elevated preoperative CEA, she has high risks of cancer recurrence, approximately 50%, especially in the first few years. -I discussed that the standard care is adjuvant chemotherapy with FOLFOX for stage III colon cancer. However given  her advanced age, limited performance status after surgery, I do not think she is a good candidate for adjuvant chemotherapy. Her tumor has high microsatellite instability, which would predict a poor response to single agent 5-FU.  -After lengthy discussion, we agree to observe her closely after surgery. I discussed the surveillance plan, with lab and physical exam every 3-4 months, repeat colonoscopy and CT scan in one year. -We also discussed the treatment option if she has cancer recurrence in the future, which includes surgery, systemic therapy especially immunotherapy for metastatic disease. -Given her tumor has high microsatellite instability, and her personal history of breast cancer, Lynch syndrome need to be ruled out. I'll refer her to see a Dietitian here.  2. Leukocytosis, rule out CML and hypereosinophilia syndrome. -Her WBC was normal until June 2015. Her total WBC has been in the range of 20-30 K, with predominant neutrophils and eosinophils. Her basophil has been slightly elevated also. This is concerning for myeloproliferative neoplasm, especially CML or hypereosinophilia syndrome -I will obtain BCR/ABL, JAK2 gene mutation, flow cytometry, and review her peripheral smears. -She may need a bone marrow biopsy if the above test result is not diagnostic.  3. History of right breast DCIS, ER/PR positive. -The patient previously couldn't tolerate aromatase inhibitor.  -No evidence of disease recurrence on exam. -Continue observation  -Continue yearly mammogram  Follow-up: Return in 3 weeks to discuss the above blood test results. We will decide if she needs a bone marrow biopsy at that time. Refer her to genetic counseling.  Truitt Merle, MD  03/02/2014, 9:36 AM

## 2014-03-03 LAB — CEA: CEA: 2.2 ng/mL (ref 0.0–5.0)

## 2014-03-08 ENCOUNTER — Telehealth: Payer: Self-pay | Admitting: Hematology

## 2014-03-08 LAB — FLOW CYTOMETRY

## 2014-03-08 NOTE — Addendum Note (Signed)
Addended by: Truitt Merle on: 03/08/2014 09:20 AM   Modules accepted: Orders

## 2014-03-08 NOTE — Telephone Encounter (Signed)
Confirmed lab appointment for 02/16.

## 2014-03-13 ENCOUNTER — Other Ambulatory Visit: Payer: Medicare Other

## 2014-03-14 ENCOUNTER — Ambulatory Visit: Payer: Medicare Other | Admitting: Nurse Practitioner

## 2014-03-14 ENCOUNTER — Other Ambulatory Visit: Payer: Medicare Other

## 2014-03-16 ENCOUNTER — Other Ambulatory Visit: Payer: Medicare Other

## 2014-03-16 ENCOUNTER — Ambulatory Visit (HOSPITAL_COMMUNITY)
Admission: RE | Admit: 2014-03-16 | Discharge: 2014-03-16 | Disposition: A | Discharge status: Home / Self Care | Payer: Medicare Other | Source: Ambulatory Visit | Attending: Hematology | Admitting: Hematology

## 2014-03-16 ENCOUNTER — Encounter (HOSPITAL_COMMUNITY): Payer: Self-pay

## 2014-03-16 ENCOUNTER — Other Ambulatory Visit (HOSPITAL_COMMUNITY)
Admission: RE | Admit: 2014-03-16 | Discharge: 2014-03-16 | Disposition: A | Discharge status: Home / Self Care | Payer: Medicare Other | Source: Ambulatory Visit | Attending: Hematology | Admitting: Hematology

## 2014-03-16 DIAGNOSIS — C189 Malignant neoplasm of colon, unspecified: Secondary | ICD-10-CM | POA: Insufficient documentation

## 2014-03-16 DIAGNOSIS — D72829 Elevated white blood cell count, unspecified: Secondary | ICD-10-CM | POA: Diagnosis present

## 2014-03-16 DIAGNOSIS — Z853 Personal history of malignant neoplasm of breast: Secondary | ICD-10-CM | POA: Diagnosis not present

## 2014-03-19 LAB — OVA AND PARASITE EXAMINATION

## 2014-03-22 ENCOUNTER — Ambulatory Visit (HOSPITAL_BASED_OUTPATIENT_CLINIC_OR_DEPARTMENT_OTHER): Payer: Medicare Other | Admitting: Hematology

## 2014-03-22 ENCOUNTER — Telehealth: Payer: Self-pay | Admitting: Hematology

## 2014-03-22 ENCOUNTER — Other Ambulatory Visit: Payer: Medicare Other

## 2014-03-22 VITALS — BP 123/46 | HR 81 | Temp 97.9°F | Resp 18 | Ht 61.0 in | Wt 137.1 lb

## 2014-03-22 DIAGNOSIS — R21 Rash and other nonspecific skin eruption: Secondary | ICD-10-CM

## 2014-03-22 DIAGNOSIS — C189 Malignant neoplasm of colon, unspecified: Secondary | ICD-10-CM

## 2014-03-22 DIAGNOSIS — Z853 Personal history of malignant neoplasm of breast: Secondary | ICD-10-CM

## 2014-03-22 DIAGNOSIS — D72829 Elevated white blood cell count, unspecified: Secondary | ICD-10-CM

## 2014-03-22 LAB — TISSUE HYBRIDIZATION TO NCBH

## 2014-03-22 NOTE — Progress Notes (Addendum)
OFFICE PROGRESS NOTE  CC: Dr. Neldon Mc Dr. Cleone Slim, MD [address] [place] [address] OTHER MDS: Clayton Lefort  DIAGNOSIS: stage III colon cancer  Colon cancer   Staging form: Colon and Rectum, AJCC 7th Edition     Clinical stage from 01/30/2014: Stage IIIB (T3, N1a, M0) - Unsigned     Colon cancer   01/28/2014 Tumor Marker CEA  14.6. Tumor MMR deficient, MSI high   01/30/2014 Initial Diagnosis Colon cancer   01/30/2014 Surgery right colon segmental resection, with clear margins.   01/30/2014 Pathologic Stage PT3N1aM0, stage IIIB, 1 out of 15 lymph nodes was positive.   OTHER ISSUES: 1. 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 patient was then begun on Aromasin 25 mg daily however she was discontinued 9 days later due to the fact that she developed  grade 3 arthralgias and myalgias. 2. Leukocytosis since July 2015, with significant neutrophilia and eosinophilia  3. History of thrombocytopenia when she was young  CURRENT THERAPY: observation  HISTORY OF INITIAL PRESENTATION: Mrs. Gahm is a 81 year old Caucasian female, with past medical history of breast DCIS, previously under my colleague Dr. Virgie Dad care, and was transferred to me due to her recent diagnosed colon cancer.  She presented with diarrhea for 6 month and acute rectal bleeding and was admitted to hospital on 01/27/2014. She was transfused PRBCs. CT scan suggested bleeding from angiodysplasia. She was admitted for further management. She she underwent colonoscopy and EGD. Colonic mass was noted along with some abnormal duodenal findings. Biopsy of the colon mass showed adenocarcinoma. The patient underwent laproscopic R hemicolectomy on 1/5. A large bleed developed post-operatively and the patient was first transferred to the ICU and later underwent ex-lap with resection of small bowel anastamosis and new ileocolostomy on  1/6. She was discharged to a rehab for a week and got home about one weeks ago.   She has home PT also now. She is able to walk one mile daily and all does all her ADLs. She has not gone outside yet.  She denies any pain, no nausea, her appetite has been low since surgery, she eats cheese, soup, and small regular meals, she lost 15lbs since the surgery. She has headaches for the past few month ago, and had brain MRI in the hospital which was negative.  She still has intermittent loose BM, 1-2 daily.   She was also noticed to have significant leukocytosis since July 2015, with dominantly elevated neutrophil and eosinophil. She had some cytopenia when she was young, and developed some cyst pia around her colon surgery, now resolved.  INTERIM HISTORY: She returns for follo wup. She developed skin rash on bilateral arms and trunk for the past a few weeks, is very itchy, and she was seen by drmatologist Dr. Ubaldo Glassing. She denies any recent new medication for supplements. She was given Benadryl and topical lotion.   She otherwise feels well overall. Her appetite is still low to moderate, she drinks Breezs, no recentl yweight loss. She denies any abdominal pain, nausea, bloating, or other symptoms. Her bowel movement is normal. She denied any bleeding episodes including hematochezia, melana, hemoptysis, hematuria or epitaxis. No fever or chills, no night sweats.   MEDICAL HISTORY: Past Medical History  Diagnosis Date  . Hypertension   . History of breast cancer 2011    DCIS, Right breast. No radiation, took Aromasen 2012  . Senile osteoporosis  Took Fosamax x15years  . Cancer     ALLERGIES:  is allergic to ativan; codeine; and sulfa antibiotics.  MEDICATIONS:  Current Outpatient Prescriptions  Medication Sig Dispense Refill  . acetaminophen (TYLENOL) 500 MG tablet Take 500 mg by mouth every 6 (six) hours as needed for pain (pain).    . Calcium Carbonate-Vitamin D 600-400 MG-UNIT per tablet Take  1 tablet by mouth daily.    . cholecalciferol (VITAMIN D) 1000 UNITS tablet Take 4,000 Units by mouth at bedtime.     . colestipol (MICRONIZED COLESTIPOL HCL) 1 G tablet Take 1 g by mouth 2 (two) times daily.    . ferrous sulfate 325 (65 FE) MG tablet Take 1 tablet (325 mg total) by mouth 2 (two) times daily with a meal. (Patient taking differently: Take 325 mg by mouth daily with breakfast. ) 60 tablet 3  . loperamide (IMODIUM) 2 MG capsule Take one tablet twice a day as needed to maintain a soft smooth bowel movement.  You can adjust this as needed.  You can but this over the counter. (Patient not taking: Reported on 03/02/2014) 60 capsule 0  . Multiple Vitamins-Minerals (MULTIVITAMIN WITH MINERALS) tablet Take 1 tablet by mouth daily.    . ondansetron (ZOFRAN-ODT) 4 MG disintegrating tablet Take 1 tablet (4 mg total) by mouth every 6 (six) hours as needed for nausea or vomiting. (Patient not taking: Reported on 03/02/2014) 20 tablet 0  . polyethylene glycol (MIRALAX / GLYCOLAX) packet Take 17 g by mouth as needed.    [name] saccharomyces boulardii (FLORASTOR) 250 MG capsule Take one twice a day till you see Dr. Hassell Done and ask about continuing them.  You can buy this over the counter.     No current facility-administered medications for this visit.    SURGICAL HISTORY:  Past Surgical History  Procedure Laterality Date  . Laparoscopic cholecystectomy  05/26/2009  . Breast lumpectomy  12/24/2009    Right, Dr Margot Chimes  . Cataract extraction w/ intraocular lens  implant, bilateral Bilateral [id number]  . Tonsillectomy and adenoidectomy    . Colonoscopy N/A 01/28/2014    Procedure: COLONOSCOPY;  Surgeon: Missy Sabins, MD;  Location: [facility];  Service: Endoscopy;  Laterality: N/A;  . Esophagogastroduodenoscopy N/A 01/28/2014    Procedure: ESOPHAGOGASTRODUODENOSCOPY (EGD);  Surgeon: Missy Sabins, MD;  Location: [facility];  Service: Endoscopy;  Laterality: N/A;  . Laparoscopic partial right colectomy Right  01/30/2014    Procedure: LAPAROSCOPIC PARTIAL RIGHT COLECTOMY;  Surgeon: Pedro Earls, MD;  Location: [facility];  Service: General;  Laterality: Right;  . Laparotomy N/A 01/31/2014    Procedure: EXPLORATORY LAPAROTOMY, RESECTION OF PRIOR SMALL BOWEL ANASTOMOSIS, UPPER ENDOSCOPY, CREATED A  NEW ILEO COLOSTOMY;  Surgeon: Pedro Earls, MD;  Location: [facility];  Service: General;  Laterality: N/A;   Review of Systems  Constitutional: Positive for malaise/fatigue. Negative for fever, chills and diaphoresis.  HENT: Negative for hearing loss.   Respiratory: Negative for cough, hemoptysis, sputum production and shortness of breath.   Cardiovascular: Negative for chest pain, palpitations and orthopnea.  Gastrointestinal: Negative for heartburn, nausea, vomiting, abdominal pain, diarrhea, constipation and blood in stool.  Genitourinary: Negative for dysuria, urgency and frequency.  Musculoskeletal: Negative for myalgias, back pain and neck pain.  Skin: Positive for itching and rash.  Neurological: Positive for dizziness. Negative for speech change, focal weakness, seizures, weakness and headaches.  Psychiatric/Behavioral: Negative for depression.    PHYSICAL EXAMINATION: Filed Vitals:   03/22/14 0939  BP:  123/46  Pulse: 81  Temp: 97.9 F (36.6 C)  Resp: 18   Body mass index is 25.92 kg/(m^2).  ECOG PERFORMANCE STATUS: 2  Sclerae unicteric, pupils equal and reactive Oropharynx clear and moist-- no thrush or other lesions No cervical or supraclavicular adenopathy Lungs no rales or rhonchi Heart regular rate and rhythm Abd soft, surgical scars well healed, nontender, positive bowel sounds, no masses palpated MSK no focal spinal tenderness, no upper extremity lymphedema Neuro: nonfocal, well oriented, appropriate affect Breasts: The right breast is status post lumpectomy. There is no evidence of local recurrence. The right axilla is benign. The left breast is unremarkable. Skin: Diffuse  papular skin rashes on bilateral arms, neck, chest and upper abdomen.   LABORATORY DATA: Outside labs reviewed CBC Latest Ref Rng 03/02/2014 02/09/2014 02/08/2014  WBC 3.9 - 10.3 10e3/uL 20.3(H) 23.4(H) 23.3(H)  Hemoglobin 11.6 - 15.9 g/dL 12.4 10.7(L) 10.7(L)  Hematocrit 34.8 - 46.6 % 38.9 33.7(L) 34.0(L)  Platelets 145 - 400 10e3/uL 152 209 202    CMP Latest Ref Rng 03/02/2014 02/09/2014 02/08/2014  Glucose 70 - 140 mg/dl 101 91 88  BUN 7.0 - 26.0 mg/dL 9.5 9 9   Creatinine 0.6 - 1.1 mg/dL 0.8 0.66 0.68  Sodium 136 - 145 mEq/L 143 137 140  Potassium 3.5 - 5.1 mEq/L 3.9 3.6 3.8  Chloride 96 - 112 mEq/L - 105 105  CO2 22 - 29 mEq/L 24 26 26   Calcium 8.4 - 10.4 mg/dL 9.0 8.3(L) 8.5  Total Protein 6.4 - 8.3 g/dL 6.7 - 5.5(L)  Total Bilirubin 0.20 - 1.20 mg/dL 0.56 - 0.8  Alkaline Phos 40 - 150 U/L 79 - 56  AST 5 - 34 U/L 17 - 14  ALT 0 - 55 U/L 14 - 12   CEA  Status: Finalresult Visible to patient:  Not Released Nextappt: 04/09/2014 at 02:00 PM in Oncology Fl[place]ne [place], Emi [place], [place]) Dx:  Colon cancer           Ref Range 3wk ago  29moago     CEA 0.0 - 5.0 ng/mL 2.2 14.6 (H)CM   Resulting Agency  [facility]          Pathology reports: Colon, segmental resection for tumor, partial right 01/30/2014  1 of 3 FINAL for Hey, Nikcole A (([id number] Diagnosis(continued) INVASIVE ADENOCARCINOMA, INVADING THROUGH THE MUSCULARIS PROPRIA INTO PERICOLONIC FATTY TISSUE. ONE OF FIFTEEN LYMPH NODES, POSITIVE FOR METASTATIC ADENOCARCINOMA (1/15). RESECTION MARGINS, NEGATIVE FOR TUMOR. APPENDIX: BENIGN APPENDICEAL TISSUE, NO EVIDENCE OF ACTIVE INFLAMMATION OR NEOPLASM. Specimen: Right colon. Procedure: Segmental resection Tumor site: Ascending colon Specimen integrity: Intact Macroscopic intactness of mesorectum: Not applicable. Macroscopic tumor perforation: N/A Invasive tumor: Maximum size: 5.0 cm Histologic type(s): Invasive adenocarcinoma with  extracellular mucin Histologic grade and differentiation: G2: moderately differentiated/low grade Type of polyp in which invasive carcinoma arose: N/A Microscopic extension of invasive tumor: invading through muscularis propria into pericolonic fatty tissue. Lymph-Vascular invasion: Not identified Peri-neural invasion: No Tumor deposit(s) (discontinuous extramural extension): No Resection margins: Negative Proximal margin: 10 cm Distal margin: 9 cm Circumferential (radial) (posterior ascending, posterior descending; lateral and posterior mid-rectum; and entire lower 1/3 rectum): 3 cm Treatment effect (neo-adjuvant therapy): No Additional polyp(s): N/A Non-neoplastic findings: N/A Lymph nodes: number examined 1; number positive: 15 Pathologic Staging: p T3, N1a, Mx Ancillary studies: MMR stains will be RADIOGRAPHIC STUDIES:  COMPARISON: Multiple priors  ACR Breast Density Category b: There are scattered areas of  fibroglandular density.  FINDINGS:  The patient has  had a right lumpectomy. No new mass or suspicious  calcifications are seen in either breast. Compared to priors.  Mammographic images were processed with CAD.  IMPRESSION:  No mammographic evidence of local recurrence, right breast. No  mammographic evidence of malignancy, left breast.  RECOMMENDATION:  Diagnostic mammography in 1 year.  I have discussed the findings and recommendations with the patient.  Results were also provided in writing at the conclusion of the  visit. If applicable, a reminder letter will be sent to the patient  regarding the next appointment.  BI-RADS CATEGORY 1: Negative  Electronically Signed  By: Duke Salvia M.D.  On: 12/02/2012 11:47   ADDITIONAL INFORMATION: Mismatch Repair (MMR) Protein Immunohistochemistry (IHC) IHC Expression Result: MLH1: LOSS OF NUCLEAR EXPRESSION (LESS THAN 5% TUMOR EXPRESSION) MSH2: Preserved nuclear expression (greater 50% tumor expression) MSH6:  Preserved nuclear expression (greater 50% tumor expression) PMS2: LOSS OF NUCLEAR EXPRESSION (LESS THAN 5% TUMOR EXPRESSION) * Internal control demonstrates intact nuclear expression Interpretation: ABNORMAL There is loss of the major and minor MMR proteins MLH1 and PMS2. The loss of expression may be secondary to promoter hyper-methylation, gene mutation or other genetic event. BRAF mutation testing and/or MLH1 methylation testing is indicated. The presence of a BRAF mutation and/or MLH1 hyper-methylation is indicative of a sporadic-type tumor. The absence of either BRAF mutation and/or presence of normal-methylation indicate the possible presence of a hereditary germline mutation (e.g. Lynch syndrome) and referral to genetic counseling is warranted. It is recommended that the loss of protein expression be correlated with molecular based MSI testing.   will   ASSESSMENT: 81 y.o. [place] woman  1. Right colon cancer, pT3N1aM0, stage IIIB, G2, MSI high -I reviewed her surgical past findings with her and her husband, daughter in great details.  -I reviewed her staging CT of chest, which is negative for metastasis. -Given her locally advanced stage and elevated preoperative CEA, she has high risks of cancer recurrence, approximately 50%, especially in the first few years. -I discussed that the standard care is adjuvant chemotherapy with FOLFOX for stage III colon cancer. However given her advanced age, limited performance status after surgery, I do not think she is a good candidate for adjuvant chemotherapy. Her tumor has high microsatellite instability, which is a good prognostic marker, also predicts a poor response to single agent 5-FU.  -After lengthy discussion, we agreed to observe her closely after surgery. I discussed the surveillance plan, with lab and physical exam every 3-4 months, repeat colonoscopy and CT scan in one year. -We also discussed the treatment option if she has cancer  recurrence in the future, which includes surgery, systemic therapy especially immunotherapy for metastatic disease. Clinical data has shown to accelerate response (70%) with PD1 antibody to colon cancer with MSI high.  -Given her tumor has high microsatellite instability, and her personal history of breast cancer, Lynch syndrome need to be ruled out. I'll refer her to see a genetic counselor here. Burnis Medin also check her tumor for P ref and MRI LH1 methylation, to see if her colon cancer is sporadic vs Lynch syndrome  2. Leukocytosis, myeloproliferative or neoplasm or hypereosinophilia syndrome (HES). -Her WBC was normal until June 2015. Her total WBC has been in the range of 20-30 K, with predominant neutrophils and eosinophils. Her basophil has been slightly elevated also. This is concerning for myeloproliferative neoplasm, especially CML or hypereosinophilia syndrome -BCR/ABL and JAK2 gene mutation( -), this is unlikely CML.  -Hypereosinophilia syndrome Fish panel is still pending -She may  need a bone marrow biopsy if the above test result is not diagnostic. -Her skin rash/urticaria is suspicious for HES related skin rash. I would suggest her dermatologist to get a punch skin biopsy. -HES patients with skin urticaria, has less cardiac in the neuro manifestations, and has a better prognosis.  3. Skin rash -This could be related to her HES -I recommend a skin punch biopsy.  3. History of right breast DCIS, ER/PR positive. -The patient previously couldn't tolerate aromatase inhibitor.  -No evidence of disease recurrence on exam. -Continue observation  -Continue yearly mammogram  Follow-up:  -Genetic referral -Return in 3 months for follow-up of her colon cancer.  -I'll call her when the hypereosinophilia syndrome Fish panel results his back.  -I recommend her dermatologist Dr. Claud Kelp to have a skin punch biopsy   Truitt Merle, MD  03/22/2014, 10:05 AM

## 2014-03-22 NOTE — Telephone Encounter (Signed)
Gave avs & calendar March.

## 2014-03-23 ENCOUNTER — Encounter: Payer: Self-pay | Admitting: Hematology

## 2014-03-23 ENCOUNTER — Telehealth: Payer: Self-pay | Admitting: *Deleted

## 2014-03-23 NOTE — Telephone Encounter (Signed)
Manuela Schwartz from [facility] called for patient's diagnosis code, which was required for billing.  She said that Medicare does not pay for the requested gene mutation order with a diagnosis of colon cancer.  Will inform Dr. Burr Medico.

## 2014-03-23 NOTE — Telephone Encounter (Signed)
Please let me know the name of the gene test, and I will let them know. Thanks

## 2014-03-27 ENCOUNTER — Telehealth: Payer: Self-pay | Admitting: *Deleted

## 2014-03-27 NOTE — Telephone Encounter (Signed)
Faxed Dr. Ernestina Penna office notes from 03/22/14 to [facility] @ Dr. Mickel Baas [place]'s office.  Informed Amber that Dr. Burr Medico would like to know if Dr. Ubaldo Glassing can do skin biopsy on pt. Amber's  Phone    [phone]   ;    Fax    [phone].

## 2014-03-28 ENCOUNTER — Other Ambulatory Visit: Payer: Self-pay | Admitting: Dermatology

## 2014-03-28 ENCOUNTER — Other Ambulatory Visit: Payer: Self-pay | Admitting: Hematology

## 2014-03-28 DIAGNOSIS — D471 Chronic myeloproliferative disease: Secondary | ICD-10-CM | POA: Insufficient documentation

## 2014-04-05 ENCOUNTER — Telehealth: Payer: Self-pay | Admitting: *Deleted

## 2014-04-05 NOTE — Telephone Encounter (Signed)
Daughter, [name] called to say the punch results are back from Dr Ubaldo Glassing. They have requested [facility] fax results to Dr Burr Medico ASAP. Pt has appt with Dr Ubaldo Glassing tomorrow at 2:00.  Daughter is wondering if Dr Burr Medico has any suggestions to help with the itching, is on prednisone until Monday or Tuesday. Has appt for genetic testing on Monday. Wants to know if Dr Burr Medico would like to see her sooner than 3/24. Daughter can be reached on her cell [phone]. (msg forwarded to Dr Burr Medico)

## 2014-04-09 ENCOUNTER — Encounter: Payer: Self-pay | Admitting: Genetic Counselor

## 2014-04-09 ENCOUNTER — Ambulatory Visit: Payer: Medicare Other | Admitting: Genetic Counselor

## 2014-04-09 ENCOUNTER — Other Ambulatory Visit: Payer: Self-pay | Admitting: Hematology

## 2014-04-09 ENCOUNTER — Telehealth: Payer: Self-pay | Admitting: Hematology

## 2014-04-09 ENCOUNTER — Other Ambulatory Visit (HOSPITAL_BASED_OUTPATIENT_CLINIC_OR_DEPARTMENT_OTHER): Payer: Medicare Other

## 2014-04-09 ENCOUNTER — Ambulatory Visit (HOSPITAL_BASED_OUTPATIENT_CLINIC_OR_DEPARTMENT_OTHER): Payer: Medicare Other | Admitting: Hematology

## 2014-04-09 ENCOUNTER — Encounter: Payer: Self-pay | Admitting: Hematology

## 2014-04-09 VITALS — BP 108/51 | HR 86 | Temp 98.2°F | Resp 18 | Ht 61.0 in | Wt 137.4 lb

## 2014-04-09 DIAGNOSIS — Z853 Personal history of malignant neoplasm of breast: Secondary | ICD-10-CM

## 2014-04-09 DIAGNOSIS — C182 Malignant neoplasm of ascending colon: Secondary | ICD-10-CM

## 2014-04-09 DIAGNOSIS — Z803 Family history of malignant neoplasm of breast: Secondary | ICD-10-CM

## 2014-04-09 DIAGNOSIS — D72119 Hypereosinophilic syndrome (hes), unspecified: Secondary | ICD-10-CM

## 2014-04-09 DIAGNOSIS — C189 Malignant neoplasm of colon, unspecified: Secondary | ICD-10-CM

## 2014-04-09 DIAGNOSIS — D72829 Elevated white blood cell count, unspecified: Secondary | ICD-10-CM | POA: Diagnosis not present

## 2014-04-09 DIAGNOSIS — D721 Eosinophilia: Principal | ICD-10-CM

## 2014-04-09 LAB — CBC WITH DIFFERENTIAL/PLATELET
BASO%: 0.8 % (ref 0.0–2.0)
Basophils Absolute: 0.3 10*3/uL — ABNORMAL HIGH (ref 0.0–0.1)
EOS%: 24.8 % — AB (ref 0.0–7.0)
Eosinophils Absolute: 8.3 10*3/uL — ABNORMAL HIGH (ref 0.0–0.5)
HCT: 39.2 % (ref 34.8–46.6)
HGB: 12.7 g/dL (ref 11.6–15.9)
LYMPH#: 4.8 10*3/uL — AB (ref 0.9–3.3)
LYMPH%: 14.5 % (ref 14.0–49.7)
MCH: 26.3 pg (ref 25.1–34.0)
MCHC: 32.4 g/dL (ref 31.5–36.0)
MCV: 81.3 fL (ref 79.5–101.0)
MONO#: 1.4 10*3/uL — ABNORMAL HIGH (ref 0.1–0.9)
MONO%: 4.1 % (ref 0.0–14.0)
NEUT#: 18.7 10*3/uL — ABNORMAL HIGH (ref 1.5–6.5)
NEUT%: 55.8 % (ref 38.4–76.8)
Platelets: 161 10*3/uL (ref 145–400)
RBC: 4.82 10*6/uL (ref 3.70–5.45)
RDW: 17.3 % — ABNORMAL HIGH (ref 11.2–14.5)
WBC: 33.5 10*3/uL — ABNORMAL HIGH (ref 3.9–10.3)

## 2014-04-09 NOTE — Telephone Encounter (Signed)
Gave avs & calendar for April. °

## 2014-04-09 NOTE — Progress Notes (Signed)
OFFICE PROGRESS NOTE  CC: Dr. Neldon [name] Dr. Cleone Slim, MD [address] [place] [address] OTHER MDS: April April [name]  DIAGNOSIS: stage III colon cancer  Colon cancer   Staging form: Colon and Rectum, AJCC 7th Edition     Clinical stage from 01/30/2014: Stage IIIB (T3, N1a, M0) - Unsigned     Colon cancer   01/28/2014 Tumor Marker CEA  14.6. Tumor MMR deficient, MSI high   01/30/2014 Initial Diagnosis Colon cancer   01/30/2014 Surgery right colon segmental resection, with clear margins.   01/30/2014 Pathologic Stage PT3N1aM0, stage IIIB, 1 out of 15 lymph nodes was positive.   OTHER ISSUES: 1. 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 patient was then begun on Aromasin 25 mg daily however she was discontinued 9 days later due to the fact that she developed  grade 3 arthralgias and myalgias. 2. Leukocytosis since July 2015, with significant neutrophilia and eosinophilia  3. History of thrombocytopenia when she was young  CURRENT THERAPY: observation  HISTORY OF INITIAL PRESENTATION: April April [name] is April [name] 81 year old Caucasian female, with past medical history of breast DCIS, previously under my colleague Dr. Virgie [name] care, and was transferred to me due to her recent diagnosed colon cancer.  She presented with diarrhea for 6 month and acute rectal bleeding and was admitted to hospital on 01/27/2014. She was transfused PRBCs. CT scan suggested bleeding from angiodysplasia. She was admitted for further management. She she underwent colonoscopy and EGD. Colonic mass was noted along with some abnormal duodenal findings. Biopsy of the colon mass showed adenocarcinoma. The patient underwent laproscopic R hemicolectomy on 1/5. April [name] large bleed developed post-operatively and the patient was first transferred to the ICU and later underwent ex-lap with resection of small bowel anastamosis and new ileocolostomy on  1/6. She was discharged to April [name] rehab for April [name] week and got home about one weeks ago.   She has home PT also now. She is able to walk one mile daily and all does all her ADLs. She has not gone outside yet.  She denies any pain, no nausea, her appetite has been low since surgery, she eats cheese, soup, and small regular meals, she lost 15lbs since the surgery. She has headaches for the past few month ago, and had brain MRI in the hospital which was negative.  She still has intermittent loose BM, 1-2 daily.   She was also noticed to have significant leukocytosis since July 2015, with dominantly elevated neutrophil and eosinophil. She had some cytopenia when she was young, and developed some cyst pia around her colon surgery, now resolved.  INTERIM HISTORY: She returns for follow up of her lab test for hypereosinophilia. Her skin rash got much worse about 2-3 weeks ago, was very itching, and Dr. Ubaldo April [name] did skin punch biopsy and started her on prednisone on 03/28/2014, and tapered off on 04/07/2014. Her skin rash and itchiness got significant improvement with prednisone, but she did not do well with prednisone with insomnia, mood swing (hyper) and irritability. She otherwise denies any fever, chest pain, dyspnea on exertion or any other new symptoms.  MEDICAL HISTORY: Past Medical History  Diagnosis Date  . Hypertension   . History of breast cancer 2011    DCIS, Right breast. No radiation, took Aromasen 2012  . Senile osteoporosis     Took Fosamax x15years  . Cancer     ALLERGIES:  is allergic to ativan; codeine;  and sulfa antibiotics.  MEDICATIONS:  Current Outpatient Prescriptions  Medication Sig Dispense Refill  . acetaminophen (TYLENOL) 500 MG tablet Take 500 mg by mouth every 6 (six) hours as needed for pain (pain).    . Calcium Carbonate-Vitamin D 600-400 MG-UNIT per tablet Take 1 tablet by mouth daily.    . cholecalciferol (VITAMIN D) 1000 UNITS tablet Take 4,000 Units by mouth at bedtime.      . colestipol (MICRONIZED COLESTIPOL HCL) 1 G tablet Take 1 g by mouth 2 (two) times daily.    . fexofenadine (ALLEGRA) 30 MG tablet Take 30 mg by mouth daily.    [name] loperamide (IMODIUM) 2 MG capsule Take one tablet twice April [name] day as needed to maintain April [name] soft smooth bowel movement.  You can adjust this as needed.  You can but this over the counter. 60 capsule 0  . Multiple Vitamins-Minerals (MULTIVITAMIN WITH MINERALS) tablet Take 1 tablet by mouth daily.    . ondansetron (ZOFRAN-ODT) 4 MG disintegrating tablet Take 1 tablet (4 mg total) by mouth every 6 (six) hours as needed for nausea or vomiting. 20 tablet 0  . Probiotic Product (ALIGN PO) Take 1 tablet by mouth daily.    . ranitidine (ZANTAC) 150 MG capsule Take 150 mg by mouth daily.    [name] triamcinolone cream (KENALOG) 0.1 %   0   No current facility-administered medications for this visit.    SURGICAL HISTORY:  Past Surgical History  Procedure Laterality Date  . Laparoscopic cholecystectomy  05/26/2009  . Breast lumpectomy  12/24/2009    Right, Dr April April [name]  . Cataract extraction w/ intraocular lens  implant, bilateral Bilateral 1017,5102  . Tonsillectomy and adenoidectomy    . Colonoscopy N/April [name] 01/28/2014    Procedure: COLONOSCOPY;  Surgeon: April Sabins, MD;  Location: [facility];  Service: Endoscopy;  Laterality: N/April [name];  . Esophagogastroduodenoscopy N/April [name] 01/28/2014    Procedure: ESOPHAGOGASTRODUODENOSCOPY (EGD);  Surgeon: April Sabins, MD;  Location: [facility];  Service: Endoscopy;  Laterality: N/April [name];  . Laparoscopic partial right colectomy Right 01/30/2014    Procedure: LAPAROSCOPIC PARTIAL RIGHT COLECTOMY;  Surgeon: April Earls, MD;  Location: [facility];  Service: General;  Laterality: Right;  . Laparotomy N/April [name] 01/31/2014    Procedure: EXPLORATORY LAPAROTOMY, RESECTION OF PRIOR SMALL BOWEL ANASTOMOSIS, UPPER ENDOSCOPY, CREATED April [name]  NEW ILEO COLOSTOMY;  Surgeon: April Earls, MD;  Location: [facility];  Service: General;  Laterality: N/April [name];   ROS    Constitutional: Denies fevers, chills or abnormal night sweats Eyes: Denies blurriness of vision, double vision or watery eyes Ears, nose, mouth, throat, and face: Denies mucositis or sore throat Respiratory: Denies cough, dyspnea or wheezes Cardiovascular: Denies palpitation, chest discomfort or lower extremity swelling Gastrointestinal: Denies nausea, heartburn or change in bowel habits Skin: see HPI  Lymphatics: Denies new lymphadenopathy or easy bruising Neurological:Denies numbness, tingling or new weaknesses Behavioral/Psych: Mood is stable, no new changes  All other systems were reviewed with the patient and are negative.  PHYSICAL EXAMINATION: Filed Vitals:   04/09/14 1551  BP: 108/51  Pulse: 86  Temp: 98.2 F (36.8 C)  Resp: 18   Body mass index is 25.97 kg/(m^2).  ECOG PERFORMANCE STATUS: 2  Sclerae unicteric, pupils equal and reactive Oropharynx clear and moist-- no thrush or other lesions No cervical or supraclavicular adenopathy Lungs no rales or rhonchi Heart regular rate and rhythm Abd soft, surgical scars well healed, nontender, positive bowel sounds, no masses palpated MSK no focal spinal tenderness, no upper  extremity lymphedema Neuro: nonfocal, well oriented, appropriate affect Breasts: The right breast is status post lumpectomy. There is no evidence of local recurrence. The right axilla is benign. The left breast is unremarkable. Skin: Diffuse papular skin rashes on bilateral arms, neck, chest and upper abdomen, which has significantly improved since last visit   LABORATORY DATA: Outside labs reviewed CBC Latest Ref Rng 04/09/2014 03/02/2014 02/09/2014  WBC 3.9 - 10.3 10e3/uL 33.5(H) 20.3(H) 23.4(H)  Hemoglobin 11.6 - 15.9 g/dL 12.7 12.4 10.7(L)  Hematocrit 34.8 - 46.6 % 39.2 38.9 33.7(L)  Platelets 145 - 400 10e3/uL 161 152 209    CMP Latest Ref Rng 03/02/2014 02/09/2014 02/08/2014  Glucose 70 - 140 mg/dl 101 91 88  BUN 7.0 - 26.0 mg/dL 9.5 9 9    Creatinine 0.6 - 1.1 mg/dL 0.8 0.66 0.68  Sodium 136 - 145 mEq/L 143 137 140  Potassium 3.5 - 5.1 mEq/L 3.9 3.6 3.8  Chloride 96 - 112 mEq/L - 105 105  CO2 22 - 29 mEq/L 24 26 26   Calcium 8.4 - 10.4 mg/dL 9.0 8.3(L) 8.5  Total Protein 6.4 - 8.3 g/dL 6.7 - 5.5(L)  Total Bilirubin 0.20 - 1.20 mg/dL 0.56 - 0.8  Alkaline Phos 40 - 150 U/L 79 - 56  AST 5 - 34 U/L 17 - 14  ALT 0 - 55 U/L 14 - 12   CEA  Status: Finalresult Visible to patient:  Not Released Nextappt: 04/09/2014 at 02:00 PM in Oncology April April [name], Emi [place], [place]) Dx:  Colon cancer           Ref Range 3wk ago  66moago     CEA 0.0 - 5.0 ng/mL 2.2 14.6 (H)CM   Resulting Agency  [facility]          Pathology reports: Colon, segmental resection for tumor, partial right 01/30/2014  1 of 3 FINAL for April April [name], April April [name] (([id number] Diagnosis(continued) INVASIVE ADENOCARCINOMA, INVADING THROUGH THE MUSCULARIS PROPRIA INTO PERICOLONIC FATTY TISSUE. ONE OF FIFTEEN LYMPH NODES, POSITIVE FOR METASTATIC ADENOCARCINOMA (1/15). RESECTION MARGINS, NEGATIVE FOR TUMOR. APPENDIX: BENIGN APPENDICEAL TISSUE, NO EVIDENCE OF ACTIVE INFLAMMATION OR NEOPLASM. Specimen: Right colon. Procedure: Segmental resection Tumor site: Ascending colon Specimen integrity: Intact Macroscopic intactness of mesorectum: Not applicable. Macroscopic tumor perforation: N/April [name] Invasive tumor: Maximum size: 5.0 cm Histologic type(s): Invasive adenocarcinoma with extracellular mucin Histologic grade and differentiation: G2: moderately differentiated/low grade Type of polyp in which invasive carcinoma arose: N/April [name] Microscopic extension of invasive tumor: invading through muscularis propria into pericolonic fatty tissue. Lymph-Vascular invasion: Not identified Peri-neural invasion: No Tumor deposit(s) (discontinuous extramural extension): No Resection margins: Negative Proximal margin: 10 cm Distal margin: 9 cm Circumferential  (radial) (posterior ascending, posterior descending; lateral and posterior mid-rectum; and entire lower 1/3 rectum): 3 cm Treatment effect (neo-adjuvant therapy): No Additional polyp(s): N/April [name] Non-neoplastic findings: N/April [name] Lymph nodes: number examined 1; number positive: 15 Pathologic Staging: p T3, N1a, Mx Ancillary studies: MMR stains will be RADIOGRAPHIC STUDIES:  COMPARISON: Multiple priors  ACR Breast Density Category b: There are scattered areas of  fibroglandular density.  FINDINGS:  The patient has had April [name] right lumpectomy. No new mass or suspicious  calcifications are seen in either breast. Compared to priors.  Mammographic images were processed with CAD.  IMPRESSION:  No mammographic evidence of local recurrence, right breast. No  mammographic evidence of malignancy, left breast.  RECOMMENDATION:  Diagnostic mammography in 1 year.  I have discussed the findings and recommendations with the patient.  Results were also provided in  writing at the conclusion of the  visit. If applicable, April [name] reminder letter will be sent to the patient  regarding the next appointment.  BI-RADS CATEGORY 1: Negative  Electronically Signed  By: Duke Salvia M.D.  On: 12/02/2012 11:47   ADDITIONAL INFORMATION: Mismatch Repair (MMR) Protein Immunohistochemistry (IHC) IHC Expression Result: MLH1: LOSS OF NUCLEAR EXPRESSION (LESS THAN 5% TUMOR EXPRESSION) MSH2: Preserved nuclear expression (greater 50% tumor expression) MSH6: Preserved nuclear expression (greater 50% tumor expression) PMS2: LOSS OF NUCLEAR EXPRESSION (LESS THAN 5% TUMOR EXPRESSION) * Internal control demonstrates intact nuclear expression Interpretation: ABNORMAL There is loss of the major and minor MMR proteins MLH1 and PMS2. The loss of expression may be secondary to promoter hyper-methylation, gene mutation or other genetic event. BRAF mutation testing and/or MLH1 methylation testing is indicated. The presence of April [name] BRAF  mutation and/or MLH1 hyper-methylation is indicative of April [name] sporadic-type tumor. The absence of either BRAF mutation and/or presence of normal-methylation indicate the possible presence of April [name] hereditary germline mutation (e.g. Lynch syndrome) and referral to genetic counseling is warranted. It is recommended that the loss of protein expression be correlated with molecular based MSI testing.   will   ASSESSMENT: 81 y.o. April April [name]  1. 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 Her total WBC has been in the range of 20-30 K, with predominant neutrophils and eosinophils. Her basophil has been slightly elevated also. This is concerning for myeloproliferative neoplasm, especially CML or hypereosinophilia syndrome -BCR/ABL and JAK2 gene mutation( -), this is unlikely CML.  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 -Her chest CT was negative. I'll also obtain April [name] cardiac echo -She responded to steroids very well, off now. May consider low-dose prednisone if she has recurrent skin rash.  2.  Right colon cancer, pT3N1aM0, stage IIIB, G2, MSI high -I reviewed her surgical past findings with her and her husband, daughter in great details.  -I reviewed her staging CT of chest, which is negative for metastasis. -Given her locally advanced stage and elevated preoperative CEA, she has high risks of cancer recurrence, approximately 50%, especially in the first few years. -I discussed that the standard care is adjuvant  chemotherapy with FOLFOX for stage III colon cancer. However given her advanced age, limited performance status after surgery, I do not think she is April [name] good candidate for adjuvant chemotherapy. Her tumor has high microsatellite instability, which is April [name] good prognostic marker, also predicts April [name] poor response to single agent 5-FU.  -After lengthy discussion, we agreed to observe her closely after surgery. I discussed the surveillance plan, with lab and physical exam every 3-4 months, repeat colonoscopy and CT scan in one year. -We also discussed the treatment option if she has cancer recurrence in the future, which includes surgery, systemic therapy especially immunotherapy for metastatic disease. Clinical data has shown to accelerate response (70%) with PD1 antibody to colon cancer with MSI high.  -Given her tumor has high microsatellite instability, and her personal history of breast cancer, Lynch syndrome need to be ruled out. I'll refer her to see April [name] genetic counselor here. -I 'll also check her tumor for BRAF  and MLH1 methylation, to see if her colon cancer is sporadic vs Lynch syndrome  3. History of right breast DCIS, ER/PR positive. -The patient previously couldn't tolerate aromatase inhibitor.  -No evidence of disease recurrence on exam. -Continue observation  -Continue yearly mammogram  Follow-up:  -Bone marrow biopsy next week -Echocardiogram -I'll see her back in 3 weeks. -If she has recurrent symptomatic skin rash, I would consider low-dose prednisone 10-20 mg daily.  Truitt Merle, MD  04/09/2014, 4:22 PM

## 2014-04-09 NOTE — Progress Notes (Signed)
REFERRING PROVIDER: Tommy Medal, MD [address] [place], [place] [address]   Truitt Merle, MD  PRIMARY PROVIDER:  Tommy Medal, MD  PRIMARY REASON FOR VISIT:  1. Colon cancer   2. hx: breast cancer, DCIS, right, receptor +   3. Family history of breast cancer      HISTORY OF PRESENT ILLNESS:   April [name], a 81 y.o. female, was seen for a [place] cancer genetics consultation at the request of Dr. Minna Antis due to a personal and family history of cancer.  April [name] presents to clinic today to discuss the possibility of a hereditary predisposition to cancer, genetic testing, and to further clarify her future cancer risks, as well as potential cancer risks for family members.   In 2009, at the age of 78, April [name] was diagnosed with DCIS of the breast. This was treated with lumpectomy, but no chemotherapy and radiation.  In 2016, at the age of 88, April [name], April [name] was diagnosed with right sided colon cancer.  MSI testing was high, and IHC found LOH of MLH1 and PMS2.       CANCER HISTORY:    Colon cancer   01/28/2014 Tumor Marker CEA  14.6. Tumor MMR deficient, MSI high   01/30/2014 Initial Diagnosis Colon cancer   01/30/2014 Surgery right colon segmental resection, with clear margins.   01/30/2014 Pathologic Stage PT3N1aM0, stage IIIB, 1 out of 15 lymph nodes was positive.     HORMONAL RISK FACTORS:  First live birth at age 15.  OCP use for approximately 0 years.  Ovaries intact: yes.  Hysterectomy: no.  Menopausal status: postmenopausal.  Colonoscopy: yes; 3 polyps lifetime, and diagnosis of colon cancer. Mammogram within the last year: yes. Number of breast biopsies: 1. Up to date with pelvic exams:  yes. Any excessive radiation exposure in the past:  no  Past Medical History  Diagnosis Date  . Hypertension   . History of breast cancer 2011    DCIS, Right breast. No radiation, took Aromasen 2012  . Senile osteoporosis     Took Fosamax x15years  . Cancer     Past Surgical  History  Procedure Laterality Date  . Laparoscopic cholecystectomy  05/26/2009  . Breast lumpectomy  12/24/2009    Right, Dr Margot Chimes  . Cataract extraction w/ intraocular lens  implant, bilateral Bilateral [id number]  . Tonsillectomy and adenoidectomy    . Colonoscopy N/A 01/28/2014    Procedure: COLONOSCOPY;  Surgeon: Missy Sabins, MD;  Location: [facility];  Service: Endoscopy;  Laterality: N/A;  . Esophagogastroduodenoscopy N/A 01/28/2014    Procedure: ESOPHAGOGASTRODUODENOSCOPY (EGD);  Surgeon: Missy Sabins, MD;  Location: [facility];  Service: Endoscopy;  Laterality: N/A;  . Laparoscopic partial right colectomy Right 01/30/2014    Procedure: LAPAROSCOPIC PARTIAL RIGHT COLECTOMY;  Surgeon: Pedro Earls, MD;  Location: [facility];  Service: General;  Laterality: Right;  . Laparotomy N/A 01/31/2014    Procedure: EXPLORATORY LAPAROTOMY, RESECTION OF PRIOR SMALL BOWEL ANASTOMOSIS, UPPER ENDOSCOPY, CREATED A  NEW ILEO COLOSTOMY;  Surgeon: Pedro Earls, MD;  Location: [facility];  Service: General;  Laterality: N/A;    History   Social History  . Marital Status: Married    Spouse Name: N/A  . Number of Children: 3  . Years of Education: N/A   Social History Main Topics  . Smoking status: Never Smoker   . Smokeless tobacco: Never Used  . Alcohol Use: Yes     Comment: 1 glass of wine a  night  . Drug Use: No  . Sexual Activity: Not Currently   Other Topics Concern  . Not on file   Social History Narrative    Married Jeneen Rinks), 3 children, retired English as a second language teacher. Moved from [place] 2005.   No smoking history.  2 alcoholic drinks daily   Living will, health care power of attorney documents have been completed     FAMILY HISTORY:  We obtained a detailed, 4-generation family history.  Significant diagnoses are listed below: Family History  Problem Relation Age of Onset  . Heart disease Mother   . Heart disease Father   . Diabetes Father   . Breast cancer Paternal Aunt      2 paternal aunts dx over 86s  . Throat cancer Maternal Grandmother   . Melanoma Daughter 46  . Breast cancer Other     niece, dx less than 56  . Breast cancer [name]     paternal [name] dx over 35   April [name] hd one brother and one sister who are deceased.  Her sister has a daughter who was diagnosed with breast cancer and died under the age of 38.  April [name] father died of diabetes comlications at 55. He had five sisters.  Two had breast cancer over the age of 101.  One sister had a daughter with breast cancer over the age of 22. Patient's maternal ancestors are of [place] descent, and paternal ancestors are of [place] descent. There is no reported Ashkenazi Jewish ancestry. There is no known consanguinity.  GENETIC COUNSELING ASSESSMENT: April [name] is a 81 y.o. female with a personal history of breast and colon cancer and MSI/IHC testing which somewhat suggestive of a Lynch syndrome and predisposition to cancer. We, therefore, discussed and recommended the following at today's visit.   DISCUSSION: We reviewed the characteristics, features and inheritance patterns of hereditary cancer syndromes. We discussed that her family history is not fully consistent with Lynch syndrome, despite her tumor testing.  According to the NCCN guidelines, the most likely situation is that this is a sporadic colon cancer.  BRAF and methylation studies will need to be performed to help determine this.   Based on April [name]'s personal and family history of breast cancer, she is also at risk for a hereditary breast cancer syndrome.  We reviewed BRCA mutations, as well as other hereditary breast cancer genes. We also discussed genetic testing, including the appropriate family members to test, the process of testing, insurance coverage and turn-around-time for results. We discussed the implications of a negative, positive and/or variant of uncertain significant result. With the concern for Lynch syndrome and the family  history of breast cancer, we recommended April [name] pursue genetic testing for the Breast/Ovarian cancer gene panel. The Breast/Ovarian gene panel offered by [facility] includes sequencing and rearrangement analysis for the following 21 genes:  ATM, BARD1, BRCA1, BRCA2, BRIP1, CDH1, CHEK2, EPCAM, FANCC, MLH1, MSH2, MSH6, NBN, PALB2, PMS2, PTEN, RAD51C, RAD51D, STK11, TP53, and XRCC2.     April [name] would like her daughter, April [name], to be called with her genetic testing results.  PLAN: After considering the risks, benefits, and limitations,April [name]  provided informed consent to pursue genetic testing and the blood sample was sent to [facility] for analysis of the Breast/Ovarian Cancer syndrome. Results should be available within approximately 3 weeks' time, at which point they will be disclosed by telephone to April [name], as will any additional recommendations warranted by these results. April [name] will receive  a summary of her genetic counseling visit and a copy of her results once available. This information will also be available in Epic. We encouraged April [name] to remain in contact with cancer genetics annually so that we can continuously update the family history and inform her of any changes in cancer genetics and testing that may be of benefit for her family. April [name] questions were answered to her satisfaction today. Our contact information was provided should additional questions or concerns arise.  Lastly, we encouraged April [name] to remain in contact with cancer genetics annually so that we can continuously update the family history and inform her of any changes in cancer genetics and testing that may be of benefit for this family.   April [name].  [place] questions were answered to her satisfaction today. Our contact information was provided should additional questions or concerns arise. Thank you for the referral and allowing [place] to share in the care of your patient.   Karen P. Florene Glen, [place], [facility] Certified Genetic  Counselor [name] phone: [phone]  The patient was seen for a total of 60 minutes in face-to-face genetic counseling.  This patient was discussed with Drs. Magrinat, Lindi Adie and/or Burr Medico who agrees with the above.    _______________________________________________________________________ For Office Staff:  Number of people involved in session: 2 Was an Intern/ student involved with case: no

## 2014-04-12 ENCOUNTER — Telehealth: Payer: Self-pay | Admitting: Hematology

## 2014-04-12 NOTE — Telephone Encounter (Signed)
Confirm appointment for March/April. Mailed April calendar.

## 2014-04-13 ENCOUNTER — Other Ambulatory Visit: Payer: Self-pay | Admitting: Radiology

## 2014-04-16 ENCOUNTER — Ambulatory Visit (HOSPITAL_COMMUNITY)
Admission: RE | Admit: 2014-04-16 | Discharge: 2014-04-16 | Disposition: A | Discharge status: Home / Self Care | Payer: Medicare Other | Source: Ambulatory Visit | Attending: Interventional Radiology | Admitting: Interventional Radiology

## 2014-04-16 ENCOUNTER — Encounter (HOSPITAL_COMMUNITY): Payer: Self-pay

## 2014-04-16 ENCOUNTER — Ambulatory Visit (HOSPITAL_COMMUNITY)
Admission: RE | Admit: 2014-04-16 | Discharge: 2014-04-16 | Disposition: A | Discharge status: Home / Self Care | Payer: Medicare Other | Source: Ambulatory Visit | Attending: Hematology | Admitting: Hematology

## 2014-04-16 DIAGNOSIS — M810 Age-related osteoporosis without current pathological fracture: Secondary | ICD-10-CM | POA: Diagnosis not present

## 2014-04-16 DIAGNOSIS — Z85038 Personal history of other malignant neoplasm of large intestine: Secondary | ICD-10-CM | POA: Diagnosis not present

## 2014-04-16 DIAGNOSIS — K219 Gastro-esophageal reflux disease without esophagitis: Secondary | ICD-10-CM | POA: Insufficient documentation

## 2014-04-16 DIAGNOSIS — Z79899 Other long term (current) drug therapy: Secondary | ICD-10-CM | POA: Diagnosis not present

## 2014-04-16 DIAGNOSIS — Z853 Personal history of malignant neoplasm of breast: Secondary | ICD-10-CM | POA: Insufficient documentation

## 2014-04-16 DIAGNOSIS — D721 Eosinophilia: Secondary | ICD-10-CM | POA: Diagnosis not present

## 2014-04-16 DIAGNOSIS — D72119 Hypereosinophilic syndrome (hes), unspecified: Secondary | ICD-10-CM | POA: Insufficient documentation

## 2014-04-16 LAB — CBC WITH DIFFERENTIAL/PLATELET
BASOS ABS: 0.2 10*3/uL — AB (ref 0.0–0.1)
Basophils Relative: 1 % (ref 0–1)
EOS PCT: 25 % — AB (ref 0–5)
Eosinophils Absolute: 6.2 10*3/uL — ABNORMAL HIGH (ref 0.0–0.7)
HCT: 40.7 % (ref 36.0–46.0)
Hemoglobin: 13 g/dL (ref 12.0–15.0)
LYMPHS ABS: 4.2 10*3/uL — AB (ref 0.7–4.0)
Lymphocytes Relative: 17 % (ref 12–46)
MCH: 25.8 pg — ABNORMAL LOW (ref 26.0–34.0)
MCHC: 31.9 g/dL (ref 30.0–36.0)
MCV: 80.8 fL (ref 78.0–100.0)
MONOS PCT: 4 % (ref 3–12)
Monocytes Absolute: 1 10*3/uL (ref 0.1–1.0)
NEUTROS PCT: 53 % (ref 43–77)
Neutro Abs: 13.1 10*3/uL — ABNORMAL HIGH (ref 1.7–7.7)
PLATELETS: 163 10*3/uL (ref 150–400)
RBC: 5.04 MIL/uL (ref 3.87–5.11)
RDW: 17.2 % — AB (ref 11.5–15.5)
WBC: 24.7 10*3/uL — AB (ref 4.0–10.5)

## 2014-04-16 LAB — BONE MARROW EXAM

## 2014-04-16 LAB — PROTIME-INR
INR: 1.09 (ref 0.00–1.49)
Prothrombin Time: 14.2 seconds (ref 11.6–15.2)

## 2014-04-16 LAB — APTT: aPTT: 31 seconds (ref 24–37)

## 2014-04-16 MED ORDER — SODIUM CHLORIDE 0.9 % IV SOLN
INTRAVENOUS | Status: DC
  Administered 2014-04-16: 07:00:00 via INTRAVENOUS

## 2014-04-16 MED ORDER — MIDAZOLAM HCL 2 MG/2ML IJ SOLN
INTRAMUSCULAR | Status: AC
  Filled 2014-04-16: qty 6

## 2014-04-16 MED ORDER — MIDAZOLAM HCL 2 MG/2ML IJ SOLN
INTRAMUSCULAR | Status: AC | PRN
  Administered 2014-04-16 (×2): 1 mg via INTRAVENOUS
  Administered 2014-04-16: 0.5 mg via INTRAVENOUS
  Administered 2014-04-16: 1 mg via INTRAVENOUS

## 2014-04-16 MED ORDER — FENTANYL CITRATE 0.05 MG/ML IJ SOLN
INTRAMUSCULAR | Status: AC | PRN
  Administered 2014-04-16 (×3): 25 ug via INTRAVENOUS

## 2014-04-16 MED ORDER — FENTANYL CITRATE 0.05 MG/ML IJ SOLN
INTRAMUSCULAR | Status: AC
  Filled 2014-04-16: qty 4

## 2014-04-16 NOTE — Consult Note (Signed)
Chief Complaint: Hx Breast Ca Hx Colon Ca New hypereosinophilia  Referring Physician(s): Feng,Yan  History of Present Illness: April [name] is a 81 y.o. female   Hx of breast and colon cancer Hypereosinophilia Possible myeloproliferative neoplasm Pt now scheduled for Bone marrow bx per Dr Burr Medico  Past Medical History  Diagnosis Date  . History of breast cancer 2011    DCIS, Right breast. No radiation, took Aromasen 2012  . Senile osteoporosis     Took Fosamax x15years  . Cancer   . Chronic diarrhea   . GERD (gastroesophageal reflux disease)     Past Surgical History  Procedure Laterality Date  . Laparoscopic cholecystectomy  05/26/2009  . Breast lumpectomy  12/24/2009    Right, Dr Margot Chimes  . Cataract extraction w/ intraocular lens  implant, bilateral Bilateral [id number]  . Tonsillectomy and adenoidectomy  1930  . Colonoscopy N/A 01/28/2014    Procedure: COLONOSCOPY;  Surgeon: Missy Sabins, MD;  Location: [facility];  Service: Endoscopy;  Laterality: N/A;  . Esophagogastroduodenoscopy N/A 01/28/2014    Procedure: ESOPHAGOGASTRODUODENOSCOPY (EGD);  Surgeon: Missy Sabins, MD;  Location: [facility];  Service: Endoscopy;  Laterality: N/A;  . Laparoscopic partial right colectomy Right 01/30/2014    Procedure: LAPAROSCOPIC PARTIAL RIGHT COLECTOMY;  Surgeon: Pedro Earls, MD;  Location: [facility];  Service: General;  Laterality: Right;  . Laparotomy N/A 01/31/2014    Procedure: EXPLORATORY LAPAROTOMY, RESECTION OF PRIOR SMALL BOWEL ANASTOMOSIS, UPPER ENDOSCOPY, CREATED A  NEW ILEO COLOSTOMY;  Surgeon: Pedro Earls, MD;  Location: [facility];  Service: General;  Laterality: N/A;    Allergies: Ativan; Codeine; and Sulfa antibiotics  Medications: Prior to Admission medications   Medication Sig Start Date End Date Taking? Authorizing Provider  acetaminophen (TYLENOL) 500 MG tablet Take 500 mg by mouth every 6 (six) hours as needed for pain (pain).   Yes Historical Provider, MD    Calcium Carbonate-Vitamin D 600-400 MG-UNIT per tablet Take 1 tablet by mouth daily.   Yes Historical Provider, MD  cholecalciferol (VITAMIN D) 1000 UNITS tablet Take 4,000 Units by mouth at bedtime.    Yes Historical Provider, MD  colestipol (MICRONIZED COLESTIPOL HCL) 1 G tablet Take 1 g by mouth daily.    Yes Historical Provider, MD  fexofenadine (ALLEGRA) 30 MG tablet Take 30 mg by mouth daily.   Yes Historical Provider, MD  loperamide (IMODIUM) 2 MG capsule Take one tablet twice a day as needed to maintain a soft smooth bowel movement.  You can adjust this as needed.  You can but this over the counter. Patient taking differently: Take 2 mg by mouth 2 (two) times daily as needed for diarrhea or loose stools. Take one tablet twice a day as needed to maintain a soft smooth bowel movement.  You can adjust this as needed.  You can but this over the counter. 02/09/14  Yes Eugenie Filler, MD  Multiple Vitamins-Minerals (MULTIVITAMIN WITH MINERALS) tablet Take 1 tablet by mouth daily.   Yes Historical Provider, MD  ondansetron (ZOFRAN-ODT) 4 MG disintegrating tablet Take 1 tablet (4 mg total) by mouth every 6 (six) hours as needed for nausea or vomiting. 02/08/14  Yes Earnstine Regal, PA-C  Probiotic Product (ALIGN PO) Take 1 tablet by mouth daily.   Yes Historical Provider, MD  ranitidine (ZANTAC) 150 MG capsule Take 150 mg by mouth daily.   Yes Historical Provider, MD  triamcinolone cream (KENALOG) 0.1 % Apply 1 application topically 2 (two) times  daily as needed (Itching).  03/09/14  Yes Historical Provider, MD     Family History  Problem Relation Age of Onset  . Heart disease Mother   . Heart disease Father   . Diabetes Father   . Breast cancer Paternal Aunt     2 paternal aunts dx over 26s  . Throat cancer Maternal Grandmother   . Melanoma Daughter 46  . Breast cancer Other     niece, dx less than 34  . Breast cancer Cousin     paternal cousin dx over 81  . Liver disease Sister   .  Diabetes Brother     History   Social History  . Marital Status: Married    Spouse Name: N/A  . Number of Children: 3  . Years of Education: N/A   Occupational History  . retired English as a second language teacher    Social History Main Topics  . Smoking status: Never Smoker   . Smokeless tobacco: Never Used  . Alcohol Use: Yes     Comment: 3 drinks a week   . Drug Use: No  . Sexual Activity: Not Currently   Other Topics Concern  . None   Social History Narrative    Married Jeneen Rinks), 3 children, retired English as a second language teacher. Moved from [place] 2005.   No smoking history.    3 alcoholic drinks per week    Living will, health care power of attorney documents have been completed   Exercise Tai chi, pool walk    Review of Systems: A 12 point ROS discussed and pertinent positives are indicated in the HPI above.  All other systems are negative.  Review of Systems  Constitutional: Negative for fever, activity change and unexpected weight change.  Respiratory: Negative for cough and shortness of breath.   Cardiovascular: Negative for chest pain.  Gastrointestinal: Positive for diarrhea.  Genitourinary: Negative for difficulty urinating.  Psychiatric/Behavioral: Negative for behavioral problems and confusion.     Vital Signs: BP 121/59 mmHg  Pulse 86  Temp(Src) 97.8 F (36.6 C) (Oral)  Resp 18  SpO2 100%  Physical Exam  Constitutional: She is oriented to person, place, and time. She appears well-developed.  Thin/ frail  Cardiovascular: Normal rate, regular rhythm and normal heart sounds.   No murmur heard. Pulmonary/Chest: Effort normal and breath sounds normal. She has no wheezes.  Abdominal: Soft. Bowel sounds are normal. There is no tenderness.  Musculoskeletal: Normal range of motion.  Neurological: She is alert and oriented to person, place, and time.  Skin: Skin is warm and dry.  Psychiatric: She has a normal mood and affect. Her behavior is normal. Judgment and thought  content normal.  Nursing note and vitals reviewed.   Mallampati Score:  MD Evaluation Airway: WNL Heart: WNL Abdomen: WNL Chest/ Lungs: WNL ASA  Classification: 3 Mallampati/Airway Score: One  Imaging: No results found.  Labs:  CBC:  Recent Labs  02/08/14 0515 02/09/14 0526 03/02/14 0822 04/09/14 1535  WBC 23.3* 23.4* 20.3* 33.5*  HGB 10.7* 10.7* 12.4 12.7  HCT 34.0* 33.7* 38.9 39.2  PLT 202 209 152 161    COAGS:  Recent Labs  01/25/14 0643 01/30/14 0455 01/31/14 1432  INR 1.29 1.18 1.21  APTT  --  36 24    BMP:  Recent Labs  02/04/14 0932 02/07/14 0900 02/08/14 0515 02/09/14 0526 03/02/14 1036  NA 138 136 140 137 143  K 3.5 3.3* 3.8 3.6 3.9  CL 102 105 105 105  --  CO2 29 25 26 26 24   GLUCOSE 132* 104* 88 91 101  BUN <5* 10 9 9  9.5  CALCIUM 8.3* 8.1* 8.5 8.3* 9.0  CREATININE 0.59 0.60 0.68 0.66 0.8  GFRNONAA 81* 80* 77* 78*  --   GFRAA >90 >90 89* >90  --     LIVER FUNCTION TESTS:  Recent Labs  01/25/14 0643 02/07/14 0900 02/08/14 0515 03/02/14 1036  BILITOT 1.0 1.2 0.8 0.56  AST 16 12 14 17   ALT 12 13 12 14   ALKPHOS 45 53 56 79  PROT 4.9* 5.8* 5.5* 6.7  ALBUMIN 3.2* 3.2* 3.0* 3.6    TUMOR MARKERS:  Recent Labs  01/28/14 1239 03/02/14 1036  CEA 14.6* 2.2    Assessment and Plan:  Hypereosinophilia Concerning for myeloproliferative neoplasm Scheduled for BM bx Risks and Benefits discussed with the patient including, but not limited to bleeding, infection, damage to adjacent structures or low yield requiring additional tests. All of the patient's questions were answered, patient is agreeable to proceed. Consent signed and in chart.   Thank you for this interesting consult.  I greatly enjoyed meeting April [name] and look forward to participating in their care.  Signed: Aqil Goetting A 04/16/2014, 8:04 AM   I spent a total of  20 Minutes   in face to face in clinical consultation, greater than 50% of which was  counseling/coordinating care for BM bx

## 2014-04-16 NOTE — Discharge Instructions (Signed)
Bone Marrow Aspiration and Bone Biopsy Examination of the bone marrow is a valuable test to diagnose blood disorders. A bone marrow biopsy takes a sample of bone and a small amount of fluid and cells from inside the bone. A bone marrow aspiration removes only the marrow. Bone marrow aspiration and bone biopsies are used to stage different disorders of the blood, such as leukemia. Staging will help your caregiver understand how far the disease has progressed.  The tests are also useful in diagnosing:  Fever of unknown origin (FUO).  Bacterial infections and other widespread fungal infections.  Cancers that have spread (metastasized) to the bone marrow.  Diseases that are characterized by a deficiency of an enzyme (storage diseases). This includes:  Niemann-Pick disease.  Gaucher disease. PROCEDURE  Sites used to get samples include:   Back of your hip bone (posterior iliac crest).  Both aspiration and biopsy.  Front of your hip bone (anterior iliac crest).  Both aspiration and biopsy.  Breastbone (sternum).  Aspiration from your breastbone (done only in adults). This method is rarely used. When you get a hip bone aspiration:  You are placed lying on your side with the upper knee brought up and flexed with the lower leg straight.  The site is prepared, cleaned with an antiseptic scrub, and draped. This keeps the biopsy area clean.  The skin and the area down to the lining of the bone (periosteum) are made numb with a local anesthetic.  The bone marrow aspiration needle is inserted. You will feel pressure on your bone.  Once inside the marrow cavity, a sample of bone marrow is sucked out (aspirated) for pathology slides.  The material collected for bone marrow slides is processed immediately by a technologist.  The technician selects the marrow particles to make the slides for pathology.  The marrow aspiration needle is removed. Then pressure is applied to the site with  gauze until bleeding has stopped. Following an aspiration, a bone marrow biopsy may be performed as well. The technique for this is very similar. A dressing is then applied.  RISKS AND COMPLICATIONS  The main complications of a bone marrow aspiration and biopsy include infection and bleeding.  Complications are uncommon. The procedure may not be performed in patients with bleeding tendencies.  A very rare complication from the procedure is injury to the heart during a breastbone (sternal) marrow aspiration. Only bone marrow aspirations are performed in this area.  Long-lasting pain at the site of the bone marrow aspiration and biopsy is uncommon. Your caregiver will let you know when you are to get your results and will discuss them with you. You may make an appointment with your caregiver to find out the results. Do not assume everything is normal if you have not heard from your caregiver or the medical facility. It is important for you to follow up on all of your test results. Document Released: 01/16/2004 Document Revised: 04/06/2011 Document Reviewed: 01/10/2008 [facility] Patient Information 2015 [place], [place]. This information is not intended to replace advice given to you by your health care provider. Make sure you discuss any questions you have with your health care provider. Conscious Sedation Sedation is the use of medicines to promote relaxation and relieve discomfort and anxiety. Conscious sedation is a type of sedation. Under conscious sedation you are less alert than normal but are still able to respond to instructions or stimulation. Conscious sedation is used during short medical and dental procedures. It is milder than deep sedation  or general anesthesia and allows you to return to your regular activities sooner.  LET [facility] CARE PROVIDER KNOW ABOUT:   Any allergies you have.  All medicines you are taking, including vitamins, herbs, eye drops, creams, and over-the-counter  medicines.  Use of steroids (by mouth or creams).  Previous problems you or members of your family have had with the use of anesthetics.  Any blood disorders you have.  Previous surgeries you have had.  Medical conditions you have.  Possibility of pregnancy, if this applies.  Use of cigarettes, alcohol, or illegal drugs. RISKS AND COMPLICATIONS Generally, this is a safe procedure. However, as with any procedure, problems can occur. Possible problems include:  Oversedation.  Trouble breathing on your own. You may need to have a breathing tube until you are awake and breathing on your own.  Allergic reaction to any of the medicines used for the procedure. BEFORE THE PROCEDURE  You may have blood tests done. These tests can help show how well your kidneys and liver are working. They can also show how well your blood clots.  A physical exam will be done.  Only take medicines as directed by your health care provider. You may need to stop taking medicines (such as blood thinners, aspirin, or nonsteroidal anti-inflammatory drugs) before the procedure.   Do not eat or drink at least 6 hours before the procedure or as directed by your health care provider.  Arrange for a responsible adult, family member, or friend to take you home after the procedure. He or she should stay with you for at least 24 hours after the procedure, until the medicine has worn off. PROCEDURE   An intravenous (IV) catheter will be inserted into one of your veins. Medicine will be able to flow directly into your body through this catheter. You may be given medicine through this tube to help prevent pain and help you relax.  The medical or dental procedure will be done. AFTER THE PROCEDURE  You will stay in a recovery area until the medicine has worn off. Your blood pressure and pulse will be checked.   Depending on the procedure you had, you may be allowed to go home when you can tolerate liquids and your  pain is under control. Document Released: 10/07/2000 Document Revised: 01/17/2013 Document Reviewed: 09/19/2012 [facility] Patient Information 2015 [place], [place]. This information is not intended to replace advice given to you by your health care provider. Make sure you discuss any questions you have with your health care provider. Bone Biopsy, Needle, Care After Read the instructions outlined below and refer to this sheet in the next few weeks. These discharge instructions provide you with general information on caring for yourself after you leave the hospital. Your caregiver may also give you specific instructions. While your treatment has been planned according to the most current medical practices available, unavoidable complications sometimes occur. If you have any problems or questions after discharge, call your caregiver. Finding out the results of your test Not all test results are available during your visit. If your test results are not back during the visit, make an appointment with your caregiver to find out the results. Do not assume everything is normal if you have not heard from your caregiver or the medical facility. It is important for you to follow up on all of your test results.  SEEK MEDICAL CARE IF:   You have redness, swelling, or increasing pain at the site of the biopsy.  You  have pus coming from the biopsy site.  You have drainage from the biopsy site lasting longer than 1 day.  You notice a bad smell coming from the biopsy site or dressing.  You develop persistent nausea or vomiting. SEEK IMMEDIATE MEDICAL CARE IF:  You have a fever.  You develop a rash.  You have difficulty breathing.  You develop any reaction or side effects to medicines given. Document Released: 08/01/2004 Document Revised: 11/02/2012 Document Reviewed: 06/19/2008 [facility] Patient Information 2015 [place], [place]. This information is not intended to replace advice given to you by your health  care provider. Make sure you discuss any questions you have with your health care provider.  Conscious Sedation, Adult, Care After Refer to this sheet in the next few weeks. These instructions provide you with information on caring for yourself after your procedure. Your health care provider may also give you more specific instructions. Your treatment has been planned according to current medical practices, but problems sometimes occur. Call your health care provider if you have any problems or questions after your procedure. WHAT TO EXPECT AFTER THE PROCEDURE  After your procedure:  You may feel sleepy, clumsy, and have poor balance for several hours.  Vomiting may occur if you eat too soon after the procedure. HOME CARE INSTRUCTIONS  Do not participate in any activities where you could become injured for at least 24 hours. Do not:  Drive.  Swim.  Ride a bicycle.  Operate heavy machinery.  Cook.  Use power tools.  Climb ladders.  Work from a high place.  Do not make important decisions or sign legal documents until you are improved.  If you vomit, drink water, juice, or soup when you can drink without vomiting. Make sure you have little or no nausea before eating solid foods.  Only take over-the-counter or prescription medicines for pain, discomfort, or fever as directed by your health care provider.  Make sure you and your family fully understand everything about the medicines given to you, including what side effects may occur.  You should not drink alcohol, take sleeping pills, or take medicines that cause drowsiness for at least 24 hours.  If you smoke, do not smoke without supervision.  If you are feeling better, you may resume normal activities 24 hours after you were sedated.  Keep all appointments with your health care provider. SEEK MEDICAL CARE IF:  Your skin is pale or bluish in color.  You continue to feel nauseous or vomit.  Your pain is getting worse  and is not helped by medicine.  You have bleeding or swelling.  You are still sleepy or feeling clumsy after 24 hours. SEEK IMMEDIATE MEDICAL CARE IF:  You develop a rash.  You have difficulty breathing.  You develop any type of allergic problem.  You have a fever. MAKE SURE YOU:  Understand these instructions.  Will watch your condition.  Will get help right away if you are not doing well or get worse. Document Released: 11/02/2012 Document Reviewed: 11/02/2012 [facility] Patient Information 2015 [place], [place]. This information is not intended to replace advice given to you by your health care provider. Make sure you discuss any questions you have with your health care provider.

## 2014-04-16 NOTE — Procedures (Signed)
Interventional Radiology Procedure Note  Procedure: CT guided aspirate and core biopsy of right iliac bone Complications: None EBL: None Recommendations: - Bedrest supine x 2 hrs - Hydrocodone PRN  Pain - Follow biopsy results  Signed,  Criselda Peaches, MD

## 2014-04-17 LAB — FISH, PERIPHERAL BLOOD

## 2014-04-18 ENCOUNTER — Ambulatory Visit (HOSPITAL_COMMUNITY)
Admission: RE | Admit: 2014-04-18 | Discharge: 2014-04-18 | Disposition: A | Discharge status: Home / Self Care | Payer: Medicare Other | Source: Ambulatory Visit | Attending: Hematology | Admitting: Hematology

## 2014-04-18 DIAGNOSIS — D721 Eosinophilia: Secondary | ICD-10-CM | POA: Diagnosis not present

## 2014-04-18 DIAGNOSIS — D72119 Hypereosinophilic syndrome (hes), unspecified: Secondary | ICD-10-CM

## 2014-04-18 DIAGNOSIS — I517 Cardiomegaly: Secondary | ICD-10-CM | POA: Diagnosis not present

## 2014-04-18 HISTORY — PX: TRANSTHORACIC ECHOCARDIOGRAM: SHX275

## 2014-04-18 NOTE — Progress Notes (Signed)
  Echocardiogram 2D Echocardiogram has been performed.  April [name] 04/18/2014, 9:53 AM

## 2014-04-19 ENCOUNTER — Ambulatory Visit: Payer: BLUE CROSS/BLUE SHIELD | Admitting: Hematology

## 2014-04-19 ENCOUNTER — Other Ambulatory Visit: Payer: BLUE CROSS/BLUE SHIELD

## 2014-04-24 LAB — CHROMOSOME ANALYSIS, BONE MARROW

## 2014-04-24 LAB — TISSUE HYBRIDIZATION (BONE MARROW)-NCBH

## 2014-04-25 ENCOUNTER — Encounter: Payer: Self-pay | Admitting: Nurse Practitioner

## 2014-04-25 ENCOUNTER — Non-Acute Institutional Stay: Payer: Medicare Other | Admitting: Nurse Practitioner

## 2014-04-25 VITALS — BP 106/58 | HR 72 | Temp 97.8°F | Ht 64.5 in | Wt 139.0 lb

## 2014-04-25 DIAGNOSIS — D62 Acute posthemorrhagic anemia: Secondary | ICD-10-CM

## 2014-04-25 DIAGNOSIS — R938 Abnormal findings on diagnostic imaging of other specified body structures: Secondary | ICD-10-CM | POA: Diagnosis not present

## 2014-04-25 DIAGNOSIS — D72119 Hypereosinophilic syndrome (hes), unspecified: Secondary | ICD-10-CM

## 2014-04-25 DIAGNOSIS — R9389 Abnormal findings on diagnostic imaging of other specified body structures: Secondary | ICD-10-CM

## 2014-04-25 DIAGNOSIS — F411 Generalized anxiety disorder: Secondary | ICD-10-CM

## 2014-04-25 DIAGNOSIS — R197 Diarrhea, unspecified: Secondary | ICD-10-CM

## 2014-04-25 DIAGNOSIS — D721 Eosinophilia: Secondary | ICD-10-CM | POA: Diagnosis not present

## 2014-04-25 DIAGNOSIS — C189 Malignant neoplasm of colon, unspecified: Secondary | ICD-10-CM | POA: Diagnosis not present

## 2014-04-25 MED ORDER — SERTRALINE HCL 25 MG PO TABS: 25.0000 mg | ORAL_TABLET | Freq: Every day | ORAL | Status: DC

## 2014-04-25 NOTE — Progress Notes (Signed)
Patient ID: April [name], female   DOB: 24-Sep-1925, 81 y.o.   MRN: [id number]    Nursing Home Location:  [facility] (12)  PCP: REED, TIFFANY, DO  Allergies  Allergen Reactions  . Ativan [Lorazepam] Other (See Comments)    confusion  . Codeine Nausea And Vomiting  . Sulfa Antibiotics     Pt doesn't remember     Chief Complaint  Patient presents with  . Medical Management of Chronic Issues    New Patient -switching doctors Dr. Minna Antis leaving. Here with daughter Williemae Area  . Anxiety    just lately    HPI:  Patient is a 81 y.o. female seen today at [facility] to establish care Pt with hx of diarrhea last year that lasted approximately 6 months, then she had acute rectal bleeding and was hospitalized in January 2016. Colonoscopy was done and a tumor was found in her colon.  She underwent colon resection and then had an acute bleed requiring multiple units of PRBC's (according to daughter 11 units).  She had an additional surgery the next day to fix the bleed. Went to rehab at [facility] after hospitalization, now back at home and doing very well considering the condition she was in.   Patient has also had chronically high eosinophils which oncology is following in addition to her colon cancer.  She follows up with oncology in one week. She had a bone marrow biopsy one week ago.   Hx of breast cancer as well, genetic testing being done.   Thicken endometrium found during hospitalization-- following with Dr Radene Knee regarding this, awaiting results from oncologist before moving forward with Dr. Radene Knee  Today the daughter reports pt with increased anxiety due to all the testing, workups and new diagnoses.  Patient reports that she feels irritable and that is not like her.    Review of Systems:  Review of Systems  Constitutional: Negative for activity change, appetite change, fatigue and unexpected weight change.    HENT: Negative for congestion, hearing loss and sore throat.   Eyes: Negative.        Eye exam next week- macular degeneration   Respiratory: Negative for cough and shortness of breath.   Cardiovascular: Negative for chest pain, palpitations and leg swelling.  Gastrointestinal: Negative for abdominal pain, diarrhea and constipation.  Genitourinary: Negative for dysuria, urgency, frequency and difficulty urinating.       Gets up twice at night to urinate.    Musculoskeletal: Negative for myalgias and arthralgias.  Skin: Negative for color change.       Itchy skin-- following with Dr Ubaldo Glassing  Neurological: Negative for dizziness and weakness.  Psychiatric/Behavioral: Negative for behavioral problems, confusion and agitation. The patient is nervous/anxious.     Past Medical History  Diagnosis Date  . History of breast cancer 2011    DCIS, Right breast. No radiation, took Aromasen 2012  . Senile osteoporosis     Took Fosamax x15years  . Cancer   . Chronic diarrhea   . GERD (gastroesophageal reflux disease)    Past Surgical History  Procedure Laterality Date  . Laparoscopic cholecystectomy  05/26/2009  . Breast lumpectomy  12/24/2009    Right, Dr Margot Chimes  . Cataract extraction w/ intraocular lens  implant, bilateral Bilateral [id number]  . Tonsillectomy and adenoidectomy  1930  . Colonoscopy N/A 01/28/2014    Procedure: COLONOSCOPY;  Surgeon: Missy Sabins, MD;  Location: [facility];  Service: Endoscopy;  Laterality: N/A;  . Esophagogastroduodenoscopy N/A 01/28/2014    Procedure: ESOPHAGOGASTRODUODENOSCOPY (EGD);  Surgeon: Missy Sabins, MD;  Location: [facility];  Service: Endoscopy;  Laterality: N/A;  . Laparoscopic partial right colectomy Right 01/30/2014    Procedure: LAPAROSCOPIC PARTIAL RIGHT COLECTOMY;  Surgeon: Pedro Earls, MD;  Location: [facility];  Service: General;  Laterality: Right;  . Laparotomy N/A 01/31/2014    Procedure: EXPLORATORY LAPAROTOMY, RESECTION OF PRIOR SMALL  BOWEL ANASTOMOSIS, UPPER ENDOSCOPY, CREATED A  NEW ILEO COLOSTOMY;  Surgeon: Pedro Earls, MD;  Location: [facility];  Service: General;  Laterality: N/A;   Social History:   reports that she has never smoked. She has never used smokeless tobacco. She reports that she drinks alcohol. She reports that she does not use illicit drugs.  Family History  Problem Relation Age of Onset  . Heart disease Mother   . Heart disease Father   . Diabetes Father   . Breast cancer Paternal Aunt     2 paternal aunts dx over 72s  . Throat cancer Maternal Grandmother   . Melanoma Daughter 65  . Breast cancer Other     niece, dx less than 67  . Breast cancer Cousin     paternal cousin dx over 25  . Liver disease Sister   . Diabetes Brother     Medications: Patient's Medications  New Prescriptions   No medications on file  Previous Medications   ACETAMINOPHEN (TYLENOL) 500 MG TABLET    Take 500 mg by mouth every 6 (six) hours as needed for pain (pain).   CALCIUM CARBONATE-VITAMIN D 600-400 MG-UNIT PER TABLET    Take 1 tablet by mouth daily.   CHOLECALCIFEROL (VITAMIN D) 1000 UNITS TABLET    Take 4,000 Units by mouth at bedtime.    COLESTIPOL (MICRONIZED COLESTIPOL HCL) 1 G TABLET    Take 1 g by mouth daily.    FEXOFENADINE (ALLEGRA) 30 MG TABLET    Take 30 mg by mouth daily.   LOPERAMIDE (IMODIUM) 2 MG CAPSULE    Take one tablet twice a day as needed to maintain a soft smooth bowel movement.  You can adjust this as needed.  You can but this over the counter.   MULTIPLE VITAMINS-MINERALS (MULTIVITAMIN WITH MINERALS) TABLET    Take 1 tablet by mouth daily.   ONDANSETRON (ZOFRAN-ODT) 4 MG DISINTEGRATING TABLET    Take 1 tablet (4 mg total) by mouth every 6 (six) hours as needed for nausea or vomiting.   PROBIOTIC PRODUCT (ALIGN PO)    Take 1 tablet by mouth daily.   RANITIDINE (ZANTAC) 150 MG CAPSULE    Take 150 mg by mouth daily.   TRIAMCINOLONE CREAM (KENALOG) 0.1 %    Apply 1 application topically 2  (two) times daily as needed (Itching).   Modified Medications   No medications on file  Discontinued Medications   No medications on file     Physical Exam: Filed Vitals:   04/25/14 1437  BP: 106/58  Pulse: 72  Temp: 97.8 F (36.6 C)  TempSrc: Oral  Height: 5' 4.5" (1.638 m)  Weight: 139 lb (63.05 kg)  SpO2: 99%    Physical Exam  Constitutional: She is oriented to person, place, and time. She appears well-developed and well-nourished.  HENT:  Head: Normocephalic and atraumatic.  Neck: Normal range of motion. Neck supple. No JVD present.  Cardiovascular: Normal rate, regular rhythm and intact distal pulses.   No murmur heard. Pulmonary/Chest: Effort normal and  breath sounds normal. She has no wheezes.  Abdominal: Soft. There is no tenderness.  Has 4 cm vertical scar at [employer] from surgery in January.   Musculoskeletal: Normal range of motion.  Neurological: She is alert and oriented to person, place, and time.  Skin: Skin is warm and dry.  Psychiatric: She has a normal mood and affect.    Labs reviewed: Basic Metabolic Panel:  Recent Labs  01/25/14 0643  02/07/14 0900 02/08/14 0515 02/09/14 0526 03/02/14 1036  NA 140  < > 136 140 137 143  K 4.1  < > 3.3* 3.8 3.6 3.9  CL 112  < > 105 105 105  --   CO2 23  < > 25 26 26 24   GLUCOSE 117*  < > 104* 88 91 101  BUN 13  < > 10 9 9  9.5  CREATININE 0.63  < > 0.60 0.68 0.66 0.8  CALCIUM 7.8*  < > 8.1* 8.5 8.3* 9.0  MG 1.8  --  1.8 2.2  --   --   PHOS 3.2  --   --   --   --   --   < > = values in this interval not displayed. Liver Function Tests:  Recent Labs  02/07/14 0900 02/08/14 0515 03/02/14 1036  AST 12 14 17   ALT 13 12 14   ALKPHOS 53 56 79  BILITOT 1.2 0.8 0.56  PROT 5.8* 5.5* 6.7  ALBUMIN 3.2* 3.0* 3.6   No results for input(s): LIPASE, AMYLASE in the last 8760 hours. No results for input(s): AMMONIA in the last 8760 hours. CBC:  Recent Labs  03/02/14 0822 04/09/14 1535 04/16/14 0730  WBC  20.3* 33.5* 24.7*  NEUTROABS 10.3* 18.7* 13.1*  HGB 12.4 12.7 13.0  HCT 38.9 39.2 40.7  MCV 85.3 81.3 80.8  PLT 152 161 163   TSH:  Recent Labs  01/25/14 0643  TSH 4.055   A1C: No results found for: HGBA1C Lipid Panel: No results for input(s): CHOL, HDL, LDLCALC, TRIG, CHOLHDL, LDLDIRECT in the last 8760 hours.   Assessment/Plan 1. Colon cancer s/p partial right colectomy Followed by oncology. Next appointment is next week.   2. Anxiety state Anxiety worse due to recent events with hospitalization, procedures and testing with oncology, Will start Rx for Zoloft 86m daily. Discussed side effects and when to call office. Will follow up in one month to reassess.  3. Diarrhea Resolved. Patient reports that she has not had diarrhea in the past 3 months.   4. Thickened endometrium A uterine ultrasound revealed fluid and a thickened endometrium.  Dr. FBurr Medicowill consult with Dr. MRadene Kneewith gynecology to see what needs to be done.    5. 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    6. Acute blood loss anemia Stable.  Last CBC was this month and Hgb was 13.0.   Follow up in 1 month for EV with [facility] and follow up anxiety

## 2014-04-30 ENCOUNTER — Encounter (HOSPITAL_COMMUNITY): Payer: Self-pay

## 2014-05-01 ENCOUNTER — Encounter: Payer: Self-pay | Admitting: Genetic Counselor

## 2014-05-01 DIAGNOSIS — Z1379 Encounter for other screening for genetic and chromosomal anomalies: Secondary | ICD-10-CM | POA: Insufficient documentation

## 2014-05-02 ENCOUNTER — Encounter: Payer: Self-pay | Admitting: Hematology

## 2014-05-02 NOTE — Progress Notes (Signed)
I called and spoke with hubby and gave him billing ph#. They have cancer policy. I advised him to call that company and ask all that he needs, so he can get the correct offices to get his info-bills/labs,etc.

## 2014-05-04 ENCOUNTER — Ambulatory Visit (HOSPITAL_BASED_OUTPATIENT_CLINIC_OR_DEPARTMENT_OTHER): Payer: Medicare Other | Admitting: Hematology

## 2014-05-04 ENCOUNTER — Other Ambulatory Visit (HOSPITAL_COMMUNITY)
Admission: RE | Admit: 2014-05-04 | Discharge: 2014-05-04 | Disposition: A | Discharge status: Home / Self Care | Payer: Medicare Other | Source: Ambulatory Visit | Attending: Hematology | Admitting: Hematology

## 2014-05-04 ENCOUNTER — Telehealth: Payer: Self-pay | Admitting: Genetic Counselor

## 2014-05-04 ENCOUNTER — Other Ambulatory Visit (HOSPITAL_BASED_OUTPATIENT_CLINIC_OR_DEPARTMENT_OTHER): Payer: Medicare Other

## 2014-05-04 ENCOUNTER — Telehealth: Payer: Self-pay | Admitting: Hematology

## 2014-05-04 VITALS — BP 110/61 | HR 74 | Temp 97.5°F | Resp 18 | Ht 64.5 in | Wt 138.2 lb

## 2014-05-04 DIAGNOSIS — C189 Malignant neoplasm of colon, unspecified: Secondary | ICD-10-CM

## 2014-05-04 DIAGNOSIS — D721 Eosinophilia: Secondary | ICD-10-CM

## 2014-05-04 DIAGNOSIS — D72119 Hypereosinophilic syndrome (hes), unspecified: Secondary | ICD-10-CM

## 2014-05-04 DIAGNOSIS — Z853 Personal history of malignant neoplasm of breast: Secondary | ICD-10-CM | POA: Diagnosis not present

## 2014-05-04 LAB — LACTATE DEHYDROGENASE (CC13): LDH: 193 U/L (ref 125–245)

## 2014-05-04 LAB — CBC WITH DIFFERENTIAL/PLATELET
BASO%: 1 % (ref 0.0–2.0)
BASOS ABS: 0.2 10*3/uL — AB (ref 0.0–0.1)
EOS ABS: 6 10*3/uL — AB (ref 0.0–0.5)
EOS%: 28.7 % — AB (ref 0.0–7.0)
HCT: 38.6 % (ref 34.8–46.6)
HGB: 12.7 g/dL (ref 11.6–15.9)
LYMPH#: 2.8 10*3/uL (ref 0.9–3.3)
LYMPH%: 13.2 % — ABNORMAL LOW (ref 14.0–49.7)
MCH: 26.2 pg (ref 25.1–34.0)
MCHC: 32.9 g/dL (ref 31.5–36.0)
MCV: 79.6 fL (ref 79.5–101.0)
MONO#: 0.6 10*3/uL (ref 0.1–0.9)
MONO%: 2.8 % (ref 0.0–14.0)
NEUT#: 11.3 10*3/uL — ABNORMAL HIGH (ref 1.5–6.5)
NEUT%: 54.3 % (ref 38.4–76.8)
PLATELETS: 139 10*3/uL — AB (ref 145–400)
RBC: 4.85 10*6/uL (ref 3.70–5.45)
RDW: 17 % — ABNORMAL HIGH (ref 11.2–14.5)
WBC: 20.8 10*3/uL — ABNORMAL HIGH (ref 3.9–10.3)
nRBC: 0 % (ref 0–0)

## 2014-05-04 LAB — TECHNOLOGIST REVIEW

## 2014-05-04 LAB — TROPONIN I: Troponin I: <0.03 ng/mL (ref <0.031)

## 2014-05-04 NOTE — Telephone Encounter (Signed)
Spoke with April Passe, [name]'s daughter.  Discussed TP53 mutation and the chance that this is an actual germline mutation rather than associated with either age related clonal hematopoietic expansion or something else, as [name]'s personal and family history of cancer is atypical with the presentation of TP53 mutations.  We are not aware that it is related to the eosinophil condition that her mother has.  [name] had a punch biospy, and I need to call the lab to ensure that the punch biospy could be used since it was performed on an area with eosinophils.  If it can, then we can send it to the lab for additional testing.  If not, then the family needs to determine if this is something that they want to pursue further or if they want to do targeted testing of family members to determine if it is inherited.  Also discussed that there is an MSH2 VUS and PALB2 VUS.  The colon IHC testing indicated that there could be either an MLH1 or PMS2 mutation, but testing did not confirm that, and instead found a VUS in MSH2.

## 2014-05-04 NOTE — Telephone Encounter (Signed)
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  Would need a punch biopsy from an unaffected area, or we could do targeted testing on children.  [name] will address this with her mom in the next week or so.  She feels that her mother would most likely want to do a biopsy.

## 2014-05-04 NOTE — Telephone Encounter (Signed)
Gave avs & calendar for April thru July.

## 2014-05-05 ENCOUNTER — Encounter: Payer: Self-pay | Admitting: Hematology

## 2014-05-05 NOTE — Progress Notes (Signed)
OFFICE PROGRESS NOTE  CC: Dr. Neldon Mc Dr. Carmela Rima, [facility], DO [address] [place] [address] OTHER MDS: Clayton Lefort  DIAGNOSIS: stage III colon cancer, hypereosinophilic syndrome  Colon cancer   Staging form: Colon and Rectum, AJCC 7th Edition     Clinical stage from 01/30/2014: Stage IIIB (T3, N1a, M0) - Unsigned     Colon cancer   01/28/2014 Tumor Marker CEA  14.6. Tumor MMR deficient, MSI high   01/30/2014 Initial Diagnosis Colon cancer   01/30/2014 Surgery right colon segmental resection, with clear margins.   01/30/2014 Pathologic Stage [id number], stage IIIB, 1 out of 15 lymph nodes was positive.   OTHER ISSUES: 1. 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 patient was then begun on Aromasin 25 mg daily however she was discontinued 9 days later due to the fact that she developed  grade 3 arthralgias and myalgias. 2. Leukocytosis since July 2015, with significant neutrophilia and eosinophilia,likely hypereosinophilic syndrome  3. History of thrombocytopenia when she was young  CURRENT THERAPY: observation  HISTORY OF INITIAL PRESENTATION: Mrs. Morson is a 81 year old Caucasian female, with past medical history of breast DCIS, previously under my colleague Dr. Virgie Dad care, and was transferred to me due to her recent diagnosed colon cancer.  She presented with diarrhea for 6 month and acute rectal bleeding and was admitted to hospital on 01/27/2014. She was transfused PRBCs. CT scan suggested bleeding from angiodysplasia. She was admitted for further management. She she underwent colonoscopy and EGD. Colonic mass was noted along with some abnormal duodenal findings. Biopsy of the colon mass showed adenocarcinoma. The patient underwent laproscopic R hemicolectomy on 1/5. A large bleed developed post-operatively and the patient was first transferred to the ICU and later underwent ex-lap with resection of  small bowel anastamosis and new ileocolostomy on 1/6. She was discharged to a rehab for a week and got home about one weeks ago.   She has home PT also now. She is able to walk one mile daily and all does all her ADLs. She has not gone outside yet.  She denies any pain, no nausea, her appetite has been low since surgery, she eats cheese, soup, and small regular meals, she lost 15lbs since the surgery. She has headaches for the past few month ago, and had brain MRI in the hospital which was negative.  She still has intermittent loose BM, 1-2 daily.   She was also noticed to have significant leukocytosis since July 2015, with dominantly elevated neutrophil and eosinophil. She had some cytopenia when she was young, and developed some cyst pia around her colon surgery, now resolved.  INTERIM HISTORY: She returns for follow up and discuss bone marrow biopsy results. Her skin rash  has resolved after the short course of prednisone. She feels well overall. Denies any chest pain, shortness plus, abdominal discomfort or other new symptoms. She tolerates a bone marrow biopsy well without complications.  MEDICAL HISTORY: Past Medical History  Diagnosis Date  . History of breast cancer 2011    DCIS, Right breast. No radiation, took Aromasen 2012  . Senile osteoporosis     Took Fosamax x15years  . Cancer   . Chronic diarrhea   . GERD (gastroesophageal reflux disease)   . Allergy     ALLERGIES:  is allergic to ativan; codeine; and sulfa antibiotics.  MEDICATIONS:  Current Outpatient Prescriptions  Medication Sig Dispense Refill  . acetaminophen (TYLENOL) 500  MG tablet Take 500 mg by mouth every 6 (six) hours as needed for pain (pain).    . Calcium Carbonate-Vitamin D 600-400 MG-UNIT per tablet Take 1 tablet by mouth daily.    . cholecalciferol (VITAMIN D) 1000 UNITS tablet Take 4,000 Units by mouth at bedtime.     . colestipol (MICRONIZED COLESTIPOL HCL) 1 G tablet Take 1 g by mouth daily. 1/2  tablet- titration off    . fexofenadine (ALLEGRA) 30 MG tablet Take 30 mg by mouth daily.    . Multiple Vitamins-Minerals (MULTIVITAMIN WITH MINERALS) tablet Take 1 tablet by mouth daily.    [name] OVER THE COUNTER MEDICATION Take 1 tablet by mouth daily. Macular supplement    . Probiotic Product (ALIGN PO) Take 1 tablet by mouth daily.    . ranitidine (ZANTAC) 150 MG capsule Take 1 capsule by mouth daily.  0  . sertraline (ZOLOFT) 25 MG tablet Take 1 tablet (25 mg total) by mouth daily. 30 tablet 1  . triamcinolone cream (KENALOG) 0.1 % Apply 1 application topically 2 (two) times daily as needed (Itching).   0   No current facility-administered medications for this visit.    SURGICAL HISTORY:  Past Surgical History  Procedure Laterality Date  . Laparoscopic cholecystectomy  05/26/2009  . Breast lumpectomy  12/24/2009    Right, Dr Margot Chimes  . Cataract extraction w/ intraocular lens  implant, bilateral Bilateral 8315,1761  . Tonsillectomy and adenoidectomy  1930  . Colonoscopy N/A 01/28/2014    Procedure: COLONOSCOPY;  Surgeon: Missy Sabins, MD;  Location: [facility];  Service: Endoscopy;  Laterality: N/A;  . Esophagogastroduodenoscopy N/A 01/28/2014    Procedure: ESOPHAGOGASTRODUODENOSCOPY (EGD);  Surgeon: Missy Sabins, MD;  Location: [facility];  Service: Endoscopy;  Laterality: N/A;  . Laparoscopic partial right colectomy Right 01/30/2014    Procedure: LAPAROSCOPIC PARTIAL RIGHT COLECTOMY;  Surgeon: Pedro Earls, MD;  Location: [facility];  Service: General;  Laterality: Right;  . Laparotomy N/A 01/31/2014    Procedure: EXPLORATORY LAPAROTOMY, RESECTION OF PRIOR SMALL BOWEL ANASTOMOSIS, UPPER ENDOSCOPY, CREATED A  NEW ILEO COLOSTOMY;  Surgeon: Pedro Earls, MD;  Location: [facility]S;  Service: General;  Laterality: N/A;   ROS  Constitutional: Denies fevers, chills or abnormal night sweats Eyes: Denies blurriness of vision, double vision or watery eyes Ears, nose, mouth, throat, and face: Denies  mucositis or sore throat Respiratory: Denies cough, dyspnea or wheezes Cardiovascular: Denies palpitation, chest discomfort or lower extremity swelling Gastrointestinal: Denies nausea, heartburn or change in bowel habits Skin: see HPI  Lymphatics: Denies new lymphadenopathy or easy bruising Neurological:Denies numbness, tingling or new weaknesses Behavioral/Psych: Mood is stable, no new changes  All other systems were reviewed with the patient and are negative.  PHYSICAL EXAMINATION: Filed Vitals:   05/04/14 1332  BP: 110/61  Pulse: 74  Temp: 97.5 F (36.4 C)  Resp: 18   Body mass index is 23.36 kg/(m^2).  ECOG PERFORMANCE STATUS: 2  Sclerae unicteric, pupils equal and reactive Oropharynx clear and moist-- no thrush or other lesions No cervical or supraclavicular adenopathy Lungs no rales or rhonchi Heart regular rate and rhythm Abd soft, surgical scars well healed, nontender, positive bowel sounds, no masses palpated MSK no focal spinal tenderness, no upper extremity lymphedema Neuro: nonfocal, well oriented, appropriate affect Breasts: The right breast is status post lumpectomy. There is no evidence of local recurrence. The right axilla is benign. The left breast is unremarkable. Skin: Dry skin rash has resolved. Mild skin pigmentation  from the rash.  LABORATORY DATA: Outside labs reviewed CBC Latest Ref Rng 05/04/2014 04/16/2014 04/09/2014  WBC 3.9 - 10.3 10e3/uL 20.8(H) 24.7(H) 33.5(H)  Hemoglobin 11.6 - 15.9 g/dL 12.7 13.0 12.7  Hematocrit 34.8 - 46.6 % 38.6 40.7 39.2  Platelets 145 - 400 10e3/uL 139(L) 163 161    CMP Latest Ref Rng 03/02/2014 02/09/2014 02/08/2014  Glucose 70 - 140 mg/dl 101 91 88  BUN 7.0 - 26.0 mg/dL 9.5 9 9   Creatinine 0.6 - 1.1 mg/dL 0.8 0.66 0.68  Sodium 136 - 145 mEq/L 143 137 140  Potassium 3.5 - 5.1 mEq/L 3.9 3.6 3.8  Chloride 96 - 112 mEq/L - 105 105  CO2 22 - 29 mEq/L 24 26 26   Calcium 8.4 - 10.4 mg/dL 9.0 8.3(L) 8.5  Total Protein 6.4 -  8.3 g/dL 6.7 - 5.5(L)  Total Bilirubin 0.20 - 1.20 mg/dL 0.56 - 0.8  Alkaline Phos 40 - 150 U/L 79 - 56  AST 5 - 34 U/L 17 - 14  ALT 0 - 55 U/L 14 - 12   CEA  Status: Finalresult Visible to patient:  Not Released Nextappt: 04/09/2014 at 02:00 PM in Oncology Florene Glen, Emi [place], [place]) Dx:  Colon cancer           Ref Range 3wk ago  16moago     CEA 0.0 - 5.0 ng/mL 2.2 14.6 (H)CM   Resulting Agency  [facility]          Pathology reports: Colon, segmental resection for tumor, partial right 01/30/2014  1 of 3 FINAL for Nyman, Jonica A (([id number] Diagnosis(continued) INVASIVE ADENOCARCINOMA, INVADING THROUGH THE MUSCULARIS PROPRIA INTO PERICOLONIC FATTY TISSUE. ONE OF FIFTEEN LYMPH NODES, POSITIVE FOR METASTATIC ADENOCARCINOMA (1/15). RESECTION MARGINS, NEGATIVE FOR TUMOR. APPENDIX: BENIGN APPENDICEAL TISSUE, NO EVIDENCE OF ACTIVE INFLAMMATION OR NEOPLASM. Specimen: Right colon. Procedure: Segmental resection Tumor site: Ascending colon Specimen integrity: Intact Macroscopic intactness of mesorectum: Not applicable. Macroscopic tumor perforation: N/A Invasive tumor: Maximum size: 5.0 cm Histologic type(s): Invasive adenocarcinoma with extracellular mucin Histologic grade and differentiation: G2: moderately differentiated/low grade Type of polyp in which invasive carcinoma arose: N/A Microscopic extension of invasive tumor: invading through muscularis propria into pericolonic fatty tissue. Lymph-Vascular invasion: Not identified Peri-neural invasion: No Tumor deposit(s) (discontinuous extramural extension): No Resection margins: Negative Proximal margin: 10 cm Distal margin: 9 cm Circumferential (radial) (posterior ascending, posterior descending; lateral and posterior mid-rectum; and entire lower 1/3 rectum): 3 cm Treatment effect (neo-adjuvant therapy): No Additional polyp(s): N/A Non-neoplastic findings: N/A Lymph nodes: number  examined 1; number positive: 15 Pathologic Staging: p T3, N1a, Mx Ancillary studies: MMR stains will be RADIOGRAPHIC STUDIES:  COMPARISON: Multiple priors  ACR Breast Density Category b: There are scattered areas of  fibroglandular density.  FINDINGS:  The patient has had a right lumpectomy. No new mass or suspicious  calcifications are seen in either breast. Compared to priors.  Mammographic images were processed with CAD.  IMPRESSION:  No mammographic evidence of local recurrence, right breast. No  mammographic evidence of malignancy, left breast.  RECOMMENDATION:  Diagnostic mammography in 1 year.  I have discussed the findings and recommendations with the patient.  Results were also provided in writing at the conclusion of the  visit. If applicable, a reminder letter will be sent to the patient  regarding the next appointment.  BI-RADS CATEGORY 1: Negative  Electronically Signed  By: KDuke SalviaM.D.  On: 12/02/2012 11:47   ADDITIONAL INFORMATION: Mismatch Repair (MMR) Protein Immunohistochemistry (IHC)  IHC Expression Result: MLH1: LOSS OF NUCLEAR EXPRESSION (LESS THAN 5% TUMOR EXPRESSION) MSH2: Preserved nuclear expression (greater 50% tumor expression) MSH6: Preserved nuclear expression (greater 50% tumor expression) PMS2: LOSS OF NUCLEAR EXPRESSION (LESS THAN 5% TUMOR EXPRESSION) * Internal control demonstrates intact nuclear expression Interpretation: ABNORMAL There is loss of the major and minor MMR proteins MLH1 and PMS2. The loss of expression may be secondary to promoter hyper-methylation, gene mutation or other genetic event. BRAF mutation testing and/or MLH1 methylation testing is indicated. The presence of a BRAF mutation and/or MLH1 hyper-methylation is indicative of a sporadic-type tumor. The absence of either BRAF mutation and/or presence of normal-methylation indicate the possible presence of a hereditary germline mutation (e.g. Lynch syndrome) and  referral to genetic counseling is warranted. It is recommended that the loss of protein expression be correlated with molecular based MSI testing.   will  Bone Marrow, Aspirate,Biopsy, and Clot, right iliac 04/16/2014 - HYPERCELLULAR BONE MARROW FOR AGE WITH GRANULOCYTIC PROLIFERATION INCLUDING NEUTROPHILIA AND EOSINOPHILIA. - MEGAKARYOCYTIC PROLIFERATION. - SEE COMMENT. PERIPHERAL BLOOD: - LEUKOCYTOSIS WITH NEUTROPHILIA, EOSINOPHILIA AND BASOPHILIA. Diagnosis Note It is unclear whether the morphologic features represent a secondary or a primary clonal process particularly a myeloproliferative myeloproliferative/myelodysplastic process with eosinophilia. Previous FISH studies performed on peripheral blood including PDGFRA, PGDFRB, FGFR1, BCR-ABL, and CBFB are all negative. Selected FISH studies will be repeated on bone marrow material and the results reported under separate cover. There is no increase in blastic cells or evidence of metastatic carcinoma in this material.    ASSESSMENT: 81 y.o. [place] woman  1. Hypereosinophilia syndrome (HES)  -Her WBC was normal until June 2015. Her total WBC has been in the range of 20-30 K, with predominant neutrophils and eosinophils. Her basophil has been slightly elevated also. Her absolute eosinophils count is in the range 6-8K/ul -Her bone marrow was hypocellular, with granular cystic proliferation including neutrophilia and eosinophilia. The eosinophil count in bone marrow was 14%  -BCR/ABL and JAK2 gene mutation( -). -Both blood and bone marrow FISH testing for PDGFRA, PGDFRB, FGFR1, BCR-ABL, and CBFB are all negative. -Her skin rash biopsy showed perivascular eosinophil infiltrates, which is supported for hypereosinophil syndrome -Giving the significant elevated eosinophil counts and skin involvement, she meets the criteria for hypereosinophilia syndrome. However reactive eosinophilia is not ruled out. -Her chest CT and echocardiogram were  unremarkable.  -HES patients with skin urticaria, has less cardiac and neuro manifestations, and has a better prognosis. -She responded to steroids very well, off now. May consider low-dose prednisone(10-78m daily for 1-2 weeks) if she has recurrent skin rash. -Giving the negative PDGFR, she would not benefit from GHavana  2.  Right colon cancer, pT3N1aM0, stage IIIB, G2, MSI high -I reviewed her surgical past findings with her and her husband, daughter in great details.  -I reviewed her staging CT of chest, which is negative for metastasis. -Given her locally advanced stage and elevated preoperative CEA, she has high risks of cancer recurrence, approximately 50%, especially in the first few years. -I discussed that the standard care is adjuvant chemotherapy with FOLFOX for stage III colon cancer. However given her advanced age, limited performance status after surgery, I do not think she is a good candidate for adjuvant chemotherapy. Her tumor has high microsatellite instability, which is a good prognostic marker, also predicts a poor response to single agent 5-FU.  -After lengthy discussion, we agreed to observe her closely after surgery. I discussed the surveillance plan, with lab and physical exam every 3-4 months,  repeat colonoscopy and CT scan in one year. -We also discussed the treatment option if she has cancer recurrence in the future, which includes surgery, systemic therapy especially immunotherapy for metastatic disease. Clinical data has shown to accelerate response (70%) with PD1 antibody to colon cancer with MSI high.   3. GENETICS -She underwent genetic test, which was negative for Lynch syndrome. 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  3. History of right breast DCIS, ER/PR positive. -The patient previously couldn't tolerate aromatase inhibitor.  -No evidence of disease  recurrence on exam. -Continue observation  -Continue yearly mammogram  Follow-up:  -Monthly CBC, return to clinic in 3 months  I spent 40 minutes, more than 50% time face to face counseling for her visit.  Truitt Merle, MD

## 2014-05-07 ENCOUNTER — Encounter: Payer: Self-pay | Admitting: Genetic Counselor

## 2014-05-07 DIAGNOSIS — Z853 Personal history of malignant neoplasm of breast: Secondary | ICD-10-CM

## 2014-05-07 DIAGNOSIS — D72119 Hypereosinophilic syndrome (hes), unspecified: Secondary | ICD-10-CM

## 2014-05-07 DIAGNOSIS — D721 Eosinophilia: Secondary | ICD-10-CM

## 2014-05-07 DIAGNOSIS — C189 Malignant neoplasm of colon, unspecified: Secondary | ICD-10-CM

## 2014-05-07 DIAGNOSIS — Z1379 Encounter for other screening for genetic and chromosomal anomalies: Secondary | ICD-10-CM

## 2014-05-07 NOTE — Progress Notes (Signed)
HPI: April [name] was previously seen in the [place] clinic due to a personal history of breast and colon cancer and family  history of cancer and concerns regarding a hereditary predisposition to cancer. Please refer to our prior cancer genetics clinic note for more information regarding April [name]'s medical, social and family histories, and our assessment and recommendations, at the time. April [name] recent genetic test results were disclosed to her, as were recommendations warranted by these results. These results and recommendations are discussed in more detail below.  GENETIC TEST RESULTS: At the time of April [name]'s visit, we recommended she pursue genetic testing of the Breast/Ovarian cancer gene panel. The Breast/Ovarian gene panel offered by [facility] includes sequencing and rearrangement analysis for the following 21 genes:  ATM, BARD1, BRCA1, BRCA2, BRIP1, CDH1, CHEK2, EPCAM, FANCC, MLH1, MSH2, MSH6, NBN, PALB2, PMS2, PTEN, RAD51C, RAD51D, STK11, TP53, and XRCC2.   The report date is April 30, 2014.  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 The test report has been scanned into EPIC and is located under the Media tab.   This result was conveyed to April [name]'s [name], April [name].  We discussed that April [name]'s age of onset of cancer, in addition to the ages of onset of cancer and types of cancer in her family, suggest that this may be due to other events. This could include April [name]'s hypereosinophilic syndrome.  In order to clarify this test result, and provide the family the most accurate information, a punch biospy to obtain fibroblasts will need to be performed.  In speaking with April [name], there is a biopsy that was performed to diagnosed the hypereosinophilic syndrome, however, this biopsy may not be the best one to use.  Using a skin sample that is free of the rash associated with the eosinophilic area, would be best.  April [name] will speak with her mother and siblings to  determine how they want to proceed.  Genetic testing did detect a Variant of Unknown Significance in the MSH2 and PALB2 genes called c.138C>G and c.1072C>T, respectively. At this time, it is unknown if these variants are associated with increased cancer risk or if this is a normal finding, but most variants such as these get reclassified to being inconsequential. It should not be used to make medical management decisions. With time, we suspect the lab will determine the significance of this variant, if any. If we do learn more about it, we will try to contact April [name] to discuss it further. However, it is important to stay in touch with [place] periodically and keep the address and phone number up to date.   April [name] had IHC testing that found loss of heterozygosity in MLH1 and PMS2.  The VUS was found in MSH2 and therefore is not consistent with her tumor testing.  CANCER SCREENING RECOMMENDATIONS: This result needs further testing to determine any risk for other family members.  If further testing suggests that April [name] has a TP53 mutation, then additional testing of her children and nieces and nephews could be performed.  In the meantime, we recommended she continue to follow the cancer management and screening guidelines provided by her oncology and primary providers.   RECOMMENDATIONS FOR FAMILY MEMBERS: Women in this family might be at some increased risk of developing cancer, over the general population risk, simply due to the family history of cancer. We recommended women in this family have a yearly mammogram beginning at age 65, or 75 years younger than  the earliest onset of cancer, an an annual clinical breast exam, and perform monthly breast self-exams. Women in this family should also have a gynecological exam as recommended by their primary provider. All family members should have a colonoscopy by age 65.  These recommendations may change based on the final diagnosis of the genetic  testing.  FOLLOW-UP: Lastly, we discussed with April [name] that cancer genetics is a rapidly advancing field and it is possible that new genetic tests will be appropriate for her and/or her family members in the future. We encouraged her to remain in contact with cancer genetics on an annual basis so we can update her personal and family histories and let her know of advances in cancer genetics that may benefit this family.   Our contact number was provided. April [name] questions were answered to her satisfaction, and she knows she is welcome to call us at anytime with additional questions or concerns.   April Kayser, MS, [facility] Certified Genetic Counselor April [name]

## 2014-05-10 ENCOUNTER — Encounter (HOSPITAL_BASED_OUTPATIENT_CLINIC_OR_DEPARTMENT_OTHER): Payer: Self-pay | Admitting: *Deleted

## 2014-05-11 NOTE — H&P (Signed)
NAME:  April [name], CINELLI NO.:  [id number]  MEDICAL RECORD NO.:  [id number]  LOCATION:                                 FACILITY:  PHYSICIAN:  Darlyn Chamber, M.D.   DATE OF BIRTH:  Jun 15, 1925  DATE OF ADMISSION:  05/16/2014 DATE OF DISCHARGE:                             HISTORY & PHYSICAL   Date of surgery April 20th at [facility] outpatient area on [address].  HISTORY OF PRESENT ILLNESS:  The patient is an 81 year old postmenopausal patient, who is referred by Dr. Rollene Rotunda for evaluation of a thickened endometrium.  It was noted that she was recently diagnosed with colon cancer due to rectal bleeding.  A CT scan and ultrasound revealed thickened endometrium.  She came in for followup.  She did have a laparoscopic hemicolectomy done on January 30, 2014.  Did have a postoperative bleed from the anastomosis.  This required repeat exploratory surgery the next day.  She required massive transfusions during that.  We had performed an ultrasound here in the office on April 26, 2014.  She had endometrial cavity filled with fluid.  The fluid measured 4.2 x 2 cm __________unremarkable.  We tried to do an endometrial sampling, but the cervix was extremely stenotic, and the patient was too uncomfortable to proceed, therefore we are going to proceed with anesthesia, cervical dilation, and hysteroscopy with the MyoSure.  ALLERGIES:  She is allergic to ATIVAN, CODEINE, and SULFA ANTIBIOTICS.  MEDICATIONS:  She is on colestipol 1 g daily and supplements.  PAST MEDICAL HISTORY:  She has a history of hypertension under active management and a history of both breast and colon cancer.  Breast cancer was ductal carcinoma in situ of the right breast.  PAST SURGICAL HISTORY:  She has had a laparoscopic cholecystectomy.  A breast lumpectomy.  Cataract extraction with intraocular lens implant. These were done bilaterally.  She has had tonsillectomy and adenoidectomy.  She  has a laparoscopic partial colectomy.  Because of bleeding, underwent exploratory surgery with revision of the anastomosis.  SOCIAL HISTORY:  No alcohol or tobacco use.  FAMILY HISTORY:  History of heart disease, diabetes, and breast cancer.  REVIEW OF SYSTEMS:  Noncontributory.  PHYSICAL EXAMINATION:  VITAL SIGNS:  The patient is afebrile with stable vital signs. HEENT:  The patient is normocephalic.  Pupils equal, round, and reactive to light accommodation.  Extraocular movements were intact.  Sclerae and conjunctivae clear.  Oropharynx clear. NECK:  Not examined. LUNGS:  Clear. CARDIOVASCULAR:  Regular rhythm and rate.  There are no murmurs or gallops. ABDOMEN:  Benign.  Previous incision scars are noted. PELVIC:  Normal external genitalia.  Vaginal mucosa is clear.  Cervix unremarkable.  Uterus normal size, shape, contour.  Adnexa free of mass or tenderness. EXTREMITIES:  Trace edema. NEUROLOGIC:  Grossly within normal limits.  IMPRESSION: 1. Postmenopausal patient with a fluid collection within the     endometrial cavity. 2. History of colon cancer with a laparoscopic partial colectomy with     subsequent postop bleed requiring exploratory surgery revision. 3. History of breast cancer. 4. Hypertension.  PLAN:  The patient undergo cervical dilatation with hysteroscopy with MyoSure.  Risks of surgery have been explained including the risk of infection.  The risk of hemorrhage that could require transfusion with the risk of AIDS, hepatitis, excessive bleeding could require hysterectomy.  There is a risk of uterine perforation with injury to adjacent organs, requiring laparoscopy exploratory surgery.  Risk of deep venous thrombosis and pulmonary embolus.  The patient expressed understanding of indications and risks.     Darlyn Chamber, M.D.     JSM/MEDQ  D:  05/11/2014  T:  05/11/2014  Job:  [id number]

## 2014-05-11 NOTE — H&P (Signed)
  Patient name  April [name], [name] DICTATION# [id number] CSN# [id number]  [facility], MD 05/11/2014 9:22 AM

## 2014-05-14 ENCOUNTER — Encounter (HOSPITAL_BASED_OUTPATIENT_CLINIC_OR_DEPARTMENT_OTHER): Payer: Self-pay | Admitting: *Deleted

## 2014-05-14 NOTE — Progress Notes (Signed)
SPOKE W/ DAUGHTER, BETSY, AS PER SUGGESTION FROM OFFICE BECAUSE PT IS VERY HOH EVEN WITH HEARING AIDS. SUGGEST DAUGHTER BE IN PRE-OP, SHE IS ALSO HCPOA.  NPO AFTER MN. ARRIVE AT 0700. CURRENT CBC IN CHART AND EPIC. WILL TAKE ALLEGRA, ZOLOFT, ZANTAC, AND PROBIOTIC AM DOS W/ SIPS OF WATER.

## 2014-05-16 ENCOUNTER — Encounter (HOSPITAL_BASED_OUTPATIENT_CLINIC_OR_DEPARTMENT_OTHER)
Admission: RE | Disposition: A | Discharge status: Home / Self Care | Payer: Self-pay | Source: Ambulatory Visit | Attending: Obstetrics and Gynecology

## 2014-05-16 ENCOUNTER — Ambulatory Visit (HOSPITAL_BASED_OUTPATIENT_CLINIC_OR_DEPARTMENT_OTHER)
Admission: RE | Admit: 2014-05-16 | Discharge: 2014-05-16 | Disposition: A | Discharge status: Home / Self Care | Payer: Medicare Other | Source: Ambulatory Visit | Attending: Obstetrics and Gynecology | Admitting: Obstetrics and Gynecology

## 2014-05-16 ENCOUNTER — Ambulatory Visit (HOSPITAL_BASED_OUTPATIENT_CLINIC_OR_DEPARTMENT_OTHER): Payer: Medicare Other | Admitting: Anesthesiology

## 2014-05-16 ENCOUNTER — Encounter (HOSPITAL_BASED_OUTPATIENT_CLINIC_OR_DEPARTMENT_OTHER): Payer: Self-pay | Admitting: *Deleted

## 2014-05-16 ENCOUNTER — Telehealth: Payer: Self-pay | Admitting: *Deleted

## 2014-05-16 DIAGNOSIS — Z79899 Other long term (current) drug therapy: Secondary | ICD-10-CM | POA: Insufficient documentation

## 2014-05-16 DIAGNOSIS — N9971 Accidental puncture and laceration of a genitourinary system organ or structure during a genitourinary system procedure: Secondary | ICD-10-CM | POA: Insufficient documentation

## 2014-05-16 DIAGNOSIS — Y838 Other surgical procedures as the cause of abnormal reaction of the patient, or of later complication, without mention of misadventure at the time of the procedure: Secondary | ICD-10-CM | POA: Insufficient documentation

## 2014-05-16 DIAGNOSIS — R938 Abnormal findings on diagnostic imaging of other specified body structures: Secondary | ICD-10-CM | POA: Diagnosis present

## 2014-05-16 DIAGNOSIS — Z853 Personal history of malignant neoplasm of breast: Secondary | ICD-10-CM | POA: Insufficient documentation

## 2014-05-16 DIAGNOSIS — N859 Noninflammatory disorder of uterus, unspecified: Secondary | ICD-10-CM | POA: Diagnosis present

## 2014-05-16 DIAGNOSIS — Y92530 Ambulatory surgery center as the place of occurrence of the external cause: Secondary | ICD-10-CM | POA: Insufficient documentation

## 2014-05-16 DIAGNOSIS — Z780 Asymptomatic menopausal state: Secondary | ICD-10-CM | POA: Diagnosis not present

## 2014-05-16 DIAGNOSIS — I10 Essential (primary) hypertension: Secondary | ICD-10-CM | POA: Insufficient documentation

## 2014-05-16 DIAGNOSIS — Z85038 Personal history of other malignant neoplasm of large intestine: Secondary | ICD-10-CM | POA: Insufficient documentation

## 2014-05-16 DIAGNOSIS — N858 Other specified noninflammatory disorders of uterus: Secondary | ICD-10-CM | POA: Insufficient documentation

## 2014-05-16 HISTORY — DX: Presence of external hearing-aid: Z97.4

## 2014-05-16 HISTORY — DX: Eosinophilia: D72.1

## 2014-05-16 HISTORY — DX: Hypereosinophilic syndrome (hes), unspecified: D72.119

## 2014-05-16 HISTORY — DX: Diverticulosis of large intestine without perforation or abscess without bleeding: K57.30

## 2014-05-16 HISTORY — DX: Malignant neoplasm of colon, unspecified: C18.9

## 2014-05-16 HISTORY — PX: DILATATION & CURRETTAGE/HYSTEROSCOPY WITH RESECTOCOPE: SHX5572

## 2014-05-16 HISTORY — DX: Postmenopausal bleeding: N95.0

## 2014-05-16 HISTORY — PX: LAPAROSCOPY: SHX197

## 2014-05-16 LAB — CBC WITH DIFFERENTIAL/PLATELET
Basophils Absolute: 0.2 10*3/uL — ABNORMAL HIGH (ref 0.0–0.1)
Basophils Relative: 1 % (ref 0–1)
EOS ABS: 7 10*3/uL — AB (ref 0.0–0.7)
Eosinophils Relative: 30 % — ABNORMAL HIGH (ref 0–5)
HEMATOCRIT: 40.5 % (ref 36.0–46.0)
Hemoglobin: 12.7 g/dL (ref 12.0–15.0)
LYMPHS PCT: 14 % (ref 12–46)
Lymphs Abs: 3.3 10*3/uL (ref 0.7–4.0)
MCH: 25.1 pg — ABNORMAL LOW (ref 26.0–34.0)
MCHC: 31.4 g/dL (ref 30.0–36.0)
MCV: 80.2 fL (ref 78.0–100.0)
Monocytes Absolute: 0.7 10*3/uL (ref 0.1–1.0)
Monocytes Relative: 3 % (ref 3–12)
Neutro Abs: 12.1 10*3/uL — ABNORMAL HIGH (ref 1.7–7.7)
Neutrophils Relative %: 52 % (ref 43–77)
PLATELETS: 151 10*3/uL (ref 150–400)
RBC: 5.05 MIL/uL (ref 3.87–5.11)
RDW: 16.9 % — AB (ref 11.5–15.5)
WBC: 23.3 10*3/uL — ABNORMAL HIGH (ref 4.0–10.5)

## 2014-05-16 SURGERY — DILATATION & CURETTAGE/HYSTEROSCOPY WITH RESECTOCOPE
Anesthesia: General | Site: Uterus

## 2014-05-16 MED ORDER — ROCURONIUM BROMIDE 100 MG/10ML IV SOLN
INTRAVENOUS | Status: DC | PRN
  Administered 2014-05-16: 25 mg via INTRAVENOUS

## 2014-05-16 MED ORDER — NEOSTIGMINE METHYLSULFATE 10 MG/10ML IV SOLN
INTRAVENOUS | Status: DC | PRN
  Administered 2014-05-16: 4 mg via INTRAVENOUS

## 2014-05-16 MED ORDER — PROPOFOL 10 MG/ML IV BOLUS
INTRAVENOUS | Status: DC | PRN
  Administered 2014-05-16: 120 mg via INTRAVENOUS

## 2014-05-16 MED ORDER — FENTANYL CITRATE (PF) 100 MCG/2ML IJ SOLN
INTRAMUSCULAR | Status: DC | PRN
  Administered 2014-05-16 (×4): 25 ug via INTRAVENOUS

## 2014-05-16 MED ORDER — HYDROMORPHONE HCL 2 MG PO TABS: 2.0000 mg | ORAL_TABLET | ORAL | Status: DC | PRN

## 2014-05-16 MED ORDER — CEFAZOLIN SODIUM-DEXTROSE 2-3 GM-% IV SOLR
2.0000 g | INTRAVENOUS | Status: AC
  Administered 2014-05-16: 2 g via INTRAVENOUS
  Filled 2014-05-16: qty 50

## 2014-05-16 MED ORDER — FENTANYL CITRATE (PF) 100 MCG/2ML IJ SOLN
25.0000 ug | INTRAMUSCULAR | Status: DC | PRN
  Administered 2014-05-16: 25 ug via INTRAVENOUS
  Filled 2014-05-16: qty 1

## 2014-05-16 MED ORDER — PHENYLEPHRINE HCL 10 MG/ML IJ SOLN
INTRAMUSCULAR | Status: DC | PRN
  Administered 2014-05-16: 40 ug via INTRAVENOUS

## 2014-05-16 MED ORDER — ONDANSETRON HCL 4 MG/2ML IJ SOLN
INTRAMUSCULAR | Status: DC | PRN
  Administered 2014-05-16: 4 mg via INTRAVENOUS

## 2014-05-16 MED ORDER — LACTATED RINGERS IR SOLN
Status: DC | PRN
  Administered 2014-05-16: 3000 mL

## 2014-05-16 MED ORDER — GLYCINE 1.5 % IR SOLN
Status: DC | PRN
  Administered 2014-05-16: 3000 mL

## 2014-05-16 MED ORDER — GLYCOPYRROLATE 0.2 MG/ML IJ SOLN
INTRAMUSCULAR | Status: DC | PRN
  Administered 2014-05-16: 0.6 mg via INTRAVENOUS

## 2014-05-16 MED ORDER — ACETAMINOPHEN 10 MG/ML IV SOLN
INTRAVENOUS | Status: DC | PRN
  Administered 2014-05-16: 1000 mg via INTRAVENOUS

## 2014-05-16 MED ORDER — FENTANYL CITRATE (PF) 100 MCG/2ML IJ SOLN
INTRAMUSCULAR | Status: AC
  Filled 2014-05-16: qty 4

## 2014-05-16 MED ORDER — MEPERIDINE HCL 25 MG/ML IJ SOLN
6.2500 mg | INTRAMUSCULAR | Status: DC | PRN
  Filled 2014-05-16: qty 1

## 2014-05-16 MED ORDER — LACTATED RINGERS IV SOLN
INTRAVENOUS | Status: DC
  Administered 2014-05-16 (×2): via INTRAVENOUS
  Filled 2014-05-16: qty 1000

## 2014-05-16 MED ORDER — FENTANYL CITRATE (PF) 100 MCG/2ML IJ SOLN
INTRAMUSCULAR | Status: AC
  Filled 2014-05-16: qty 2

## 2014-05-16 MED ORDER — OXYCODONE HCL 5 MG PO TABS
5.0000 mg | ORAL_TABLET | Freq: Once | ORAL | Status: AC
  Administered 2014-05-16: 5 mg via ORAL
  Filled 2014-05-16: qty 1

## 2014-05-16 MED ORDER — CEFAZOLIN SODIUM-DEXTROSE 2-3 GM-% IV SOLR
INTRAVENOUS | Status: AC
  Filled 2014-05-16: qty 50

## 2014-05-16 MED ORDER — OXYCODONE HCL 5 MG PO TABS
ORAL_TABLET | ORAL | Status: AC
  Filled 2014-05-16: qty 1

## 2014-05-16 MED ORDER — LIDOCAINE HCL (CARDIAC) 20 MG/ML IV SOLN
INTRAVENOUS | Status: DC | PRN
  Administered 2014-05-16: 40 mg via INTRAVENOUS

## 2014-05-16 MED ORDER — CHLOROPROCAINE HCL 1 % IJ SOLN
INTRAMUSCULAR | Status: DC | PRN
  Administered 2014-05-16: 20 mL

## 2014-05-16 MED ORDER — DEXAMETHASONE SODIUM PHOSPHATE 4 MG/ML IJ SOLN
INTRAMUSCULAR | Status: DC | PRN
  Administered 2014-05-16: 5 mg via INTRAVENOUS

## 2014-05-16 MED ORDER — PROMETHAZINE HCL 25 MG/ML IJ SOLN
6.2500 mg | INTRAMUSCULAR | Status: DC | PRN
  Filled 2014-05-16: qty 1

## 2014-05-16 SURGICAL SUPPLY — 42 items
CANISTER SUCTION 2500CC (MISCELLANEOUS) ×6 IMPLANT
CATH ROBINSON RED A/P 16FR (CATHETERS) ×2 IMPLANT
CHLORAPREP W/TINT 26ML (MISCELLANEOUS) ×2 IMPLANT
CORD ACTIVE DISPOSABLE (ELECTRODE) ×2
CORD ELECTRO ACTIVE DISP (ELECTRODE) IMPLANT
COVER BACK TABLE 60X90IN (DRAPES) ×4 IMPLANT
COVER MAYO STAND STRL (DRAPES) ×2 IMPLANT
DRAPE LG THREE QUARTER DISP (DRAPES) ×4 IMPLANT
DRAPE PED LAPAROTOMY (DRAPES) ×2 IMPLANT
DRSG TELFA 3X8 NADH (GAUZE/BANDAGES/DRESSINGS) ×4 IMPLANT
ELECT LOOP GYNE PRO 24FR (CUTTING LOOP) ×4
ELECT REM PT RETURN 9FT ADLT (ELECTROSURGICAL) ×4
ELECTRODE LOOP GYNE PRO 24FR (CUTTING LOOP) ×2 IMPLANT
ELECTRODE REM PT RTRN 9FT ADLT (ELECTROSURGICAL) ×2 IMPLANT
GLOVE BIO SURGEON STRL SZ7 (GLOVE) ×8 IMPLANT
GLYCINE 1.5% IRRIG UROMATIC (IV SOLUTION) ×2 IMPLANT
GOWN STRL REUS W/ TWL LRG LVL3 (GOWN DISPOSABLE) ×4 IMPLANT
GOWN STRL REUS W/TWL LRG LVL3 (GOWN DISPOSABLE) ×16
IV LACTATED RINGER IRRG 3000ML (IV SOLUTION) ×4
IV LR IRRIG 3000ML ARTHROMATIC (IV SOLUTION) IMPLANT
LEGGING LITHOTOMY PAIR STRL (DRAPES) ×4 IMPLANT
LIQUID BAND (GAUZE/BANDAGES/DRESSINGS) ×2 IMPLANT
NDL SPNL 18GX3.5 QUINCKE PK (NEEDLE) ×2 IMPLANT
NEEDLE INSUFFLATION 120MM (ENDOMECHANICALS) ×2 IMPLANT
NEEDLE SPNL 18GX3.5 QUINCKE PK (NEEDLE) ×4 IMPLANT
PACK BASIN DAY SURGERY FS (CUSTOM PROCEDURE TRAY) ×6 IMPLANT
PAD DRESSING TELFA 3X8 NADH (GAUZE/BANDAGES/DRESSINGS) ×2 IMPLANT
PAD OB MATERNITY 4.3X12.25 (PERSONAL CARE ITEMS) ×4 IMPLANT
PAD PREP 24X48 CUFFED NSTRL (MISCELLANEOUS) ×4 IMPLANT
SET IRRIG TUBING LAPAROSCOPIC (IRRIGATION / IRRIGATOR) ×2 IMPLANT
SHEET LAVH (DRAPES) ×2 IMPLANT
SUT VIC AB 4-0 PS2 18 (SUTURE) ×2 IMPLANT
SUT VICRYL 0 UR6 27IN ABS (SUTURE) ×2 IMPLANT
SYR CONTROL 10ML LL (SYRINGE) ×4 IMPLANT
TOWEL OR 17X24 6PK STRL BLUE (TOWEL DISPOSABLE) ×8 IMPLANT
TRAY DSU PREP LF (CUSTOM PROCEDURE TRAY) ×4 IMPLANT
TROCAR BALLN 12MMX100 BLUNT (TROCAR) ×2 IMPLANT
TROCAR OPTI TIP 5M 100M (ENDOMECHANICALS) ×2 IMPLANT
TUBING AQUILEX INFLOW (TUBING) ×2 IMPLANT
TUBING AQUILEX OUTFLOW (TUBING) ×2 IMPLANT
TUBING INSUFFLATION 10FT LAP (TUBING) ×2 IMPLANT
WATER STERILE IRR 500ML POUR (IV SOLUTION) ×4 IMPLANT

## 2014-05-16 NOTE — Transfer of Care (Signed)
Last Vitals:  Filed Vitals:   05/16/14 0720  BP: 121/51  Pulse: 80  Temp: 36.4 C  Resp: 12    Immediate Anesthesia Transfer of Care Note  Patient: April [name]  Procedure(s) Performed: Procedure(s) (LRB): DILATATION & CURETTAGE/HYSTEROSCOPY WITH RESECTOCOPE (N/A) LAPAROSCOPY DIAGNOSTIC (N/A)  Patient Location: PACU  Anesthesia Type: General  Level of Consciousness: awake, alert  and oriented  Airway & Oxygen Therapy: Patient Spontanous Breathing and Patient connected to nasal cannula oxygen  Post-op Assessment: Report given to PACU RN and Post -op Vital signs reviewed and stable  Post vital signs: Reviewed and stable  Complications: No apparent anesthesia complications

## 2014-05-16 NOTE — H&P (Signed)
  History and physical exam unchanged 

## 2014-05-16 NOTE — Op Note (Signed)
NAME:  April [name], April [name] NO.:  [id number]  MEDICAL RECORD NO.:  [id number]  LOCATION:  MLAB                         FACILITY:  [facility]  PHYSICIAN:  Darlyn Chamber, M.D.   DATE OF BIRTH:  04-12-1925  DATE OF PROCEDURE:  05/16/2014 DATE OF DISCHARGE:  05/04/2014                              OPERATIVE REPORT   PREOPERATIVE DIAGNOSIS:  Intrauterine collection of fluid in a postmenopausal patient.  POSTOPERATIVE DIAGNOSIS:  Intrauterine collection of fluid in a postmenopausal patient with addition of posterior wall perforation using a resectoscope.  OPERATIVE PROCEDURE: 1. Cervical dilatation, hysteroscopy. 2. Perforation of posterior uterine wall. 3. Open laparoscopy. 4. Cautery of perforated end site. 5. No evidence of bowel injury.  SURGEON:  Darlyn Chamber, M.D.  ANESTHESIA:  General endotracheal.  ESTIMATED BLOOD LOSS:  Minimal.  PACKS:  None.  DRAINS:  None.  INTRAOPERATIVE BLOOD PLACEMENT:  None.  COMPLICATIONS:  As noted.  PROCEDURE IN DETAIL:  The patient was taken to OR, placed in the supine position.  After satisfactory level of general anesthesia was obtained, the patient was placed in dorsal position using Allen stirrups. Perineum vagina prepped out with Betadine and draped in sterile field. A speculum was placed in vaginal vault.  The cervix was grasped with single-tooth tenaculum.  Paracervical block of 2% Nesacaine was used. Using a small dilator, we were eventually able to dilate the very stenotic cervix.  We continued dilation to a size 35 Pratt dilator. Operative  hysteroscope was introduced.  On visualization, very smooth atrophic endometrium.  The fluid did come out was a thick whitish fluid consistent with just normal excretions.  There was no blood noted.  We used the resectoscope for doing a posterior wall resection and it was obviously that it perforated through slightly, we noticed that because of the rapid loss of fluid, and I  could visualize slightly through the opening and see some pericolonic fat.  At this point in time, we did endometrial curettings.  Those were sent off.  Again endometrial lining was very smooth and atrophic.  We removed the hysteroscope, and we put in a Hulka tenaculum.  We emptied the bladder by in and out catheterization.  The patient was re-prepped and draped.  A subumbilical  incision was made with a knife.  Perineum was entered sharply.  The open laparoscopic trocar was put in place.  The abdomen was deflated with carbon dioxide. Laparoscope was introduced.  There was no evidence of injury to adjacent organs.  A 5-mm trocar was put in place in  suprapubic area.  Uterus was elevated.  We irrigated the pelvis, small amount of oozing was coming from the perforation site that could be seen.  Colon was behind the uterus and was somewhat adherent to the cul-de-sac area.  We closely examined the entire area behind the uterus.  There was no signs of any burn to the bowel or any perforation.  We then irrigated and again no evidence of any perforation or damage.  We used the bipolar to bring hemostasis to the posterior wall perforation site in the uterus, and again we revisited the bowel thoroughly and there was no evidence of  any damage whatsoever.  At this point in time, the abdomen was deflated with carbon dioxide.  The open laparoscopic trocar was removed.  The umbilical fascia closed with figure-of-eight of 0 Vicryl.  Skin was closed with interrupted subcuticular 4-0 Vicryl and Dermabond. Suprapubic incision was closed with Dermabond.  The Hulka tenaculum was then removed.  The patient was taken out of the dorsal lithotomy position.  Once alert and extubated, transferred to recovery room in good condition.  Sponge, instrument, and needle count was correct by circulating nurse x2.     Darlyn Chamber, M.D.     JSM/MEDQ  D:  05/16/2014  T:  05/16/2014  Job:  [id number]

## 2014-05-16 NOTE — Anesthesia Procedure Notes (Addendum)
Procedure Name: LMA Insertion Date/Time: 05/16/2014 8:42 AM Performed by: Mechele Claude Pre-anesthesia Checklist: Patient identified, Emergency Drugs available, Suction available and Patient being monitored Patient Re-evaluated:Patient Re-evaluated prior to inductionOxygen Delivery Method: Circle System Utilized Preoxygenation: Pre-oxygenation with 100% oxygen Intubation Type: IV induction Ventilation: Mask ventilation without difficulty LMA: LMA inserted LMA Size: 4.0 Number of attempts: 1 Airway Equipment and Method: bite block Placement Confirmation: positive ETCO2 Tube secured with: Tape Dental Injury: Teeth and Oropharynx as per pre-operative assessment    Procedure Name: Intubation Date/Time: 05/16/2014 9:00 AM Performed by: Mechele Claude Pre-anesthesia Checklist: Patient identified, Emergency Drugs available, Suction available and Patient being monitored Patient Re-evaluated:Patient Re-evaluated prior to inductionOxygen Delivery Method: Circle System Utilized Preoxygenation: Pre-oxygenation with 100% oxygen Intubation Type: IV induction Ventilation: Mask ventilation without difficulty Laryngoscope Size: Mac and 3 Tube type: Oral Tube size: 7.0 mm Number of attempts: 1 Airway Equipment and Method: Stylet Placement Confirmation: ETT inserted through vocal cords under direct vision,  positive ETCO2 and breath sounds checked- equal and bilateral Secured at: 20 cm Tube secured with: Tape Dental Injury: Teeth and Oropharynx as per pre-operative assessment

## 2014-05-16 NOTE — Brief Op Note (Signed)
05/16/2014  9:48 AM  PATIENT:  April [name]  81 y.o. female  PRE-OPERATIVE DIAGNOSIS:  post menopausal bleeding  POST-OPERATIVE DIAGNOSIS:  post menopausal bleeding  PROCEDURE:  Procedure(s): DILATATION & CURETTAGE/HYSTEROSCOPY WITH RESECTOCOPE (N/A) LAPAROSCOPY DIAGNOSTIC (N/A)  SURGEON:  Surgeon(s) and Role:    * Arvella Nigh, MD - Primary  PHYSICIAN ASSISTANT:   ASSISTANTS: none   ANESTHESIA:   none  EBL:  Total I/O In: 1000 [I.V.:1000] Out: -   BLOOD ADMINISTERED:none  DRAINS: none   LOCAL MEDICATIONS USED:  MARCAINE     SPECIMEN:  Source of Specimen:  endometrial curretting  DISPOSITION OF SPECIMEN:  PATHOLOGY  COUNTS:  YES  TOURNIQUET:  * No tourniquets in log *  DICTATION: .Other Dictation: Dictation Number [id number]  PLAN OF CARE: Admit for overnight observation  PATIENT DISPOSITION:  PACU - hemodynamically stable.   Delay start of Pharmacological VTE agent (>24hrs) due to surgical blood loss or risk of bleeding: not applicable

## 2014-05-16 NOTE — Discharge Instructions (Addendum)
Post Anesthesia Home Care Instructions  Activity: Get plenty of rest for the remainder of the day. A responsible adult should stay with you for 24 hours following the procedure.  For the next 24 hours, DO NOT: -Drive a car -Paediatric nurse -Drink alcoholic beverages -Take any medication unless instructed by your physician -Make any legal decisions or sign important papers.  Meals: Start with liquid foods such as gelatin or soup. Progress to regular foods as tolerated. Avoid greasy, spicy, heavy foods. If nausea and/or vomiting occur, drink only clear liquids until the nausea and/or vomiting subsides. Call your physician if vomiting continues.  Special Instructions/Symptoms: Your throat may feel dry or sore from the anesthesia or the breathing tube placed in your throat during surgery. If this causes discomfort, gargle with warm salt water. The discomfort should disappear within 24 hours.  If you had a scopolamine patch placed behind your ear for the management of post- operative nausea and/or vomiting:  1. The medication in the patch is effective for 72 hours, after which it should be removed.  Wrap patch in a tissue and discard in the trash. Wash hands thoroughly with soap and water. 2. You may remove the patch earlier than 72 hours if you experience unpleasant side effects which may include dry mouth, dizziness or visual disturbances. 3. Avoid touching the patch. Wash your hands with soap and water after contact with the patch.   Diagnostic Laparoscopy Laparoscopy is a surgical procedure. It is used to diagnose and treat diseases inside the belly (abdomen). It is usually a brief, common, and relatively simple procedure. The laparoscopeis a thin, lighted, pencil-sized instrument. It is like a telescope. It is inserted into your abdomen through a small cut (incision). Your caregiver can look at the organs inside your body through this instrument. He or she can see if there is anything  abnormal.  RISKS AND COMPLICATIONS  Laparoscopy has relatively few risks. Some problems that can occur include:  Infection.  Bleeding.  Damage to other organs.  Anesthetic side effects. PROCEDURE Once you receive anesthesia, your surgeon inflates the abdomen with a harmless gas (carbon dioxide). This makes the organs easier to see. The laparoscope is inserted into the abdomen through a small incision. This allows your surgeon to see into the abdomen. Other small instruments are also inserted into the abdomen through other small openings. Many surgeons attach a video camera to the laparoscope to enlarge the view. During a diagnostic laparoscopy, the surgeon may be looking for inflammation, infection, or cancer. Your surgeon may take tissue samples(biopsies). The samples are sent to a specialist in looking at cells and tissue samples (pathologist). The pathologist examines them under a microscope. Biopsies can help to diagnose or confirm a disease. AFTER THE PROCEDURE   The gas is released from inside the abdomen.  The incisions are closed with stitches (sutures). Because these incisions are small (usually less than 1/2 inch), there is usually minimal discomfort after the procedure. There may be some mild discomfort in the throat. This is from the tube placed in the throat while you were sleeping. You may have some mild abdominal discomfort. There may also be discomfort from the instrument placement incisions in the abdomen.  The recovery time is shortened as long as there are no complications.  You will rest in a recovery room until stable and doing well. As long as there are no complications, you will  be allowed to go home.    HOME CARE INSTRUCTIONS   Take  all medicines as directed.  Only take over-the-counter or prescription medicines for pain, discomfort, or fever as directed by your caregiver.  Resume daily activities as directed.  Showers are preferred over baths.  You may  resume sexual activities in 1 week or as directed.  Do not drive while taking narcotics. SEEK MEDICAL CARE IF:   There is increasing abdominal pain.  There is new pain in the shoulders (shoulder strap areas).  You feel lightheaded or faint.  You have the chills.  You or your child has an oral temperature above 102 F (38.9 C).  There is pus-like (purulent) drainage from any of the wounds.  You are unable to pass gas or have a bowel movement.  You feel sick to your stomach (nauseous) or throw up (vomit). MAKE SURE YOU:   Understand these instructions.  Will watch your condition.  Will get help right away if you are not doing well or get worse. Document Released: 04/20/2000 Document Revised: 05/09/2012 Document Reviewed: 01/12/2007 [facility] Patient Information 2015 [place], [place]. This information is not intended to replace advice given to you by your health care provider. Make sure you discuss any questions you have with your health care provider.

## 2014-05-16 NOTE — Anesthesia Postprocedure Evaluation (Signed)
  Anesthesia Post-op Note  Patient: April [name]  Procedure(s) Performed: Procedure(s) (LRB): DILATATION & CURETTAGE/HYSTEROSCOPY WITH RESECTOCOPE (N/A) LAPAROSCOPY DIAGNOSTIC (N/A)  Patient Location: PACU  Anesthesia Type: General  Level of Consciousness: awake and alert   Airway and Oxygen Therapy: Patient Spontanous Breathing  Post-op Pain: mild  Post-op Assessment: Post-op Vital signs reviewed, Patient's Cardiovascular Status Stable, Respiratory Function Stable, Patent Airway and No signs of Nausea or vomiting  Last Vitals:  Filed Vitals:   05/16/14 1015  BP: 108/49  Pulse: 60  Temp:   Resp: 17    Post-op Vital Signs: stable   Complications: No apparent anesthesia complications

## 2014-05-16 NOTE — Telephone Encounter (Signed)
Pt requested copy of lab work be mailed to her from 05/04/14.  This was done today.

## 2014-05-16 NOTE — Anesthesia Preprocedure Evaluation (Signed)
Anesthesia Evaluation  Patient identified by MRN, date of birth, ID band Patient awake    Reviewed: Allergy & Precautions, NPO status , Patient's Chart, lab work & pertinent test results  History of Anesthesia Complications (+) history of anesthetic complications (post-op confusion)  Airway Mallampati: II  TM Distance: >3 FB Neck ROM: Full    Dental no notable dental hx.    Pulmonary neg pulmonary ROS,  breath sounds clear to auscultation  Pulmonary exam normal       Cardiovascular negative cardio ROS  Rhythm:Regular Rate:Normal     Neuro/Psych negative neurological ROS  negative psych ROS   GI/Hepatic Neg liver ROS, Colon CA   Endo/Other  negative endocrine ROS  Renal/GU negative Renal ROS  negative genitourinary   Musculoskeletal negative musculoskeletal ROS (+)   Abdominal   Peds negative pediatric ROS (+)  Hematology negative hematology ROS (+)   Anesthesia Other Findings   Reproductive/Obstetrics negative OB ROS                             Anesthesia Physical Anesthesia Plan  ASA: II  Anesthesia Plan: General   Post-op Pain Management:    Induction: Intravenous  Airway Management Planned: LMA  Additional Equipment:   Intra-op Plan:   Post-operative Plan: Extubation in OR  Informed Consent: I have reviewed the patients History and Physical, chart, labs and discussed the procedure including the risks, benefits and alternatives for the proposed anesthesia with the patient or authorized representative who has indicated his/her understanding and acceptance.   Dental advisory given  Plan Discussed with: CRNA  Anesthesia Plan Comments:         Anesthesia Quick Evaluation

## 2014-05-17 ENCOUNTER — Encounter (HOSPITAL_BASED_OUTPATIENT_CLINIC_OR_DEPARTMENT_OTHER): Payer: Self-pay | Admitting: Obstetrics and Gynecology

## 2014-05-23 ENCOUNTER — Non-Acute Institutional Stay: Payer: Medicare Other | Admitting: Nurse Practitioner

## 2014-05-23 ENCOUNTER — Encounter: Payer: Self-pay | Admitting: Nurse Practitioner

## 2014-05-23 VITALS — BP 100/58 | HR 84 | Temp 98.0°F | Resp 20 | Ht 65.0 in | Wt 137.0 lb

## 2014-05-23 DIAGNOSIS — F411 Generalized anxiety disorder: Secondary | ICD-10-CM | POA: Diagnosis not present

## 2014-05-23 DIAGNOSIS — Z853 Personal history of malignant neoplasm of breast: Secondary | ICD-10-CM | POA: Diagnosis not present

## 2014-05-23 DIAGNOSIS — M810 Age-related osteoporosis without current pathological fracture: Secondary | ICD-10-CM

## 2014-05-23 DIAGNOSIS — Z0000 Encounter for general adult medical examination without abnormal findings: Secondary | ICD-10-CM | POA: Diagnosis not present

## 2014-05-23 DIAGNOSIS — R51 Headache: Secondary | ICD-10-CM

## 2014-05-23 DIAGNOSIS — R519 Headache, unspecified: Secondary | ICD-10-CM

## 2014-05-23 MED ORDER — SERTRALINE HCL 50 MG PO TABS: 50.0000 mg | ORAL_TABLET | Freq: Every day | ORAL | Status: DC

## 2014-05-23 NOTE — Progress Notes (Signed)
Passed clock drawing 

## 2014-05-23 NOTE — Progress Notes (Signed)
Patient ID: April [name], female   DOB: 10-19-1925, 81 y.o.   MRN: [id number]    Nursing Home Location:  [facility] (12)  PCP: REED, TIFFANY, DO  Allergies  Allergen Reactions  . Ativan [Lorazepam] Other (See Comments)    confusion  . Codeine Nausea And Vomiting  . Sulfa Antibiotics Other (See Comments)    Unknown childhood reaction    Chief Complaint  Patient presents with  . Annual Exam    Comprehensive exam: anemia, anxiety. Here with daughter [name]  . Headache    for one year, all the time, Tylenol helps    HPI:  Patient is a 81 y.o. female seen today at [facility] for extended visit Pt had procedure to drain uterus, fluid was negative. Complication during procedure which required her to have lap sites, but pt tolerated procedure well. Currently with bruising and mild tenderness around sites.    Bone marrow biopsy came back without any reason for increased WBC  Ongoing follow up with genetic follow up with oncologist  due to breast and colon cancer.   Started Zoloft 25 mg daily at last visit. Feels like it is helping, still having some anxiety. No side effects noted from medication.   Has been having headaches every day for a year. Already been evaluated with MRI, CT, allergist. Nothing has helped.  Did not have the headache in the hospital. Reports them as frontal in between her eyes. denies sinus congestion, no sinus drainage. No changes in vision, no N/V, no ringing in her ears. Tylenol 500 mg helps alleviate the pain.   Screenings: Colon Cancer-01/28/14- hx of colon cancer Breast Cancer-01/26/2014- hx of breast cancer, yearly Osteoporosis- Dexa Scan- 2014, taking calcium and vit d supplements  Vaccines Up to date on: influenza, pneumococcal, Tdap and zostavax    Smoking status: never Alcohol use: occasionally will have glass of wine.   Dentist: every 6 months, up to date Ophthalmologist: up  to day, this month  Exercise regimen: walks a mile every day, tai chi 1 hour session twice weekly Diet: no special diet.  Functional Status of ADLs: Toileting- independent  Bathing-independent Dressing-independent  Review of Systems:  Review of Systems  Constitutional: Negative for activity change, appetite change, fatigue and unexpected weight change.  HENT: Negative for congestion, hearing loss and sore throat.   Eyes: Negative.        Eye exam up to date- macular degeneration   Respiratory: Negative for cough and shortness of breath.   Cardiovascular: Negative for chest pain, palpitations and leg swelling.  Gastrointestinal: Negative for abdominal pain, diarrhea and constipation.  Genitourinary: Negative for dysuria, urgency, frequency and difficulty urinating.  Musculoskeletal: Negative for myalgias and arthralgias.  Skin: Negative for color change.       Itchy skin-- following with Dr Ubaldo Glassing, dermatologist and allergist.   Neurological: Negative for dizziness and weakness.  Psychiatric/Behavioral: Negative for behavioral problems, confusion and agitation. The patient is nervous/anxious.     Past Medical History  Diagnosis Date  . History of breast cancer onocologist--  dr Jana Hakim--  no recurrence    dx 2011-- DCIS, Right breast,  ER/PR positive --- s/p lumpectomy  No radiation, took Aromasen 2012  . Senile osteoporosis     Took Fosamax x15years  . GERD (gastroesophageal reflux disease)   . Sigmoid diverticulosis   . Wears hearing aid     bilateral  . PMB (postmenopausal bleeding)   . Colon  cancer oncologist--  dr Truitt Merle    dx jan 2016  --  Ascending colon carcinoma,  Stage IIIB,  pT3 N1a M0,  Grade 2,  MSI,  1 of 15 nodes +//   s/p  right colectomy  . Hypereosinophilic syndrome   . Chronic diarrhea    Past Surgical History  Procedure Laterality Date  . Cataract extraction w/ intraocular lens  implant, bilateral Bilateral [id number]  . Tonsillectomy and adenoidectomy   1930  . Colonoscopy N/A 01/28/2014    Procedure: COLONOSCOPY;  Surgeon: Missy Sabins, MD;  Location: [facility];  Service: Endoscopy;  Laterality: N/A;  . Esophagogastroduodenoscopy N/A 01/28/2014    Procedure: ESOPHAGOGASTRODUODENOSCOPY (EGD);  Surgeon: Missy Sabins, MD;  Location: [facility];  Service: Endoscopy;  Laterality: N/A;  . Laparoscopic partial right colectomy Right 01/30/2014    Procedure: LAPAROSCOPIC PARTIAL RIGHT COLECTOMY;  Surgeon: Pedro Earls, MD;  Location: [facility];  Service: General;  Laterality: Right;  . Laparotomy N/A 01/31/2014    Procedure: EXPLORATORY LAPAROTOMY, RESECTION OF PRIOR SMALL BOWEL ANASTOMOSIS, UPPER ENDOSCOPY, CREATED A  NEW ILEO COLOSTOMY;  Surgeon: Pedro Earls, MD;  Location: [facility];  Service: General;  Laterality: N/A;  . Laparoscopic cholecystectomy  05-26-2009  . Breast lumpectomy Right 12-24-2009  . Transthoracic echocardiogram  04-18-2014    mild LVH/  ef 38-17%/  grade I diastolic dysfunction/  mild LAE/  trivial MR and TR  . Dilatation & currettage/hysteroscopy with resectocope N/A 05/16/2014    Procedure: DILATATION & CURETTAGE/HYSTEROSCOPY WITH RESECTOCOPE;  Surgeon: Arvella Nigh, MD;  Location: [facility];  Service: Gynecology;  Laterality: N/A;  . Laparoscopy N/A 05/16/2014    Procedure: LAPAROSCOPY DIAGNOSTIC;  Surgeon: Arvella Nigh, MD;  Location[facility];  Service: Gynecology;  Laterality: N/A;   Social History:   reports that she has never smoked. She has never used smokeless tobacco. She reports that she drinks about 1.8 oz of alcohol per week. She reports that she does not use illicit drugs.  Family History  Problem Relation Age of Onset  . Heart disease Mother   . Heart disease Father   . Diabetes Father   . Breast cancer Paternal Aunt     2 paternal aunts dx over 67s  . Throat cancer Maternal Grandmother   . Melanoma Daughter 14  . Breast cancer Other     niece, dx less than 30  . Breast  cancer Cousin     paternal cousin dx over 2  . Liver disease Sister   . Diabetes Brother     Medications: Patient's Medications  New Prescriptions   No medications on file  Previous Medications   ACETAMINOPHEN (TYLENOL) 500 MG TABLET    Take 500 mg by mouth every 6 (six) hours as needed for pain (pain).   CALCIUM CARBONATE-VITAMIN D 600-400 MG-UNIT PER TABLET    Take 1 tablet by mouth daily.   CHOLECALCIFEROL (VITAMIN D) 1000 UNITS TABLET    Take 4,000 Units by mouth at bedtime.    COLESTIPOL (MICRONIZED COLESTIPOL HCL) 1 G TABLET    Take 1 g by mouth daily.    FEXOFENADINE (ALLEGRA) 30 MG TABLET    Take 30 mg by mouth every morning.    MULTIPLE VITAMINS-MINERALS (CENTRUM SILVER ADULT 50+ PO)    Take 1 tablet by mouth daily.   OVER THE COUNTER MEDICATION    Take 1 tablet by mouth daily. Macular supplement   PROBIOTIC PRODUCT (ALIGN PO)  Take 1 tablet by mouth daily.   RANITIDINE (ZANTAC) 150 MG CAPSULE    Take 1 capsule by mouth every morning.    SERTRALINE (ZOLOFT) 25 MG TABLET    Take 1 tablet (25 mg total) by mouth daily.  Modified Medications   No medications on file  Discontinued Medications   HYDROMORPHONE (DILAUDID) 2 MG TABLET    Take 1 tablet (2 mg total) by mouth every 4 (four) hours as needed for severe pain.     Physical Exam: Filed Vitals:   05/23/14 1439  Height: 5' 5"  (1.651 m)  Weight: 137 lb (62.143 kg)    Physical Exam  Constitutional: She is oriented to person, place, and time. She appears well-developed and well-nourished.  HENT:  Head: Normocephalic and atraumatic.  Right Ear: External ear normal.  Left Ear: External ear normal.  Nose: Nose normal.  Mouth/Throat: Oropharynx is clear and moist. No oropharyngeal exudate.  Eyes: Conjunctivae and EOM are normal. Pupils are equal, round, and reactive to light.  Neck: Normal range of motion. Neck supple. No JVD present.  Cardiovascular: Normal rate, regular rhythm, normal heart sounds and intact distal  pulses.   No murmur heard. Pulmonary/Chest: Effort normal and breath sounds normal. She has no wheezes. Right breast exhibits no mass and no skin change. Left breast exhibits no mass and no skin change.  Abdominal: Soft. Bowel sounds are normal. There is no tenderness.  Has 4 cm vertical scar at [employer] from surgery in January. Lap sites from recent procedure with ecchymosis  noted.   Musculoskeletal: Normal range of motion.  Neurological: She is alert and oriented to person, place, and time. She has normal reflexes. No cranial nerve deficit.  Skin: Skin is warm and dry.  Psychiatric: She has a normal mood and affect.    Labs reviewed: Basic Metabolic Panel:  Recent Labs  01/25/14 0643  02/07/14 0900 02/08/14 0515 02/09/14 0526 03/02/14 1036  NA 140  < > 136 140 137 143  K 4.1  < > 3.3* 3.8 3.6 3.9  CL 112  < > 105 105 105  --   CO2 23  < > 25 26 26 24   GLUCOSE 117*  < > 104* 88 91 101  BUN 13  < > 10 9 9  9.5  CREATININE 0.63  < > 0.60 0.68 0.66 0.8  CALCIUM 7.8*  < > 8.1* 8.5 8.3* 9.0  MG 1.8  --  1.8 2.2  --   --   PHOS 3.2  --   --   --   --   --   < > = values in this interval not displayed. Liver Function Tests:  Recent Labs  02/07/14 0900 02/08/14 0515 03/02/14 1036  AST 12 14 17   ALT 13 12 14   ALKPHOS 53 56 79  BILITOT 1.2 0.8 0.56  PROT 5.8* 5.5* 6.7  ALBUMIN 3.2* 3.0* 3.6   No results for input(s): LIPASE, AMYLASE in the last 8760 hours. No results for input(s): AMMONIA in the last 8760 hours. CBC:  Recent Labs  04/16/14 0730 05/04/14 1313 05/16/14 0730  WBC 24.7* 20.8* 23.3*  NEUTROABS 13.1* 11.3* 12.1*  HGB 13.0 12.7 12.7  HCT 40.7 38.6 40.5  MCV 80.8 79.6 80.2  PLT 163 139* 151   TSH:  Recent Labs  01/25/14 0643  TSH 4.055   A1C: No results found for: HGBA1C Lipid Panel: No results for input(s): CHOL, HDL, LDLCALC, TRIG, CHOLHDL, LDLDIRECT in the last 8760 hours.  Assessment/Plan  1. Anxiety state -improved but still having  anxiety, will increase zoloft to 50 mg daily  - sertraline (ZOLOFT) 50 MG tablet; Take 1 tablet (50 mg total) by mouth daily.  Dispense: 30 tablet; Refill: 4  2. Headache, unspecified headache type -tylenol helping headache, does not appear to be migraine or tension, possible cluster headache since there was relief in hospital when she was on O2 however pain is bilateral or centralized vs one side -discussed options like seeing specialist and she would rather not see another provider at this time -tylenol 500 mg daily is effective, encouraged her to add to AM medication and may use again if needed  -can also refer to specialist in the future if needed  3. hx: breast cancer, DCIS, right, receptor + -ongoing oncology followup with genetic testing  4. Senile osteoporosis -conts calcium and vit d  5. Preventative health care  despite recent hx and procedures pt is doing very well. Cont to exercise daily and has a very active and healthy lifestyle   The patient was counseled regarding the appropriate use of alcohol, regular self-examination of the breasts on a monthly basis, prevention of dental and periodontal disease, diet, regular sustained exercise for at least 30 minutes 5 times per week, conts routine screening interval for mammogram as recommended by [facility] [place] and ACOG due to hx of breast CA,  and recommended schedule for GI hemoccult testing, colonoscopy, cholesterol, thyroid and diabetes screening.

## 2014-05-24 ENCOUNTER — Telehealth: Payer: Self-pay | Admitting: Genetic Counselor

## 2014-05-24 NOTE — Telephone Encounter (Signed)
Returned phone call from Ms. Macauley's daughter about follow up testing on her genetic test results.

## 2014-05-31 ENCOUNTER — Other Ambulatory Visit: Payer: Self-pay | Admitting: *Deleted

## 2014-05-31 ENCOUNTER — Telehealth: Payer: Self-pay | Admitting: Hematology

## 2014-05-31 ENCOUNTER — Telehealth: Payer: Self-pay | Admitting: *Deleted

## 2014-05-31 NOTE — Telephone Encounter (Signed)
S/w pt confirming MD visit per 05/05 POF for 05/06 apt..... KJ

## 2014-05-31 NOTE — Telephone Encounter (Signed)
Received call from pt stating that she is starting to itch again like before & wants to know if Dr Burr Medico needs to see her.  She reports that it isn't bad yet but on a scale of 0-10 she would rate # 2.  Message to Dr Burr Medico.

## 2014-06-01 ENCOUNTER — Ambulatory Visit (HOSPITAL_BASED_OUTPATIENT_CLINIC_OR_DEPARTMENT_OTHER): Payer: Medicare Other | Admitting: Hematology

## 2014-06-01 ENCOUNTER — Encounter: Payer: Self-pay | Admitting: Hematology

## 2014-06-01 VITALS — BP 128/54 | HR 92 | Temp 97.9°F | Resp 18 | Ht 65.0 in | Wt 133.4 lb

## 2014-06-01 DIAGNOSIS — D72119 Hypereosinophilic syndrome (hes), unspecified: Secondary | ICD-10-CM

## 2014-06-01 DIAGNOSIS — Z853 Personal history of malignant neoplasm of breast: Secondary | ICD-10-CM | POA: Diagnosis not present

## 2014-06-01 DIAGNOSIS — D721 Eosinophilia: Secondary | ICD-10-CM | POA: Diagnosis not present

## 2014-06-01 DIAGNOSIS — C189 Malignant neoplasm of colon, unspecified: Secondary | ICD-10-CM

## 2014-06-01 MED ORDER — ALPRAZOLAM 0.25 MG PO TABS: 0.2500 mg | ORAL_TABLET | Freq: Two times a day (BID) | ORAL | Status: DC | PRN

## 2014-06-01 MED ORDER — PREDNISONE 5 MG PO TABS: ORAL_TABLET | ORAL | Status: DC

## 2014-06-01 NOTE — Progress Notes (Signed)
OFFICE PROGRESS NOTE  CC: Dr. Neldon Mc Dr. Carmela Rima, [facility], DO [address] [place] [address] OTHER MDS: Clayton Lefort  DIAGNOSIS: stage III colon cancer, hypereosinophilic syndrome  Colon cancer   Staging form: Colon and Rectum, AJCC 7th Edition     Clinical stage from 01/30/2014: Stage IIIB (T3, N1a, M0) - Unsigned     Colon cancer   01/28/2014 Tumor Marker CEA  14.6. Tumor MMR deficient, MSI high   01/30/2014 Initial Diagnosis Colon cancer   01/30/2014 Surgery right colon segmental resection, with clear margins.   01/30/2014 Pathologic Stage [id number], stage IIIB, 1 out of 15 lymph nodes was positive.   OTHER ISSUES: 1. 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 patient was then begun on Aromasin 25 mg daily however she was discontinued 9 days later due to the fact that she developed  grade 3 arthralgias and myalgias. 2. Leukocytosis since July 2015, with significant neutrophilia and eosinophilia,likely hypereosinophilic syndrome  3. History of thrombocytopenia when she was young  CURRENT THERAPY: observation  HISTORY OF INITIAL PRESENTATION: Mrs. Altland is a 81 year old Caucasian female, with past medical history of breast DCIS, previously under my colleague Dr. Virgie Dad care, and was transferred to me due to her recent diagnosed colon cancer.  She presented with diarrhea for 6 month and acute rectal bleeding and was admitted to hospital on 01/27/2014. She was transfused PRBCs. CT scan suggested bleeding from angiodysplasia. She was admitted for further management. She she underwent colonoscopy and EGD. Colonic mass was noted along with some abnormal duodenal findings. Biopsy of the colon mass showed adenocarcinoma. The patient underwent laproscopic R hemicolectomy on 1/5. A large bleed developed post-operatively and the patient was first transferred to the ICU and later underwent ex-lap with resection of  small bowel anastamosis and new ileocolostomy on 1/6. She was discharged to a rehab for a week and got home about one weeks ago.   She has home PT also now. She is able to walk one mile daily and all does all her ADLs. She has not gone outside yet.  She denies any pain, no nausea, her appetite has been low since surgery, she eats cheese, soup, and small regular meals, she lost 15lbs since the surgery. She has headaches for the past few month ago, and had brain MRI in the hospital which was negative.  She still has intermittent loose BM, 1-2 daily.   She was also noticed to have significant leukocytosis since July 2015, with dominantly elevated neutrophil and eosinophil. She had some cytopenia when she was young, and developed some cyst pia around her colon surgery, now resolved.  INTERIM HISTORY: [facility] called yesterday and requested an urgent visit. She developed recurrent skin rash on the upper chest, which are itching, similar to the rash she had before. She has not had skin rash on the arms or other places, and is less severe than last time. She is concerned it may get worse. She otherwise has no other new complaints   MEDICAL HISTORY: Past Medical History  Diagnosis Date  . History of breast cancer onocologist--  dr Jana Hakim--  no recurrence    dx 2011-- DCIS, Right breast,  ER/PR positive --- s/p lumpectomy  No radiation, took Aromasen 2012  . Senile osteoporosis     Took Fosamax x15years  . GERD (gastroesophageal reflux disease)   . Sigmoid diverticulosis   . Wears hearing aid  bilateral  . PMB (postmenopausal bleeding)   . Colon cancer oncologist--  dr Truitt Merle    dx jan 2016  --  Ascending colon carcinoma,  Stage IIIB,  pT3 N1a M0,  Grade 2,  MSI,  1 of 15 nodes +//   s/p  right colectomy  . Hypereosinophilic syndrome   . Chronic diarrhea     ALLERGIES:  is allergic to ativan; codeine; and sulfa antibiotics.  MEDICATIONS:  Current Outpatient Prescriptions  Medication Sig  Dispense Refill  . acetaminophen (TYLENOL) 500 MG tablet Take 500 mg by mouth every 6 (six) hours as needed. Taking twice daily for headaches     . Calcium Carbonate-Vitamin D 600-400 MG-UNIT per tablet Take 1 tablet by mouth daily.    . cholecalciferol (VITAMIN D) 1000 UNITS tablet Take 4,000 Units by mouth at bedtime.     . colestipol (MICRONIZED COLESTIPOL HCL) 1 G tablet Take 1 g by mouth daily.     . fexofenadine (ALLEGRA) 30 MG tablet Take 30 mg by mouth every morning.     . Multiple Vitamins-Minerals (CENTRUM SILVER ADULT 50+ PO) Take 1 tablet by mouth daily.    [name] OVER THE COUNTER MEDICATION Take 1 tablet by mouth daily. Macular supplement    . Probiotic Product (ALIGN PO) Take 1 tablet by mouth daily.    . ranitidine (ZANTAC) 150 MG capsule Take 1 capsule by mouth every morning.   0  . sertraline (ZOLOFT) 50 MG tablet Take 1 tablet (50 mg total) by mouth daily. 30 tablet 4   No current facility-administered medications for this visit.    SURGICAL HISTORY:  Past Surgical History  Procedure Laterality Date  . Cataract extraction w/ intraocular lens  implant, bilateral Bilateral [id number]  . Tonsillectomy and adenoidectomy  1930  . Colonoscopy N/A 01/28/2014    Procedure: COLONOSCOPY;  Surgeon: Missy Sabins, MD;  Location: [facility];  Service: Endoscopy;  Laterality: N/A;  . Esophagogastroduodenoscopy N/A 01/28/2014    Procedure: ESOPHAGOGASTRODUODENOSCOPY (EGD);  Surgeon: Missy Sabins, MD;  Location: [facility];  Service: Endoscopy;  Laterality: N/A;  . Laparoscopic partial right colectomy Right 01/30/2014    Procedure: LAPAROSCOPIC PARTIAL RIGHT COLECTOMY;  Surgeon: Pedro Earls, MD;  Location: [facility];  Service: General;  Laterality: Right;  . Laparotomy N/A 01/31/2014    Procedure: EXPLORATORY LAPAROTOMY, RESECTION OF PRIOR SMALL BOWEL ANASTOMOSIS, UPPER ENDOSCOPY, CREATED A  NEW ILEO COLOSTOMY;  Surgeon: Pedro Earls, MD;  Location: [facility];  Service: General;  Laterality:  N/A;  . Laparoscopic cholecystectomy  05-26-2009  . Breast lumpectomy Right 12-24-2009  . Transthoracic echocardiogram  04-18-2014    mild LVH/  ef 21-30%/  grade I diastolic dysfunction/  mild LAE/  trivial MR and TR  . Dilatation & currettage/hysteroscopy with resectocope N/A 05/16/2014    Procedure: DILATATION & CURETTAGE/HYSTEROSCOPY WITH RESECTOCOPE;  Surgeon: Arvella Nigh, MD;  Location: [facility];  Service: Gynecology;  Laterality: N/A;  . Laparoscopy N/A 05/16/2014    Procedure: LAPAROSCOPY DIAGNOSTIC;  Surgeon: Arvella Nigh, MD;  Location[facility];  Service: Gynecology;  Laterality: N/A;   ROS  Constitutional: Denies fevers, chills or abnormal night sweats Eyes: Denies blurriness of vision, double vision or watery eyes Ears, nose, mouth, throat, and face: Denies mucositis or sore throat Respiratory: Denies cough, dyspnea or wheezes Cardiovascular: Denies palpitation, chest discomfort or lower extremity swelling Gastrointestinal: Denies nausea, heartburn or change in bowel habits Skin: see HPI  Lymphatics: Denies new  lymphadenopathy or easy bruising Neurological:Denies numbness, tingling or new weaknesses Behavioral/Psych: Mood is stable, no new changes  All other systems were reviewed with the patient and are negative.  PHYSICAL EXAMINATION: Filed Vitals:   06/01/14 1549  BP: 128/54  Pulse: 92  Temp: 97.9 F (36.6 C)  Resp: 18   Body mass index is 22.2 kg/(m^2).  ECOG PERFORMANCE STATUS: 2  Sclerae unicteric, pupils equal and reactive Oropharynx clear and moist-- no thrush or other lesions No cervical or supraclavicular adenopathy Lungs no rales or rhonchi Heart regular rate and rhythm Abd soft, surgical scars well healed, nontender, positive bowel sounds, no masses palpated MSK no focal spinal tenderness, no upper extremity lymphedema Neuro: nonfocal, well oriented, appropriate affect Breasts: The right breast is status post  lumpectomy. There is no evidence of local recurrence. The right axilla is benign. The left breast is unremarkable. Skin: scatter macular skin rash on the upper front chest, no rashes on the arms, legs or back.  LABORATORY DATA: Outside labs reviewed CBC Latest Ref Rng 05/16/2014 05/04/2014 04/16/2014  WBC 4.0 - 10.5 K/uL 23.3(H) 20.8(H) 24.7(H)  Hemoglobin 12.0 - 15.0 g/dL 12.7 12.7 13.0  Hematocrit 36.0 - 46.0 % 40.5 38.6 40.7  Platelets 150 - 400 K/uL 151 139(L) 163    CMP Latest Ref Rng 03/02/2014 02/09/2014 02/08/2014  Glucose 70 - 140 mg/dl 101 91 88  BUN 7.0 - 26.0 mg/dL 9.5 9 9   Creatinine 0.6 - 1.1 mg/dL 0.8 0.66 0.68  Sodium 136 - 145 mEq/L 143 137 140  Potassium 3.5 - 5.1 mEq/L 3.9 3.6 3.8  Chloride 96 - 112 mEq/L - 105 105  CO2 22 - 29 mEq/L 24 26 26   Calcium 8.4 - 10.4 mg/dL 9.0 8.3(L) 8.5  Total Protein 6.4 - 8.3 g/dL 6.7 - 5.5(L)  Total Bilirubin 0.20 - 1.20 mg/dL 0.56 - 0.8  Alkaline Phos 40 - 150 U/L 79 - 56  AST 5 - 34 U/L 17 - 14  ALT 0 - 55 U/L 14 - 12   CEA  Status: Finalresult Visible to patient:  Not Released Nextappt: 04/09/2014 at 02:00 PM in Oncology Florene Glen, Emi [place], [place]) Dx:  Colon cancer           Ref Range 3wk ago  71moago     CEA 0.0 - 5.0 ng/mL 2.2 14.6 (H)CM   Resulting Agency  [facility]          Pathology reports: Colon, segmental resection for tumor, partial right 01/30/2014  1 of 3 FINAL for [place], Hartlee A (([id number] Diagnosis(continued) INVASIVE ADENOCARCINOMA, INVADING THROUGH THE MUSCULARIS PROPRIA INTO PERICOLONIC FATTY TISSUE. ONE OF FIFTEEN LYMPH NODES, POSITIVE FOR METASTATIC ADENOCARCINOMA (1/15). RESECTION MARGINS, NEGATIVE FOR TUMOR. APPENDIX: BENIGN APPENDICEAL TISSUE, NO EVIDENCE OF ACTIVE INFLAMMATION OR NEOPLASM. Specimen: Right colon. Procedure: Segmental resection Tumor site: Ascending colon Specimen integrity: Intact Macroscopic intactness of mesorectum: Not  applicable. Macroscopic tumor perforation: N/A Invasive tumor: Maximum size: 5.0 cm Histologic type(s): Invasive adenocarcinoma with extracellular mucin Histologic grade and differentiation: G2: moderately differentiated/low grade Type of polyp in which invasive carcinoma arose: N/A Microscopic extension of invasive tumor: invading through muscularis propria into pericolonic fatty tissue. Lymph-Vascular invasion: Not identified Peri-neural invasion: No Tumor deposit(s) (discontinuous extramural extension): No Resection margins: Negative Proximal margin: 10 cm Distal margin: 9 cm Circumferential (radial) (posterior ascending, posterior descending; lateral and posterior mid-rectum; and entire lower 1/3 rectum): 3 cm Treatment effect (neo-adjuvant therapy): No Additional polyp(s): N/A Non-neoplastic findings: N/A Lymph nodes:  number examined 1; number positive: 15 Pathologic Staging: p T3, N1a, Mx Ancillary studies: MMR stains will be RADIOGRAPHIC STUDIES:  COMPARISON: Multiple priors  ACR Breast Density Category b: There are scattered areas of  fibroglandular density.  FINDINGS:  The patient has had a right lumpectomy. No new mass or suspicious  calcifications are seen in either breast. Compared to priors.  Mammographic images were processed with CAD.  IMPRESSION:  No mammographic evidence of local recurrence, right breast. No  mammographic evidence of malignancy, left breast.  RECOMMENDATION:  Diagnostic mammography in 1 year.  I have discussed the findings and recommendations with the patient.  Results were also provided in writing at the conclusion of the  visit. If applicable, a reminder letter will be sent to the patient  regarding the next appointment.  BI-RADS CATEGORY 1: Negative  Electronically Signed  By: Duke Salvia M.D.  On: 12/02/2012 11:47   ADDITIONAL INFORMATION: Mismatch Repair (MMR) Protein Immunohistochemistry (IHC) IHC Expression Result: MLH1:  LOSS OF NUCLEAR EXPRESSION (LESS THAN 5% TUMOR EXPRESSION) MSH2: Preserved nuclear expression (greater 50% tumor expression) MSH6: Preserved nuclear expression (greater 50% tumor expression) PMS2: LOSS OF NUCLEAR EXPRESSION (LESS THAN 5% TUMOR EXPRESSION) * Internal control demonstrates intact nuclear expression Interpretation: ABNORMAL There is loss of the major and minor MMR proteins MLH1 and PMS2. The loss of expression may be secondary to promoter hyper-methylation, gene mutation or other genetic event. BRAF mutation testing and/or MLH1 methylation testing is indicated. The presence of a BRAF mutation and/or MLH1 hyper-methylation is indicative of a sporadic-type tumor. The absence of either BRAF mutation and/or presence of normal-methylation indicate the possible presence of a hereditary germline mutation (e.g. Lynch syndrome) and referral to genetic counseling is warranted. It is recommended that the loss of protein expression be correlated with molecular based MSI testing.   wil  Bone Marrow, Aspirate,Biopsy, and Clot, right iliac 04/16/2014 - HYPERCELLULAR BONE MARROW FOR AGE WITH GRANULOCYTIC PROLIFERATION INCLUDING NEUTROPHILIA AND EOSINOPHILIA. - MEGAKARYOCYTIC PROLIFERATION. - SEE COMMENT. PERIPHERAL BLOOD: - LEUKOCYTOSIS WITH NEUTROPHILIA, EOSINOPHILIA AND BASOPHILIA. Diagnosis Note It is unclear whether the morphologic features represent a secondary or a primary clonal process particularly a myeloproliferative myeloproliferative/myelodysplastic process with eosinophilia. Previous FISH studies performed on peripheral blood including PDGFRA, PGDFRB, FGFR1, BCR-ABL, and CBFB are all negative. Selected FISH studies will be repeated on bone marrow material and the results reported under separate cover. There is no increase in blastic cells or evidence of metastatic carcinoma in this material.    ASSESSMENT: 81 y.o. [place] woman  1. Hypereosinophilia syndrome (HES)  -Her  WBC was normal until June 2015. Her total WBC has been in the range of 20-30 K, with predominant neutrophils and eosinophils. Her basophil has been slightly elevated also. Her absolute eosinophils count is in the range 6-8K/ul -Her bone marrow was hypocellular, with granular cystic proliferation including neutrophilia and eosinophilia. The eosinophil count in bone marrow was 14%  -BCR/ABL and JAK2 gene mutation( -). -Both blood and bone marrow FISH testing for PDGFRA, PGDFRB, FGFR1, BCR-ABL, and CBFB are all negative. -Her skin rash biopsy showed perivascular eosinophil infiltrates, which is supported for hypereosinophil syndrome -Giving the significant elevated eosinophil counts and skin involvement, she meets the criteria for hypereosinophilia syndrome. However reactive eosinophilia is not ruled out. -Her chest CT and echocardiogram were unremarkable.  -HES patients with skin urticaria, has less cardiac and neuro manifestations, and has a better prognosis. -Giving the negative PDGFR, she would not benefit from [place]. -She responded to steroids very  well in the past but had quite severe psychological side effects. She now has mild skin rash, likely flare of the hypereosinophilia syndrome, I will give her a prescription of low-dose prednisone (20 mg for 2 days, 10 mg for 3 days, then 5 mg for 5 days ) today.   2.  Right colon cancer, pT3N1aM0, stage IIIB, G2, MSI high -I reviewed her surgical past findings with her and her husband, daughter in great details.  -I reviewed her staging CT of chest, which is negative for metastasis. -Given her locally advanced stage and elevated preoperative CEA, she has high risks of cancer recurrence, approximately 50%, especially in the first few years. -I discussed that the standard care is adjuvant chemotherapy with FOLFOX for stage III colon cancer. However given her advanced age, limited performance status after surgery, I do not think she is a good candidate  for adjuvant chemotherapy. Her tumor has high microsatellite instability, which is a good prognostic marker, also predicts a poor response to single agent 5-FU.  -After lengthy discussion, we agreed to observe her closely after surgery. I discussed the surveillance plan, with lab and physical exam every 3-4 months, repeat colonoscopy and CT scan in one year. -We also discussed the treatment option if she has cancer recurrence in the future, which includes surgery, systemic therapy especially immunotherapy for metastatic disease. Clinical data has shown to accelerate response (70%) with PD1 antibody to colon cancer with MSI high.   3. GENETICS -She underwent genetic test, which was negative for Lynch syndrome. 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 -She denied further somatic mutation test (such as punch skin biopsy)  3. History of right breast DCIS, ER/PR positive. -The patient previously couldn't tolerate aromatase inhibitor.  -No evidence of disease recurrence on exam. -Continue observation  -Continue yearly mammogram  Follow-up:  -Monthly CBC, return to clinic in July as scheduled.  -She will call me next week about her response to low-dose steroids   I spent 30 minutes, more than 50% time face to face counseling for her visit.  Truitt Merle, MD

## 2014-06-08 ENCOUNTER — Other Ambulatory Visit: Payer: BLUE CROSS/BLUE SHIELD

## 2014-06-14 ENCOUNTER — Telehealth: Payer: Self-pay | Admitting: *Deleted

## 2014-06-14 NOTE — Telephone Encounter (Signed)
Spoke with pt and was given update by pt about completion of Prednisone:  1.  Pt completed last dose of Prednisone on Monday 06/11/14. 2.  Pt did not completely stop itching - level 1/10.  Itching occurs mostly at night and early AM at breast, chest, and stomach. Pt did not have itching during day time.  Pt stated she did not have broken out places this time. Pt aware of next lab appts.  Dr. Burr Medico made aware. Pt's  Phone   [phone].

## 2014-07-06 ENCOUNTER — Other Ambulatory Visit (HOSPITAL_BASED_OUTPATIENT_CLINIC_OR_DEPARTMENT_OTHER): Payer: Medicare Other

## 2014-07-06 ENCOUNTER — Other Ambulatory Visit: Payer: BLUE CROSS/BLUE SHIELD

## 2014-07-06 ENCOUNTER — Encounter (INDEPENDENT_AMBULATORY_CARE_PROVIDER_SITE_OTHER): Payer: Self-pay

## 2014-07-06 DIAGNOSIS — D72119 Hypereosinophilic syndrome (hes), unspecified: Secondary | ICD-10-CM

## 2014-07-06 DIAGNOSIS — C189 Malignant neoplasm of colon, unspecified: Secondary | ICD-10-CM | POA: Diagnosis present

## 2014-07-06 DIAGNOSIS — D721 Eosinophilia: Secondary | ICD-10-CM | POA: Diagnosis not present

## 2014-07-06 LAB — CBC WITH DIFFERENTIAL/PLATELET
BASO%: 0.8 % (ref 0.0–2.0)
Basophils Absolute: 0.3 10*3/uL — ABNORMAL HIGH (ref 0.0–0.1)
EOS ABS: 5.7 10*3/uL — AB (ref 0.0–0.5)
EOS%: 17.1 % — ABNORMAL HIGH (ref 0.0–7.0)
HCT: 37.5 % (ref 34.8–46.6)
HGB: 12.4 g/dL (ref 11.6–15.9)
LYMPH%: 10.2 % — ABNORMAL LOW (ref 14.0–49.7)
MCH: 26.2 pg (ref 25.1–34.0)
MCHC: 33.1 g/dL (ref 31.5–36.0)
MCV: 79.1 fL — AB (ref 79.5–101.0)
MONO#: 0.7 10*3/uL (ref 0.1–0.9)
MONO%: 2.1 % (ref 0.0–14.0)
NEUT%: 69.8 % (ref 38.4–76.8)
NEUTROS ABS: 23.2 10*3/uL — AB (ref 1.5–6.5)
NRBC: 0 % (ref 0–0)
Platelets: 142 10*3/uL — ABNORMAL LOW (ref 145–400)
RBC: 4.74 10*6/uL (ref 3.70–5.45)
RDW: 18.4 % — ABNORMAL HIGH (ref 11.2–14.5)
WBC: 33.2 10*3/uL — ABNORMAL HIGH (ref 3.9–10.3)
lymph#: 3.4 10*3/uL — ABNORMAL HIGH (ref 0.9–3.3)

## 2014-07-06 LAB — TECHNOLOGIST REVIEW

## 2014-07-07 LAB — CEA: CEA: 2.6 ng/mL (ref 0.0–5.0)

## 2014-08-10 ENCOUNTER — Telehealth: Payer: Self-pay | Admitting: Hematology

## 2014-08-10 ENCOUNTER — Encounter: Payer: Self-pay | Admitting: Hematology

## 2014-08-10 ENCOUNTER — Other Ambulatory Visit (HOSPITAL_BASED_OUTPATIENT_CLINIC_OR_DEPARTMENT_OTHER): Payer: Medicare Other

## 2014-08-10 ENCOUNTER — Ambulatory Visit (HOSPITAL_BASED_OUTPATIENT_CLINIC_OR_DEPARTMENT_OTHER): Payer: Medicare Other | Admitting: Hematology

## 2014-08-10 VITALS — BP 120/52 | HR 79 | Temp 98.1°F | Resp 18 | Ht 65.0 in | Wt 131.5 lb

## 2014-08-10 DIAGNOSIS — C189 Malignant neoplasm of colon, unspecified: Secondary | ICD-10-CM

## 2014-08-10 DIAGNOSIS — D721 Eosinophilia: Secondary | ICD-10-CM

## 2014-08-10 DIAGNOSIS — Z853 Personal history of malignant neoplasm of breast: Secondary | ICD-10-CM

## 2014-08-10 DIAGNOSIS — D72119 Hypereosinophilic syndrome (hes), unspecified: Secondary | ICD-10-CM

## 2014-08-10 LAB — CBC WITH DIFFERENTIAL/PLATELET
BASO%: 0.7 % (ref 0.0–2.0)
Basophils Absolute: 0.3 10*3/uL — ABNORMAL HIGH (ref 0.0–0.1)
EOS ABS: 5.2 10*3/uL — AB (ref 0.0–0.5)
EOS%: 12.4 % — ABNORMAL HIGH (ref 0.0–7.0)
HCT: 36.4 % (ref 34.8–46.6)
HEMOGLOBIN: 11.8 g/dL (ref 11.6–15.9)
LYMPH%: 9.9 % — ABNORMAL LOW (ref 14.0–49.7)
MCH: 25.1 pg (ref 25.1–34.0)
MCHC: 32.4 g/dL (ref 31.5–36.0)
MCV: 77.5 fL — ABNORMAL LOW (ref 79.5–101.0)
MONO#: 1 10*3/uL — ABNORMAL HIGH (ref 0.1–0.9)
MONO%: 2.4 % (ref 0.0–14.0)
NEUT%: 74.6 % (ref 38.4–76.8)
NEUTROS ABS: 31.1 10*3/uL — AB (ref 1.5–6.5)
PLATELETS: 126 10*3/uL — AB (ref 145–400)
RBC: 4.69 10*6/uL (ref 3.70–5.45)
RDW: 18 % — AB (ref 11.2–14.5)
WBC: 41.7 10*3/uL — ABNORMAL HIGH (ref 3.9–10.3)
lymph#: 4.1 10*3/uL — ABNORMAL HIGH (ref 0.9–3.3)

## 2014-08-10 LAB — TECHNOLOGIST REVIEW

## 2014-08-10 NOTE — Progress Notes (Signed)
[facility] OFFICE PROGRESS NOTE  CC: Dr. Neldon Mc Dr. Carmela Rima, [facility], DO [address] [place] [address] OTHER MDS: Clayton Lefort  DIAGNOSIS: stage III colon cancer, hypereosinophilic syndrome  Colon cancer   Staging form: Colon and Rectum, AJCC 7th Edition     Clinical stage from 01/30/2014: Stage IIIB (T3, N1a, M0) - Unsigned     Colon cancer   01/28/2014 Tumor Marker CEA  14.6. Tumor MMR deficient, MSI high   01/30/2014 Initial Diagnosis Colon cancer   01/30/2014 Surgery right colon segmental resection, with clear margins.   01/30/2014 Pathologic Stage PT3N1aM0, stage IIIB, 1 out of 15 lymph nodes was positive.   OTHER ISSUES: 1. 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 patient was then begun on Aromasin 25 mg daily however she was discontinued 9 days later due to the fact that she developed  grade 3 arthralgias and myalgias. 2. Leukocytosis since July 2015, with significant neutrophilia and eosinophilia,likely hypereosinophilic syndrome  3. History of thrombocytopenia when she was young  CURRENT THERAPY: observation  HISTORY OF INITIAL PRESENTATION: April [name] is a 81 year old Caucasian female, with past medical history of breast DCIS, previously under my colleague Dr. Virgie Dad care, and was transferred to me due to her recent diagnosed colon cancer.  She presented with diarrhea for 6 month and acute rectal bleeding and was admitted to hospital on 01/27/2014. She was transfused PRBCs. CT scan suggested bleeding from angiodysplasia. She was admitted for further management. She she underwent colonoscopy and EGD. Colonic mass was noted along with some abnormal duodenal findings. Biopsy of the colon mass showed adenocarcinoma. The patient underwent laproscopic R hemicolectomy on 1/5. A large bleed developed post-operatively and the patient was first transferred to the ICU and later underwent  ex-lap with resection of small bowel anastamosis and new ileocolostomy on 1/6. She was discharged to a rehab for a week and got home about one weeks ago.   She has home PT also now. She is able to walk one mile daily and all does all her ADLs. She has not gone outside yet.  She denies any pain, no nausea, her appetite has been low since surgery, she eats cheese, soup, and small regular meals, she lost 15lbs since the surgery. She has headaches for the past few month ago, and had brain MRI in the hospital which was negative.  She still has intermittent loose BM, 1-2 daily.   She was also noticed to have significant leukocytosis since July 2015, with dominantly elevated neutrophil and eosinophil. She had some cytopenia when she was young, and developed some cyst pia around her colon surgery, now resolved.  INTERIM HISTORY: April [name] returns for follow-up. She presents to the clinic with her daughter and her husband. She probably reported her back muscle and developed low-back pain, which is positional. She was seen by her primary care physician and was given tramadol for days ago. Her pain has improved. No significant leg weakness. She has not had skin rash breakout since I saw HER-2 months ago. No fever or chills, no night sweats, no weight loss. No other new complaints.   MEDICAL HISTORY: Past Medical History  Diagnosis Date  . History of breast cancer onocologist--  dr Jana Hakim--  no recurrence    dx 2011-- DCIS, Right breast,  ER/PR positive --- s/p lumpectomy  No radiation, took Aromasen 2012  . Senile osteoporosis     Took Fosamax  x15years  . GERD (gastroesophageal reflux disease)   . Sigmoid diverticulosis   . Wears hearing aid     bilateral  . PMB (postmenopausal bleeding)   . Colon cancer oncologist--  dr Truitt Merle    dx jan 2016  --  Ascending colon carcinoma,  Stage IIIB,  pT3 N1a M0,  Grade 2,  MSI,  1 of 15 nodes +//   s/p  right colectomy  . Hypereosinophilic syndrome   . Chronic  diarrhea     ALLERGIES:  is allergic to ativan; codeine; and sulfa antibiotics.  MEDICATIONS:  Current Outpatient Prescriptions  Medication Sig Dispense Refill  . acetaminophen (TYLENOL) 500 MG tablet Take 500 mg by mouth every 6 (six) hours as needed. Taking twice daily for headaches     . ALPRAZolam (XANAX) 0.25 MG tablet Take 1 tablet (0.25 mg total) by mouth 2 (two) times daily as needed for anxiety or sleep. 10 tablet 0  . Calcium Carbonate-Vitamin D 600-400 MG-UNIT per tablet Take 1 tablet by mouth daily.    . cholecalciferol (VITAMIN D) 1000 UNITS tablet Take 4,000 Units by mouth at bedtime.     . colestipol (MICRONIZED COLESTIPOL HCL) 1 G tablet Take 1 g by mouth daily.     . Fe Fum-Fe Poly-Vit C-Lactobac (FUSION PO) Take 1 tablet by mouth daily.    . fexofenadine (ALLEGRA) 30 MG tablet Take 30 mg by mouth every morning.     . meloxicam (MOBIC) 7.5 MG tablet Take 1 tablet by mouth 2 (two) times daily.  0  . Multiple Vitamins-Minerals (CENTRUM SILVER ADULT 50+ PO) Take 1 tablet by mouth daily.    [name] OVER THE COUNTER MEDICATION Take 1 tablet by mouth at bedtime. Macular supplement    . Probiotic Product (ALIGN PO) Take 1 tablet by mouth daily.    . ranitidine (ZANTAC) 150 MG capsule Take 1 capsule by mouth every morning.   0  . sertraline (ZOLOFT) 50 MG tablet Take 1 tablet (50 mg total) by mouth daily. 30 tablet 4  . traMADol (ULTRAM) 50 MG tablet Take 1 tablet by mouth 2 (two) times daily.  0   No current facility-administered medications for this visit.    SURGICAL HISTORY:  Past Surgical History  Procedure Laterality Date  . Cataract extraction w/ intraocular lens  implant, bilateral Bilateral [id number]  . Tonsillectomy and adenoidectomy  1930  . Colonoscopy N/A 01/28/2014    Procedure: COLONOSCOPY;  Surgeon: Missy Sabins, MD;  Location: [facility];  Service: Endoscopy;  Laterality: N/A;  . Esophagogastroduodenoscopy N/A 01/28/2014    Procedure: ESOPHAGOGASTRODUODENOSCOPY  (EGD);  Surgeon: Missy Sabins, MD;  Location: [facility];  Service: Endoscopy;  Laterality: N/A;  . Laparoscopic partial right colectomy Right 01/30/2014    Procedure: LAPAROSCOPIC PARTIAL RIGHT COLECTOMY;  Surgeon: Pedro Earls, MD;  Location: [facility];  Service: General;  Laterality: Right;  . Laparotomy N/A 01/31/2014    Procedure: EXPLORATORY LAPAROTOMY, RESECTION OF PRIOR SMALL BOWEL ANASTOMOSIS, UPPER ENDOSCOPY, CREATED A  NEW ILEO COLOSTOMY;  Surgeon: Pedro Earls, MD;  Location: [facility];  Service: General;  Laterality: N/A;  . Laparoscopic cholecystectomy  05-26-2009  . Breast lumpectomy Right 12-24-2009  . Transthoracic echocardiogram  04-18-2014    mild LVH/  ef 54-56%/  grade I diastolic dysfunction/  mild LAE/  trivial MR and TR  . Dilatation & currettage/hysteroscopy with resectocope N/A 05/16/2014    Procedure: DILATATION & CURETTAGE/HYSTEROSCOPY WITH RESECTOCOPE;  Surgeon: Arvella Nigh, MD;  Location: [facility];  Service: Gynecology;  Laterality: N/A;  . Laparoscopy N/A 05/16/2014    Procedure: LAPAROSCOPY DIAGNOSTIC;  Surgeon: Arvella Nigh, MD;  Location: [facility];  Service: Gynecology;  Laterality: N/A;   ROS  Constitutional: Denies fevers, chills or abnormal night sweats Eyes: Denies blurriness of vision, double vision or watery eyes Ears, nose, mouth, throat, and face: Denies mucositis or sore throat Respiratory: Denies cough, dyspnea or wheezes Cardiovascular: Denies palpitation, chest discomfort or lower extremity swelling Gastrointestinal: Denies nausea, heartburn or change in bowel habits Skin: see HPI  Lymphatics: Denies new lymphadenopathy or easy bruising Neurological:Denies numbness, tingling or new weaknesses Behavioral/Psych: Mood is stable, no new changes  All other systems were reviewed with the patient and are negative.  PHYSICAL EXAMINATION: Filed Vitals:   08/10/14 1456  BP: 120/52  Pulse: 79  Temp: 98.1 F (36.7  C)  Resp: 18   Body mass index is 21.88 kg/(m^2).  ECOG PERFORMANCE STATUS: 2  Sclerae unicteric, pupils equal and reactive Oropharynx clear and moist-- no thrush or other lesions No cervical or supraclavicular adenopathy Lungs no rales or rhonchi Heart regular rate and rhythm Abd soft, surgical scars well healed, nontender, positive bowel sounds, no masses palpated MSK no focal spinal tenderness, no upper extremity lymphedema Neuro: nonfocal, well oriented, appropriate affect Breasts: The right breast is status post lumpectomy. There is no evidence of local recurrence. The right axilla is benign. The left breast is unremarkable. Skin: scatter macular skin rash on the upper front chest, no rashes on the arms, legs or back.  LABORATORY DATA: Outside labs reviewed CBC Latest Ref Rng 08/10/2014 07/06/2014 05/16/2014  WBC 3.9 - 10.3 10e3/uL 41.7(H) 33.2(H) 23.3(H)  Hemoglobin 11.6 - 15.9 g/dL 11.8 12.4 12.7  Hematocrit 34.8 - 46.6 % 36.4 37.5 40.5  Platelets 145 - 400 10e3/uL 126(L) 142(L) 151    CMP Latest Ref Rng 03/02/2014 02/09/2014 02/08/2014  Glucose 70 - 140 mg/dl 101 91 88  BUN 7.0 - 26.0 mg/dL 9.5 9 9   Creatinine 0.6 - 1.1 mg/dL 0.8 0.66 0.68  Sodium 136 - 145 mEq/L 143 137 140  Potassium 3.5 - 5.1 mEq/L 3.9 3.6 3.8  Chloride 96 - 112 mEq/L - 105 105  CO2 22 - 29 mEq/L 24 26 26   Calcium 8.4 - 10.4 mg/dL 9.0 8.3(L) 8.5  Total Protein 6.4 - 8.3 g/dL 6.7 - 5.5(L)  Total Bilirubin 0.20 - 1.20 mg/dL 0.56 - 0.8  Alkaline Phos 40 - 150 U/L 79 - 56  AST 5 - 34 U/L 17 - 14  ALT 0 - 55 U/L 14 - 12   CEA (Order [id number])      CEA  Status: Finalresult Visible to patient:  Not Released Nextappt: 11/21/2014 at 01:30 PM in [place] (REED, TIFFANY, DO) Dx:  Colon cancer           Ref Range 25moago  53mogo  80m85moo     CEA 0.0 - 5.0 ng/mL 2.6 2.2 14.6 (H)CM          Pathology reports: Colon, segmental resection for tumor, partial right  01/30/2014  1 of 3 FINAL for Bosler, Jalayiah A (SZ([id number](continued) INVASIVE ADENOCARCINOMA, INVADING THROUGH THE MUSCULARIS PROPRIA INTO PERICOLONIC FATTY TISSUE. ONE OF FIFTEEN LYMPH NODES, POSITIVE FOR METASTATIC ADENOCARCINOMA (1/15). RESECTION MARGINS, NEGATIVE FOR TUMOR. APPENDIX: BENIGN APPENDICEAL TISSUE, NO EVIDENCE OF ACTIVE INFLAMMATION OR NEOPLASM. Specimen: Right colon. Procedure: Segmental resection Tumor site: Ascending colon Specimen integrity: Intact  Macroscopic intactness of mesorectum: Not applicable. Macroscopic tumor perforation: N/A Invasive tumor: Maximum size: 5.0 cm Histologic type(s): Invasive adenocarcinoma with extracellular mucin Histologic grade and differentiation: G2: moderately differentiated/low grade Type of polyp in which invasive carcinoma arose: N/A Microscopic extension of invasive tumor: invading through muscularis propria into pericolonic fatty tissue. Lymph-Vascular invasion: Not identified Peri-neural invasion: No Tumor deposit(s) (discontinuous extramural extension): No Resection margins: Negative Proximal margin: 10 cm Distal margin: 9 cm Circumferential (radial) (posterior ascending, posterior descending; lateral and posterior mid-rectum; and entire lower 1/3 rectum): 3 cm Treatment effect (neo-adjuvant therapy): No Additional polyp(s): N/A Non-neoplastic findings: N/A Lymph nodes: number examined 1; number positive: 15 Pathologic Staging: p T3, N1a, Mx Ancillary studies: MMR stains will be RADIOGRAPHIC STUDIES:  COMPARISON: Multiple priors  ACR Breast Density Category b: There are scattered areas of  fibroglandular density.  FINDINGS:  The patient has had a right lumpectomy. No new mass or suspicious  calcifications are seen in either breast. Compared to priors.  Mammographic images were processed with CAD.  IMPRESSION:  No mammographic evidence of local recurrence, right breast. No  mammographic evidence of  malignancy, left breast.  RECOMMENDATION:  Diagnostic mammography in 1 year.  I have discussed the findings and recommendations with the patient.  Results were also provided in writing at the conclusion of the  visit. If applicable, a reminder letter will be sent to the patient  regarding the next appointment.  BI-RADS CATEGORY 1: Negative  Electronically Signed  By: Duke Salvia M.D.  On: 12/02/2012 11:47   ADDITIONAL INFORMATION: Mismatch Repair (MMR) Protein Immunohistochemistry (IHC) IHC Expression Result: MLH1: LOSS OF NUCLEAR EXPRESSION (LESS THAN 5% TUMOR EXPRESSION) MSH2: Preserved nuclear expression (greater 50% tumor expression) MSH6: Preserved nuclear expression (greater 50% tumor expression) PMS2: LOSS OF NUCLEAR EXPRESSION (LESS THAN 5% TUMOR EXPRESSION) * Internal control demonstrates intact nuclear expression Interpretation: ABNORMAL There is loss of the major and minor MMR proteins MLH1 and PMS2. The loss of expression may be secondary to promoter hyper-methylation, gene mutation or other genetic event. BRAF mutation testing and/or MLH1 methylation testing is indicated. The presence of a BRAF mutation and/or MLH1 hyper-methylation is indicative of a sporadic-type tumor. The absence of either BRAF mutation and/or presence of normal-methylation indicate the possible presence of a hereditary germline mutation (e.g. Lynch syndrome) and referral to genetic counseling is warranted. It is recommended that the loss of protein expression be correlated with molecular based MSI testing.   wil  Bone Marrow, Aspirate,Biopsy, and Clot, right iliac 04/16/2014 - HYPERCELLULAR BONE MARROW FOR AGE WITH GRANULOCYTIC PROLIFERATION INCLUDING NEUTROPHILIA AND EOSINOPHILIA. - MEGAKARYOCYTIC PROLIFERATION. - SEE COMMENT. PERIPHERAL BLOOD: - LEUKOCYTOSIS WITH NEUTROPHILIA, EOSINOPHILIA AND BASOPHILIA. Diagnosis Note It is unclear whether the morphologic features represent a secondary  or a primary clonal process particularly a myeloproliferative myeloproliferative/myelodysplastic process with eosinophilia. Previous FISH studies performed on peripheral blood including PDGFRA, PGDFRB, FGFR1, BCR-ABL, and CBFB are all negative. Selected FISH studies will be repeated on bone marrow material and the results reported under separate cover. There is no increase in blastic cells or evidence of metastatic carcinoma in this material.    ASSESSMENT: 81 y.o. April [name] woman  1. Hypereosinophilia syndrome (HES)  -Her WBC was normal until June 2015. Her total WBC has been in the range of 20-30 K, with predominant neutrophils and eosinophils. Her basophil has been slightly elevated also. Her absolute eosinophils count is in the range 6-8K/ul -Her bone marrow was hypocellular, with granular cystic proliferation including neutrophilia and eosinophilia. The  eosinophil count in bone marrow was 14%  -BCR/ABL and JAK2 gene mutation( -). -Both blood and bone marrow FISH testing for PDGFRA, PGDFRB, FGFR1, BCR-ABL, and CBFB are all negative. -Her skin rash biopsy showed perivascular eosinophil infiltrates, which is supported for hypereosinophil syndrome -Giving the significant elevated eosinophil counts and skin involvement, she meets the criteria for hypereosinophilia syndrome. However reactive eosinophilia is not ruled out. -Her chest CT and echocardiogram were unremarkable.  -HES patients with skin urticaria, has less cardiac and neuro manifestations, and has a better prognosis. -Giving the negative PDGFR, she would not benefit from [place]. -She responded to steroids very well in the past but had quite severe psychological side effects. She now has mild skin rash, likely flare of the hypereosinophilia syndrome, I gave her a prescription of low-dose prednisone (20 mg for 2 days, 10 mg for 3 days, then 5 mg for 5 days ) for second episode of skin rash and she responded well also. -Her white count  has been trending up significantly in the past few months, 41.7K today, also absolute eosinophil count is stable. -I recommend repeat course of steroids if her white count goes above 50 K. We'll repeat a CBC months -Mild thrombocytopenia, she states she had history of ITP, did not require treatment. We'll continue monitoring   2.  Right colon cancer, pT3N1aM0, stage IIIB, G2, MSI high -I reviewed her surgical past findings with her and her husband, daughter in great details.  -I reviewed her staging CT of chest, which is negative for metastasis. -Given her locally advanced stage and elevated preoperative CEA, she has high risks of cancer recurrence, approximately 50%, especially in the first few years. -I discussed that the standard care is adjuvant chemotherapy with FOLFOX for stage III colon cancer. However given her advanced age, limited performance status after surgery, I do not think she is a good candidate for adjuvant chemotherapy. Her tumor has high microsatellite instability, which is a good prognostic marker, also predicts a poor response to single agent 5-FU.  -After lengthy discussion, we agreed to observe her closely after surgery. I discussed the surveillance plan, with lab and physical exam every 3-4 months, repeat colonoscopy and CT scan in one year. -We also discussed the treatment option if she has cancer recurrence in the future, which includes surgery, systemic therapy especially immunotherapy for metastatic disease. Clinical data has shown to accelerate response (70%) with PD1 antibody to colon cancer with MSI high.  -Her CEA has been normal, we'll continue surveillance -Colonoscopy and CT scan in December 2016  3. GENETICS -She underwent genetic test, which was negative for Lynch syndrome. 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 -She denied further somatic mutation test  (such as punch skin biopsy)  3. History of right breast DCIS, ER/PR positive. -The patient previously couldn't tolerate aromatase inhibitor.  -No evidence of disease recurrence on exam. -Continue observation  -Continue yearly mammogram  Follow-up:  -Monthly CBC, return to clinic in 2 month.   I spent 30 minutes, more than 50% time face to face counseling for her visit.  Truitt Merle, MD

## 2014-08-10 NOTE — Telephone Encounter (Signed)
Pt confirmed labs/ov per 07/15 POF, gave pt AVS and Calendar..... KJ °

## 2014-09-04 ENCOUNTER — Encounter: Payer: Self-pay | Admitting: *Deleted

## 2014-09-10 ENCOUNTER — Other Ambulatory Visit: Payer: Self-pay | Admitting: *Deleted

## 2014-09-10 ENCOUNTER — Other Ambulatory Visit (HOSPITAL_BASED_OUTPATIENT_CLINIC_OR_DEPARTMENT_OTHER): Payer: Medicare Other

## 2014-09-10 ENCOUNTER — Telehealth: Payer: Self-pay | Admitting: *Deleted

## 2014-09-10 DIAGNOSIS — D721 Eosinophilia: Secondary | ICD-10-CM | POA: Diagnosis present

## 2014-09-10 DIAGNOSIS — D72119 Hypereosinophilic syndrome (hes), unspecified: Secondary | ICD-10-CM

## 2014-09-10 DIAGNOSIS — C189 Malignant neoplasm of colon, unspecified: Secondary | ICD-10-CM

## 2014-09-10 LAB — CBC WITH DIFFERENTIAL/PLATELET
BASO%: 0.9 % (ref 0.0–2.0)
Basophils Absolute: 0.5 10*3/uL — ABNORMAL HIGH (ref 0.0–0.1)
EOS%: 8.7 % — ABNORMAL HIGH (ref 0.0–7.0)
Eosinophils Absolute: 4.6 10*3/uL — ABNORMAL HIGH (ref 0.0–0.5)
HCT: 38.1 % (ref 34.8–46.6)
HEMOGLOBIN: 12.5 g/dL (ref 11.6–15.9)
LYMPH#: 4.4 10*3/uL — AB (ref 0.9–3.3)
LYMPH%: 8.2 % — ABNORMAL LOW (ref 14.0–49.7)
MCH: 26.3 pg (ref 25.1–34.0)
MCHC: 32.8 g/dL (ref 31.5–36.0)
MCV: 80.2 fL (ref 79.5–101.0)
MONO#: 1.4 10*3/uL — AB (ref 0.1–0.9)
MONO%: 2.6 % (ref 0.0–14.0)
NEUT%: 79.6 % — ABNORMAL HIGH (ref 38.4–76.8)
NEUTROS ABS: 42.4 10*3/uL — AB (ref 1.5–6.5)
NRBC: 0 % (ref 0–0)
Platelets: 129 10*3/uL — ABNORMAL LOW (ref 145–400)
RBC: 4.75 10*6/uL (ref 3.70–5.45)
RDW: 18.5 % — ABNORMAL HIGH (ref 11.2–14.5)
WBC: 53.3 10*3/uL — AB (ref 3.9–10.3)

## 2014-09-10 MED ORDER — PREDNISONE 5 MG PO TABS: 5.0000 mg | ORAL_TABLET | ORAL | Status: DC

## 2014-09-10 NOTE — Telephone Encounter (Signed)
Dr. Burr Medico reviewed lab results today.  Spoke with daughter [name].  Gave Betsy instructions for pt to take :   Prednisone  5 mg -  Take 4 tablets = 20 mg  Daily  For  2  Days.                                  Take 2 tablets = 10 mg  Daily  For  3  Days.                                  Take 1 talbet   =  5 mg   Daily  For  5  Days  Betsy also wanted to know if pt could resume Meloxicam  While taking  Prednisone.  Instructed Betsy to have pt hold on this med until nurse could clarify with Dr. Burr Medico.  Betsy voiced understanding. Betsy's  Phone    [phone].

## 2014-09-10 NOTE — Telephone Encounter (Signed)
Informed April [name] that per Dr. Burr Medico, pt can keep scheduled appts for lab and office visit on 10/10/14, and no need to be seen sooner.

## 2014-09-10 NOTE — Telephone Encounter (Signed)
Spoke with daughter [name] again, and informed her re: per Dr. Burr Medico, wait until pt completes the course of Prednisone before resuming Meloxicam.  However, if pt has to take Meloxicam, pt needs to monitor for gastric discomfort.  Betsy voiced understanding.

## 2014-10-08 ENCOUNTER — Other Ambulatory Visit: Payer: Medicare Other

## 2014-10-10 ENCOUNTER — Other Ambulatory Visit (HOSPITAL_BASED_OUTPATIENT_CLINIC_OR_DEPARTMENT_OTHER): Payer: Medicare Other

## 2014-10-10 ENCOUNTER — Ambulatory Visit (HOSPITAL_BASED_OUTPATIENT_CLINIC_OR_DEPARTMENT_OTHER): Payer: Medicare Other | Admitting: Hematology

## 2014-10-10 ENCOUNTER — Encounter: Payer: Self-pay | Admitting: Hematology

## 2014-10-10 ENCOUNTER — Telehealth: Payer: Self-pay | Admitting: Hematology

## 2014-10-10 ENCOUNTER — Ambulatory Visit (HOSPITAL_BASED_OUTPATIENT_CLINIC_OR_DEPARTMENT_OTHER): Payer: Medicare Other

## 2014-10-10 VITALS — BP 116/90 | HR 95 | Temp 98.2°F | Resp 18 | Ht 65.0 in | Wt 125.6 lb

## 2014-10-10 DIAGNOSIS — D72119 Hypereosinophilic syndrome (hes), unspecified: Secondary | ICD-10-CM

## 2014-10-10 DIAGNOSIS — C189 Malignant neoplasm of colon, unspecified: Secondary | ICD-10-CM

## 2014-10-10 DIAGNOSIS — D471 Chronic myeloproliferative disease: Secondary | ICD-10-CM

## 2014-10-10 DIAGNOSIS — D721 Eosinophilia: Secondary | ICD-10-CM

## 2014-10-10 DIAGNOSIS — Z23 Encounter for immunization: Secondary | ICD-10-CM

## 2014-10-10 DIAGNOSIS — Z853 Personal history of malignant neoplasm of breast: Secondary | ICD-10-CM

## 2014-10-10 LAB — CBC WITH DIFFERENTIAL/PLATELET
BASO%: 0.1 % (ref 0.0–2.0)
Basophils Absolute: 0.1 10*3/uL (ref 0.0–0.1)
EOS ABS: 3.2 10*3/uL — AB (ref 0.0–0.5)
EOS%: 3.6 % (ref 0.0–7.0)
HEMATOCRIT: 36.4 % (ref 34.8–46.6)
HGB: 11.3 g/dL — ABNORMAL LOW (ref 11.6–15.9)
LYMPH%: 6.4 % — AB (ref 14.0–49.7)
MCH: 25.1 pg (ref 25.1–34.0)
MCHC: 30.9 g/dL — AB (ref 31.5–36.0)
MCV: 81.2 fL (ref 79.5–101.0)
MONO#: 1.8 10*3/uL — ABNORMAL HIGH (ref 0.1–0.9)
MONO%: 2 % (ref 0.0–14.0)
NEUT#: 80.1 10*3/uL — ABNORMAL HIGH (ref 1.5–6.5)
NEUT%: 87.9 % — AB (ref 38.4–76.8)
PLATELETS: 130 10*3/uL — AB (ref 145–400)
RBC: 4.48 10*6/uL (ref 3.70–5.45)
RDW: 18.2 % — ABNORMAL HIGH (ref 11.2–14.5)
WBC: 91 10*3/uL (ref 3.9–10.3)
lymph#: 5.8 10*3/uL — ABNORMAL HIGH (ref 0.9–3.3)
nRBC: 0 % (ref 0–0)

## 2014-10-10 LAB — TECHNOLOGIST REVIEW: Technologist Review: 1

## 2014-10-10 LAB — CEA: CEA: 2 ng/mL (ref 0.0–5.0)

## 2014-10-10 MED ORDER — HYDROXYUREA 500 MG PO CAPS: 500.0000 mg | ORAL_CAPSULE | Freq: Every day | ORAL | Status: DC

## 2014-10-10 MED ORDER — INFLUENZA VAC SPLIT QUAD 0.5 ML IM SUSY
0.5000 mL | PREFILLED_SYRINGE | Freq: Once | INTRAMUSCULAR | Status: AC
  Administered 2014-10-10: 0.5 mL via INTRAMUSCULAR
  Filled 2014-10-10: qty 0.5

## 2014-10-10 NOTE — Telephone Encounter (Signed)
per pof to sch pt appt-gave pt copy of avs °

## 2014-10-10 NOTE — Progress Notes (Signed)
[place] OFFICE PROGRESS NOTE  CC: Dr. Neldon Mc Dr. Carmela Rima, [facility], DO [address] [place] [address] OTHER MDS: Clayton Lefort  DIAGNOSIS: stage III colon cancer, hypereosinophilic syndrome  Colon cancer   Staging form: Colon and Rectum, AJCC 7th Edition     Clinical stage from 01/30/2014: Stage IIIB (T3, N1a, M0) - Unsigned     Colon cancer   01/28/2014 Tumor Marker CEA  14.6. Tumor MMR deficient, MSI high   01/30/2014 Initial Diagnosis Colon cancer   01/30/2014 Surgery right colon segmental resection, with clear margins.   01/30/2014 Pathologic Stage PT3N1aM0, stage IIIB, 1 out of 15 lymph nodes was positive.   OTHER ISSUES: 1. 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 patient was then begun on Aromasin 25 mg daily however she was discontinued 9 days later due to the fact that she developed  grade 3 arthralgias and myalgias. 2. Leukocytosis since July 2015, with significant neutrophilia and eosinophilia,likely hypereosinophilic syndrome  3. History of thrombocytopenia when she was young  CURRENT THERAPY: observation  HISTORY OF INITIAL PRESENTATION: Mrs. Myre is a 81 year old Caucasian female, with past medical history of breast DCIS, previously under my colleague Dr. Virgie Dad care, and was transferred to me due to her recent diagnosed colon cancer.  She presented with diarrhea for 6 month and acute rectal bleeding and was admitted to hospital on 01/27/2014. She was transfused PRBCs. CT scan suggested bleeding from angiodysplasia. She was admitted for further management. She she underwent colonoscopy and EGD. Colonic mass was noted along with some abnormal duodenal findings. Biopsy of the colon mass showed adenocarcinoma. The patient underwent laproscopic R hemicolectomy on 1/5. A large bleed developed post-operatively and the patient was first transferred to the ICU and later underwent  ex-lap with resection of small bowel anastamosis and new ileocolostomy on 1/6. She was discharged to a rehab for a week and got home about one weeks ago.   She has home PT also now. She is able to walk one mile daily and all does all her ADLs. She has not gone outside yet.  She denies any pain, no nausea, her appetite has been low since surgery, she eats cheese, soup, and small regular meals, she lost 15lbs since the surgery. She has headaches for the past few month ago, and had brain MRI in the hospital which was negative.  She still has intermittent loose BM, 1-2 daily.   She was also noticed to have significant leukocytosis since July 2015, with dominantly elevated neutrophil and eosinophil. She had some cytopenia when she was young, and developed some cyst pia around her colon surgery, now resolved.  INTERIM HISTORY: Raeleen returns for follow-up. She presents to the clinic with her daughter and her husband. She has been having moderate persistent headaches for the past 2 weeks, no vision change or other new symptoms. She also reports slightly worse fatigue, and some dyspnea on exertion. No fever or chills, no night sweats, no bleeding, or other symptoms. 4 weeks ago, her lab CBC showed WBC 53K, so I give her her low-dose tapering course prednisone. She tolerated well, did not notice any significant change clinically.  MEDICAL HISTORY: Past Medical History  Diagnosis Date  . History of breast cancer onocologist--  dr Jana Hakim--  no recurrence    dx 2011-- DCIS, Right breast,  ER/PR positive --- s/p lumpectomy  No radiation, took Aromasen 2012  . Senile osteoporosis  Took Fosamax x15years  . GERD (gastroesophageal reflux disease)   . Sigmoid diverticulosis   . Wears hearing aid     bilateral  . PMB (postmenopausal bleeding)   . Colon cancer oncologist--  dr Truitt Merle    dx jan 2016  --  Ascending colon carcinoma,  Stage IIIB,  pT3 N1a M0,  Grade 2,  MSI,  1 of 15 nodes +//   s/p  right  colectomy  . Hypereosinophilic syndrome   . Chronic diarrhea     ALLERGIES:  is allergic to ativan; codeine; and sulfa antibiotics.  MEDICATIONS:  Current Outpatient Prescriptions  Medication Sig Dispense Refill  . acetaminophen (TYLENOL) 500 MG tablet Take 500 mg by mouth every 6 (six) hours as needed. Taking twice daily for headaches     . ALPRAZolam (XANAX) 0.25 MG tablet Take 1 tablet (0.25 mg total) by mouth 2 (two) times daily as needed for anxiety or sleep. 10 tablet 0  . Calcium Carbonate-Vitamin D 600-400 MG-UNIT per tablet Take 1 tablet by mouth daily.    . cholecalciferol (VITAMIN D) 1000 UNITS tablet Take 4,000 Units by mouth at bedtime.     . colestipol (MICRONIZED COLESTIPOL HCL) 1 G tablet Take 1 g by mouth daily.     . Fe Fum-Fe Poly-Vit C-Lactobac (FUSION PO) Take 1 tablet by mouth daily.    . fexofenadine (ALLEGRA) 30 MG tablet Take 30 mg by mouth every morning.     . meloxicam (MOBIC) 7.5 MG tablet Take 1 tablet by mouth 2 (two) times daily.  0  . Multiple Vitamins-Minerals (CENTRUM SILVER ADULT 50+ PO) Take 1 tablet by mouth daily.    [name] OVER THE COUNTER MEDICATION Take 1 tablet by mouth at bedtime. Macular supplement    . predniSONE (DELTASONE) 5 MG tablet Take 1 tablet (5 mg total) by mouth as directed. Take Prednisone  5 mg (  4 tablets = 20 mg ) daily for 2 days.                                        (  2 tablets = 10 mg ) daily for 3 days.                                        (  1 tablet =  5 mg ) daily for 5 days. 20 tablet 0  . Probiotic Product (ALIGN PO) Take 1 tablet by mouth daily.    . ranitidine (ZANTAC) 150 MG capsule Take 1 capsule by mouth every morning.   0  . sertraline (ZOLOFT) 50 MG tablet Take 1 tablet (50 mg total) by mouth daily. 30 tablet 4  . traMADol (ULTRAM) 50 MG tablet Take 1 tablet by mouth 2 (two) times daily.  0   No current facility-administered medications for this visit.    SURGICAL HISTORY:  Past Surgical History  Procedure  Laterality Date  . Cataract extraction w/ intraocular lens  implant, bilateral Bilateral [id number]  . Tonsillectomy and adenoidectomy  1930  . Colonoscopy N/A 01/28/2014    Procedure: COLONOSCOPY;  Surgeon: Missy Sabins, MD;  Location: [facility];  Service: Endoscopy;  Laterality: N/A;  . Esophagogastroduodenoscopy N/A 01/28/2014    Procedure: ESOPHAGOGASTRODUODENOSCOPY (EGD);  Surgeon: Missy Sabins, MD;  Location: [facility];  Service: Endoscopy;  Laterality: N/A;  . Laparoscopic partial right colectomy Right 01/30/2014    Procedure: LAPAROSCOPIC PARTIAL RIGHT COLECTOMY;  Surgeon: Pedro Earls, MD;  Location: [facility];  Service: General;  Laterality: Right;  . Laparotomy N/A 01/31/2014    Procedure: EXPLORATORY LAPAROTOMY, RESECTION OF PRIOR SMALL BOWEL ANASTOMOSIS, UPPER ENDOSCOPY, CREATED A  NEW ILEO COLOSTOMY;  Surgeon: Pedro Earls, MD;  Location: [facility];  Service: General;  Laterality: N/A;  . Laparoscopic cholecystectomy  05-26-2009  . Breast lumpectomy Right 12-24-2009  . Transthoracic echocardiogram  04-18-2014    mild LVH/  ef 16-10%/  grade I diastolic dysfunction/  mild LAE/  trivial MR and TR  . Dilatation & currettage/hysteroscopy with resectocope N/A 05/16/2014    Procedure: DILATATION & CURETTAGE/HYSTEROSCOPY WITH RESECTOCOPE;  Surgeon: Arvella Nigh, MD;  Location: [place];  Service: Gynecology;  Laterality: N/A;  . Laparoscopy N/A 05/16/2014    Procedure: LAPAROSCOPY DIAGNOSTIC;  Surgeon: Arvella Nigh, MD;  Location: [facility];  Service: Gynecology;  Laterality: N/A;   ROS  Constitutional: Denies fevers, chills or abnormal night sweats Eyes: Denies blurriness of vision, double vision or watery eyes Ears, nose, mouth, throat, and face: Denies mucositis or sore throat Respiratory: Denies cough, dyspnea or wheezes Cardiovascular: Denies palpitation, chest discomfort or lower extremity swelling Gastrointestinal: Denies nausea, heartburn or  change in bowel habits Skin: see HPI  Lymphatics: Denies new lymphadenopathy or easy bruising Neurological:Denies numbness, tingling or new weaknesses Behavioral/Psych: Mood is stable, no new changes  All other systems were reviewed with the patient and are negative.  PHYSICAL EXAMINATION: Filed Vitals:   10/10/14 0956  BP: 116/90  Pulse: 95  Temp: 98.2 F (36.8 C)  Resp: 18   Body mass index is 20.9 kg/(m^2).  ECOG PERFORMANCE STATUS: 2  Sclerae unicteric, pupils equal and reactive Oropharynx clear and moist-- no thrush or other lesions No cervical or supraclavicular adenopathy Lungs no rales or rhonchi Heart regular rate and rhythm Abd soft, surgical scars well healed, nontender, positive bowel sounds, no masses palpated MSK no focal spinal tenderness, no upper extremity lymphedema Neuro: nonfocal, well oriented, appropriate affect Breasts: The right breast is status post lumpectomy. There is no evidence of local recurrence. The right axilla is benign. The left breast is unremarkable. Skin: scatter macular skin rash on the upper front chest, no rashes on the arms, legs or back.  LABORATORY DATA: Outside labs reviewed CBC Latest Ref Rng 10/10/2014 09/10/2014 08/10/2014  WBC 3.9 - 10.3 10e3/uL 91.0(HH) 53.3(HH) 41.7(H)  Hemoglobin 11.6 - 15.9 g/dL 11.3(L) 12.5 11.8  Hematocrit 34.8 - 46.6 % 36.4 38.1 36.4  Platelets 145 - 400 10e3/uL 130(L) 129(L) 126(L)    CMP Latest Ref Rng 03/02/2014 02/09/2014 02/08/2014  Glucose 70 - 140 mg/dl 101 91 88  BUN 7.0 - 26.0 mg/dL 9._0 Creatinine 0.6 - 1.1 mg/dL 0.8 0.66 0.68  Sodium 136 - 145 mEq/L 143 137 140  Potassium 3.5 - 5.1 mEq/L 3.9 3.6 3.8  Chloride 96 - 112 mEq/L - 105 105  CO2 22 - 29 mEq/L _1 Calcium 8.4 - 10.4 mg/dL 9.0 8.3(L) 8.5  Total Protein 6.4 - 8.3 g/dL 6.7 - 5.5(L)  Total Bilirubin 0.20 - 1.20 mg/dL 0.56 - 0.8  Alkaline Phos 40 - 150 U/L 79 - 56  AST 5 - 34 U/L 17 - 14  ALT 0 - 55 U/L 14 - 12   CEA  (Order [id number])  CEA  Status: Finalresult Visible to patient:  Not Released Nextappt: 11/21/2014 at 01:30 PM in [place] (REED, TIFFANY, DO) Dx:  Colon cancer           Ref Range 24moago  565mogo  [id number]     CEA 0.0 - 5.0 ng/mL 2.6 2.2 14.6 (H)CM          Pathology reports: Colon, segmental resection for tumor, partial right 01/30/2014  1 of 3 FINAL for Demmer, Korissa A (SZ([id number](continued) INVASIVE ADENOCARCINOMA, INVADING THROUGH THE MUSCULARIS PROPRIA INTO PERICOLONIC FATTY TISSUE. ONE OF FIFTEEN LYMPH NODES, POSITIVE FOR METASTATIC ADENOCARCINOMA (1/15). RESECTION MARGINS, NEGATIVE FOR TUMOR. APPENDIX: BENIGN APPENDICEAL TISSUE, NO EVIDENCE OF ACTIVE INFLAMMATION OR NEOPLASM. Specimen: Right colon. Procedure: Segmental resection Tumor site: Ascending colon Specimen integrity: Intact Macroscopic intactness of mesorectum: Not applicable. Macroscopic tumor perforation: N/A Invasive tumor: Maximum size: 5.0 cm Histologic type(s): Invasive adenocarcinoma with extracellular mucin Histologic grade and differentiation: G2: moderately differentiated/low grade Type of polyp in which invasive carcinoma arose: N/A Microscopic extension of invasive tumor: invading through muscularis propria into pericolonic fatty tissue. Lymph-Vascular invasion: Not identified Peri-neural invasion: No Tumor deposit(s) (discontinuous extramural extension): No Resection margins: Negative Proximal margin: 10 cm Distal margin: 9 cm Circumferential (radial) (posterior ascending, posterior descending; lateral and posterior mid-rectum; and entire lower 1/3 rectum): 3 cm Treatment effect (neo-adjuvant therapy): No Additional polyp(s): N/A Non-neoplastic findings: N/A Lymph nodes: number examined 1; number positive: 15 Pathologic Staging: p T3, N1a, Mx Ancillary studies: MMR stains will be RADIOGRAPHIC STUDIES:  COMPARISON: Multiple priors  ACR  Breast Density Category b: There are scattered areas of  fibroglandular density.  FINDINGS:  The patient has had a right lumpectomy. No new mass or suspicious  calcifications are seen in either breast. Compared to priors.  Mammographic images were processed with CAD.  IMPRESSION:  No mammographic evidence of local recurrence, right breast. No  mammographic evidence of malignancy, left breast.  RECOMMENDATION:  Diagnostic mammography in 1 year.  I have discussed the findings and recommendations with the patient.  Results were also provided in writing at the conclusion of the  visit. If applicable, a reminder letter will be sent to the patient  regarding the next appointment.  BI-RADS CATEGORY 1: Negative  Electronically Signed  By: KelDuke SalviaD.  On: 12/02/2012 11:47   ADDITIONAL INFORMATION: Mismatch Repair (MMR) Protein Immunohistochemistry (IHC) IHC Expression Result: MLH1: LOSS OF NUCLEAR EXPRESSION (LESS THAN 5% TUMOR EXPRESSION) MSH2: Preserved nuclear expression (greater 50% tumor expression) MSH6: Preserved nuclear expression (greater 50% tumor expression) PMS2: LOSS OF NUCLEAR EXPRESSION (LESS THAN 5% TUMOR EXPRESSION) * Internal control demonstrates intact nuclear expression Interpretation: ABNORMAL There is loss of the major and minor MMR proteins MLH1 and PMS2. The loss of expression may be secondary to promoter hyper-methylation, gene mutation or other genetic event. BRAF mutation testing and/or MLH1 methylation testing is indicated. The presence of a BRAF mutation and/or MLH1 hyper-methylation is indicative of a sporadic-type tumor. The absence of either BRAF mutation and/or presence of normal-methylation indicate the possible presence of a hereditary germline mutation (e.g. Lynch syndrome) and referral to genetic counseling is warranted. It is recommended that the loss of protein expression be correlated with molecular based MSI testing.   wil  Bone Marrow,  Aspirate,Biopsy, and Clot, right iliac 04/16/2014 - HYPERCELLULAR BONE MARROW FOR AGE WITH GRANULOCYTIC PROLIFERATION INCLUDING NEUTROPHILIA AND EOSINOPHILIA. - MEGAKARYOCYTIC PROLIFERATION. - SEE COMMENT. PERIPHERAL BLOOD: - LEUKOCYTOSIS WITH NEUTROPHILIA, EOSINOPHILIA AND BASOPHILIA. Diagnosis Note  It is unclear whether the morphologic features represent a secondary or a primary clonal process particularly a myeloproliferative myeloproliferative/myelodysplastic process with eosinophilia. Previous FISH studies performed on peripheral blood including PDGFRA, PGDFRB, FGFR1, BCR-ABL, and CBFB are all negative. Selected FISH studies will be repeated on bone marrow material and the results reported under separate cover. There is no increase in blastic cells or evidence of metastatic carcinoma in this material.    ASSESSMENT: 81 y.o. [place] woman  1. Hypereosinophilia syndrome (HES), vs myeloproliferative neoplasm  -Her WBC was normal until June 2015. Her total WBC has been significantly increased since Nov 2015, with predominant neutrophils and eosinophils. Her basophil has been slightly elevated also. Her absolute eosinophils count is in the range 6-8K/ul -Her bone marrow was hypercellular, with granularcytic proliferation including neutrophilia and eosinophilia. The eosinophil count in bone marrow was 14%  -BCR/ABL and JAK2 gene mutation( -). -Both blood and bone marrow FISH testing for PDGFRA, PGDFRB, FGFR1, BCR-ABL, and CBFB are all negative. -Her skin rash biopsy showed perivascular eosinophil infiltrates, which is supported for hypereosinophil syndrome -Giving the significant elevated eosinophil counts and skin involvement, she meets the criteria for hypereosinophilia syndrome. However reactive eosinophilia is not ruled out. -Her chest CT and echocardiogram were unremarkable.  -HES patients with skin urticaria, has less cardiac and neuro manifestations, and has a better  prognosis. -Giving the negative PDGFR, she would not benefit from [place]. -Her skin rash responded to steroids very well, but her WBC count has been persistently increasing, despite intermittent steroids therapy, WBC 91K today, and she also has headaches, fatigue, which could be secondary to her significant leukocytosis -Her bone marrow biopsy has some features of myeloproliferative neoplasm, given her rapid increase of total white count, I suspect she has myeloproliferative neoplasm. -I recommend her to start Hydrea 500 mg daily, to control her leukocytosis. We'll monitor her CBC closely, and I'll just Hydrea dose as needed.  2.  Right colon cancer, pT3N1aM0, stage IIIB, G2, MSI high -I reviewed her surgical past findings with her and her husband, daughter in great details.  -I reviewed her staging CT of chest, which is negative for metastasis. -Given her locally advanced stage and elevated preoperative CEA, she has high risks of cancer recurrence, approximately 50%, especially in the first few years. -I discussed that the standard care is adjuvant chemotherapy with FOLFOX for stage III colon cancer. However given her advanced age, limited performance status after surgery, I do not think she is a good candidate for adjuvant chemotherapy. Her tumor has high microsatellite instability, which is a good prognostic marker, also predicts a poor response to single agent 5-FU.  -After lengthy discussion, we agreed to observe her closely after surgery. I discussed the surveillance plan, with lab and physical exam every 3-4 months, repeat colonoscopy and CT scan in one year. -We also discussed the treatment option if she has cancer recurrence in the future, which includes surgery, systemic therapy especially immunotherapy for metastatic disease. Clinical data has shown to accelerate response (70%) with PD1 antibody to colon cancer with MSI high.  -Her CEA has been normal, we'll continue  surveillance -Colonoscopy and CT scan in December 2016  3. GENETICS -She underwent genetic test, which was negative for Lynch syndrome. -Her genetic test from blood revealed QQ59 mutation, uncertain this is germline versus somatic mutation related to her hypereosinophilia syndrome. -She denied further somatic mutation test (such as punch skin biopsy)  3. History of right breast DCIS, ER/PR positive. -The patient previously couldn't tolerate  aromatase inhibitor.  -No evidence of disease recurrence on exam. -Continue observation  -Continue yearly mammogram  Follow-up:  -Start Hydrea 500 mg daily, will increase to 500 mg twice daily if her WBC is still significantly high in 2 weeks -Repeat a CBC in 2 weeks, I'll see her back in 4 weeks  I spent 30 minutes, more than 50% time face to face counseling for her visit.  Truitt Merle, MD

## 2014-10-15 ENCOUNTER — Ambulatory Visit: Payer: Medicare Other | Admitting: Oncology

## 2014-10-16 ENCOUNTER — Other Ambulatory Visit: Payer: Self-pay | Admitting: *Deleted

## 2014-10-16 ENCOUNTER — Telehealth: Payer: Self-pay | Admitting: *Deleted

## 2014-10-16 DIAGNOSIS — C189 Malignant neoplasm of colon, unspecified: Secondary | ICD-10-CM

## 2014-10-16 MED ORDER — TRAMADOL HCL 50 MG PO TABS: 25.0000 mg | ORAL_TABLET | Freq: Three times a day (TID) | ORAL | Status: DC | PRN

## 2014-10-16 NOTE — Telephone Encounter (Signed)
Called pt to see how pt was doing since taking Hydrea last Wed.  Per pt, she is still tired, and has no appetite.  Still has bad headache, and has been taking 4 Tylenols daily.  Pt also complained of bilateral legs pain from knees up  - started approx. 1 hour after waking up for about 4 days.  Pt stated pain was relieved after taking Tylenol.  Denied swelling, denied redness or warm to touch, denied itching.  Pt able to ambulate normally.  Informed pt that CEA level was normal as per md.   Dr. Burr Medico notified. Spoke with pt again.  Informed pt that per Dr. Burr Medico,  Since pt's WBC was high, this could have contributed to her headache and legs pain.  Hopefully, Hydrea will help with high WBCs.  Pt aware of lab appt on 9/28.   Informed pt that a pain medication will be called in to her pharmacy to help with leg pain - pt understood not to take too many Tylenol tabs.  Pt understood to monitor for constipation, and force fluids as tolerated.

## 2014-10-18 ENCOUNTER — Other Ambulatory Visit: Payer: Self-pay | Admitting: Internal Medicine

## 2014-10-22 ENCOUNTER — Telehealth: Payer: Self-pay | Admitting: Hematology

## 2014-10-22 ENCOUNTER — Ambulatory Visit (HOSPITAL_BASED_OUTPATIENT_CLINIC_OR_DEPARTMENT_OTHER): Payer: Medicare Other | Admitting: Nurse Practitioner

## 2014-10-22 ENCOUNTER — Telehealth: Payer: Self-pay | Admitting: *Deleted

## 2014-10-22 ENCOUNTER — Ambulatory Visit: Payer: Medicare Other

## 2014-10-22 ENCOUNTER — Other Ambulatory Visit: Payer: Self-pay | Admitting: Hematology

## 2014-10-22 ENCOUNTER — Telehealth: Payer: Self-pay | Admitting: Nurse Practitioner

## 2014-10-22 ENCOUNTER — Ambulatory Visit (HOSPITAL_BASED_OUTPATIENT_CLINIC_OR_DEPARTMENT_OTHER): Payer: Medicare Other

## 2014-10-22 ENCOUNTER — Other Ambulatory Visit: Payer: Self-pay | Admitting: *Deleted

## 2014-10-22 VITALS — BP 141/46 | HR 105 | Temp 98.4°F | Resp 18 | Ht 65.0 in | Wt 124.5 lb

## 2014-10-22 DIAGNOSIS — D72119 Hypereosinophilic syndrome (hes), unspecified: Secondary | ICD-10-CM

## 2014-10-22 DIAGNOSIS — E860 Dehydration: Secondary | ICD-10-CM | POA: Diagnosis not present

## 2014-10-22 DIAGNOSIS — C189 Malignant neoplasm of colon, unspecified: Secondary | ICD-10-CM | POA: Diagnosis not present

## 2014-10-22 DIAGNOSIS — Z853 Personal history of malignant neoplasm of breast: Secondary | ICD-10-CM | POA: Diagnosis not present

## 2014-10-22 DIAGNOSIS — R350 Frequency of micturition: Secondary | ICD-10-CM

## 2014-10-22 DIAGNOSIS — R112 Nausea with vomiting, unspecified: Secondary | ICD-10-CM

## 2014-10-22 DIAGNOSIS — D721 Eosinophilia: Secondary | ICD-10-CM

## 2014-10-22 DIAGNOSIS — D72829 Elevated white blood cell count, unspecified: Secondary | ICD-10-CM

## 2014-10-22 DIAGNOSIS — E876 Hypokalemia: Secondary | ICD-10-CM

## 2014-10-22 LAB — COMPREHENSIVE METABOLIC PANEL (CC13)
ALT: 22 U/L (ref 0–55)
ANION GAP: 9 meq/L (ref 3–11)
AST: 19 U/L (ref 5–34)
Albumin: 3.9 g/dL (ref 3.5–5.0)
Alkaline Phosphatase: 88 U/L (ref 40–150)
BUN: 12.1 mg/dL (ref 7.0–26.0)
CHLORIDE: 106 meq/L (ref 98–109)
CO2: 25 meq/L (ref 22–29)
Calcium: 9.1 mg/dL (ref 8.4–10.4)
Creatinine: 0.8 mg/dL (ref 0.6–1.1)
EGFR: 67 mL/min/{1.73_m2} — AB (ref >90)
Glucose: 142 mg/dl — ABNORMAL HIGH (ref 70–140)
POTASSIUM: 3.3 meq/L — AB (ref 3.5–5.1)
Sodium: 139 mEq/L (ref 136–145)
Total Bilirubin: 0.68 mg/dL (ref 0.20–1.20)
Total Protein: 6.7 g/dL (ref 6.4–8.3)

## 2014-10-22 LAB — CBC & DIFF AND RETIC
BASO%: 0.5 % (ref 0.0–2.0)
BASOS ABS: 0.5 10*3/uL — AB (ref 0.0–0.1)
EOS%: 1.6 % (ref 0.0–7.0)
Eosinophils Absolute: 1.7 10*3/uL — ABNORMAL HIGH (ref 0.0–0.5)
HEMATOCRIT: 33.5 % — AB (ref 34.8–46.6)
HGB: 10.4 g/dL — ABNORMAL LOW (ref 11.6–15.9)
Immature Retic Fract: 24.9 % — ABNORMAL HIGH (ref 1.60–10.00)
LYMPH%: 4 % — AB (ref 14.0–49.7)
MCH: 25.1 pg (ref 25.1–34.0)
MCHC: 31.2 g/dL — AB (ref 31.5–36.0)
MCV: 80.6 fL (ref 79.5–101.0)
MONO#: 2 10*3/uL — ABNORMAL HIGH (ref 0.1–0.9)
MONO%: 1.8 % (ref 0.0–14.0)
NEUT#: 98.6 10*3/uL — ABNORMAL HIGH (ref 1.5–6.5)
NEUT%: 92.1 % — AB (ref 38.4–76.8)
Platelets: 125 10*3/uL — ABNORMAL LOW (ref 145–400)
RBC: 4.15 10*6/uL (ref 3.70–5.45)
RDW: 18 % — ABNORMAL HIGH (ref 11.2–14.5)
Retic %: 2.44 % — ABNORMAL HIGH (ref 0.70–2.10)
Retic Ct Abs: 101.26 10*3/uL — ABNORMAL HIGH (ref 33.70–90.70)
WBC: 107.1 10*3/uL (ref 3.9–10.3)
lymph#: 4.3 10*3/uL — ABNORMAL HIGH (ref 0.9–3.3)

## 2014-10-22 LAB — TECHNOLOGIST REVIEW: Technologist Review: 2

## 2014-10-22 MED ORDER — SODIUM CHLORIDE 0.9 % IV SOLN
Freq: Once | INTRAVENOUS | Status: AC
  Administered 2014-10-22: 17:00:00 via INTRAVENOUS

## 2014-10-22 MED ORDER — HYDROCODONE-ACETAMINOPHEN 5-325 MG PO TABS: 1.0000 | ORAL_TABLET | Freq: Three times a day (TID) | ORAL | Status: DC | PRN

## 2014-10-22 MED ORDER — POTASSIUM CHLORIDE CRYS ER 20 MEQ PO TBCR: 20.0000 meq | EXTENDED_RELEASE_TABLET | Freq: Every day | ORAL | Status: DC

## 2014-10-22 MED ORDER — PROCHLORPERAZINE MALEATE 10 MG PO TABS: 10.0000 mg | ORAL_TABLET | Freq: Four times a day (QID) | ORAL | Status: DC | PRN

## 2014-10-22 NOTE — Telephone Encounter (Signed)
PT.'S SWEATING DURING THE DAY AND AT NIGHT IS GETTING WORSE. PT. THOUGHT SHE MAY HAVE A FEVER BUT DID NOT HAVE A THERMOMETER. HER HUSBAND IS GETTING A THERMOMETER TODAY. PT. IS DIZZY AND HAS A HEADACHE. HER FLUID INTAKE IN THE PAST 24 HOURS HAS BEEN 48 OUNCES. ENCOURAGED PT. TO INCREASE HER FLUID INTAKE TO 64 OUNCES. PT.'S APPETITE IS DECREASED AND SHE IS LOSING WEIGHT. HER WEIGHT IS NOW 121. PT. IS STILL HAVING AN ACHY PAIN AT A SCALE OF SIX FROM HER KNEES UP HER LEGS. THE TRAMADOL IS NOT HELPING. PT. IS ALSO HAVING CRAMPS IN HER FEET AND TOES. PT. WOULD LIKE TO BE SEEN BY DR.FENG THIS WEEK. SHE HAS LABS ON Wednesday. COULD CYNDEE BACON BE AN OPTION?

## 2014-10-22 NOTE — Progress Notes (Signed)
CBC w/ diff and retic, CMET entered; POF sent to scheduler to add lab and CB appts; pt and husband made aware of appt date/time

## 2014-10-22 NOTE — Telephone Encounter (Signed)
April [name] to see pt today.

## 2014-10-22 NOTE — Telephone Encounter (Signed)
Called and left a message with new appointment for 9/30

## 2014-10-22 NOTE — Telephone Encounter (Signed)
Per pof patient is aware of appointments today

## 2014-10-22 NOTE — Patient Instructions (Signed)
Dehydration, Adult Dehydration is when you lose more fluids from the body than you take in. Vital organs like the kidneys, brain, and heart cannot function without a proper amount of fluids and salt. Any loss of fluids from the body can cause dehydration.  CAUSES   Vomiting.  Diarrhea.  Excessive sweating.  Excessive urine output.  Fever. SYMPTOMS  Mild dehydration  Thirst.  Dry lips.  Slightly dry mouth. Moderate dehydration  Very dry mouth.  Sunken eyes.  Skin does not bounce back quickly when lightly pinched and released.  Dark urine and decreased urine production.  Decreased tear production.  Headache. Severe dehydration  Very dry mouth.  Extreme thirst.  Rapid, weak pulse (more than 100 beats per minute at rest).  Cold hands and feet.  Not able to sweat in spite of heat and temperature.  Rapid breathing.  Blue lips.  Confusion and lethargy.  Difficulty being awakened.  Minimal urine production.  No tears. DIAGNOSIS  Your caregiver will diagnose dehydration based on your symptoms and your exam. Blood and urine tests will help confirm the diagnosis. The diagnostic evaluation should also identify the cause of dehydration. TREATMENT  Treatment of mild or moderate dehydration can often be done at home by increasing the amount of fluids that you drink. It is best to drink small amounts of fluid more often. Drinking too much at one time can make vomiting worse. Refer to the home care instructions below. Severe dehydration needs to be treated at the hospital where you will probably be given intravenous (IV) fluids that contain water and electrolytes. HOME CARE INSTRUCTIONS   Ask your caregiver about specific rehydration instructions.  Drink enough fluids to keep your urine clear or pale yellow.  Drink small amounts frequently if you have nausea and vomiting.  Eat as you normally do.  Avoid:  Foods or drinks high in sugar.  Carbonated  drinks.  Juice.  Extremely hot or cold fluids.  Drinks with caffeine.  Fatty, greasy foods.  Alcohol.  Tobacco.  Overeating.  Gelatin desserts.  Wash your hands well to avoid spreading bacteria and viruses.  Only take over-the-counter or prescription medicines for pain, discomfort, or fever as directed by your caregiver.  Ask your caregiver if you should continue all prescribed and over-the-counter medicines.  Keep all follow-up appointments with your caregiver. SEEK MEDICAL CARE IF:  You have abdominal pain and it increases or stays in one area (localizes).  You have a rash, stiff neck, or severe headache.  You are irritable, sleepy, or difficult to awaken.  You are weak, dizzy, or extremely thirsty. SEEK IMMEDIATE MEDICAL CARE IF:   You are unable to keep fluids down or you get worse despite treatment.  You have frequent episodes of vomiting or diarrhea.  You have blood or green matter (bile) in your vomit.  You have blood in your stool or your stool looks black and tarry.  You have not urinated in 6 to 8 hours, or you have only urinated a small amount of very dark urine.  You have a fever.  You faint. MAKE SURE YOU:   Understand these instructions.  Will watch your condition.  Will get help right away if you are not doing well or get worse. Document Released: 01/12/2005 Document Revised: 04/06/2011 Document Reviewed: 09/01/2010 ExitCare Patient Information 2015 [employer]. This information is not intended to replace advice given to you by your health care provider. Make sure you discuss any questions you have with your health care   provider.  

## 2014-10-23 ENCOUNTER — Ambulatory Visit (HOSPITAL_BASED_OUTPATIENT_CLINIC_OR_DEPARTMENT_OTHER): Payer: Medicare Other

## 2014-10-23 ENCOUNTER — Telehealth: Payer: Self-pay | Admitting: *Deleted

## 2014-10-23 ENCOUNTER — Encounter: Payer: Self-pay | Admitting: Nurse Practitioner

## 2014-10-23 ENCOUNTER — Ambulatory Visit (HOSPITAL_COMMUNITY)
Admission: RE | Admit: 2014-10-23 | Discharge: 2014-10-23 | Disposition: A | Discharge status: Home / Self Care | Payer: Medicare Other | Source: Ambulatory Visit | Attending: Nurse Practitioner | Admitting: Nurse Practitioner

## 2014-10-23 ENCOUNTER — Other Ambulatory Visit: Payer: Self-pay | Admitting: *Deleted

## 2014-10-23 DIAGNOSIS — R110 Nausea: Secondary | ICD-10-CM | POA: Diagnosis not present

## 2014-10-23 DIAGNOSIS — C189 Malignant neoplasm of colon, unspecified: Secondary | ICD-10-CM

## 2014-10-23 DIAGNOSIS — R5383 Other fatigue: Secondary | ICD-10-CM | POA: Insufficient documentation

## 2014-10-23 DIAGNOSIS — R350 Frequency of micturition: Secondary | ICD-10-CM

## 2014-10-23 DIAGNOSIS — E860 Dehydration: Secondary | ICD-10-CM

## 2014-10-23 DIAGNOSIS — D72829 Elevated white blood cell count, unspecified: Secondary | ICD-10-CM | POA: Diagnosis not present

## 2014-10-23 DIAGNOSIS — Z853 Personal history of malignant neoplasm of breast: Secondary | ICD-10-CM | POA: Diagnosis not present

## 2014-10-23 DIAGNOSIS — D72119 Hypereosinophilic syndrome (hes), unspecified: Secondary | ICD-10-CM

## 2014-10-23 DIAGNOSIS — D721 Eosinophilia: Secondary | ICD-10-CM

## 2014-10-23 DIAGNOSIS — R112 Nausea with vomiting, unspecified: Secondary | ICD-10-CM | POA: Insufficient documentation

## 2014-10-23 DIAGNOSIS — E876 Hypokalemia: Secondary | ICD-10-CM | POA: Insufficient documentation

## 2014-10-23 LAB — URINALYSIS, MICROSCOPIC - CHCC
BILIRUBIN (URINE): NEGATIVE
Glucose: NEGATIVE mg/dL
KETONES: NEGATIVE mg/dL
Nitrite: NEGATIVE
Protein: NEGATIVE mg/dL
SPECIFIC GRAVITY, URINE: 1.01 (ref 1.003–1.035)
Urobilinogen, UR: 0.2 mg/dL (ref 0.2–1)
pH: 6 (ref 4.6–8.0)

## 2014-10-23 NOTE — Assessment & Plan Note (Addendum)
Patient has been taking Hydrea 500 mg on a daily basis for treatment of her chronic elevated white count.  Patient reports multitude of symptoms today; which include chronic headache, both day and night sweats, intermittent dizziness, minimal appetite, nausea/vomiting, increased fatigue and weakness, and dehydration.  She denies any recent fevers or chills.  Blood counts obtained today revealed WBC of 107.1, ANC 98.6, hemoglobin 10.4, and platelet count 125.  Vital signs essentially stable today; with temperature 98.4.  On exam patient appears fatigued and slightly weak; but nontoxic.  Dr. Burr Medico in to evaluate patient as well; and decision was made to obtain urinalysis, urine culture, blood cultures, and chest x-ray to rule out any type of infection.  Due to the late hour daily-patient made the decision to return in the morning for both her urine sample and chest x-ray.  Blood cultures obtained this afternoon; and are pending results.  Patient will increase her Hydrea to 500 mg 3 times per day to see if that helps with her white count.  Patient will return this coming Friday, 10/26/2014 for follow-up labs and visit.  She knows to call/return to go directly to the emergency department for any worsening symptoms in the meantime.

## 2014-10-23 NOTE — Telephone Encounter (Signed)
UA pending; pt reports she is feeling much better today after receiving fluids yesterday; no other needs identified at this time.  Pt reports eating and drinking a little better today.

## 2014-10-23 NOTE — Assessment & Plan Note (Signed)
Patient reports some chronic nausea within the past few weeks.  She states she vomited 1 earlier this morning.  She feels dehydrated today.  Most likely, chronic nausea is secondary to her elevated white count.  Patient will be prescribed Compazine to try at home for her nausea.  Also, patient will receive IV fluid rehydration while at the cancer Center today.

## 2014-10-23 NOTE — Assessment & Plan Note (Addendum)
Potassium is decreased to 3.3 today.  Patient reports some increased cramping to her feet and toes.  Patient will be prescribed potassium 20 mEq to take on a daily basis.  She was also encouraged to push potassium in her diet as well.

## 2014-10-23 NOTE — Assessment & Plan Note (Signed)
Patient is complaining of some chronic nausea; and vomited 1 today.  She feels dehydrated.  Patient states that she has also had minimal appetite as well.  She will receive IV fluid rehydration while cancer Center today; was encouraged to push fluids at home.

## 2014-10-23 NOTE — Assessment & Plan Note (Signed)
Patient is status post colon resection and ostomy.  Patient is currently undergoing observation only for colon cancer.  She is scheduled for a repeat colonoscopy and restaging scan in December 2016.

## 2014-10-23 NOTE — Assessment & Plan Note (Signed)
Patient was previously diagnosed with right breast cancer; and is status post lumpectomy.  She is currently undergoing observation for her breast cancer.  She will continue to receive mammograms on a yearly basis.

## 2014-10-23 NOTE — Progress Notes (Signed)
SYMPTOM MANAGEMENT CLINIC   HPI: April [name] 81 y.o. female diagnosed with breast cancer, colon cancer, and hypereosinophilic syndrome.  Currently undergoing Hydrea therapy.   Patient has been taking Hydrea 500 mg on a daily basis for treatment of her chronic elevated white count.  Patient reports multitude of symptoms today; which include chronic headache, both day and night sweats, intermittent dizziness, minimal appetite, nausea/vomiting, increased fatigue and weakness, and dehydration.  She denies any recent fevers or chills.  HPI  ROS  Past Medical History  Diagnosis Date  . History of breast cancer onocologist--  dr Jana Hakim--  no recurrence    dx 2011-- DCIS, Right breast,  ER/PR positive --- s/p lumpectomy  No radiation, took Aromasen 2012  . Senile osteoporosis     Took Fosamax x15years  . GERD (gastroesophageal reflux disease)   . Sigmoid diverticulosis   . Wears hearing aid     bilateral  . PMB (postmenopausal bleeding)   . Colon cancer oncologist--  dr Truitt Merle    dx jan 2016  --  Ascending colon carcinoma,  Stage IIIB,  pT3 N1a M0,  Grade 2,  MSI,  1 of 15 nodes +//   s/p  right colectomy  . Hypereosinophilic syndrome   . Chronic diarrhea     Past Surgical History  Procedure Laterality Date  . Cataract extraction w/ intraocular lens  implant, bilateral Bilateral 9977,4142  . Tonsillectomy and adenoidectomy  1930  . Colonoscopy N/A 01/28/2014    Procedure: COLONOSCOPY;  Surgeon: Missy Sabins, MD;  Location: [facility];  Service: Endoscopy;  Laterality: N/A;  . Esophagogastroduodenoscopy N/A 01/28/2014    Procedure: ESOPHAGOGASTRODUODENOSCOPY (EGD);  Surgeon: Missy Sabins, MD;  Location: [facility];  Service: Endoscopy;  Laterality: N/A;  . Laparoscopic partial right colectomy Right 01/30/2014    Procedure: LAPAROSCOPIC PARTIAL RIGHT COLECTOMY;  Surgeon: Pedro Earls, MD;  Location: [facility];  Service: General;  Laterality: Right;  . Laparotomy N/A 01/31/2014    Procedure: EXPLORATORY LAPAROTOMY, RESECTION OF PRIOR SMALL BOWEL ANASTOMOSIS, UPPER ENDOSCOPY, CREATED A  NEW ILEO COLOSTOMY;  Surgeon: Pedro Earls, MD;  Location: [facility];  Service: General;  Laterality: N/A;  . Laparoscopic cholecystectomy  05-26-2009  . Breast lumpectomy Right 12-24-2009  . Transthoracic echocardiogram  04-18-2014    mild LVH/  ef 39-53%/  grade I diastolic dysfunction/  mild LAE/  trivial MR and TR  . Dilatation & currettage/hysteroscopy with resectocope N/A 05/16/2014    Procedure: DILATATION & CURETTAGE/HYSTEROSCOPY WITH RESECTOCOPE;  Surgeon: Arvella Nigh, MD;  Location: [facility];  Service: Gynecology;  Laterality: N/A;  . Laparoscopy N/A 05/16/2014    Procedure: LAPAROSCOPY DIAGNOSTIC;  Surgeon: Arvella Nigh, MD;  Location: [facility];  Service: Gynecology;  Laterality: N/A;    has hx: breast cancer, DCIS, right, receptor +; Vertebral compression fracture; Senile osteoporosis; Colonic hemorrhage; Thickened endometrium; Acute blood loss anemia; Rectal bleeding; Colonic mass; Colon cancer; Lap assisted right hemicolectomy Jan 2016; Hematochezia; Leukocytosis; Diarrhea; Increased endometrial stripe thickness; MPN (myeloproliferative neoplasm); Hypereosinophilic syndrome; Genetic testing; Fluid in endometrial cavity; Dehydration; Nausea with vomiting; and Hypokalemia on her problem list.    is allergic to ativan; codeine; and sulfa antibiotics.    Medication List       This list is accurate as of: 10/22/14 11:59 PM.  Always use your most recent med list.               acetaminophen 500 MG tablet  Commonly known as:  TYLENOL  Take 500 mg by mouth every 6 (six) hours as needed. Taking twice daily for headaches     ALIGN PO  Take 1 tablet by mouth daily.     Calcium Carbonate-Vitamin D 600-400 MG-UNIT tablet  Take 1 tablet by mouth daily.     CENTRUM SILVER ADULT 50+ PO  Take 1 tablet by mouth daily.     cholecalciferol 1000  UNITS tablet  Commonly known as:  VITAMIN D  Take 4,000 Units by mouth at bedtime.     fexofenadine 30 MG tablet  Commonly known as:  ALLEGRA  Take 30 mg by mouth every morning.     FUSION PLUS Caps  TAKE 1 CAPSULE BY MOUTH ONCE DAILY     FUSION PO  Take 1 tablet by mouth daily.     HYDROcodone-acetaminophen 5-325 MG tablet  Commonly known as:  NORCO/VICODIN  Take 1 tablet by mouth every 8 (eight) hours as needed for moderate pain.     hydroxyurea 500 MG capsule  Commonly known as:  HYDREA  One capsule in am & 2 capsules in the pm     MICRONIZED COLESTIPOL HCL 1 G tablet  Generic drug:  colestipol  Take 1 g by mouth daily.     OVER THE COUNTER MEDICATION  Take 1 tablet by mouth at bedtime. Macular supplement     potassium chloride SA 20 MEQ tablet  Commonly known as:  K-DUR,KLOR-CON  Take 1 tablet (20 mEq total) by mouth daily.     prochlorperazine 10 MG tablet  Commonly known as:  COMPAZINE  Take 1 tablet (10 mg total) by mouth every 6 (six) hours as needed for nausea or vomiting.     ranitidine 150 MG capsule  Commonly known as:  ZANTAC  Take 1 capsule by mouth every morning.     sertraline 50 MG tablet  Commonly known as:  ZOLOFT  Take 1 tablet (50 mg total) by mouth daily.     traMADol 50 MG tablet  Commonly known as:  ULTRAM  Take 0.5-1 tablets (25-50 mg total) by mouth every 8 (eight) hours as needed.         PHYSICAL EXAMINATION  Oncology Vitals 10/22/2014 10/10/2014 08/10/2014 06/01/2014 05/23/2014 05/16/2014 05/16/2014  Height 165 cm 165 cm 165 cm 165 cm 165 cm - -  Weight 56.473 kg 56.972 kg 59.648 kg 60.51 kg 62.143 kg - -  Weight (lbs) 124 lbs 8 oz 125 lbs 10 oz 131 lbs 8 oz 133 lbs 6 oz 137 lbs - -  BMI (kg/m2) 20.72 kg/m2 20.9 kg/m2 21.88 kg/m2 22.2 kg/m2 22.8 kg/m2 - -  Temp 98.4 98.2 98.1 97.9 98 - -  Pulse 105 95 79 92 84 64 62  Resp _0 Resp (Historical as of 08/27/11) - - - - - - -  SpO2 100 100 98 99 97 96 98  BSA (m2) 1.61  m2 1.62 m2 1.65 m2 1.67 m2 1.69 m2 - -   BP Readings from Last 3 Encounters:  10/22/14 141/46  10/10/14 116/90  08/10/14 120/52    Physical Exam  Constitutional: She is oriented to person, place, and time.  Patient appears fatigued, slightly weak, and chronically ill.  HENT:  Head: Normocephalic and atraumatic.  Mouth/Throat: Oropharynx is clear and moist.  Eyes: Conjunctivae and EOM are normal. Pupils are equal, round, and reactive to light. Right eye exhibits no discharge. Left eye exhibits no  discharge. No scleral icterus.  Neck: Normal range of motion. Neck supple.  Pulmonary/Chest: Effort normal. No respiratory distress.  Musculoskeletal: Normal range of motion.  Neurological: She is alert and oriented to person, place, and time. Gait normal.  Skin: Skin is warm and dry.  Psychiatric: Affect normal.  Nursing note and vitals reviewed.   LABORATORY DATA:. Appointment on 10/22/2014  Component Date Value Ref Range Status  . WBC 10/22/2014 107.1* 3.9 - 10.3 10e3/uL Final  . NEUT# 10/22/2014 98.6* 1.5 - 6.5 10e3/uL Final  . HGB 10/22/2014 10.4* 11.6 - 15.9 g/dL Final  . HCT 10/22/2014 33.5* 34.8 - 46.6 % Final  . Platelets 10/22/2014 125* 145 - 400 10e3/uL Final  . MCV 10/22/2014 80.6  79.5 - 101.0 fL Final  . MCH 10/22/2014 25.1  25.1 - 34.0 pg Final  . MCHC 10/22/2014 31.2* 31.5 - 36.0 g/dL Final  . RBC 10/22/2014 4.15  3.70 - 5.45 10e6/uL Final  . RDW 10/22/2014 18.0* 11.2 - 14.5 % Final  . lymph# 10/22/2014 4.3* 0.9 - 3.3 10e3/uL Final  . MONO# 10/22/2014 2.0* 0.1 - 0.9 10e3/uL Final  . Eosinophils Absolute 10/22/2014 1.7* 0.0 - 0.5 10e3/uL Final  . Basophils Absolute 10/22/2014 0.5* 0.0 - 0.1 10e3/uL Final  . NEUT% 10/22/2014 92.1* 38.4 - 76.8 % Final  . LYMPH% 10/22/2014 4.0* 14.0 - 49.7 % Final  . MONO% 10/22/2014 1.8  0.0 - 14.0 % Final  . EOS% 10/22/2014 1.6  0.0 - 7.0 % Final  . BASO% 10/22/2014 0.5  0.0 - 2.0 % Final  . Retic % 10/22/2014 2.44* 0.70 - 2.10 %  Final  . Retic Ct Abs 10/22/2014 101.26* 33.70 - 90.70 10e3/uL Final  . Immature Retic Fract 10/22/2014 24.90* 1.60 - 10.00 % Final  . Sodium 10/22/2014 139  136 - 145 mEq/L Final  . Potassium 10/22/2014 3.3* 3.5 - 5.1 mEq/L Final  . Chloride 10/22/2014 106  98 - 109 mEq/L Final  . CO2 10/22/2014 25  22 - 29 mEq/L Final  . Glucose 10/22/2014 142* 70 - 140 mg/dl Final   Glucose reference range is for nonfasting patients. Fasting glucose reference range is 70- 100.  [name] BUN 10/22/2014 12.1  7.0 - 26.0 mg/dL Final  . Creatinine 10/22/2014 0.8  0.6 - 1.1 mg/dL Final  . Total Bilirubin 10/22/2014 0.68  0.20 - 1.20 mg/dL Final  . Alkaline Phosphatase 10/22/2014 88  40 - 150 U/L Final  . AST 10/22/2014 19  5 - 34 U/L Final  . ALT 10/22/2014 22  0 - 55 U/L Final  . Total Protein 10/22/2014 6.7  6.4 - 8.3 g/dL Final  . Albumin 10/22/2014 3.9  3.5 - 5.0 g/dL Final  . Calcium 10/22/2014 9.1  8.4 - 10.4 mg/dL Final  . Anion Gap 10/22/2014 9  3 - 11 mEq/L Final  . EGFR 10/22/2014 67* >90 ml/min/1.73 m2 Final   eGFR is calculated using the CKD-EPI Creatinine Equation (2009)  . Technologist Review 10/22/2014 2% blasts, Metas and Myelocytes present   Final     RADIOGRAPHIC STUDIES: Dg Chest 2 View  10/23/2014   CLINICAL DATA:  History of breast cancer leukocytosis. Nausea and lethargy.  EXAM: CHEST  2 VIEW  COMPARISON:  01/31/2014 chest radiograph.  FINDINGS: Stable cardiomediastinal silhouette with normal heart size. No pneumothorax. No pleural effusion. Clear lungs, with no focal lung consolidation and no pulmonary edema. Mild to moderate degenerative changes in the thoracic spine. Cholecystectomy clips in the right upper quadrant.  IMPRESSION: No active cardiopulmonary disease.   Electronically Signed   By: Ilona Sorrel M.D.   On: 10/23/2014 08:59    ASSESSMENT/PLAN:    Nausea with vomiting Patient reports some chronic nausea within the past few weeks.  She states she vomited 1 earlier this  morning.  She feels dehydrated today.  Most likely, chronic nausea is secondary to her elevated white count.  Patient will be prescribed Compazine to try at home for her nausea.  Also, patient will receive IV fluid rehydration while at the cancer Center today.  Hypokalemia Potassium is decreased to 3.3 today.  Patient reports some increased cramping to her feet and toes.  Patient will be prescribed potassium 20 mEq to take on a daily basis.  She was also encouraged to push potassium in her diet as well.  Hypereosinophilic syndrome Patient has been taking Hydrea 500 mg on a daily basis for treatment of her chronic elevated white count.  Patient reports multitude of symptoms today; which include chronic headache, both day and night sweats, intermittent dizziness, minimal appetite, nausea/vomiting, increased fatigue and weakness, and dehydration.  She denies any recent fevers or chills.  Blood counts obtained today revealed WBC of 107.1, ANC 98.6, hemoglobin 10.4, and platelet count 125.  Vital signs essentially stable today; with temperature 98.4.  On exam patient appears fatigued and slightly weak; but nontoxic.  Dr. Burr Medico in to evaluate patient as well; and decision was made to obtain urinalysis, urine culture, blood cultures, and chest x-ray to rule out any type of infection.  Due to the late hour daily-patient made the decision to return in the morning for both her urine sample and chest x-ray.  Blood cultures obtained this afternoon; and are pending results.  Patient will increase her Hydrea to 500 mg 3 times per day to see if that helps with her white count.  Patient will return this coming Friday, 10/26/2014 for follow-up labs and visit.  She knows to call/return to go directly to the emergency department for any worsening symptoms in the meantime.  hx: breast cancer, DCIS, right, receptor + Patient was previously diagnosed with right breast cancer; and is status post lumpectomy.  She is  currently undergoing observation for her breast cancer.  She will continue to receive mammograms on a yearly basis.  Dehydration Patient is complaining of some chronic nausea; and vomited 1 today.  She feels dehydrated.  Patient states that she has also had minimal appetite as well.  She will receive IV fluid rehydration while cancer Center today; was encouraged to push fluids at home.  Colon cancer Patient is status post colon resection and ostomy.  Patient is currently undergoing observation only for colon cancer.  She is scheduled for a repeat colonoscopy and restaging scan in December 2016.   Patient stated understanding of all instructions; and was in agreement with this plan of care. The patient knows to call the clinic with any problems, questions or concerns.   This was a shared visit with Dr. Burr Medico today.  Total time spent with patient was 40 minutes;  with greater than 75 percent of that time spent in face to face counseling regarding patient's symptoms,  and coordination of care and follow up.  Disclaimer:This dictation was prepared with Dragon/digital dictation along with [employer]. Any transcriptional errors that result from this process are unintentional.  [name], NP 10/23/2014   Attending addendum I have seen the patient, examined her. I agree with the assessment and and plan and  have edited the notes.   She has not responded to low dose Hydrea, white count continued arising, we'll increase Hydrea to 1500 mg/day. We'll give her IV first today. We'll also obtain ID workup to rule out infection.   Truitt Merle

## 2014-10-24 ENCOUNTER — Other Ambulatory Visit: Payer: Self-pay | Admitting: Hematology

## 2014-10-24 ENCOUNTER — Other Ambulatory Visit: Payer: Medicare Other

## 2014-10-24 DIAGNOSIS — R319 Hematuria, unspecified: Secondary | ICD-10-CM

## 2014-10-24 LAB — URINE CULTURE

## 2014-10-24 MED ORDER — CIPROFLOXACIN HCL 500 MG PO TABS: 500.0000 mg | ORAL_TABLET | Freq: Two times a day (BID) | ORAL | Status: DC

## 2014-10-24 NOTE — Telephone Encounter (Signed)
Per Dr Burr Medico and Selena Lesser NP, Cipro 500mg  BID, #10 sent to pharmacy.  Patient aware.  Patient seeing Dr Burr Medico this Friday, 10/26/14, UA and C/S added to labs.  Patient aware.  Allergies verified.  Patient will call back with any questions or concerns

## 2014-10-24 NOTE — Telephone Encounter (Signed)
Patient's daughter Williemae Area called reporting "Mom has blood in the pads she wears and blood on tissue when she wiped after urinating.  Are the preliminary UA/CS results back yet,we think she has infection and needs antibiotics.  When she urinates she stops and starts frequently.  Please call her with any orders.  Uses [facility] at [facility] and [address]."

## 2014-10-26 ENCOUNTER — Ambulatory Visit (HOSPITAL_BASED_OUTPATIENT_CLINIC_OR_DEPARTMENT_OTHER): Payer: Medicare Other | Admitting: Hematology

## 2014-10-26 ENCOUNTER — Encounter: Payer: Self-pay | Admitting: Hematology

## 2014-10-26 ENCOUNTER — Telehealth: Payer: Self-pay | Admitting: Internal Medicine

## 2014-10-26 ENCOUNTER — Other Ambulatory Visit (HOSPITAL_BASED_OUTPATIENT_CLINIC_OR_DEPARTMENT_OTHER): Payer: Medicare Other

## 2014-10-26 ENCOUNTER — Telehealth: Payer: Self-pay | Admitting: Hematology

## 2014-10-26 VITALS — BP 109/80 | HR 107 | Temp 97.7°F | Resp 18 | Ht 65.0 in | Wt 124.7 lb

## 2014-10-26 DIAGNOSIS — D721 Eosinophilia: Secondary | ICD-10-CM | POA: Diagnosis not present

## 2014-10-26 DIAGNOSIS — F329 Major depressive disorder, single episode, unspecified: Secondary | ICD-10-CM

## 2014-10-26 DIAGNOSIS — C189 Malignant neoplasm of colon, unspecified: Secondary | ICD-10-CM | POA: Diagnosis present

## 2014-10-26 DIAGNOSIS — R319 Hematuria, unspecified: Secondary | ICD-10-CM

## 2014-10-26 DIAGNOSIS — D72119 Hypereosinophilic syndrome (hes), unspecified: Secondary | ICD-10-CM

## 2014-10-26 DIAGNOSIS — Z853 Personal history of malignant neoplasm of breast: Secondary | ICD-10-CM

## 2014-10-26 DIAGNOSIS — F32A Depression, unspecified: Secondary | ICD-10-CM

## 2014-10-26 LAB — CBC & DIFF AND RETIC
BASO%: 0.7 % (ref 0.0–2.0)
Basophils Absolute: 0.7 10*3/uL — ABNORMAL HIGH (ref 0.0–0.1)
EOS%: 1.6 % (ref 0.0–7.0)
Eosinophils Absolute: 1.7 10*3/uL — ABNORMAL HIGH (ref 0.0–0.5)
HEMATOCRIT: 33.7 % — AB (ref 34.8–46.6)
HGB: 11.1 g/dL — ABNORMAL LOW (ref 11.6–15.9)
Immature Retic Fract: 27.8 % — ABNORMAL HIGH (ref 1.60–10.00)
LYMPH#: 4.8 10*3/uL — AB (ref 0.9–3.3)
LYMPH%: 4.5 % — AB (ref 14.0–49.7)
MCH: 27.3 pg (ref 25.1–34.0)
MCHC: 32.9 g/dL (ref 31.5–36.0)
MCV: 82.8 fL (ref 79.5–101.0)
MONO#: 2.1 10*3/uL — AB (ref 0.1–0.9)
MONO%: 2 % (ref 0.0–14.0)
NEUT%: 91.2 % — AB (ref 38.4–76.8)
NEUTROS ABS: 98 10*3/uL — AB (ref 1.5–6.5)
PLATELETS: 114 10*3/uL — AB (ref 145–400)
RBC: 4.07 10*6/uL (ref 3.70–5.45)
RDW: 19 % — ABNORMAL HIGH (ref 11.2–14.5)
RETIC CT ABS: 86.28 10*3/uL (ref 33.70–90.70)
Retic %: 2.12 % — ABNORMAL HIGH (ref 0.70–2.10)
WBC: 107.4 10*3/uL — AB (ref 3.9–10.3)
nRBC: 0 % (ref 0–0)

## 2014-10-26 LAB — URINALYSIS, MICROSCOPIC - CHCC
BILIRUBIN (URINE): NEGATIVE
BLOOD: NEGATIVE
Glucose: NEGATIVE mg/dL
KETONES: NEGATIVE mg/dL
Leukocyte Esterase: NEGATIVE
NITRITE: NEGATIVE
PH: 6 (ref 4.6–8.0)
Protein: NEGATIVE mg/dL
RBC / HPF: NEGATIVE (ref 0–2)
SPECIFIC GRAVITY, URINE: 1.025 (ref 1.003–1.035)
Urobilinogen, UR: 0.2 mg/dL (ref 0.2–1)

## 2014-10-26 LAB — COMPREHENSIVE METABOLIC PANEL (CC13)
ALBUMIN: 3.6 g/dL (ref 3.5–5.0)
ALK PHOS: 79 U/L (ref 40–150)
ALT: 26 U/L (ref 0–55)
AST: 20 U/L (ref 5–34)
Anion Gap: 9 mEq/L (ref 3–11)
BILIRUBIN TOTAL: 0.65 mg/dL (ref 0.20–1.20)
BUN: 13.2 mg/dL (ref 7.0–26.0)
CALCIUM: 9.5 mg/dL (ref 8.4–10.4)
CO2: 21 mEq/L — ABNORMAL LOW (ref 22–29)
Chloride: 112 mEq/L — ABNORMAL HIGH (ref 98–109)
Creatinine: 0.8 mg/dL (ref 0.6–1.1)
EGFR: 70 mL/min/{1.73_m2} — AB (ref >90)
GLUCOSE: 127 mg/dL (ref 70–140)
POTASSIUM: 4.1 meq/L (ref 3.5–5.1)
Sodium: 141 mEq/L (ref 136–145)
TOTAL PROTEIN: 6.4 g/dL (ref 6.4–8.3)

## 2014-10-26 LAB — TECHNOLOGIST REVIEW: Technologist Review: 1

## 2014-10-26 MED ORDER — MIRTAZAPINE 15 MG PO TABS: 15.0000 mg | ORAL_TABLET | Freq: Every day | ORAL | Status: AC

## 2014-10-26 NOTE — Telephone Encounter (Signed)
Gave and printed appt sched and avs for pt for OCT/.///gv barium °

## 2014-10-26 NOTE — Telephone Encounter (Signed)
Received call from Dr. Burr Medico from hematology/oncology.  She is following April [name] for her colon cancer.  She was recently seen and noted to be dehydrated with severe leukocytosis >100K.  She was started on hydrea.  She had an extensive workup previously during her hospitalization and colon resection.  She has been dehydrated, having headaches, eating poorly and feeling depressed.  Dr. Burr Medico has begun her on mirtazapine and is tapering her off zoloft over the next 3 days.  She also gave her vicodin for her headaches and is recommending we treat her with weekly IVFs for her dehydration for the next few weeks.  I spoke with nursing supervisor, Sam, at [facility] and he will arrange this through [place], the independent living clinic nurse.  Pt is to have f/u labs at hematology 10/5.

## 2014-10-26 NOTE — Progress Notes (Signed)
[facility] OFFICE PROGRESS NOTE  CC: Dr. Neldon Mc Dr. Carmela Rima, [facility], DO [address] [place] [address] OTHER MDS: Clayton Lefort  DIAGNOSIS: stage III colon cancer, hypereosinophilic syndrome/MPN  Colon cancer   Staging form: Colon and Rectum, AJCC 7th Edition     Clinical stage from 01/30/2014: Stage IIIB (T3, N1a, M0) - Unsigned     Colon cancer   01/28/2014 Tumor Marker CEA  14.6. Tumor MMR deficient, MSI high   01/30/2014 Initial Diagnosis Colon cancer   01/30/2014 Surgery right colon segmental resection, with clear margins.   01/30/2014 Pathologic Stage PT3N1aM0, stage IIIB, 1 out of 15 lymph nodes was positive.   OTHER ISSUES: 1. 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 patient was then begun on Aromasin 25 mg daily however she was discontinued 9 days later due to the fact that she developed  grade 3 arthralgias and myalgias. 2. Leukocytosis since July 2015, with significant neutrophilia and eosinophilia,likely hypereosinophilic syndrome  3. History of thrombocytopenia when she was young  CURRENT THERAPY: hydrea 562m daily started on 10/11/14, changed to 5043min am, and 100036mm on 10/23/14   HISTORY OF INITIAL PRESENTATION: April April [name] is April [name] [age]ar old Caucasian female, with past medical history of breast DCIS, previously under my colleague Dr. MagVirgie Dadre, and was transferred to me due to her recent diagnosed colon cancer.  She presented with diarrhea for 6 month and acute rectal bleeding and was admitted to hospital on 01/27/2014. She was transfused PRBCs. CT scan suggested bleeding from angiodysplasia. She was admitted for further management. She she underwent colonoscopy and EGD. Colonic mass was noted along with some abnormal duodenal findings. Biopsy of the colon mass showed adenocarcinoma. The patient underwent laproscopic R hemicolectomy on 1/5. April [name] large bleed developed  post-operatively and the patient was first transferred to the ICU and later underwent ex-lap with resection of small bowel anastamosis and new ileocolostomy on 1/6. She was discharged to April [name] rehab for April [name] week and got home about one weeks ago.   She has home PT also now. She is able to walk one mile daily and all does all her ADLs. She has not gone outside yet.  She denies any pain, no nausea, her appetite has been low since surgery, she eats cheese, soup, and small regular meals, she lost 15lbs since the surgery. She has headaches for the past few month ago, and had brain MRI in the hospital which was negative.  She still has intermittent loose BM, 1-2 daily.   She was also noticed to have significant leukocytosis since July 2015, with dominantly elevated neutrophil and eosinophil. She had some cytopenia when she was young, and developed some cyst pia around her colon surgery, now resolved.  INTERIM HISTORY: [facility] for follow-up. She was seen by NP Cyndee and me for worsening headache, fatigue and dysuria. She has mild bleeding on tissue when she urinates in the past few days, no dysuria. She has had chronic diarrhea, 1-2 times since her colon surgery, and got worse since she started cipro 3 days ago. No abdominal pain, or nausea, her appetite has been low lately, and also feels depressed, wants to cry in the morning.  MEDICAL HISTORY: Past Medical History  Diagnosis Date  . History of breast cancer onocologist--  dr magJana Hakim no recurrence    dx 2011-- DCIS, Right breast,  ER/PR positive --- s/p lumpectomy  No radiation,  took [employer] 2012  . Senile osteoporosis     Took Fosamax x15years  . GERD (gastroesophageal reflux disease)   . Sigmoid diverticulosis   . Wears hearing aid     bilateral  . PMB (postmenopausal bleeding)   . Colon cancer oncologist--  dr Truitt Merle    dx jan 2016  --  Ascending colon carcinoma,  Stage IIIB,  pT3 N1a M0,  Grade 2,  MSI,  1 of 15 nodes +//   s/p  right  colectomy  . Hypereosinophilic syndrome   . Chronic diarrhea     ALLERGIES:  is allergic to ativan; codeine; and sulfa antibiotics.  MEDICATIONS:  Current Outpatient Prescriptions  Medication Sig Dispense Refill  . acetaminophen (TYLENOL) 500 MG tablet Take 500 mg by mouth every 6 (six) hours as needed. Taking twice daily for headaches     . Calcium Carbonate-Vitamin D 600-400 MG-UNIT per tablet Take 1 tablet by mouth daily.    . cholecalciferol (VITAMIN D) 1000 UNITS tablet Take 4,000 Units by mouth at bedtime.     . ciprofloxacin (CIPRO) 500 MG tablet Take 1 tablet (500 mg total) by mouth 2 (two) times daily. 10 tablet 0  . colestipol (MICRONIZED COLESTIPOL HCL) 1 G tablet Take 1 g by mouth daily.     . Fe Fum-Fe Poly-Vit C-Lactobac (FUSION PO) Take 1 tablet by mouth daily.    . fexofenadine (ALLEGRA) 30 MG tablet Take 30 mg by mouth every morning.     [name] HYDROcodone-acetaminophen (NORCO/VICODIN) 5-325 MG per tablet Take 1 tablet by mouth every 8 (eight) hours as needed for moderate pain. 20 tablet 0  . hydroxyurea (HYDREA) 500 MG capsule One capsule in am & 2 capsules in the pm 90 capsule 0  . Iron-FA-B Cmp-C-Biot-Probiotic (FUSION PLUS) CAPS TAKE 1 CAPSULE BY MOUTH ONCE DAILY 30 capsule 5  . mirtazapine (REMERON) 15 MG tablet Take 1 tablet (15 mg total) by mouth at bedtime. 30 tablet 2  . Multiple Vitamins-Minerals (CENTRUM SILVER ADULT 50+ PO) Take 1 tablet by mouth daily.    [name] OVER THE COUNTER MEDICATION Take 1 tablet by mouth at bedtime. Macular supplement    . potassium chloride SA (K-DUR,KLOR-CON) 20 MEQ tablet Take 1 tablet (20 mEq total) by mouth daily. 14 tablet 0  . Probiotic Product (ALIGN PO) Take 1 tablet by mouth daily.    . prochlorperazine (COMPAZINE) 10 MG tablet Take 1 tablet (10 mg total) by mouth every 6 (six) hours as needed for nausea or vomiting. 30 tablet 0  . ranitidine (ZANTAC) 150 MG capsule Take 1 capsule by mouth every morning.   0  . sertraline (ZOLOFT) 50  MG tablet Take 1 tablet (50 mg total) by mouth daily. 30 tablet 4  . traMADol (ULTRAM) 50 MG tablet Take 0.5-1 tablets (25-50 mg total) by mouth every 8 (eight) hours as needed. 30 tablet 0   No current facility-administered medications for this visit.    SURGICAL HISTORY:  Past Surgical History  Procedure Laterality Date  . Cataract extraction w/ intraocular lens  implant, bilateral Bilateral [id number]  . Tonsillectomy and adenoidectomy  1930  . Colonoscopy N/April [name] 01/28/2014    Procedure: COLONOSCOPY;  Surgeon: Missy Sabins, MD;  Location: [facility];  Service: Endoscopy;  Laterality: N/April [name];  . Esophagogastroduodenoscopy N/April [name] 01/28/2014    Procedure: ESOPHAGOGASTRODUODENOSCOPY (EGD);  Surgeon: Missy Sabins, MD;  Location: [facility];  Service: Endoscopy;  Laterality: N/April [name];  . Laparoscopic partial right colectomy Right 01/30/2014  Procedure: LAPAROSCOPIC PARTIAL RIGHT COLECTOMY;  Surgeon: Pedro Earls, MD;  Location: [facility];  Service: General;  Laterality: Right;  . Laparotomy N/April [name] 01/31/2014    Procedure: EXPLORATORY LAPAROTOMY, RESECTION OF PRIOR SMALL BOWEL ANASTOMOSIS, UPPER ENDOSCOPY, CREATED April [name]  NEW ILEO COLOSTOMY;  Surgeon: Pedro Earls, MD;  Location: [facility];  Service: General;  Laterality: N/April [name];  . Laparoscopic cholecystectomy  05-26-2009  . Breast lumpectomy Right 12-24-2009  . Transthoracic echocardiogram  04-18-2014    mild LVH/  ef 19-37%/  grade I diastolic dysfunction/  mild LAE/  trivial MR and TR  . Dilatation & currettage/hysteroscopy with resectocope N/April [name] 05/16/2014    Procedure: DILATATION & CURETTAGE/HYSTEROSCOPY WITH RESECTOCOPE;  Surgeon: Arvella Nigh, MD;  Location: [place];  Service: Gynecology;  Laterality: N/April [name];  . Laparoscopy N/April [name] 05/16/2014    Procedure: LAPAROSCOPY DIAGNOSTIC;  Surgeon: Arvella Nigh, MD;  Location: [facility];  Service: Gynecology;  Laterality: N/April [name];   ROS  Constitutional: Denies fevers, chills or abnormal night  sweats, (+) weight loss and fatigue  Eyes: Denies blurriness of vision, double vision or watery eyes Ears, nose, mouth, throat, and face: Denies mucositis or sore throat Respiratory: Denies cough, dyspnea or wheezes Cardiovascular: Denies palpitation, chest discomfort or lower extremity swelling Gastrointestinal: Denies nausea, heartburn, (+) diarrhea Skin: No skin rash  Lymphatics: Denies new lymphadenopathy or easy bruising Neurological:Denies numbness, tingling or new weaknesses, (+) headaches Behavioral/Psych: (+) Depression and anxiety, slightly worse lately   All other systems were reviewed with the patient and are negative.  PHYSICAL EXAMINATION: Filed Vitals:   10/26/14 0847  BP: 109/80  Pulse: 107  Temp: 97.7 F (36.5 C)  Resp: 18   Body mass index is 20.75 kg/(m^2).  ECOG PERFORMANCE STATUS: 2  Sclerae unicteric, pupils equal and reactive Oropharynx clear and moist-- no thrush or other lesions No cervical or supraclavicular adenopathy Lungs no rales or rhonchi Heart regular rate and rhythm Abd soft, surgical scars well healed, nontender, positive bowel sounds, no masses palpated MSK no focal spinal tenderness, no upper extremity lymphedema Neuro: nonfocal, well oriented, appropriate affect Breasts: The right breast is status post lumpectomy. There is no evidence of local recurrence. The right axilla is benign. The left breast is unremarkable. Skin: scatter macular skin rash on the upper front chest, no rashes on the arms, legs or back.  LABORATORY DATA: Outside labs reviewed CBC Latest Ref Rng 10/26/2014 10/22/2014 10/10/2014  WBC 3.9 - 10.3 10e3/uL 107.4(HH) 107.1(HH) 91.0(HH)  Hemoglobin 11.6 - 15.9 g/dL 11.1(L) 10.4(L) 11.3(L)  Hematocrit 34.8 - 46.6 % 33.7(L) 33.5(L) 36.4  Platelets 145 - 400 10e3/uL 114(L) 125(L) 130(L)    CMP Latest Ref Rng 10/26/2014 10/22/2014 03/02/2014  Glucose 70 - 140 mg/dl 127 142(H) 101  BUN 7.0 - 26.0 mg/dL 13.2 12.1 9.5  Creatinine  0.6 - 1.1 mg/dL 0.8 0.8 0.8  Sodium 136 - 145 mEq/L 141 139 143  Potassium 3.5 - 5.1 mEq/L 4.1 3.3(L) 3.9  Chloride 96 - 112 mEq/L - - -  CO2 22 - 29 mEq/L 21(L) 25 24  Calcium 8.4 - 10.4 mg/dL 9.5 9.1 9.0  Total Protein 6.4 - 8.3 g/dL 6.4 6.7 6.7  Total Bilirubin 0.20 - 1.20 mg/dL 0.65 0.68 0.56  Alkaline Phos 40 - 150 U/L 79 88 79  AST 5 - 34 U/L _0 ALT 0 - 55 U/L _1 CEA (Order [id number])      CEA  Status: Finalresult Visible  to patient:  Not Released Nextappt: 11/21/2014 at 01:30 PM in Geriatric Medicine (REED, TIFFANY, DO) Dx:  Colon cancer           Ref Range [id number]  [id number]  [id number]     CEA 0.0 - 5.0 ng/mL 2.6 2.2 14.6 (H)CM          Pathology reports: Colon, segmental resection for tumor, partial right 01/30/2014  1 of 3 FINAL for April April [name], April April [name] (SZ([id number](continued) INVASIVE ADENOCARCINOMA, INVADING THROUGH THE MUSCULARIS PROPRIA INTO PERICOLONIC FATTY TISSUE. ONE OF FIFTEEN LYMPH NODES, POSITIVE FOR METASTATIC ADENOCARCINOMA (1/15). RESECTION MARGINS, NEGATIVE FOR TUMOR. APPENDIX: BENIGN APPENDICEAL TISSUE, NO EVIDENCE OF ACTIVE INFLAMMATION OR NEOPLASM. Specimen: Right colon. Procedure: Segmental resection Tumor site: Ascending colon Specimen integrity: Intact Macroscopic intactness of mesorectum: Not applicable. Macroscopic tumor perforation: N/April [name] Invasive tumor: Maximum size: 5.0 cm Histologic type(s): Invasive adenocarcinoma with extracellular mucin Histologic grade and differentiation: G2: moderately differentiated/low grade Type of polyp in which invasive carcinoma arose: N/April [name] Microscopic extension of invasive tumor: invading through muscularis propria into pericolonic fatty tissue. Lymph-Vascular invasion: Not identified Peri-neural invasion: No Tumor deposit(s) (discontinuous extramural extension): No Resection margins: Negative Proximal margin: 10 cm Distal margin: 9 cm Circumferential (radial)  (posterior ascending, posterior descending; lateral and posterior mid-rectum; and entire lower 1/3 rectum): 3 cm Treatment effect (neo-adjuvant therapy): No Additional polyp(s): N/April [name] Non-neoplastic findings: N/April [name] Lymph nodes: number examined 1; number positive: 15 Pathologic Staging: p T3, N1a, Mx Ancillary studies: MMR stains will be RADIOGRAPHIC STUDIES:  COMPARISON: Multiple priors  ACR Breast Density Category b: There are scattered areas of  fibroglandular density.  FINDINGS:  The patient has had April [name] right lumpectomy. No new mass or suspicious  calcifications are seen in either breast. Compared to priors.  Mammographic images were processed with CAD.  IMPRESSION:  No mammographic evidence of local recurrence, right breast. No  mammographic evidence of malignancy, left breast.  RECOMMENDATION:  Diagnostic mammography in 1 year.  I have discussed the findings and recommendations with the patient.  Results were also provided in writing at the conclusion of the  visit. If applicable, April [name] reminder letter will be sent to the patient  regarding the next appointment.  BI-RADS CATEGORY 1: Negative  Electronically Signed  By: KelDuke SalviaD.  On: 12/02/2012 11:47   ADDITIONAL INFORMATION: Mismatch Repair (MMR) Protein Immunohistochemistry (IHC) IHC Expression Result: MLH1: LOSS OF NUCLEAR EXPRESSION (LESS THAN 5% TUMOR EXPRESSION) MSH2: Preserved nuclear expression (greater 50% tumor expression) MSH6: Preserved nuclear expression (greater 50% tumor expression) PMS2: LOSS OF NUCLEAR EXPRESSION (LESS THAN 5% TUMOR EXPRESSION) * Internal control demonstrates intact nuclear expression Interpretation: ABNORMAL There is loss of the major and minor MMR proteins MLH1 and PMS2. The loss of expression may be secondary to promoter hyper-methylation, gene mutation or other genetic event. BRAF mutation testing and/or MLH1 methylation testing is indicated. The presence of April [name] BRAF mutation and/or  MLH1 hyper-methylation is indicative of April [name] sporadic-type tumor. The absence of either BRAF mutation and/or presence of normal-methylation indicate the possible presence of April [name] hereditary germline mutation (e.g. Lynch syndrome) and referral to genetic counseling is warranted. It is recommended that the loss of protein expression be correlated with molecular based MSI testing.   wil  Bone Marrow, Aspirate,Biopsy, and Clot, right iliac 04/16/2014 - HYPERCELLULAR BONE MARROW FOR AGE WITH GRANULOCYTIC PROLIFERATION INCLUDING NEUTROPHILIA AND EOSINOPHILIA. - MEGAKARYOCYTIC PROLIFERATION. - SEE COMMENT. PERIPHERAL BLOOD: - LEUKOCYTOSIS WITH NEUTROPHILIA, EOSINOPHILIA AND BASOPHILIA. Diagnosis Note It is unclear whether the  morphologic features represent April [name] secondary or April [name] primary clonal process particularly April [name] myeloproliferative myeloproliferative/myelodysplastic process with eosinophilia. Previous FISH studies performed on peripheral blood including PDGFRA, PGDFRB, FGFR1, BCR-ABL, and CBFB are all negative. Selected FISH studies will be repeated on bone marrow material and the results reported under separate cover. There is no increase in blastic cells or evidence of metastatic carcinoma in this material.    ASSESSMENT: 81 y.o. [place] woman  1. Hypereosinophilia syndrome (HES), vs myeloproliferative neoplasm  -Her WBC was normal until June 2015. Her total WBC has been significantly increased since Nov 2015, with predominant neutrophils and eosinophils. Her basophil has been slightly elevated also. Her absolute eosinophils count is in the range 6-8K/ul -Her bone marrow was hypercellular, with granularcytic proliferation including neutrophilia and eosinophilia. The eosinophil count in bone marrow was 14%  -BCR/ABL and JAK2 gene mutation( -). -Both blood and bone marrow FISH testing for PDGFRA, PGDFRB, FGFR1, BCR-ABL, and CBFB are all negative. -Her skin rash biopsy showed perivascular eosinophil  infiltrates, which is supported for hypereosinophil syndrome -Giving the significant elevated eosinophil counts and skin involvement, she meets the criteria for hypereosinophilia syndrome. However reactive eosinophilia is not ruled out. -Her chest CT and echocardiogram were unremarkable.  -Giving the negative PDGFR, she would not benefit from [place]. -Her skin rash responded to steroids very well, but her WBC count has been persistently increasing, despite intermittent steroids therapy, she has been on Hydrea since 2 weeks ago, has not had significant response, I'll increase dose to 1 g in the morning and 1.5 g in the evening. -Her bone marrow biopsy has some features of myeloproliferative neoplasm, given her rapid increase of total white count, I suspect she has myeloproliferative neoplasm. -I'll see her back next week.   2. Mild Diarrhea  -She was recently treated with Cipro for mild UTI, although urine culture was negative. -I would obtain stool for C. difficile to ruled out C. difficile colitis  3. Headaches, fatigue, depression and anorexia   -she will continue vicodin as needed for headaches  -I recommend her to change Zoloft to Remeron to help with her anorexia, I spoke with her family care physician Dr. Mariea Clonts who agrees -Dr. Collene Mares also arrange some IV fluids at her nursing home  4.  Right colon cancer, pT3N1aM0, stage IIIB, G2, MSI high -I reviewed her surgical past findings with her and her husband, daughter in great details.  -I reviewed her staging CT of chest, which is negative for metastasis. -Given her locally advanced stage and elevated preoperative CEA, she has high risks of cancer recurrence, approximately 50%, especially in the first few years. -I discussed that the standard care is adjuvant chemotherapy with FOLFOX for stage III colon cancer. However given her advanced age, limited performance status after surgery, I do not think she is April [name] good candidate for adjuvant  chemotherapy. Her tumor has high microsatellite instability, which is April [name] good prognostic marker, also predicts April [name] poor response to single agent 5-FU.  -After lengthy discussion, we agreed to observe her closely after surgery. I discussed the surveillance plan, with lab and physical exam every 3-4 months, repeat colonoscopy and CT scan in one year. -We also discussed the treatment option if she has cancer recurrence in the future, which includes surgery, systemic therapy especially immunotherapy for metastatic disease. Clinical data has shown to accelerate response (70%) with PD1 antibody to colon cancer with MSI high.  -Her CEA has been normal, we'll continue surveillance -Giving her recent symptom and blood counts  change, I'll obtain CT scan in the next few weeks to rule out recurrence   5. GENETICS -She underwent genetic test, which was negative for Lynch syndrome. 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 -She denied further somatic mutation test (such as punch skin biopsy)  6. History of right breast DCIS, ER/PR positive. -The patient previously couldn't tolerate aromatase inhibitor.  -No evidence of disease recurrence on exam. -Continue observation  -Continue yearly mammogram  Follow-up:  -Increase Hydrea to 1000 mg in the morning and 1500 mg in the evening -I'll see her back next week with repeat lab.  -CT scan in the next few weeks.   I spent 30 minutes, more than 50% time face to face counseling for her visit.  Truitt Merle, MD

## 2014-10-27 LAB — URINE CULTURE

## 2014-10-28 LAB — CULTURE, BLOOD (SINGLE)

## 2014-10-29 ENCOUNTER — Other Ambulatory Visit: Payer: Self-pay | Admitting: *Deleted

## 2014-10-29 ENCOUNTER — Other Ambulatory Visit (HOSPITAL_COMMUNITY)
Admission: RE | Admit: 2014-10-29 | Discharge: 2014-10-29 | Disposition: A | Discharge status: Home / Self Care | Payer: Medicare Other | Source: Ambulatory Visit | Attending: Hematology | Admitting: Hematology

## 2014-10-29 DIAGNOSIS — C182 Malignant neoplasm of ascending colon: Secondary | ICD-10-CM | POA: Insufficient documentation

## 2014-10-29 DIAGNOSIS — C189 Malignant neoplasm of colon, unspecified: Secondary | ICD-10-CM

## 2014-10-29 LAB — C DIFFICILE QUICK SCREEN W PCR REFLEX
C DIFFICILE (CDIFF) INTERP: NEGATIVE
C DIFFICILE (CDIFF) TOXIN: NEGATIVE
C Diff antigen: NEGATIVE

## 2014-10-31 ENCOUNTER — Telehealth: Payer: Self-pay | Admitting: *Deleted

## 2014-10-31 ENCOUNTER — Ambulatory Visit (HOSPITAL_BASED_OUTPATIENT_CLINIC_OR_DEPARTMENT_OTHER): Payer: Medicare Other | Admitting: Hematology

## 2014-10-31 ENCOUNTER — Other Ambulatory Visit (HOSPITAL_BASED_OUTPATIENT_CLINIC_OR_DEPARTMENT_OTHER): Payer: Medicare Other

## 2014-10-31 ENCOUNTER — Encounter: Payer: Self-pay | Admitting: Hematology

## 2014-10-31 VITALS — BP 109/53 | HR 107 | Temp 98.4°F | Resp 16 | Ht 65.0 in | Wt 121.5 lb

## 2014-10-31 DIAGNOSIS — C189 Malignant neoplasm of colon, unspecified: Secondary | ICD-10-CM

## 2014-10-31 DIAGNOSIS — R197 Diarrhea, unspecified: Secondary | ICD-10-CM | POA: Diagnosis not present

## 2014-10-31 DIAGNOSIS — R51 Headache: Secondary | ICD-10-CM | POA: Diagnosis not present

## 2014-10-31 DIAGNOSIS — D721 Eosinophilia: Secondary | ICD-10-CM | POA: Diagnosis not present

## 2014-10-31 DIAGNOSIS — D72119 Hypereosinophilic syndrome (hes), unspecified: Secondary | ICD-10-CM

## 2014-10-31 DIAGNOSIS — C182 Malignant neoplasm of ascending colon: Secondary | ICD-10-CM

## 2014-10-31 DIAGNOSIS — Z853 Personal history of malignant neoplasm of breast: Secondary | ICD-10-CM

## 2014-10-31 LAB — CBC WITH DIFFERENTIAL/PLATELET
BASO%: 0.3 % (ref 0.0–2.0)
Basophils Absolute: 0.1 10*3/uL (ref 0.0–0.1)
EOS%: 1.7 % (ref 0.0–7.0)
Eosinophils Absolute: 0.8 10*3/uL — ABNORMAL HIGH (ref 0.0–0.5)
HCT: 32.6 % — ABNORMAL LOW (ref 34.8–46.6)
HGB: 10.8 g/dL — ABNORMAL LOW (ref 11.6–15.9)
LYMPH%: 7.5 % — AB (ref 14.0–49.7)
MCH: 27.3 pg (ref 25.1–34.0)
MCHC: 33.1 g/dL (ref 31.5–36.0)
MCV: 82.3 fL (ref 79.5–101.0)
MONO#: 0.4 10*3/uL (ref 0.1–0.9)
MONO%: 0.9 % (ref 0.0–14.0)
NEUT%: 89.6 % — AB (ref 38.4–76.8)
NEUTROS ABS: 40.1 10*3/uL — AB (ref 1.5–6.5)
Platelets: 76 10*3/uL — ABNORMAL LOW (ref 145–400)
RBC: 3.96 10*6/uL (ref 3.70–5.45)
RDW: 18.9 % — ABNORMAL HIGH (ref 11.2–14.5)
WBC: 44.8 10*3/uL — AB (ref 3.9–10.3)
lymph#: 3.4 10*3/uL — ABNORMAL HIGH (ref 0.9–3.3)
nRBC: 0 % (ref 0–0)

## 2014-10-31 LAB — COMPREHENSIVE METABOLIC PANEL (CC13)
ALT: 17 U/L (ref 0–55)
ANION GAP: 7 meq/L (ref 3–11)
AST: 14 U/L (ref 5–34)
Albumin: 3.8 g/dL (ref 3.5–5.0)
Alkaline Phosphatase: 64 U/L (ref 40–150)
BUN: 20.1 mg/dL (ref 7.0–26.0)
CALCIUM: 9.4 mg/dL (ref 8.4–10.4)
CHLORIDE: 107 meq/L (ref 98–109)
CO2: 25 meq/L (ref 22–29)
CREATININE: 0.8 mg/dL (ref 0.6–1.1)
EGFR: 65 mL/min/{1.73_m2} — ABNORMAL LOW (ref >90)
Glucose: 132 mg/dl (ref 70–140)
POTASSIUM: 4 meq/L (ref 3.5–5.1)
Sodium: 140 mEq/L (ref 136–145)
Total Bilirubin: 0.68 mg/dL (ref 0.20–1.20)
Total Protein: 6.4 g/dL (ref 6.4–8.3)

## 2014-10-31 LAB — TECHNOLOGIST REVIEW

## 2014-10-31 NOTE — Telephone Encounter (Signed)
Left message with Triage Nurse @ Dr. Mariea Clonts.  Patient is getting IVF 500cc over 5-6 hours.  Could they please shorten that to 2-3 hours.  IVF have been very helpful and Dr. Burr Medico is requesting that they continue them for 3-4 more treatments.  Left phone number for Triage to call back if any questions.

## 2014-10-31 NOTE — Telephone Encounter (Signed)
Patient is getting IVF 500cc over 5-6 hours. Could they please shorten that to 2-3 hours. IVF have been very helpful and Dr. Burr Medico is requesting that they continue them for 3-4 more treatments. Sent message for Dr. Mariea Clonts to please advise.

## 2014-10-31 NOTE — Progress Notes (Signed)
[place] OFFICE PROGRESS NOTE  CC: Dr. Neldon Mc Dr. Carmela Rima, [facility], DO [address]  [place] [address] OTHER MDS: Clayton Lefort  DIAGNOSIS: stage III colon cancer, hypereosinophilic syndrome/MPN  Colon cancer   Staging form: Colon and Rectum, AJCC 7th Edition     Clinical stage from 01/30/2014: Stage IIIB (T3, N1a, M0) - Unsigned     Colon cancer ([place])   01/28/2014 Tumor Marker CEA  14.6. Tumor MMR deficient, MSI high   01/30/2014 Initial Diagnosis Colon cancer   01/30/2014 Surgery right colon segmental resection, with clear margins.   01/30/2014 Pathologic Stage PT3N1aM0, stage IIIB, 1 out of 15 lymph nodes was positive.    Hypereosinophilic syndrome   07/02/3014 Bone Marrow Biopsy her bone marrow showed hypercellular with  granulocytic poor proliferation including neutrophilia and eosinophil, megakaryocyte proliferation. Fish was negative for PDGFRA, PDGFRB, FGFR1, BCR-ABLand CBFB, unclear if primary or secondary MPN/MDS process.    04/16/2014 Initial Diagnosis Hypereosinophilic syndrome   0/10/9321 -  Chemotherapy his white count went up to 100,000, I started her on Hydrea, dose titrated to 1 g twice daily   OTHER ISSUES: 1. 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 patient was then begun on Aromasin 25 mg daily however she was discontinued 9 days later due to the fact that she developed  grade 3 arthralgias and myalgias. 2. Leukocytosis since July 2015, with significant neutrophilia and eosinophilia,likely hypereosinophilic syndrome  3. History of thrombocytopenia when she was young  CURRENT THERAPY: hydrea 526m daily started on 10/11/14, changed to 5063min am, and 100084mm on 10/23/14   HISTORY OF INITIAL PRESENTATION: Mrs. Raynes is a 88 50ar old Caucasian female, with past medical history of breast DCIS, previously under my colleague Dr. MagVirgie Dadre, and was transferred to me due to  her recent diagnosed colon cancer.  She presented with diarrhea for 6 month and acute rectal bleeding and was admitted to hospital on 01/27/2014. She was transfused PRBCs. CT scan suggested bleeding from angiodysplasia. She was admitted for further management. She she underwent colonoscopy and EGD. Colonic mass was noted along with some abnormal duodenal findings. Biopsy of the colon mass showed adenocarcinoma. The patient underwent laproscopic R hemicolectomy on 1/5. A large bleed developed post-operatively and the patient was first transferred to the ICU and later underwent ex-lap with resection of small bowel anastamosis and new ileocolostomy on 1/6. She was discharged to a rehab for a week and got home about one weeks ago.   She has home PT also now. She is able to walk one mile daily and all does all her ADLs. She has not gone outside yet.  She denies any pain, no nausea, her appetite has been low since surgery, she eats cheese, soup, and small regular meals, she lost 15lbs since the surgery. She has headaches for the past few month ago, and had brain MRI in the hospital which was negative.  She still has intermittent loose BM, 1-2 daily.   She was also noticed to have significant leukocytosis since July 2015, with dominantly elevated neutrophil and eosinophil. She had some cytopenia when she was young, and developed some cyst pia around her colon surgery, now resolved.  INTERIM HISTORY: Fra[facility] for follow-up.  She is tolerating Hydrea well.  Her appetite has improved, she has started eating more lately. She still has moderate and the persistent headaches, 6-8/10,  And she takes Norco every 8 hours  as needed, which helps.  She has tapered off Zoloft, and start his mirtazapine last night. She sleeps well. Her diarrhea has improved. She still has moderate fatigue, in a few anxious and hopefully some time.  She lost about 3 pounds in the past week.  MEDICAL HISTORY: Past Medical History    Diagnosis Date  . History of breast cancer onocologist--  dr Jana Hakim--  no recurrence    dx 2011-- DCIS, Right breast,  ER/PR positive --- s/p lumpectomy  No radiation, took Aromasen 2012  . Senile osteoporosis     Took Fosamax x15years  . GERD (gastroesophageal reflux disease)   . Sigmoid diverticulosis   . Wears hearing aid     bilateral  . PMB (postmenopausal bleeding)   . Colon cancer [facility]) oncologist--  dr Truitt Merle    dx jan 2016  --  Ascending colon carcinoma,  Stage IIIB,  pT3 N1a M0,  Grade 2,  MSI,  1 of 15 nodes +//   s/p  right colectomy  . Hypereosinophilic syndrome   . Chronic diarrhea     ALLERGIES:  is allergic to ativan; codeine; and sulfa antibiotics.  MEDICATIONS:  Current Outpatient Prescriptions  Medication Sig Dispense Refill  . Calcium Carbonate-Vitamin D 600-400 MG-UNIT per tablet Take 1 tablet by mouth daily.    . cholecalciferol (VITAMIN D) 1000 UNITS tablet Take 4,000 Units by mouth at bedtime.     . colestipol (MICRONIZED COLESTIPOL HCL) 1 G tablet Take 1 g by mouth daily.     . Fe Fum-Fe Poly-Vit C-Lactobac (FUSION PO) Take 1 tablet by mouth daily.    [name] HYDROcodone-acetaminophen (NORCO/VICODIN) 5-325 MG per tablet Take 1 tablet by mouth every 8 (eight) hours as needed for moderate pain. 20 tablet 0  . hydroxyurea (HYDREA) 500 MG capsule One capsule in am & 2 capsules in the pm (Patient taking differently: Two capsule in am & 3 capsules in the pm) 90 capsule 0  . mirtazapine (REMERON) 15 MG tablet Take 1 tablet (15 mg total) by mouth at bedtime. 30 tablet 2  . OVER THE COUNTER MEDICATION Take 1 tablet by mouth at bedtime. Macular supplement    . potassium chloride SA (K-DUR,KLOR-CON) 20 MEQ tablet Take 1 tablet (20 mEq total) by mouth daily. 14 tablet 0  . acetaminophen (TYLENOL) 500 MG tablet Take 500 mg by mouth every 6 (six) hours as needed. Taking twice daily for headaches     . prochlorperazine (COMPAZINE) 10 MG tablet Take 1 tablet (10 mg total) by  mouth every 6 (six) hours as needed for nausea or vomiting. (Patient not taking: Reported on 10/31/2014) 30 tablet 0   No current facility-administered medications for this visit.    SURGICAL HISTORY:  Past Surgical History  Procedure Laterality Date  . Cataract extraction w/ intraocular lens  implant, bilateral Bilateral 0277,4128  . Tonsillectomy and adenoidectomy  1930  . Colonoscopy N/A 01/28/2014    Procedure: COLONOSCOPY;  Surgeon: Missy Sabins, MD;  Location: [facility] END[facility];  Service: Endoscopy;  Laterality: N/A;  . Esophagogastroduodenoscopy N/A 01/28/2014    Procedure: ESOPHAGOGASTRODUODENOSCOPY (EGD);  Surgeon: Missy Sabins, MD;  Location: [facility];  Service: Endoscopy;  Laterality: N/A;  . Laparoscopic partial right colectomy Right 01/30/2014    Procedure: LAPAROSCOPIC PARTIAL RIGHT COLECTOMY;  Surgeon: Pedro Earls, MD;  Location: [facility] ORS;  Service: General;  Laterality: Right;  . Laparotomy N/A 01/31/2014    Procedure: EXPLORATORY LAPAROTOMY, RESECTION OF PRIOR SMALL BOWEL ANASTOMOSIS, UPPER ENDOSCOPY,  CREATED A  NEW ILEO COLOSTOMY;  Surgeon: Pedro Earls, MD;  Location: [facility];  Service: General;  Laterality: N/A;  . Laparoscopic cholecystectomy  05-26-2009  . Breast lumpectomy Right 12-24-2009  . Transthoracic echocardiogram  04-18-2014    mild LVH/  ef 06-26%/  grade I diastolic dysfunction/  mild LAE/  trivial MR and TR  . Dilatation & currettage/hysteroscopy with resectocope N/A 05/16/2014    Procedure: DILATATION & CURETTAGE/HYSTEROSCOPY WITH RESECTOCOPE;  Surgeon: Arvella Nigh, MD;  Location: [place];  Service: Gynecology;  Laterality: N/A;  . Laparoscopy N/A 05/16/2014    Procedure: LAPAROSCOPY DIAGNOSTIC;  Surgeon: Arvella Nigh, MD;  Location: [facility];  Service: Gynecology;  Laterality: N/A;   ROS  Constitutional: Denies fevers, chills or abnormal night sweats, (+) weight loss and fatigue  Eyes: Denies blurriness of vision, double  vision or watery eyes Ears, nose, mouth, throat, and face: Denies mucositis or sore throat Respiratory: Denies cough, dyspnea or wheezes Cardiovascular: Denies palpitation, chest discomfort or lower extremity swelling Gastrointestinal: Denies nausea, heartburn, (+) diarrhea Skin: No skin rash  Lymphatics: Denies new lymphadenopathy or easy bruising Neurological:Denies numbness, tingling or new weaknesses, (+) headaches Behavioral/Psych: (+) Depression and anxiety, slightly worse lately   All other systems were reviewed with the patient and are negative.  PHYSICAL EXAMINATION: Filed Vitals:   10/31/14 1329  BP: 109/53  Pulse: 107  Temp: 98.4 F (36.9 C)  Resp: 16   Body mass index is 20.22 kg/(m^2).  ECOG PERFORMANCE STATUS: 2  Sclerae unicteric, pupils equal and reactive Oropharynx clear and moist-- no thrush or other lesions No cervical or supraclavicular adenopathy Lungs no rales or rhonchi Heart regular rate and rhythm Abd soft, surgical scars well healed, nontender, positive bowel sounds, no masses palpated MSK no focal spinal tenderness, no upper extremity lymphedema Neuro: nonfocal, well oriented, appropriate affect Breasts: The right breast is status post lumpectomy. There is no evidence of local recurrence. The right axilla is benign. The left breast is unremarkable. Skin: scatter macular skin rash on the upper front chest, no rashes on the arms, legs or back.  LABORATORY DATA: Outside labs reviewed CBC Latest Ref Rng 10/31/2014 10/26/2014 10/22/2014  WBC 3.9 - 10.3 10e3/uL 44.8(H) 107.4(HH) 107.1(HH)  Hemoglobin 11.6 - 15.9 g/dL 10.8(L) 11.1(L) 10.4(L)  Hematocrit 34.8 - 46.6 % 32.6(L) 33.7(L) 33.5(L)  Platelets 145 - 400 10e3/uL 76 Large & giant platelets(L) 114(L) 125(L)    CMP Latest Ref Rng 10/31/2014 10/26/2014 10/22/2014  Glucose 70 - 140 mg/dl 132 127 142(H)  BUN 7.0 - 26.0 mg/dL 20.1 13.2 12.1  Creatinine 0.6 - 1.1 mg/dL 0.8 0.8 0.8  Sodium 136 - 145 mEq/L  140 141 139  Potassium 3.5 - 5.1 mEq/L 4.0 4.1 3.3(L)  Chloride 96 - 112 mEq/L - - -  CO2 22 - 29 mEq/L 25 21(L) 25  Calcium 8.4 - 10.4 mg/dL 9.4 9.5 9.1  Total Protein 6.4 - 8.3 g/dL 6.4 6.4 6.7  Total Bilirubin 0.20 - 1.20 mg/dL 0.68 0.65 0.68  Alkaline Phos 40 - 150 U/L 64 79 88  AST 5 - 34 U/L _0 ALT 0 - 55 U/L _1 CEA (Order [id number])      CEA  Status: Finalresult Visible to patient:  Not Released Nextappt: 11/21/2014 at 01:30 PM in Geriatric Medicine (REED, TIFFANY, DO) Dx:  Colon cancer           Ref Range 72moago  522mo  ago  62moago     CEA 0.0 - 5.0 ng/mL 2.6 2.2 14.6 (H)CM          Pathology reports: Colon, segmental resection for tumor, partial right 01/30/2014  1 of 3 FINAL for Legacy, Jacquelyne A (([id number] Diagnosis(continued) INVASIVE ADENOCARCINOMA, INVADING THROUGH THE MUSCULARIS PROPRIA INTO PERICOLONIC FATTY TISSUE. ONE OF FIFTEEN LYMPH NODES, POSITIVE FOR METASTATIC ADENOCARCINOMA (1/15). RESECTION MARGINS, NEGATIVE FOR TUMOR. APPENDIX: BENIGN APPENDICEAL TISSUE, NO EVIDENCE OF ACTIVE INFLAMMATION OR NEOPLASM. Specimen: Right colon. Procedure: Segmental resection Tumor site: Ascending colon Specimen integrity: Intact Macroscopic intactness of mesorectum: Not applicable. Macroscopic tumor perforation: N/A Invasive tumor: Maximum size: 5.0 cm Histologic type(s): Invasive adenocarcinoma with extracellular mucin Histologic grade and differentiation: G2: moderately differentiated/low grade Type of polyp in which invasive carcinoma arose: N/A Microscopic extension of invasive tumor: invading through muscularis propria into pericolonic fatty tissue. Lymph-Vascular invasion: Not identified Peri-neural invasion: No Tumor deposit(s) (discontinuous extramural extension): No Resection margins: Negative Proximal margin: 10 cm Distal margin: 9 cm Circumferential (radial) (posterior ascending, posterior descending;  lateral and posterior mid-rectum; and entire lower 1/3 rectum): 3 cm Treatment effect (neo-adjuvant therapy): No Additional polyp(s): N/A Non-neoplastic findings: N/A Lymph nodes: number examined 1; number positive: 15 Pathologic Staging: p T3, N1a, Mx Ancillary studies: MMR stains will be RADIOGRAPHIC STUDIES:  COMPARISON: Multiple priors  ACR Breast Density Category b: There are scattered areas of  fibroglandular density.  FINDINGS:  The patient has had a right lumpectomy. No new mass or suspicious  calcifications are seen in either breast. Compared to priors.  Mammographic images were processed with CAD.  IMPRESSION:  No mammographic evidence of local recurrence, right breast. No  mammographic evidence of malignancy, left breast.  RECOMMENDATION:  Diagnostic mammography in 1 year.  I have discussed the findings and recommendations with the patient.  Results were also provided in writing at the conclusion of the  visit. If applicable, a reminder letter will be sent to the patient  regarding the next appointment.  BI-RADS CATEGORY 1: Negative  Electronically Signed  By: KDuke SalviaM.D.  On: 12/02/2012 11:47   ADDITIONAL INFORMATION: Mismatch Repair (MMR) Protein Immunohistochemistry (IHC) IHC Expression Result: MLH1: LOSS OF NUCLEAR EXPRESSION (LESS THAN 5% TUMOR EXPRESSION) MSH2: Preserved nuclear expression (greater 50% tumor expression) MSH6: Preserved nuclear expression (greater 50% tumor expression) PMS2: LOSS OF NUCLEAR EXPRESSION (LESS THAN 5% TUMOR EXPRESSION) * Internal control demonstrates intact nuclear expression Interpretation: ABNORMAL There is loss of the major and minor MMR proteins MLH1 and PMS2. The loss of expression may be secondary to promoter hyper-methylation, gene mutation or other genetic event. BRAF mutation testing and/or MLH1 methylation testing is indicated. The presence of a BRAF mutation and/or MLH1 hyper-methylation is indicative of a  sporadic-type tumor. The absence of either BRAF mutation and/or presence of normal-methylation indicate the possible presence of a hereditary germline mutation (e.g. Lynch syndrome) and referral to genetic counseling is warranted. It is recommended that the loss of protein expression be correlated with molecular based MSI testing.   wil  Bone Marrow, Aspirate,Biopsy, and Clot, right iliac 04/16/2014 - HYPERCELLULAR BONE MARROW FOR AGE WITH GRANULOCYTIC PROLIFERATION INCLUDING NEUTROPHILIA AND EOSINOPHILIA. - MEGAKARYOCYTIC PROLIFERATION. - SEE COMMENT. PERIPHERAL BLOOD: - LEUKOCYTOSIS WITH NEUTROPHILIA, EOSINOPHILIA AND BASOPHILIA. Diagnosis Note It is unclear whether the morphologic features represent a secondary or a primary clonal process particularly a myeloproliferative myeloproliferative/myelodysplastic process with eosinophilia. Previous FISH studies performed on peripheral blood including PDGFRA, PGDFRB, FGFR1, BCR-ABL, and CBFB are all negative. Selected FISH  studies will be repeated on bone marrow material and the results reported under separate cover. There is no increase in blastic cells or evidence of metastatic carcinoma in this material.    ASSESSMENT: 81 y.o. [place] woman  1. Hypereosinophilia syndrome (HES), vs myeloproliferative neoplasm  -Her WBC was normal until June 2015. Her total WBC has been significantly increased since Nov 2015, with predominant neutrophils and eosinophils. Her basophil has been slightly elevated also. Her absolute eosinophils count is in the range 6-8K/ul -Her bone marrow was hypercellular, with granularcytic proliferation including neutrophilia and eosinophilia. The eosinophil count in bone marrow was 14%  -BCR/ABL and JAK2 gene mutation( -). -Both blood and bone marrow FISH testing for PDGFRA, PGDFRB, FGFR1, BCR-ABL, and CBFB are all negative. -Her skin rash biopsy showed perivascular eosinophil infiltrates, which is supported for  hypereosinophil syndrome -Giving the significant elevated eosinophil counts and skin involvement, she meets the criteria for hypereosinophilia syndrome. However reactive eosinophilia is not ruled out. -Her chest CT and echocardiogram were unremarkable.  -Giving the negative PDGFR, she would not benefit from [place]. -Her skin rash responded to steroids very well, but her WBC count has been persistently increasing, despite intermittent steroids therapy. -Her bone marrow biopsy has some features of myeloproliferative neoplasm, given her rapid increase of total white count, I suspect she has myeloproliferative neoplasm. - I have gradually increased her Hydrea dose, her  WBC came down to 44.8K  Today,  Henrene Pastor, also dropped to 76K,  I'll decrease his Hydrea dose to 1 g twice daily -I'll see her back next week.   2. Mild Diarrhea  -She was recently treated with Cipro for mild UTI, although urine culture was negative. - her stool was negative for C. Difficile - her diarrhea has improved.  3. Headaches, fatigue, depression and anorexia   -she will continue vicodin as needed for headaches  - she has switched from Zoloft to mirtazapine - she felt much better, less anxious , when I told her white counts has improved today. - I encouraged her to eat moore, and as physically active as she can,  I encouraged her to exercise.  4.  Right colon cancer, pT3N1aM0, stage IIIB, G2, MSI high -I reviewed her surgical past findings with her and her husband, daughter in great details.  -I reviewed her staging CT of chest, which is negative for metastasis. -Given her locally advanced stage and elevated preoperative CEA, she has high risks of cancer recurrence, approximately 50%, especially in the first few years. -I discussed that the standard care is adjuvant chemotherapy with FOLFOX for stage III colon cancer. However given her advanced age, limited performance status after surgery, I do not think she is a good  candidate for adjuvant chemotherapy. Her tumor has high microsatellite instability, which is a good prognostic marker, also predicts a poor response to single agent 5-FU.  -After lengthy discussion, we agreed to observe her closely after surgery. I discussed the surveillance plan, with lab and physical exam every 3-4 months, repeat colonoscopy and CT scan in one year. -We also discussed the treatment option if she has cancer recurrence in the future, which includes surgery, systemic therapy especially immunotherapy for metastatic disease. Clinical data has shown to accelerate response (70%) with PD1 antibody to colon cancer with MSI high.  -Her CEA has been normal, we'll continue surveillance -Giving her recent symptom and blood counts change, I'll obtain CT scan in the next few weeks to rule out recurrence.  She did not tolerate oral contrast  well, we'll use IV contrast alone this time.  5. GENETICS -She underwent genetic test, which was negative for Lynch syndrome. 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 -She denied further somatic mutation test (such as punch skin biopsy)  6. History of right breast DCIS, ER/PR positive. -The patient previously couldn't tolerate aromatase inhibitor.  -No evidence of disease recurrence on exam. -Continue observation  -Continue yearly mammogram  Follow-up:  -decrease Hydrea to 1000 mg twice daily -I'll see her back next week with repeat lab.  -CT scan in the next few weeks.   I spent 30 minutes, more than 50% time face to face counseling for her visit.  Truitt Merle, MD

## 2014-11-01 LAB — CEA: CEA: 0.8 ng/mL (ref 0.0–5.0)

## 2014-11-01 NOTE — Telephone Encounter (Signed)
Dr. Mariea Clonts, return my message and she will talk to the RN about her fluid intake.

## 2014-11-01 NOTE — Telephone Encounter (Deleted)
Tiffany Lynelle Doctor, DO  Eilene Ghazi, RMA           Yes, I will ask the RN here to give the fluid over 3 hours. I just don't want to overload her.        Previous Messages

## 2014-11-02 ENCOUNTER — Ambulatory Visit (HOSPITAL_COMMUNITY): Payer: Medicare Other

## 2014-11-05 ENCOUNTER — Other Ambulatory Visit: Payer: Self-pay | Admitting: *Deleted

## 2014-11-05 DIAGNOSIS — D721 Eosinophilia: Principal | ICD-10-CM

## 2014-11-05 DIAGNOSIS — D72119 Hypereosinophilic syndrome (hes), unspecified: Secondary | ICD-10-CM

## 2014-11-05 MED ORDER — HYDROCODONE-ACETAMINOPHEN 5-325 MG PO TABS: 1.0000 | ORAL_TABLET | Freq: Four times a day (QID) | ORAL | Status: DC | PRN

## 2014-11-07 ENCOUNTER — Encounter: Payer: Self-pay | Admitting: Skilled Nursing Facility1

## 2014-11-07 ENCOUNTER — Ambulatory Visit (HOSPITAL_BASED_OUTPATIENT_CLINIC_OR_DEPARTMENT_OTHER): Payer: Medicare Other | Admitting: Hematology

## 2014-11-07 ENCOUNTER — Telehealth: Payer: Self-pay | Admitting: Hematology

## 2014-11-07 ENCOUNTER — Other Ambulatory Visit (HOSPITAL_BASED_OUTPATIENT_CLINIC_OR_DEPARTMENT_OTHER): Payer: Medicare Other

## 2014-11-07 ENCOUNTER — Encounter: Payer: Self-pay | Admitting: Hematology

## 2014-11-07 VITALS — BP 102/44 | HR 98 | Temp 98.2°F | Resp 18 | Ht 65.0 in | Wt 124.3 lb

## 2014-11-07 DIAGNOSIS — F329 Major depressive disorder, single episode, unspecified: Secondary | ICD-10-CM

## 2014-11-07 DIAGNOSIS — C182 Malignant neoplasm of ascending colon: Secondary | ICD-10-CM | POA: Diagnosis present

## 2014-11-07 DIAGNOSIS — R51 Headache: Secondary | ICD-10-CM

## 2014-11-07 DIAGNOSIS — Z853 Personal history of malignant neoplasm of breast: Secondary | ICD-10-CM | POA: Diagnosis not present

## 2014-11-07 DIAGNOSIS — D471 Chronic myeloproliferative disease: Secondary | ICD-10-CM | POA: Diagnosis not present

## 2014-11-07 DIAGNOSIS — D721 Eosinophilia: Secondary | ICD-10-CM | POA: Diagnosis not present

## 2014-11-07 DIAGNOSIS — D696 Thrombocytopenia, unspecified: Secondary | ICD-10-CM | POA: Diagnosis not present

## 2014-11-07 DIAGNOSIS — R630 Anorexia: Secondary | ICD-10-CM | POA: Diagnosis not present

## 2014-11-07 DIAGNOSIS — D72119 Hypereosinophilic syndrome (hes), unspecified: Secondary | ICD-10-CM

## 2014-11-07 DIAGNOSIS — D649 Anemia, unspecified: Secondary | ICD-10-CM | POA: Diagnosis not present

## 2014-11-07 DIAGNOSIS — R5383 Other fatigue: Secondary | ICD-10-CM | POA: Diagnosis not present

## 2014-11-07 LAB — CBC WITH DIFFERENTIAL/PLATELET
BASO%: 0.2 % (ref 0.0–2.0)
Basophils Absolute: 0 10*3/uL (ref 0.0–0.1)
EOS ABS: 0.1 10*3/uL (ref 0.0–0.5)
EOS%: 0.9 % (ref 0.0–7.0)
HEMATOCRIT: 27.2 % — AB (ref 34.8–46.6)
HGB: 8.8 g/dL — ABNORMAL LOW (ref 11.6–15.9)
LYMPH%: 13 % — AB (ref 14.0–49.7)
MCH: 27.2 pg (ref 25.1–34.0)
MCHC: 32.4 g/dL (ref 31.5–36.0)
MCV: 84 fL (ref 79.5–101.0)
MONO#: 0.7 10*3/uL (ref 0.1–0.9)
MONO%: 5.2 % (ref 0.0–14.0)
NEUT#: 11.3 10*3/uL — ABNORMAL HIGH (ref 1.5–6.5)
NEUT%: 80.7 % — AB (ref 38.4–76.8)
PLATELETS: 37 10*3/uL — AB (ref 145–400)
RBC: 3.24 10*6/uL — ABNORMAL LOW (ref 3.70–5.45)
RDW: 20 % — ABNORMAL HIGH (ref 11.2–14.5)
WBC: 14 10*3/uL — ABNORMAL HIGH (ref 3.9–10.3)
lymph#: 1.8 10*3/uL (ref 0.9–3.3)
nRBC: 0 % (ref 0–0)

## 2014-11-07 LAB — TECHNOLOGIST REVIEW

## 2014-11-07 NOTE — Telephone Encounter (Signed)
Gave and printed appt sched and avs fo rpt for OCT °

## 2014-11-07 NOTE — Progress Notes (Signed)
[facility] OFFICE PROGRESS NOTE  CC: Dr. Neldon Mc Dr. Carmela Rima, [facility], DO [address] [place]  [address] OTHER MDS: Clayton Lefort  DIAGNOSIS: stage III colon cancer, hypereosinophilic syndrome/MPN  Colon cancer   Staging form: Colon and Rectum, AJCC 7th Edition     Clinical stage from 01/30/2014: Stage IIIB (T3, N1a, M0) - Unsigned     Colon cancer ([place])   01/28/2014 Tumor Marker CEA  14.6. Tumor MMR deficient, MSI high   01/30/2014 Initial Diagnosis Colon cancer   01/30/2014 Surgery right colon segmental resection, with clear margins.   01/30/2014 Pathologic Stage [id number], stage IIIB, 1 out of 15 lymph nodes was positive.    Hypereosinophilic syndrome   8/33/8250 Bone Marrow Biopsy her bone marrow showed hypercellular with  granulocytic poor proliferation including neutrophilia and eosinophil, megakaryocyte proliferation. Fish was negative for PDGFRA, PDGFRB, FGFR1, BCR-ABLand CBFB, unclear if primary or secondary MPN/MDS process.    04/16/2014 Initial Diagnosis Hypereosinophilic syndrome   5/39/7673 -  Chemotherapy his white count went up to 100,000, I started her on Hydrea, dose titrated to 1 g twice daily   OTHER ISSUES: 1. 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 patient was then begun on Aromasin 25 mg daily however she was discontinued 9 days later due to the fact that she developed  grade 3 arthralgias and myalgias. 2. Leukocytosis since July 2015, with significant neutrophilia and eosinophilia,likely hypereosinophilic syndrome  3. History of thrombocytopenia when she was young  HISTORY OF INITIAL PRESENTATION: Mrs. Bojanowski is a 81 year old Caucasian female, with past medical history of breast DCIS, previously under my colleague Dr. Virgie Dad care, and was transferred to me due to her recent diagnosed colon cancer.  She presented with diarrhea for 6 month and acute rectal bleeding and  was admitted to hospital on 01/27/2014. She was transfused PRBCs. CT scan suggested bleeding from angiodysplasia. She was admitted for further management. She she underwent colonoscopy and EGD. Colonic mass was noted along with some abnormal duodenal findings. Biopsy of the colon mass showed adenocarcinoma. The patient underwent laproscopic R hemicolectomy on 1/5. A large bleed developed post-operatively and the patient was first transferred to the ICU and later underwent ex-lap with resection of small bowel anastamosis and new ileocolostomy on 1/6. She was discharged to a rehab for a week and got home about one weeks ago.   She has home PT also now. She is able to walk one mile daily and all does all her ADLs. She has not gone outside yet.  She denies any pain, no nausea, her appetite has been low since surgery, she eats cheese, soup, and small regular meals, she lost 15lbs since the surgery. She has headaches for the past few month ago, and had brain MRI in the hospital which was negative.  She still has intermittent loose BM, 1-2 daily.   She was also noticed to have significant leukocytosis since July 2015, with dominantly elevated neutrophil and eosinophil. She had some cytopenia when she was young, and developed some cyst pia around her colon surgery, now resolved.  CURRENT THERAPY: hydrea 55m daily started on 10/11/14, changed to 5037min am, and 100070mm on 10/23/14, increased to 1000m50m AM, 1500mg91mpm on 10/26/14, then decreased to 1000mg 71m on 10/5, held on 10/12   INTERIM HISTORY: [place] for follow-up.  She states she feels about the same as last week. She still complains  of moderate fatigue, persistent headaches, which is controlled by taking Vicodin twice daily, and a depressed mood in the morning. She also reports dry cough in the past few days, no chest pain or dyspnea. Her mood is improved from during the day. She is accompanied to the clinic by her daughter and son today. No  fever or chills, no easy bruising or bleeding. She has been very compliant with her Hydrea. No noticeable side effects.  MEDICAL HISTORY: Past Medical History  Diagnosis Date  . History of breast cancer onocologist--  dr Jana Hakim--  no recurrence    dx 2011-- DCIS, Right breast,  ER/PR positive --- s/p lumpectomy  No radiation, took Aromasen 2012  . Senile osteoporosis     Took Fosamax x15years  . GERD (gastroesophageal reflux disease)   . Sigmoid diverticulosis   . Wears hearing aid     bilateral  . PMB (postmenopausal bleeding)   . Colon cancer [facility]) oncologist--  dr Truitt Merle    dx jan 2016  --  Ascending colon carcinoma,  Stage IIIB,  pT3 N1a M0,  Grade 2,  MSI,  1 of 15 nodes +//   s/p  right colectomy  . Hypereosinophilic syndrome   . Chronic diarrhea     ALLERGIES:  is allergic to ativan; codeine; and sulfa antibiotics.  MEDICATIONS:  Current Outpatient Prescriptions  Medication Sig Dispense Refill  . ALPRAZolam (XANAX) 0.25 MG tablet Take 0.25 mg by mouth 2 (two) times daily as needed for anxiety.    . Multiple Vitamins-Minerals (CENTRUM SILVER ADULT 50+ PO) Take 1 tablet by mouth daily.    [name] acetaminophen (TYLENOL) 500 MG tablet Take 500 mg by mouth every 6 (six) hours as needed. Taking twice daily for headaches     . Calcium Carbonate-Vitamin D 600-400 MG-UNIT per tablet Take 1 tablet by mouth daily.    . cholecalciferol (VITAMIN D) 1000 UNITS tablet Take 4,000 Units by mouth at bedtime.     . colestipol (MICRONIZED COLESTIPOL HCL) 1 G tablet Take 1 g by mouth daily.     . Fe Fum-Fe Poly-Vit C-Lactobac (FUSION PO) Take 1 tablet by mouth daily.    [name] HYDROcodone-acetaminophen (NORCO/VICODIN) 5-325 MG tablet Take 1 tablet by mouth every 6 (six) hours as needed for moderate pain. 60 tablet 0  . hydroxyurea (HYDREA) 500 MG capsule One capsule in am & 2 capsules in the pm (Patient taking differently: Two capsule in am & 3 capsules in the pm) 90 capsule 0  . mirtazapine (REMERON)  15 MG tablet Take 1 tablet (15 mg total) by mouth at bedtime. 30 tablet 2  . OVER THE COUNTER MEDICATION Take 1 tablet by mouth at bedtime. Macular supplement    . potassium chloride SA (K-DUR,KLOR-CON) 20 MEQ tablet Take 1 tablet (20 mEq total) by mouth daily. 14 tablet 0  . prochlorperazine (COMPAZINE) 10 MG tablet Take 1 tablet (10 mg total) by mouth every 6 (six) hours as needed for nausea or vomiting. (Patient not taking: Reported on 10/31/2014) 30 tablet 0   No current facility-administered medications for this visit.    SURGICAL HISTORY:  Past Surgical History  Procedure Laterality Date  . Cataract extraction w/ intraocular lens  implant, bilateral Bilateral 3614,4315  . Tonsillectomy and adenoidectomy  1930  . Colonoscopy N/A 01/28/2014    Procedure: COLONOSCOPY;  Surgeon: Missy Sabins, MD;  Location: [facility];  Service: Endoscopy;  Laterality: N/A;  . Esophagogastroduodenoscopy N/A 01/28/2014    Procedure: ESOPHAGOGASTRODUODENOSCOPY (EGD);  Surgeon: Missy Sabins, MD;  Location: [facility];  Service: Endoscopy;  Laterality: N/A;  . Laparoscopic partial right colectomy Right 01/30/2014    Procedure: LAPAROSCOPIC PARTIAL RIGHT COLECTOMY;  Surgeon: Pedro Earls, MD;  Location: [facility] OR[facility];  Service: General;  Laterality: Right;  . Laparotomy N/A 01/31/2014    Procedure: EXPLORATORY LAPAROTOMY, RESECTION OF PRIOR SMALL BOWEL ANASTOMOSIS, UPPER ENDOSCOPY, CREATED A  NEW ILEO COLOSTOMY;  Surgeon: Pedro Earls, MD;  Location: [facility];  Service: General;  Laterality: N/A;  . Laparoscopic cholecystectomy  05-26-2009  . Breast lumpectomy Right 12-24-2009  . Transthoracic echocardiogram  04-18-2014    mild LVH/  ef 39-76%/  grade I diastolic dysfunction/  mild LAE/  trivial MR and TR  . Dilatation & currettage/hysteroscopy with resectocope N/A 05/16/2014    Procedure: DILATATION & CURETTAGE/HYSTEROSCOPY WITH RESECTOCOPE;  Surgeon: Arvella Nigh, MD;  Location: [facility];  Service:  Gynecology;  Laterality: N/A;  . Laparoscopy N/A 05/16/2014    Procedure: LAPAROSCOPY DIAGNOSTIC;  Surgeon: Arvella Nigh, MD;  Location: [facility];  Service: Gynecology;  Laterality: N/A;   ROS  Constitutional: Denies fevers, chills or abnormal night sweats, (+) weight loss and fatigue  Eyes: Denies blurriness of vision, double vision or watery eyes Ears, nose, mouth, throat, and face: Denies mucositis or sore throat Respiratory: Denies cough, dyspnea or wheezes Cardiovascular: Denies palpitation, chest discomfort or lower extremity swelling Gastrointestinal: Denies nausea, heartburn, (+) diarrhea Skin: No skin rash  Lymphatics: Denies new lymphadenopathy or easy bruising Neurological:Denies numbness, tingling or new weaknesses, (+) headaches Behavioral/Psych: (+) Depression and anxiety, slightly worse lately   All other systems were reviewed with the patient and are negative.  PHYSICAL EXAMINATION: Filed Vitals:   11/07/14 1027  BP: 102/44  Pulse: 98  Temp: 98.2 F (36.8 C)  Resp: 18   Body mass index is 20.68 kg/(m^2).  ECOG PERFORMANCE STATUS: 2  Sclerae unicteric, pupils equal and reactive Oropharynx clear and moist-- no thrush or other lesions No cervical or supraclavicular adenopathy Lungs no rales or rhonchi Heart regular rate and rhythm Abd soft, surgical scars well healed, nontender, positive bowel sounds, no masses palpated MSK no focal spinal tenderness, no upper extremity lymphedema Neuro: nonfocal, well oriented, appropriate affect Breasts: The right breast is status post lumpectomy. There is no evidence of local recurrence. The right axilla is benign. The left breast is unremarkable. Skin: scatter macular skin rash on the upper front chest, no rashes on the arms, legs or back.  LABORATORY DATA: Outside labs reviewed CBC Latest Ref Rng 11/07/2014 10/31/2014 10/26/2014  WBC 3.9 - 10.3 10e3/uL 14.0(H) 44.8(H) 107.4(HH)  Hemoglobin 11.6 - 15.9  g/dL 8.8(L) 10.8(L) 11.1(L)  Hematocrit 34.8 - 46.6 % 27.2(L) 32.6(L) 33.7(L)  Platelets 145 - 400 10e3/uL 37(L) 76 Large & giant platelets(L) 114(L)    CMP Latest Ref Rng 10/31/2014 10/26/2014 10/22/2014  Glucose 70 - 140 mg/dl 132 127 142(H)  BUN 7.0 - 26.0 mg/dL 20.1 13.2 12.1  Creatinine 0.6 - 1.1 mg/dL 0.8 0.8 0.8  Sodium 136 - 145 mEq/L 140 141 139  Potassium 3.5 - 5.1 mEq/L 4.0 4.1 3.3(L)  Chloride 96 - 112 mEq/L - - -  CO2 22 - 29 mEq/L 25 21(L) 25  Calcium 8.4 - 10.4 mg/dL 9.4 9.5 9.1  Total Protein 6.4 - 8.3 g/dL 6.4 6.4 6.7  Total Bilirubin 0.20 - 1.20 mg/dL 0.68 0.65 0.68  Alkaline Phos 40 - 150 U/L 64 79 88  AST 5 -  34 U/L 14 20 19   ALT 0 - 55 U/L 17 26 22    CEA  Status: Finalresult Visible to patient:  MyChart Nextappt: 11/12/2014 at 02:00 PM in Oncology [facility] Lab 4) Dx:  Colon cancer ([place])           Ref Range 7d ago  4wk ago  53moago  867mogo     CEA 0.0 - 5.0 ng/mL 0.8 2.0 2.6 2.2         Pathology reports: Colon, segmental resection for tumor, partial right 01/30/2014  1 of 3 FINAL for [place], Lyndsay A ([id number](continued) INVASIVE ADENOCARCINOMA, INVADING THROUGH THE MUSCULARIS PROPRIA INTO PERICOLONIC FATTY TISSUE. ONE OF FIFTEEN LYMPH NODES, POSITIVE FOR METASTATIC ADENOCARCINOMA (1/15). RESECTION MARGINS, NEGATIVE FOR TUMOR. APPENDIX: BENIGN APPENDICEAL TISSUE, NO EVIDENCE OF ACTIVE INFLAMMATION OR NEOPLASM. Specimen: Right colon. Procedure: Segmental resection Tumor site: Ascending colon Specimen integrity: Intact Macroscopic intactness of mesorectum: Not applicable. Macroscopic tumor perforation: N/A Invasive tumor: Maximum size: 5.0 cm Histologic type(s): Invasive adenocarcinoma with extracellular mucin Histologic grade and differentiation: G2: moderately differentiated/low grade Type of polyp in which invasive carcinoma arose: N/A Microscopic extension of invasive tumor: invading through muscularis  propria into pericolonic fatty tissue. Lymph-Vascular invasion: Not identified Peri-neural invasion: No Tumor deposit(s) (discontinuous extramural extension): No Resection margins: Negative Proximal margin: 10 cm Distal margin: 9 cm Circumferential (radial) (posterior ascending, posterior descending; lateral and posterior mid-rectum; and entire lower 1/3 rectum): 3 cm Treatment effect (neo-adjuvant therapy): No Additional polyp(s): N/A Non-neoplastic findings: N/A Lymph nodes: number examined 1; number positive: 15 Pathologic Staging: p T3, N1a, Mx Ancillary studies: MMR stains will be RADIOGRAPHIC STUDIES:  COMPARISON: Multiple priors  ACR Breast Density Category b: There are scattered areas of  fibroglandular density.  FINDINGS:  The patient has had a right lumpectomy. No new mass or suspicious  calcifications are seen in either breast. Compared to priors.  Mammographic images were processed with CAD.  IMPRESSION:  No mammographic evidence of local recurrence, right breast. No  mammographic evidence of malignancy, left breast.  RECOMMENDATION:  Diagnostic mammography in 1 year.  I have discussed the findings and recommendations with the patient.  Results were also provided in writing at the conclusion of the  visit. If applicable, a reminder letter will be sent to the patient  regarding the next appointment.  BI-RADS CATEGORY 1: Negative  Electronically Signed  By: KeDuke Salvia.D.  On: 12/02/2012 11:47   ADDITIONAL INFORMATION: Mismatch Repair (MMR) Protein Immunohistochemistry (IHC) IHC Expression Result: MLH1: LOSS OF NUCLEAR EXPRESSION (LESS THAN 5% TUMOR EXPRESSION) MSH2: Preserved nuclear expression (greater 50% tumor expression) MSH6: Preserved nuclear expression (greater 50% tumor expression) PMS2: LOSS OF NUCLEAR EXPRESSION (LESS THAN 5% TUMOR EXPRESSION) * Internal control demonstrates intact nuclear expression Interpretation: ABNORMAL There is loss of  the major and minor MMR proteins MLH1 and PMS2. The loss of expression may be secondary to promoter hyper-methylation, gene mutation or other genetic event. BRAF mutation testing and/or MLH1 methylation testing is indicated. The presence of a BRAF mutation and/or MLH1 hyper-methylation is indicative of a sporadic-type tumor. The absence of either BRAF mutation and/or presence of normal-methylation indicate the possible presence of a hereditary germline mutation (e.g. Lynch syndrome) and referral to genetic counseling is warranted. It is recommended that the loss of protein expression be correlated with molecular based MSI testing.   wil  Bone Marrow, Aspirate,Biopsy, and Clot, right iliac 04/16/2014 - HYPERCELLULAR BONE MARROW FOR AGE WITH GRANULOCYTIC PROLIFERATION INCLUDING NEUTROPHILIA AND EOSINOPHILIA. -  MEGAKARYOCYTIC PROLIFERATION. - SEE COMMENT. PERIPHERAL BLOOD: - LEUKOCYTOSIS WITH NEUTROPHILIA, EOSINOPHILIA AND BASOPHILIA. Diagnosis Note It is unclear whether the morphologic features represent a secondary or a primary clonal process particularly a myeloproliferative myeloproliferative/myelodysplastic process with eosinophilia. Previous FISH studies performed on peripheral blood including PDGFRA, PGDFRB, FGFR1, BCR-ABL, and CBFB are all negative. Selected FISH studies will be repeated on bone marrow material and the results reported under separate cover. There is no increase in blastic cells or evidence of metastatic carcinoma in this material.    ASSESSMENT: 81 y.o. [place] woman  1. Hypereosinophilia syndrome (HES), vs myeloproliferative neoplasm  -Her WBC was normal until June 2015. Her total WBC has been significantly increased since Nov 2015, with predominant neutrophils and eosinophils. Her basophil has been slightly elevated also. Her absolute eosinophils count is in the range 6-8K/ul -Her bone marrow was hypercellular, with granularcytic proliferation including  neutrophilia and eosinophilia. The eosinophil count in bone marrow was 14%  -BCR/ABL and JAK2 gene mutation( -). -Both blood and bone marrow FISH testing for PDGFRA, PGDFRB, FGFR1, BCR-ABL, and CBFB are all negative. -Her skin rash biopsy showed perivascular eosinophil infiltrates, which is supported for hypereosinophil syndrome -Giving the significant elevated eosinophil counts and skin involvement, she meets the criteria for hypereosinophilia syndrome. However reactive eosinophilia is not ruled out. -Her chest CT and echocardiogram were unremarkable.  -Giving the negative PDGFR, she would not benefit from [place]. -Her skin rash responded to steroids very well, but her WBC count has been persistently increasing, despite intermittent steroids therapy. -Her bone marrow biopsy has some features of myeloproliferative neoplasm, given her rapid increase of total white count, I suspect she has myeloproliferative neoplasm. - I have gradually increased her Hydrea dose, her  WBC came down to 14K  Today,  Plt 37K,  I'll hold on Hydrea for now and repeat CBC in 5 days, likely will restart at a lower dose when her blood counts recovers  -I'll see her back next week.   2. Anemia and thrombocytopenia -Secondary to Hydrea -We'll hold Hydrea for now. Risk of bleeding discussed with patient -Her hemoglobin 8.8 today, her fatigue has not changed much, we'll hold on blood transfusion and monitoring for now.  3. Headaches, fatigue, depression and anorexia   -she will continue vicodin as needed for headaches  - she has switched from Zoloft to mirtazapine  4.  Right colon cancer, pT3N1aM0, stage IIIB, G2, MSI high -I reviewed her surgical past findings with her and her husband, daughter in great details.  -I reviewed her staging CT of chest, which is negative for metastasis. -Given her locally advanced stage and elevated preoperative CEA, she has high risks of cancer recurrence, approximately 50%, especially in  the first few years. -I discussed that the standard care is adjuvant chemotherapy with FOLFOX for stage III colon cancer. However given her advanced age, limited performance status after surgery, I do not think she is a good candidate for adjuvant chemotherapy. Her tumor has high microsatellite instability, which is a good prognostic marker, also predicts a poor response to single agent 5-FU.  -After lengthy discussion, we agreed to observe her closely after surgery. I discussed the surveillance plan, with lab and physical exam every 3-4 months, repeat colonoscopy and CT scan in one year. -We also discussed the treatment option if she has cancer recurrence in the future, which includes surgery, systemic therapy especially immunotherapy for metastatic disease. Clinical data has shown to accelerate response (70%) with PD1 antibody to colon cancer with MSI  high.  -Her CEA has been normal, we'll continue surveillance -She is scheduled for restaging CT scan next Monday.  5. GENETICS -She underwent genetic test, which was negative for Lynch syndrome. 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 -She denied further somatic mutation test (such as punch skin biopsy)  6. History of right breast DCIS, ER/PR positive. -The patient previously couldn't tolerate aromatase inhibitor.  -No evidence of disease recurrence on exam. -Continue observation  -Continue yearly mammogram  Follow-up:  -Hold on Hydrea for now. Likely will restart next week when her plt recovers  -She is scheduled to have CT scan next Monday -I'll repeat her labs and see her next Monday after CT scan   I spent 30 minutes, more than 50% time face to face counseling for her visit.  Truitt Merle, MD

## 2014-11-07 NOTE — Progress Notes (Signed)
Subjective:     Patient ID: April [name], female   DOB: October 13, 1925, 81 y.o.   MRN: [id number]  HPI   Review of Systems     Objective:   Physical Exam To assist the pt in identifying some dietary strategies to gain some lost weight back.     Assessment:     Pt identified as being malnourished due to losing some weight. Pt was contacted via the telephone at [phone]. Pt was unavailable and her husband April [name] answered. April [name] states the pt is at an appointment with the cancer center. April [name] was enthusiastic about receiving some information to help his wife gain weight.     Plan:     Dietitian will contact Ernestene Kiel CSO,RD,LDN about sending the pt some information.

## 2014-11-12 ENCOUNTER — Telehealth: Payer: Self-pay | Admitting: Hematology

## 2014-11-12 ENCOUNTER — Encounter: Payer: Self-pay | Admitting: Hematology

## 2014-11-12 ENCOUNTER — Encounter (HOSPITAL_COMMUNITY): Payer: Self-pay

## 2014-11-12 ENCOUNTER — Ambulatory Visit (HOSPITAL_COMMUNITY)
Admission: RE | Admit: 2014-11-12 | Discharge: 2014-11-12 | Disposition: A | Discharge status: Home / Self Care | Payer: Medicare Other | Source: Ambulatory Visit | Attending: Hematology | Admitting: Hematology

## 2014-11-12 ENCOUNTER — Other Ambulatory Visit (HOSPITAL_BASED_OUTPATIENT_CLINIC_OR_DEPARTMENT_OTHER): Payer: Medicare Other

## 2014-11-12 ENCOUNTER — Ambulatory Visit (HOSPITAL_BASED_OUTPATIENT_CLINIC_OR_DEPARTMENT_OTHER): Payer: Medicare Other | Admitting: Hematology

## 2014-11-12 VITALS — BP 100/40 | HR 97 | Temp 99.2°F | Resp 18 | Ht 65.0 in | Wt 126.6 lb

## 2014-11-12 DIAGNOSIS — J90 Pleural effusion, not elsewhere classified: Secondary | ICD-10-CM | POA: Insufficient documentation

## 2014-11-12 DIAGNOSIS — C182 Malignant neoplasm of ascending colon: Secondary | ICD-10-CM | POA: Insufficient documentation

## 2014-11-12 DIAGNOSIS — D6481 Anemia due to antineoplastic chemotherapy: Secondary | ICD-10-CM

## 2014-11-12 DIAGNOSIS — R161 Splenomegaly, not elsewhere classified: Secondary | ICD-10-CM | POA: Insufficient documentation

## 2014-11-12 DIAGNOSIS — D721 Eosinophilia: Secondary | ICD-10-CM

## 2014-11-12 DIAGNOSIS — Z08 Encounter for follow-up examination after completed treatment for malignant neoplasm: Secondary | ICD-10-CM | POA: Diagnosis present

## 2014-11-12 DIAGNOSIS — D72119 Hypereosinophilic syndrome (hes), unspecified: Secondary | ICD-10-CM

## 2014-11-12 DIAGNOSIS — M4856XA Collapsed vertebra, not elsewhere classified, lumbar region, initial encounter for fracture: Secondary | ICD-10-CM | POA: Insufficient documentation

## 2014-11-12 DIAGNOSIS — F329 Major depressive disorder, single episode, unspecified: Secondary | ICD-10-CM | POA: Diagnosis not present

## 2014-11-12 DIAGNOSIS — R51 Headache: Secondary | ICD-10-CM | POA: Diagnosis not present

## 2014-11-12 DIAGNOSIS — R630 Anorexia: Secondary | ICD-10-CM | POA: Diagnosis not present

## 2014-11-12 DIAGNOSIS — D696 Thrombocytopenia, unspecified: Secondary | ICD-10-CM

## 2014-11-12 DIAGNOSIS — R053 Chronic cough: Secondary | ICD-10-CM

## 2014-11-12 DIAGNOSIS — C189 Malignant neoplasm of colon, unspecified: Secondary | ICD-10-CM | POA: Diagnosis not present

## 2014-11-12 DIAGNOSIS — Z853 Personal history of malignant neoplasm of breast: Secondary | ICD-10-CM

## 2014-11-12 DIAGNOSIS — R05 Cough: Secondary | ICD-10-CM

## 2014-11-12 LAB — CBC WITH DIFFERENTIAL/PLATELET
BASO%: 0.5 % (ref 0.0–2.0)
Basophils Absolute: 0.2 10*3/uL — ABNORMAL HIGH (ref 0.0–0.1)
EOS%: 0.2 % (ref 0.0–7.0)
Eosinophils Absolute: 0.1 10*3/uL (ref 0.0–0.5)
HEMATOCRIT: 27.8 % — AB (ref 34.8–46.6)
HGB: 8.9 g/dL — ABNORMAL LOW (ref 11.6–15.9)
LYMPH#: 4.5 10*3/uL — AB (ref 0.9–3.3)
LYMPH%: 9.3 % — ABNORMAL LOW (ref 14.0–49.7)
MCH: 26.9 pg (ref 25.1–34.0)
MCHC: 32.1 g/dL (ref 31.5–36.0)
MCV: 83.9 fL (ref 79.5–101.0)
MONO#: 8.9 10*3/uL — AB (ref 0.1–0.9)
MONO%: 18.4 % — ABNORMAL HIGH (ref 0.0–14.0)
NEUT#: 34.8 10*3/uL — ABNORMAL HIGH (ref 1.5–6.5)
NEUT%: 71.6 % (ref 38.4–76.8)
PLATELETS: 53 10*3/uL — AB (ref 145–400)
RBC: 3.31 10*6/uL — ABNORMAL LOW (ref 3.70–5.45)
RDW: 19.9 % — ABNORMAL HIGH (ref 11.2–14.5)
WBC: 48.6 10*3/uL — AB (ref 3.9–10.3)

## 2014-11-12 LAB — TECHNOLOGIST REVIEW: Technologist Review: 2

## 2014-11-12 MED ORDER — HYDROCODONE-HOMATROPINE 5-1.5 MG PO TABS: 1.0000 | ORAL_TABLET | Freq: Four times a day (QID) | ORAL | Status: DC | PRN

## 2014-11-12 MED ORDER — IOHEXOL 300 MG/ML  SOLN
100.0000 mL | Freq: Once | INTRAMUSCULAR | Status: AC | PRN
  Administered 2014-11-12: 75 mL via INTRAVENOUS

## 2014-11-12 NOTE — Progress Notes (Signed)
[place] OFFICE PROGRESS NOTE  CC: Dr. Neldon Mc Dr. Carmela Rima, [facility], DO [address] [place] [address] OTHER MDS: Clayton Lefort  DIAGNOSIS: stage III colon cancer, hypereosinophilic syndrome/MPN  Colon cancer   Staging form: Colon and Rectum, AJCC 7th Edition     Clinical stage from 01/30/2014: Stage IIIB (T3, N1a, M0) - Unsigned     Colon cancer ([place])   01/28/2014 Tumor Marker CEA  14.6. Tumor MMR deficient, MSI high   01/30/2014 Initial Diagnosis Colon cancer   01/30/2014 Surgery right colon segmental resection, with clear margins.   01/30/2014 Pathologic Stage PT3N1aM0, stage IIIB, 1 out of 15 lymph nodes was positive.    Hypereosinophilic syndrome   06/30/5407 Bone Marrow Biopsy her bone marrow showed hypercellular with  granulocytic poor proliferation including neutrophilia and eosinophil, megakaryocyte proliferation. Fish was negative for PDGFRA, PDGFRB, FGFR1, BCR-ABLand CBFB, unclear if primary or secondary MPN/MDS process.    04/16/2014 Initial Diagnosis Hypereosinophilic syndrome   09/05/9145 -  Chemotherapy his white count went up to 100,000, I started her on Hydrea, dose titrated to 1 g twice daily   OTHER ISSUES: 1. 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 patient was then begun on Aromasin 25 mg daily however she was discontinued 9 days later due to the fact that she developed  grade 3 arthralgias and myalgias. 2. Leukocytosis since July 2015, with significant neutrophilia and eosinophilia,likely hypereosinophilic syndrome  3. History of thrombocytopenia when she was young  HISTORY OF INITIAL PRESENTATION (03/02/2014): Mrs. Keys is a 81 year old Caucasian female, with past medical history of breast DCIS, previously under my colleague Dr. Virgie Dad care, and was transferred to me due to her recent diagnosed colon cancer.  She presented with diarrhea for 6 month and acute rectal  bleeding and was admitted to hospital on 01/27/2014. She was transfused PRBCs. CT scan suggested bleeding from angiodysplasia. She was admitted for further management. She she underwent colonoscopy and EGD. Colonic mass was noted along with some abnormal duodenal findings. Biopsy of the colon mass showed adenocarcinoma. The patient underwent laproscopic R hemicolectomy on 1/5. A large bleed developed post-operatively and the patient was first transferred to the ICU and later underwent ex-lap with resection of small bowel anastamosis and new ileocolostomy on 1/6. She was discharged to a rehab for a week and got home about one weeks ago.   She has home PT also now. She is able to walk one mile daily and all does all her ADLs. She has not gone outside yet.  She denies any pain, no nausea, her appetite has been low since surgery, she eats cheese, soup, and small regular meals, she lost 15lbs since the surgery. She has headaches for the past few month ago, and had brain MRI in the hospital which was negative.  She still has intermittent loose BM, 1-2 daily.   She was also noticed to have significant leukocytosis since July 2015, with dominantly elevated neutrophil and eosinophil. She had some cytopenia when she was young, and developed some cyst pia around her colon surgery, now resolved.  CURRENT THERAPY: hydrea 542m daily started on 10/11/14, changed to 5078min am, and 100062mm on 10/23/14, increased to 1000m68m AM, 1500mg73mpm on 10/26/14, then decreased to 1000mg 7m on 10/5, held on 10/12, restart at 500mg d41m from 11/13/14   INTERIM HISTORY: FrancesPetronas for follow-up.  She is still very fatigued, and  complains about worsening dry cough. Her cough has been going on about a month's, with occasional clear mucus production, no chest pain, she does have dyspnea on exertion. No fever or chills. also sick at home. She still has moderate persistent headaches, she will call she had similar headaches before,  but cannot tell how often or hot long it lasted before. Her recent episode of headache has been going on for more than a months now. She denies any significant bleeding lately, no fever or chills. Appetite is moderate, she is eating better than 2 weeks ago.  MEDICAL HISTORY: Past Medical History  Diagnosis Date  . History of breast cancer onocologist--  dr Jana Hakim--  no recurrence    dx 2011-- DCIS, Right breast,  ER/PR positive --- s/p lumpectomy  No radiation, took Aromasen 2012  . Senile osteoporosis     Took Fosamax x15years  . GERD (gastroesophageal reflux disease)   . Sigmoid diverticulosis   . Wears hearing aid     bilateral  . PMB (postmenopausal bleeding)   . Colon cancer [facility]) oncologist--  dr Truitt Merle    dx jan 2016  --  Ascending colon carcinoma,  Stage IIIB,  pT3 N1a M0,  Grade 2,  MSI,  1 of 15 nodes +//   s/p  right colectomy  . Hypereosinophilic syndrome   . Chronic diarrhea     ALLERGIES:  is allergic to ativan; codeine; and sulfa antibiotics.  MEDICATIONS:  Current Outpatient Prescriptions  Medication Sig Dispense Refill  . ALPRAZolam (XANAX) 0.25 MG tablet Take 0.25 mg by mouth 2 (two) times daily as needed for anxiety.    . Calcium Carbonate-Vitamin D 600-400 MG-UNIT per tablet Take 1 tablet by mouth daily.    . cholecalciferol (VITAMIN D) 1000 UNITS tablet Take 4,000 Units by mouth at bedtime.     . Fe Fum-Fe Poly-Vit C-Lactobac (FUSION PO) Take 1 tablet by mouth daily.    [name] HYDROcodone-acetaminophen (NORCO/VICODIN) 5-325 MG tablet Take 1 tablet by mouth every 6 (six) hours as needed for moderate pain. 60 tablet 0  . mirtazapine (REMERON) 15 MG tablet Take 1 tablet (15 mg total) by mouth at bedtime. 30 tablet 2  . Multiple Vitamins-Minerals (CENTRUM SILVER ADULT 50+ PO) Take 1 tablet by mouth daily.    [name] OVER THE COUNTER MEDICATION Take 1 tablet by mouth at bedtime. Macular supplement    . Hydrocodone-Homatropine 5-1.5 MG TABS Take 1 tablet by mouth every 6  (six) hours as needed. 30 tablet 0  . hydroxyurea (HYDREA) 500 MG capsule One capsule in am & 2 capsules in the pm (Patient not taking: Reported on 11/12/2014) 90 capsule 0  . prochlorperazine (COMPAZINE) 10 MG tablet Take 1 tablet (10 mg total) by mouth every 6 (six) hours as needed for nausea or vomiting. (Patient not taking: Reported on 10/31/2014) 30 tablet 0   No current facility-administered medications for this visit.    SURGICAL HISTORY:  Past Surgical History  Procedure Laterality Date  . Cataract extraction w/ intraocular lens  implant, bilateral Bilateral [id number]  . Tonsillectomy and adenoidectomy  1930  . Colonoscopy N/A 01/28/2014    Procedure: COLONOSCOPY;  Surgeon: Missy Sabins, MD;  Location: [facility]COPY;  Service: Endoscopy;  Laterality: N/A;  . Esophagogastroduodenoscopy N/A 01/28/2014    Procedure: ESOPHAGOGASTRODUODENOSCOPY (EGD);  Surgeon: Missy Sabins, MD;  Location: [facility];  Service: Endoscopy;  Laterality: N/A;  . Laparoscopic partial right colectomy Right 01/30/2014    Procedure: LAPAROSCOPIC PARTIAL RIGHT  COLECTOMY;  Surgeon: Pedro Earls, MD;  Location: [facility] ORS;  Service: General;  Laterality: Right;  . Laparotomy N/A 01/31/2014    Procedure: EXPLORATORY LAPAROTOMY, RESECTION OF PRIOR SMALL BOWEL ANASTOMOSIS, UPPER ENDOSCOPY, CREATED A  NEW ILEO COLOSTOMY;  Surgeon: Pedro Earls, MD;  Location: [facility] ORS;  Service: General;  Laterality: N/A;  . Laparoscopic cholecystectomy  05-26-2009  . Breast lumpectomy Right 12-24-2009  . Transthoracic echocardiogram  04-18-2014    mild LVH/  ef 09-23%/  grade I diastolic dysfunction/  mild LAE/  trivial MR and TR  . Dilatation & currettage/hysteroscopy with resectocope N/A 05/16/2014    Procedure: DILATATION & CURETTAGE/HYSTEROSCOPY WITH RESECTOCOPE;  Surgeon: Arvella Nigh, MD;  Location: [place];  Service: Gynecology;  Laterality: N/A;  . Laparoscopy N/A 05/16/2014    Procedure: LAPAROSCOPY DIAGNOSTIC;   Surgeon: Arvella Nigh, MD;  Location[facility];  Service: Gynecology;  Laterality: N/A;   ROS  Constitutional: Denies fevers, chills or abnormal night sweats, (+) weight loss and fatigue  Eyes: Denies blurriness of vision, double vision or watery eyes Ears, nose, mouth, throat, and face: Denies mucositis or sore throat Respiratory: Denies cough, dyspnea or wheezes Cardiovascular: Denies palpitation, chest discomfort or lower extremity swelling Gastrointestinal: Denies nausea, heartburn, (+) diarrhea Skin: No skin rash  Lymphatics: Denies new lymphadenopathy or easy bruising Neurological:Denies numbness, tingling or new weaknesses, (+) headaches Behavioral/Psych: (+) Depression and anxiety, slightly worse lately   All other systems were reviewed with the patient and are negative.  PHYSICAL EXAMINATION: Filed Vitals:   11/12/14 1540  BP: 100/40  Pulse: 97  Temp: 99.2 F (37.3 C)  Resp: 18   Body mass index is 21.07 kg/(m^2).  ECOG PERFORMANCE STATUS: 2-3  Sclerae unicteric, pupils equal and reactive Oropharynx clear and moist-- no thrush or other lesions No cervical or supraclavicular adenopathy Lungs no rales or rhonchi Heart regular rate and rhythm Abd soft, surgical scars well healed, nontender, positive bowel sounds, no masses palpated MSK no focal spinal tenderness, no upper extremity lymphedema Neuro: nonfocal, well oriented, appropriate affect Breasts: The right breast is status post lumpectomy. There is no evidence of local recurrence. The right axilla is benign. The left breast is unremarkable. Skin: no new skin rashes, previous skin rash has resolved   LABORATORY DATA: Outside labs reviewed CBC Latest Ref Rng 11/12/2014 11/07/2014 10/31/2014  WBC 3.9 - 10.3 10e3/uL 48.6(H) 14.0(H) 44.8(H)  Hemoglobin 11.6 - 15.9 g/dL 8.9(L) 8.8(L) 10.8(L)  Hematocrit 34.8 - 46.6 % 27.8(L) 27.2(L) 32.6(L)  Platelets 145 - 400 10e3/uL 53(L) 37(L) 76 Large & giant  platelets(L)    CMP Latest Ref Rng 10/31/2014 10/26/2014 10/22/2014  Glucose 70 - 140 mg/dl 132 127 142(H)  BUN 7.0 - 26.0 mg/dL 20.1 13.2 12.1  Creatinine 0.6 - 1.1 mg/dL 0.8 0.8 0.8  Sodium 136 - 145 mEq/L 140 141 139  Potassium 3.5 - 5.1 mEq/L 4.0 4.1 3.3(L)  Chloride 96 - 112 mEq/L - - -  CO2 22 - 29 mEq/L 25 21(L) 25  Calcium 8.4 - 10.4 mg/dL 9.4 9.5 9.1  Total Protein 6.4 - 8.3 g/dL 6.4 6.4 6.7  Total Bilirubin 0.20 - 1.20 mg/dL 0.68 0.65 0.68  Alkaline Phos 40 - 150 U/L 64 79 88  AST 5 - 34 U/L 14 20 19   ALT 0 - 55 U/L 17 26 22    CEA  Status: Finalresult Visible to patient:  MyChart Nextappt: 11/12/2014 at 02:00 PM in [facility] 4) Dx:  Colon cancer ([place])           Ref Range 7d ago  4wk ago  45moago  821mogo     CEA 0.0 - 5.0 ng/mL 0.8 2.0 2.6 2.2         Pathology reports: Colon, segmental resection for tumor, partial right 01/30/2014  1 of 3 FINAL for [place], [place] A (S([id number](continued) INVASIVE ADENOCARCINOMA, INVADING THROUGH THE MUSCULARIS PROPRIA INTO PERICOLONIC FATTY TISSUE. ONE OF FIFTEEN LYMPH NODES, POSITIVE FOR METASTATIC ADENOCARCINOMA (1/15). RESECTION MARGINS, NEGATIVE FOR TUMOR. APPENDIX: BENIGN APPENDICEAL TISSUE, NO EVIDENCE OF ACTIVE INFLAMMATION OR NEOPLASM. Specimen: Right colon. Procedure: Segmental resection Tumor site: Ascending colon Specimen integrity: Intact Macroscopic intactness of mesorectum: Not applicable. Macroscopic tumor perforation: N/A Invasive tumor: Maximum size: 5.0 cm Histologic type(s): Invasive adenocarcinoma with extracellular mucin Histologic grade and differentiation: G2: moderately differentiated/low grade Type of polyp in which invasive carcinoma arose: N/A Microscopic extension of invasive tumor: invading through muscularis propria into pericolonic fatty tissue. Lymph-Vascular invasion: Not identified Peri-neural invasion: No Tumor deposit(s) (discontinuous  extramural extension): No Resection margins: Negative Proximal margin: 10 cm Distal margin: 9 cm Circumferential (radial) (posterior ascending, posterior descending; lateral and posterior mid-rectum; and entire lower 1/3 rectum): 3 cm Treatment effect (neo-adjuvant therapy): No Additional polyp(s): N/A Non-neoplastic findings: N/A Lymph nodes: number examined 1; number positive: 15 Pathologic Staging: p T3, N1a, Mx Ancillary studies: MMR stains will be RADIOGRAPHIC STUDIES:  COMPARISON: Multiple priors  ACR Breast Density Category b: There are scattered areas of  fibroglandular density.  FINDINGS:  The patient has had a right lumpectomy. No new mass or suspicious  calcifications are seen in either breast. Compared to priors.  Mammographic images were processed with CAD.  IMPRESSION:  No mammographic evidence of local recurrence, right breast. No  mammographic evidence of malignancy, left breast.  RECOMMENDATION:  Diagnostic mammography in 1 year.  I have discussed the findings and recommendations with the patient.  Results were also provided in writing at the conclusion of the  visit. If applicable, a reminder letter will be sent to the patient  regarding the next appointment.  BI-RADS CATEGORY 1: Negative  Electronically Signed  By: KeDuke Salvia.D.  On: 12/02/2012 11:47   ADDITIONAL INFORMATION: Mismatch Repair (MMR) Protein Immunohistochemistry (IHC) IHC Expression Result: MLH1: LOSS OF NUCLEAR EXPRESSION (LESS THAN 5% TUMOR EXPRESSION) MSH2: Preserved nuclear expression (greater 50% tumor expression) MSH6: Preserved nuclear expression (greater 50% tumor expression) PMS2: LOSS OF NUCLEAR EXPRESSION (LESS THAN 5% TUMOR EXPRESSION) * Internal control demonstrates intact nuclear expression Interpretation: ABNORMAL There is loss of the major and minor MMR proteins MLH1 and PMS2. The loss of expression may be secondary to promoter hyper-methylation, gene mutation or  other genetic event. BRAF mutation testing and/or MLH1 methylation testing is indicated. The presence of a BRAF mutation and/or MLH1 hyper-methylation is indicative of a sporadic-type tumor. The absence of either BRAF mutation and/or presence of normal-methylation indicate the possible presence of a hereditary germline mutation (e.g. Lynch syndrome) and referral to genetic counseling is warranted. It is recommended that the loss of protein expression be correlated with molecular based MSI testing.   wil  Bone Marrow, Aspirate,Biopsy, and Clot, right iliac 04/16/2014 - HYPERCELLULAR BONE MARROW FOR AGE WITH GRANULOCYTIC PROLIFERATION INCLUDING NEUTROPHILIA AND EOSINOPHILIA. - MEGAKARYOCYTIC PROLIFERATION. - SEE COMMENT. PERIPHERAL BLOOD: - LEUKOCYTOSIS WITH NEUTROPHILIA, EOSINOPHILIA AND BASOPHILIA. Diagnosis Note It is unclear whether the morphologic features represent a secondary or a primary clonal process particularly a myeloproliferative myeloproliferative/myelodysplastic process  with eosinophilia. Previous FISH studies performed on peripheral blood including PDGFRA, PGDFRB, FGFR1, BCR-ABL, and CBFB are all negative. Selected FISH studies will be repeated on bone marrow material and the results reported under separate cover. There is no increase in blastic cells or evidence of metastatic carcinoma in this material.    ASSESSMENT: 81 y.o. [place] woman  1. Hypereosinophilia syndrome (HES), vs myeloproliferative neoplasm  -Her WBC was normal until June 2015. Her total WBC has been significantly increased since Nov 2015, with predominant neutrophils and eosinophils. Her basophil has been slightly elevated also. Her absolute eosinophils count is in the range 6-8K/ul -Her bone marrow was hypercellular, with granularcytic proliferation including neutrophilia and eosinophilia. The eosinophil count in bone marrow was 14%  -BCR/ABL and JAK2 gene mutation( -). -Both blood and bone marrow  FISH testing for PDGFRA, PGDFRB, FGFR1, BCR-ABL, and CBFB are all negative. -Her skin rash biopsy showed perivascular eosinophil infiltrates, which is supported for hypereosinophil syndrome -Giving the significant elevated eosinophil counts and skin involvement, she meets the criteria for hypereosinophilia syndrome. However reactive eosinophilia is not ruled out. -Her chest CT and echocardiogram were unremarkable.  -Giving the negative PDGFR, she would not benefit from [place]. -Her skin rash responded to steroids very well, but her WBC count has been persistently increasing, despite intermittent steroids therapy. -Her bone marrow biopsy has some features of myeloproliferative neoplasm, given her rapid increase of total white count, I suspect she has myeloproliferative neoplasm. - I have gradually increased her Hydrea dose, she developed moderate anemia and thrombocytopenia from Hydrea. Hydrea was held last week, her platelet count recovered to 53K today, I'll restart Hydrea at 500 mg daily -I'll see her back next week with repeated CBC. We'll continue to titrate her Hydrea dose -I'll refer her to [facility] for second opinion.  2. Anemia and thrombocytopenia -Secondary to Hydrea -Her hemoglobin 8.9 today, her fatigue has not changed much, we'll hold on blood transfusion and monitoring for now. -Plt improved to 53K today, no clinical signs of bleeding.  3. Headaches, fatigue, depression and anorexia   -she will continue vicodin as needed for headaches  - she has switched from Zoloft to mirtazapine, anorexia has improved.  4. Persistent dry cough -The etiology is not clear. Her CT chest was negative today. -I give her a prescription of Hycodan today -We'll check her for bordetella pertussis PCR next week, also my suspicion for whooping cough is low.  5.  Right colon cancer, pT3N1aM0, stage IIIB, G2, MSI high -I reviewed her surgical past findings with  her and her husband, daughter in great details.  -I reviewed her staging CT of chest, which is negative for metastasis. -Given her locally advanced stage and elevated preoperative CEA, she has high risks of cancer recurrence, approximately 50%, especially in the first few years. -I discussed that the standard care is adjuvant chemotherapy with FOLFOX for stage III colon cancer. However given her advanced age, limited performance status after surgery, I do not think she is a good candidate for adjuvant chemotherapy. Her tumor has high microsatellite instability, which is a good prognostic marker, also predicts a poor response to single agent 5-FU.  -After lengthy discussion, we agreed to observe her closely after surgery. I discussed the surveillance plan, with lab and physical exam every 3-4 months, repeat colonoscopy and CT scan in one year. -We also discussed the treatment option if she has cancer recurrence in the future, which includes surgery, systemic therapy especially immunotherapy  for metastatic disease. Clinical data has shown to accelerate response (70%) with PD1 antibody to colon cancer with MSI high.  -Her CEA has been normal, we'll continue surveillance -I reviewed her restaging CT chest, abdomen and pelvis scan findings with patient and her family members, there no evidence of disease recurrence.  6. GENETICS -She underwent genetic test, which was negative for Lynch syndrome. 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 -She denied further somatic mutation test (such as punch skin biopsy)  7.  History of right breast DCIS, ER/PR positive. -The patient previously couldn't tolerate aromatase inhibitor.  -No evidence of disease recurrence on exam. -Continue observation  -Continue yearly mammogram  Follow-up:  -Restart Hydrea at 500 mg daily -I'll see her back in 1 week with lab. -I  will refer her to [facility] hematology for a second opinion  I spent 30 minutes, more than 50% time face to face counseling for her visit.  Truitt Merle, MD  11/12/2014

## 2014-11-12 NOTE — Telephone Encounter (Signed)
per pof to sch pt appt-gave pt copy of avs °

## 2014-11-13 ENCOUNTER — Telehealth: Payer: Self-pay | Admitting: Hematology

## 2014-11-13 NOTE — Addendum Note (Signed)
Addended by: Truitt Merle on: 11/13/2014 08:46 AM   Modules accepted: Orders

## 2014-11-13 NOTE — Telephone Encounter (Signed)
Faxed medical records to [facility] @ [phone]. Per md

## 2014-11-14 ENCOUNTER — Telehealth: Payer: Self-pay | Admitting: *Deleted

## 2014-11-14 ENCOUNTER — Encounter: Payer: Self-pay | Admitting: Internal Medicine

## 2014-11-14 ENCOUNTER — Non-Acute Institutional Stay (SKILLED_NURSING_FACILITY): Payer: Medicare Other | Admitting: Internal Medicine

## 2014-11-14 VITALS — BP 112/52 | HR 108 | Temp 100.7°F | Wt 128.0 lb

## 2014-11-14 DIAGNOSIS — J189 Pneumonia, unspecified organism: Secondary | ICD-10-CM

## 2014-11-14 DIAGNOSIS — D72119 Hypereosinophilic syndrome (hes), unspecified: Secondary | ICD-10-CM

## 2014-11-14 DIAGNOSIS — R05 Cough: Secondary | ICD-10-CM

## 2014-11-14 DIAGNOSIS — R51 Headache: Secondary | ICD-10-CM

## 2014-11-14 DIAGNOSIS — R627 Adult failure to thrive: Secondary | ICD-10-CM

## 2014-11-14 DIAGNOSIS — J90 Pleural effusion, not elsewhere classified: Secondary | ICD-10-CM

## 2014-11-14 DIAGNOSIS — R519 Headache, unspecified: Secondary | ICD-10-CM

## 2014-11-14 DIAGNOSIS — R053 Chronic cough: Secondary | ICD-10-CM

## 2014-11-14 DIAGNOSIS — D721 Eosinophilia: Secondary | ICD-10-CM

## 2014-11-14 DIAGNOSIS — R634 Abnormal weight loss: Secondary | ICD-10-CM

## 2014-11-14 DIAGNOSIS — F331 Major depressive disorder, recurrent, moderate: Secondary | ICD-10-CM

## 2014-11-14 MED ORDER — LEVOFLOXACIN 500 MG PO TABS: 500.0000 mg | ORAL_TABLET | Freq: Every day | ORAL | Status: DC

## 2014-11-14 MED ORDER — CYCLOBENZAPRINE HCL 5 MG PO TABS: 5.0000 mg | ORAL_TABLET | Freq: Three times a day (TID) | ORAL | Status: DC | PRN

## 2014-11-14 MED ORDER — SERTRALINE HCL 50 MG PO TABS: 50.0000 mg | ORAL_TABLET | Freq: Every day | ORAL | Status: AC

## 2014-11-14 NOTE — Progress Notes (Signed)
Patient ID: April [name], female   DOB: [birth year], 81 y.o.   MRN: [id number]   Location: [facility] Provider: Reuben Knoblock L. Mariea Clonts, D.O., C.M.D.  Code Status: DNR   Goals of Care: Advanced Directive information Does patient have an advance directive?: Yes, Type of Advance Directive: [place];Out of facility DNR (pink MOST or yellow form), Pre-existing out of facility DNR order (yellow form or pink MOST form): Pink MOST form placed in chart (order not valid for inpatient use);Yellow form placed in chart (order not valid for inpatient use), Does patient want to make changes to advanced directive?: Yes - information given (MOST done today)MOST done today:  DNR, limited additional interventions (considering do not hospitalize, but trying to manage symptoms here), no feeding tube, IVF and abx short term only  Chief Complaint  Patient presents with  . Acute Visit    weak, cough, poor appetite. Here with husband, daughter Pamala Hurry  . New Admit To SNF    failure to thrive, HCAP    HPI: Patient is a 81 y.o. female seen in the [facility] today for an acute visit which turned into a rehab admission. When asked how she's been doing, Shalawn reports, "worse for past two weeks with dry hacky cough, very weak, nauseous occasionally (today) and two other times.  headache ongoing for months." Headaches are frontal and persistent even with the hydrocodone use.  She has not had relief from tylenol either.  She feels tense and depressed.  She's been anxious and tremulous at times.  Says she is not getting better, but worse.    April [name] was seen this am and requested an appointment for [place] due to concerns about her cough, headache, poor intake, fatigue, and need for rehab here.    She had been on hydrea higher dose for about 3 wks with IV hydration each Friday due to wbc of 107 10/22/14. Last week, her wbc had improved, but then her hgb was down to 8 and her platelets 37.  Hydrea was  held for one week.  Counts then jumped back up and hydrea was resumed just once a day for this week.  She is nauseated from the hydrea it seems, but not improved with compazine.    She had a restaging CT of her abdomen pelvis and chest 2 days ago which revealed bilateral small pleural effusions, splenomegaly, but no metastatic disease.    When she was evaluated by our CMA today, she was noted to be mildly tachycardic, slightly hypotensive and febrile.  Sats within normal limits.  Mildly tachypneic.    We discussed her goals:  She wants more energy, to get rid of her cough and her headache.  She is agreeable to a rehab stay.  MOST form was done as above.  We discussed only continuing treatments that will improve how she feels not lead to any further decline.  We also discussed therapy for strengthening, additional fluids and now antibiotics for suspected pneumonia.     Review of Systems:  Review of Systems  Constitutional: Positive for fever, chills, weight loss and malaise/fatigue.  HENT: Positive for hearing loss. Negative for congestion.   Eyes: Negative for blurred vision.  Respiratory: Positive for cough and shortness of breath. Negative for hemoptysis, sputum production and wheezing.   Cardiovascular: Negative for chest pain and palpitations.  Gastrointestinal: Negative for abdominal pain, constipation, blood in stool and melena.  Genitourinary: Negative for dysuria.  Musculoskeletal: Negative for falls.  Skin: Negative for rash.  Neurological: Positive for weakness. Negative for dizziness.  Endo/Heme/Allergies: Bruises/bleeds easily.  Psychiatric/Behavioral: Positive for depression. Negative for memory loss. The patient is nervous/anxious.     Past Medical History  Diagnosis Date  . History of breast cancer onocologist--  dr Jana Hakim--  no recurrence    dx 2011-- DCIS, Right breast,  ER/PR positive --- s/p lumpectomy  No radiation, took Aromasen 2012  . Senile osteoporosis     Took  Fosamax x15years  . GERD (gastroesophageal reflux disease)   . Sigmoid diverticulosis   . Wears hearing aid     bilateral  . PMB (postmenopausal bleeding)   . Colon cancer [facility]) oncologist--  dr Truitt Merle    dx jan 2016  --  Ascending colon carcinoma,  Stage IIIB,  pT3 N1a M0,  Grade 2,  MSI,  1 of 15 nodes +//   s/p  right colectomy  . Hypereosinophilic syndrome   . Chronic diarrhea     Past Surgical History  Procedure Laterality Date  . Cataract extraction w/ intraocular lens  implant, bilateral Bilateral [id number]  . Tonsillectomy and adenoidectomy  1930  . Colonoscopy N/A 01/28/2014    Procedure: COLONOSCOPY;  Surgeon: Missy Sabins, MD;  Location: [facility];  Service: Endoscopy;  Laterality: N/A;  . Esophagogastroduodenoscopy N/A 01/28/2014    Procedure: ESOPHAGOGASTRODUODENOSCOPY (EGD);  Surgeon: Missy Sabins, MD;  Location: [facility];  Service: Endoscopy;  Laterality: N/A;  . Laparoscopic partial right colectomy Right 01/30/2014    Procedure: LAPAROSCOPIC PARTIAL RIGHT COLECTOMY;  Surgeon: Pedro Earls, MD;  Location: [facility];  Service: General;  Laterality: Right;  . Laparotomy N/A 01/31/2014    Procedure: EXPLORATORY LAPAROTOMY, RESECTION OF PRIOR SMALL BOWEL ANASTOMOSIS, UPPER ENDOSCOPY, CREATED A  NEW ILEO COLOSTOMY;  Surgeon: Pedro Earls, MD;  Location: [facility];  Service: General;  Laterality: N/A;  . Laparoscopic cholecystectomy  05-26-2009  . Breast lumpectomy Right 12-24-2009  . Transthoracic echocardiogram  04-18-2014    mild LVH/  ef 43-32%/  grade I diastolic dysfunction/  mild LAE/  trivial MR and TR  . Dilatation & currettage/hysteroscopy with resectocope N/A 05/16/2014    Procedure: DILATATION & CURETTAGE/HYSTEROSCOPY WITH RESECTOCOPE;  Surgeon: Arvella Nigh, MD;  Location: [facility];  Service: Gynecology;  Laterality: N/A;  . Laparoscopy N/A 05/16/2014    Procedure: LAPAROSCOPY DIAGNOSTIC;  Surgeon: Arvella Nigh, MD;  Location: [facility];  Service: Gynecology;  Laterality: N/A;    Allergies  Allergen Reactions  . Ativan [Lorazepam] Other (See Comments)    confusion  . Codeine Nausea And Vomiting  . Sulfa Antibiotics Other (See Comments)    Unknown childhood reaction      Medication List       This list is accurate as of: 11/14/14  6:27 PM.  Always use your most recent med list.               ALPRAZolam 0.25 MG tablet  Commonly known as:  XANAX  Take 0.25 mg by mouth 2 (two) times daily as needed for anxiety.     Calcium Carbonate-Vitamin D 600-400 MG-UNIT tablet  Take 1 tablet by mouth daily.     CENTRUM SILVER ADULT 50+ PO  Take 1 tablet by mouth daily.     cholecalciferol 1000 UNITS tablet  Commonly known as:  VITAMIN D  Take 4,000 Units by mouth at bedtime.     cyclobenzaprine 5 MG tablet  Commonly known as:  FLEXERIL  Take 1  tablet (5 mg total) by mouth 3 (three) times daily as needed (tension headache).     FUSION PO  Take 1 tablet by mouth daily.     hydroxyurea 500 MG capsule  Commonly known as:  HYDREA  Take 500 mg by mouth daily. May take one tablet in pm  with food to minimize GI side effects.     levofloxacin 500 MG tablet  Commonly known as:  LEVAQUIN  Take 1 tablet (500 mg total) by mouth daily.     mirtazapine 15 MG tablet  Commonly known as:  REMERON  Take 1 tablet (15 mg total) by mouth at bedtime.     OVER THE COUNTER MEDICATION  Take 1 tablet by mouth at bedtime. Macular supplement     sertraline 50 MG tablet  Commonly known as:  ZOLOFT  Take 1 tablet (50 mg total) by mouth daily.        Health Maintenance  Topic Date Due  . PNA vac Low Risk Adult (2 of 2 - PCV13) 01/27/2004  . MAMMOGRAM  12/09/2014  . INFLUENZA VACCINE  08/27/2015  . TETANUS/TDAP  01/27/2024  . DEXA SCAN  Completed  . ZOSTAVAX  Completed    Physical Exam: Filed Vitals:   11/14/14 1534  BP: 112/52  Pulse: 108  Temp: 100.7 F (38.2 C)  TempSrc: Oral  Weight: 128 lb (58.06 kg)    SpO2: 96%   Body mass index is 21.3 kg/(m^2). Physical Exam  Constitutional: She is oriented to person, place, and time.  Increasingly frail white female seated in wheelchair; warm to touch  HENT:  Head: Normocephalic and atraumatic.  Neck: No JVD present.  Cardiovascular: Regular rhythm and normal heart sounds.   tachy  Pulmonary/Chest: She has no wheezes. She has rales. She exhibits no tenderness.  Increased respiratory effort, bilateral rales  Abdominal: Soft. Bowel sounds are normal. She exhibits no distension. There is no tenderness.  Musculoskeletal: Normal range of motion. She exhibits no edema.  Lymphadenopathy:    She has no cervical adenopathy.  Neurological: She is alert and oriented to person, place, and time.  Skin:  Skin tenting and poor capillary refill  Psychiatric: She has a normal mood and affect.    Labs reviewed: Basic Metabolic Panel:  Recent Labs  01/25/14 0643  02/07/14 0900 02/08/14 0515 02/09/14 0526  10/22/14 1424 10/26/14 0825 10/31/14 1309  NA 140  < > 136 140 137  < > 139 141 140  K 4.1  < > 3.3* 3.8 3.6  < > 3.3* 4.1 4.0  CL 112  < > 105 105 105  --   --   --   --   CO2 23  < > 25 26 26   < > 25 21* 25  GLUCOSE 117*  < > 104* 88 91  < > 142* 127 132  BUN 13  < > 10 9 9   < > 12.1 13.2 20.1  CREATININE 0.63  < > 0.60 0.68 0.66  < > 0.8 0.8 0.8  CALCIUM 7.8*  < > 8.1* 8.5 8.3*  < > 9.1 9.5 9.4  MG 1.8  --  1.8 2.2  --   --   --   --   --   PHOS 3.2  --   --   --   --   --   --   --   --   TSH 4.055  --   --   --   --   --   --   --   --   < > =  values in this interval not displayed. Liver Function Tests:  Recent Labs  10/22/14 1424 10/26/14 0825 10/31/14 1309  AST 19 20 14   ALT 22 26 17   ALKPHOS 88 79 64  BILITOT 0.68 0.65 0.68  PROT 6.7 6.4 6.4  ALBUMIN 3.9 3.6 3.8   No results for input(s): LIPASE, AMYLASE in the last 8760 hours. No results for input(s): AMMONIA in the last 8760 hours. CBC:  Recent Labs  10/31/14 1308  11/07/14 0937 11/12/14 1413  WBC 44.8* 14.0* 48.6*  NEUTROABS 40.1* 11.3* 34.8*  HGB 10.8* 8.8* 8.9*  HCT 32.6* 27.2* 27.8*  MCV 82.3 84.0 83.9  PLT 76 Large & giant platelets* 37* 53*    Assessment/Plan 1. HCAP (healthcare-associated pneumonia) - will give first dose of levaquin IV, then po x 6 days -IVF NS at 100cc/hr x 2 liters -VS q shift while awake -pCXR in am -cbc, bmp, liver panel, bnp, ldh in am - levofloxacin (LEVAQUIN) 500 MG tablet; Take 1 tablet (500 mg total) by mouth daily.  Dispense: 7 tablet; Refill: 0  2. Hypereosinophilic syndrome -known and undergoing treatment with hydrea -Dr. Burr Medico has recommended an opinion at [facility] on this -we discussed only continuing treatments that make her feel better and improve qol at this point -MOST completed  3. Pleural effusion, bilateral -small per CT 2 days ago -she has bilateral rales and a dry cough, now fever and signs of early sepsis -treat for pneumonia as above  4. Persistent dry cough -suspect due to bilateral pleural effusions--pt otherwise appears very dry so suspect these may be infectious given fever  -not responding to hydrocodone cough syrup  5. FTT (failure to thrive) in adult -is declining  -seems that hydrea should only cause the nausea not all of her other signs of decline--seems to have splenic sequestration -does she have another hematologic process now beside the hypereosinophilia making her go downhill like this?   -goals are more for comfort at this time when discussed with pt, her husband and her daughter today -they agree to a rehab stay for fluids, hydration, tx of pneumonia and therapy as she tolerates -check labs in am and see if she needs any additional vitamin repletion -cont boost breeze bid per RD--ok to d/c po vitamins if contributing to nausea and loss of appetite  6. Loss of weight -has been ongoing with poor intake, has declined dramatically and prognosis is poor at this time -see  #5  7. Headache, unspecified headache type -seems tension vs. Sinus related -will try her on a muscle relaxant to decrease tension -h/o gi bleed with her colon cancer so would avoid nsaids -continue tylenol if needed  -hydrocodone was ineffective  8. Moderate episode of recurrent major depressive disorder ([place]) -her husband notes that her spirits have worsened -will resume the zoloft along with her mirtazapine and monitor  Labs/tests ordered:  Labs in am as above Next appt:  Will see in rehab next week and ask NP to check on her tomorrow when labs and xray return  [place]. Kalan Rinn, D.O. [facility] [address], [place] [address] Cell Phone (Mon-Fri 8am-5pm):  [phone] On Call:  [phone] & follow prompts after 5pm & weekends Office Phone:  [phone] Office Fax:  [phone]

## 2014-11-14 NOTE — Telephone Encounter (Signed)
Pt called and left message requesting to talk to nurse about coughing.  Called pt back and spoke with daughter [name].  [name] stated pt had taken Hycodan but the cough is getting worse.  Pt is seeing her primary md today.  Dr. Burr Medico notified.

## 2014-11-15 ENCOUNTER — Ambulatory Visit (HOSPITAL_COMMUNITY)
Admission: RE | Admit: 2014-11-15 | Discharge: 2014-11-15 | Disposition: A | Discharge status: Home / Self Care | Payer: Medicare Other | Source: Ambulatory Visit | Attending: Hematology | Admitting: Hematology

## 2014-11-15 ENCOUNTER — Non-Acute Institutional Stay (SKILLED_NURSING_FACILITY): Payer: Medicare Other | Admitting: Adult Health

## 2014-11-15 ENCOUNTER — Telehealth: Payer: Self-pay | Admitting: Hematology

## 2014-11-15 DIAGNOSIS — K909 Intestinal malabsorption, unspecified: Secondary | ICD-10-CM | POA: Diagnosis not present

## 2014-11-15 DIAGNOSIS — E860 Dehydration: Secondary | ICD-10-CM | POA: Diagnosis not present

## 2014-11-15 DIAGNOSIS — J90 Pleural effusion, not elsewhere classified: Secondary | ICD-10-CM | POA: Diagnosis not present

## 2014-11-15 DIAGNOSIS — J189 Pneumonia, unspecified organism: Secondary | ICD-10-CM

## 2014-11-15 DIAGNOSIS — E876 Hypokalemia: Secondary | ICD-10-CM

## 2014-11-15 DIAGNOSIS — R197 Diarrhea, unspecified: Secondary | ICD-10-CM | POA: Diagnosis not present

## 2014-11-15 DIAGNOSIS — D721 Eosinophilia: Secondary | ICD-10-CM | POA: Insufficient documentation

## 2014-11-15 DIAGNOSIS — R627 Adult failure to thrive: Secondary | ICD-10-CM

## 2014-11-15 DIAGNOSIS — D72119 Hypereosinophilic syndrome (hes), unspecified: Secondary | ICD-10-CM

## 2014-11-15 LAB — BASIC METABOLIC PANEL
BUN: 10 mg/dL (ref 4–21)
CREATININE: 0.6 mg/dL (ref 0.5–1.1)
GLUCOSE: 135 mg/dL
POTASSIUM: 3.2 mmol/L — AB (ref 3.4–5.3)
SODIUM: 138 mmol/L (ref 137–147)

## 2014-11-15 LAB — CBC AND DIFFERENTIAL
HEMATOCRIT: 21 % — AB (ref 36–46)
HEMOGLOBIN: 7.4 g/dL — AB (ref 12.0–16.0)
Platelets: 35 10*3/uL — AB (ref 150–399)
WBC: 68.7 10^3/mL

## 2014-11-15 LAB — LIPID PANEL
Cholesterol: 58 mg/dL (ref 0–200)
HDL: 5 mg/dL — AB (ref 35–70)
LDL Cholesterol: 27 mg/dL
Triglycerides: 128 mg/dL (ref 40–160)

## 2014-11-15 NOTE — Telephone Encounter (Signed)
I received her lab result and note from her PCP Dr. Mariea Clonts. She was admitted to the rehabilitation center for pneumonia, is currently being treated with antibiotics. She also received IV fluids. Hemoglobin down to 7.4 today, with white count 60.7 K, platelet 35K.   I spoke with Dr. Mariea Clonts about her condition, and told her to stop her Hydrea and get a CBC with differential daily. I discussed repeating a bone marrow biopsy with her daughter [name], she prefers to wait and discuss on her next  follow-up appointment with me on Monday.  Truitt Merle  11/15/2014

## 2014-11-15 NOTE — Progress Notes (Signed)
Patient ID: April [name], female   DOB: 1925-09-04, 81 y.o.   MRN: [id number]   Location: [facility]  Code Status: DNR   Goals of Care: Advanced Directive information  MOST done today:  DNR, limited additional interventions (considering do not hospitalize, but trying to manage symptoms here), no feeding tube, IVF and abx short term only  Chief Complaint  Patient presents with  . Acute Visit    f/u pna, labs , cxr    HPI: 81 y.o. female residing at [facility], rehab section. She has a hx of hypereosinophilic syndrome, colon cancer s/p resection, FTT, headaches, anxiety, depression, GIB, chronic diarrhea, and myeloproliferative neoplasm. She was admitted to rehab on 11/14/14 due to fever, chills, SOB, cough, and weakness. She was diagnosed with HCAP and started on levaquin. Her CXR 11/15/2014 revealed bilateral small pleural effusions but no pneumonia. She was dehydrated on admission and was given 2L of fluid. She reports feeling better today, with less effort to breath, no fever, and increased strength. She remains with a cough without purulent sputum. She has not required oxygen and her VS have remained stable.   She is currently followed by oncology for hypereosinophilia syndrome and her WBC today was 68, HGb 7.4, and plts of 35.  She denies any bleeding. She denies headaches today as well. She is currently receiving Hydrea for this issue.   She had a restaging CT of her abdomen pelvis and chest on 11/12/14  which revealed bilateral small pleural effusions, splenomegaly, but no metastatic disease.    The staff reports that she has had several loose stools but no abd pain or fever. She has a hx of chronic diarrhea and has tried [place] before for this issue.    Review of Systems:  Review of Systems  Constitutional: Positive for weight loss and malaise/fatigue. Negative for fever and chills.  HENT: Positive for hearing loss. Negative for congestion.   Eyes:  Negative for blurred vision.  Respiratory: Positive for cough. Negative for hemoptysis, sputum production, shortness of breath and wheezing.   Cardiovascular: Negative for chest pain and palpitations.  Gastrointestinal: Negative for abdominal pain, constipation, blood in stool and melena.  Genitourinary: Negative for dysuria.  Musculoskeletal: Negative for falls.  Skin: Negative for rash.  Neurological: Positive for weakness. Negative for dizziness.  Endo/Heme/Allergies: Bruises/bleeds easily.  Psychiatric/Behavioral: Positive for depression. Negative for memory loss. The patient is nervous/anxious.     Past Medical History  Diagnosis Date  . History of breast cancer onocologist--  dr Jana Hakim--  no recurrence    dx 2011-- DCIS, Right breast,  ER/PR positive --- s/p lumpectomy  No radiation, took Aromasen 2012  . Senile osteoporosis     Took Fosamax x15years  . GERD (gastroesophageal reflux disease)   . Sigmoid diverticulosis   . Wears hearing aid     bilateral  . PMB (postmenopausal bleeding)   . Colon cancer [facility]) oncologist--  dr Truitt Merle    dx jan 2016  --  Ascending colon carcinoma,  Stage IIIB,  pT3 N1a M0,  Grade 2,  MSI,  1 of 15 nodes +//   s/p  right colectomy  . Hypereosinophilic syndrome   . Chronic diarrhea     Past Surgical History  Procedure Laterality Date  . Cataract extraction w/ intraocular lens  implant, bilateral Bilateral [id number]  . Tonsillectomy and adenoidectomy  1930  . Colonoscopy N/A 01/28/2014    Procedure: COLONOSCOPY;  Surgeon: Missy Sabins, MD;  Location: [facility];  Service: Endoscopy;  Laterality: N/A;  . Esophagogastroduodenoscopy N/A 01/28/2014    Procedure: ESOPHAGOGASTRODUODENOSCOPY (EGD);  Surgeon: Missy Sabins, MD;  Location: [facility];  Service: Endoscopy;  Laterality: N/A;  . Laparoscopic partial right colectomy Right 01/30/2014    Procedure: LAPAROSCOPIC PARTIAL RIGHT COLECTOMY;  Surgeon: Pedro Earls, MD;  Location: [facility];   Service: General;  Laterality: Right;  . Laparotomy N/A 01/31/2014    Procedure: EXPLORATORY LAPAROTOMY, RESECTION OF PRIOR SMALL BOWEL ANASTOMOSIS, UPPER ENDOSCOPY, CREATED A  NEW ILEO COLOSTOMY;  Surgeon: Pedro Earls, MD;  Location: [facility];  Service: General;  Laterality: N/A;  . Laparoscopic cholecystectomy  05-26-2009  . Breast lumpectomy Right 12-24-2009  . Transthoracic echocardiogram  04-18-2014    mild LVH/  ef 28-36%/  grade I diastolic dysfunction/  mild LAE/  trivial MR and TR  . Dilatation & currettage/hysteroscopy with resectocope N/A 05/16/2014    Procedure: DILATATION & CURETTAGE/HYSTEROSCOPY WITH RESECTOCOPE;  Surgeon: Arvella Nigh, MD;  Location: [facility];  Service: Gynecology;  Laterality: N/A;  . Laparoscopy N/A 05/16/2014    Procedure: LAPAROSCOPY DIAGNOSTIC;  Surgeon: Arvella Nigh, MD;  Location: [facility];  Service: Gynecology;  Laterality: N/A;    Allergies  Allergen Reactions  . Ativan [Lorazepam] Other (See Comments)    confusion  . Codeine Nausea And Vomiting  . Sulfa Antibiotics Other (See Comments)    Unknown childhood reaction      Medication List       This list is accurate as of: 11/15/14  3:15 PM.  Always use your most recent med list.               ALPRAZolam 0.25 MG tablet  Commonly known as:  XANAX  Take 0.25 mg by mouth 2 (two) times daily as needed for anxiety.     Calcium Carbonate-Vitamin D 600-400 MG-UNIT tablet  Take 1 tablet by mouth daily.     CENTRUM SILVER ADULT 50+ PO  Take 1 tablet by mouth daily.     cholecalciferol 1000 UNITS tablet  Commonly known as:  VITAMIN D  Take 4,000 Units by mouth at bedtime.     cyclobenzaprine 5 MG tablet  Commonly known as:  FLEXERIL  Take 1 tablet (5 mg total) by mouth 3 (three) times daily as needed (tension headache).     FUSION PO  Take 1 tablet by mouth daily.     hydroxyurea 500 MG capsule  Commonly known as:  HYDREA  Take 500 mg by mouth daily.  May take one tablet in pm  with food to minimize GI side effects.     levofloxacin 500 MG tablet  Commonly known as:  LEVAQUIN  Take 1 tablet (500 mg total) by mouth daily.     mirtazapine 15 MG tablet  Commonly known as:  REMERON  Take 1 tablet (15 mg total) by mouth at bedtime.     OVER THE COUNTER MEDICATION  Take 1 tablet by mouth at bedtime. Macular supplement     sertraline 50 MG tablet  Commonly known as:  ZOLOFT  Take 1 tablet (50 mg total) by mouth daily.        Health Maintenance  Topic Date Due  . PNA vac Low Risk Adult (2 of 2 - PCV13) 01/27/2004  . MAMMOGRAM  12/09/2014  . INFLUENZA VACCINE  08/27/2015  . TETANUS/TDAP  01/27/2024  . DEXA SCAN  Completed  . ZOSTAVAX  Completed    Physical  Exam: There were no vitals filed for this visit. There is no weight on file to calculate BMI. Physical Exam  Constitutional: She is oriented to person, place, and time. No distress.  HENT:  Head: Normocephalic and atraumatic.  Neck: No JVD present.  Cardiovascular: Normal rate, regular rhythm and normal heart sounds.   Trace edema to feet  Pulmonary/Chest: Effort normal. She has no wheezes. She has rales. She exhibits no tenderness.  Bibasilar rales, fine  Abdominal: Soft. Bowel sounds are normal. She exhibits no distension. There is no tenderness.  Musculoskeletal: Normal range of motion. She exhibits no edema.  Lymphadenopathy:    She has no cervical adenopathy.  Neurological: She is alert and oriented to person, place, and time.  Skin: Skin is warm and dry. She is not diaphoretic.  Psychiatric: She has a normal mood and affect.    Labs reviewed: Basic Metabolic Panel:  Recent Labs  01/25/14 0643  02/07/14 0900 02/08/14 0515 02/09/14 0526  10/22/14 1424 10/26/14 0825 10/31/14 1309 11/15/14  NA 140  < > 136 140 137  < > 139 141 140 138  K 4.1  < > 3.3* 3.8 3.6  < > 3.3* 4.1 4.0 3.2*  CL 112  < > 105 105 105  --   --   --   --   --   CO2 23  < > 25 26 26    < > 25 21* 25  --   GLUCOSE 117*  < > 104* 88 91  < > 142* 127 132  --   BUN 13  < > 10 9 9   < > 12.1 13.2 20.1 10  CREATININE 0.63  < > 0.60 0.68 0.66  < > 0.8 0.8 0.8 0.6  CALCIUM 7.8*  < > 8.1* 8.5 8.3*  < > 9.1 9.5 9.4  --   MG 1.8  --  1.8 2.2  --   --   --   --   --   --   PHOS 3.2  --   --   --   --   --   --   --   --   --   TSH 4.055  --   --   --   --   --   --   --   --   --   < > = values in this interval not displayed. Liver Function Tests:  Recent Labs  10/22/14 1424 10/26/14 0825 10/31/14 1309  AST 19 20 14   ALT 22 26 17   ALKPHOS 88 79 64  BILITOT 0.68 0.65 0.68  PROT 6.7 6.4 6.4  ALBUMIN 3.9 3.6 3.8   No results for input(s): LIPASE, AMYLASE in the last 8760 hours. No results for input(s): AMMONIA in the last 8760 hours. CBC:  Recent Labs  10/31/14 1308 11/07/14 0937 11/12/14 1413 11/15/14  WBC 44.8* 14.0* 48.6* 68.7  NEUTROABS 40.1* 11.3* 34.8*  --   HGB 10.8* 8.8* 8.9* 7.4*  HCT 32.6* 27.2* 27.8* 21*  MCV 82.3 84.0 83.9  --   PLT 76 Large & giant platelets* 37* 53* 35*    Assessment/Plan  1. HCAP (healthcare-associated pneumonia) -improved per pt with less effort to breath -currently on levaquin with loose stools but she reports that she has chronic diarrhea (no change from baseline) -no temp and all VS are normal including 02 sats -CXR was negative but given her symptoms and rales on exam she most likely has pneumonia -continue Levaquin at  this time  2. Hypereosinophilic syndrome -known and undergoing treatment with hydrea -WBC 68, previously as high as 100.  -labs faxed to oncology, f/u apt Monday -will need CBC on Monday to check this plus low Hgb/plts  3. Pleural effusion, bilateral -small per cxr today with a normal BNP, -continue to monitor, will need a f/u cxr  4. Hypokalemia -40 meq of Kdur today -recheck on 10/24  5. FTT -continue Remeron, monitor weight -working with dietary on nutritional supplements  6. Chronic  diarrhea -due to previous hx of colon ca s/p resection -continue to monitor for s/s of infection  7. Dehydration -improved per BMP -NSL IV and encourage fluids    Cindi Carbon, ANP [facility] [phone]

## 2014-11-16 ENCOUNTER — Non-Acute Institutional Stay (SKILLED_NURSING_FACILITY): Payer: Medicare Other | Admitting: Adult Health

## 2014-11-16 ENCOUNTER — Ambulatory Visit (HOSPITAL_COMMUNITY): Admission: RE | Admit: 2014-11-16 | Payer: Medicare Other | Source: Ambulatory Visit

## 2014-11-16 ENCOUNTER — Encounter (HOSPITAL_COMMUNITY): Payer: Medicare Other

## 2014-11-16 DIAGNOSIS — D721 Eosinophilia: Secondary | ICD-10-CM | POA: Diagnosis not present

## 2014-11-16 DIAGNOSIS — J189 Pneumonia, unspecified organism: Secondary | ICD-10-CM

## 2014-11-16 DIAGNOSIS — D72119 Hypereosinophilic syndrome (hes), unspecified: Secondary | ICD-10-CM

## 2014-11-16 NOTE — Progress Notes (Signed)
Patient ID: Duayne Cal Noguera, female   DOB: 03-Apr-1925, 81 y.o.   MRN: [id number]   Location: [facility]  Code Status: DNR   Goals of Care: Advanced Directive information  MOST done today:  DNR, limited additional interventions (considering do not hospitalize, but trying to manage symptoms here), no feeding tube, IVF and abx short term only  Chief Complaint  Patient presents with  . Acute Visit    f/u cough    HPI: 81 y.o. female residing at [facility], rehab section. She has a hx of hypereosinophilic syndrome, colon cancer s/p resection, FTT, headaches, anxiety, depression, GIB, chronic diarrhea, and myeloproliferative neoplasm. She was admitted to rehab on 11/14/14 due to fever, chills, SOB, cough, and weakness. She was diagnosed with HCAP and started on levaquin. Her CXR 10/20  revealed bilateral small pleural effusions but no pneumonia. She was dehydrated on admission and was given 2L of fluid.  She remains with a cough without purulent sputum. She has not required oxygen and her VS have remained stable.  She wants to start working with PT to gain strength. She reports feeling better than the previous day. She did have a temp of 101 at 6p on 10/20 and then felt sweats and chills that night while in bed.  She is currently followed by oncology for hypereosinophilia syndrome and her WBC today was 68, HGb 7.4, and plts of 35.  She denies any bleeding. She denies headaches today as well. Hydrea was discontinued yesterday evening by oncology. CBC daily with manual diff ordered. Unfortunately she has been a difficult stick (4-5x for each draw).  There is concern for leukemia.   She had a restaging CT of her abdomen pelvis and chest on 11/12/14  which revealed bilateral small pleural effusions, splenomegaly, but no metastatic disease.       Review of Systems:  Review of Systems  Constitutional: Positive for fever, chills, weight loss, malaise/fatigue and diaphoresis.   HENT: Positive for hearing loss. Negative for congestion.   Eyes: Negative for blurred vision.  Respiratory: Positive for cough. Negative for hemoptysis, sputum production, shortness of breath and wheezing.   Cardiovascular: Negative for chest pain and palpitations.  Gastrointestinal: Negative for abdominal pain, constipation, blood in stool and melena.  Genitourinary: Negative for dysuria.  Musculoskeletal: Negative for falls.  Skin: Negative for rash.  Neurological: Positive for weakness. Negative for dizziness.  Endo/Heme/Allergies: Bruises/bleeds easily.  Psychiatric/Behavioral: Positive for depression. Negative for memory loss. The patient is nervous/anxious.     Past Medical History  Diagnosis Date  . History of breast cancer onocologist--  dr Jana Hakim--  no recurrence    dx 2011-- DCIS, Right breast,  ER/PR positive --- s/p lumpectomy  No radiation, took Aromasen 2012  . Senile osteoporosis     Took Fosamax x15years  . GERD (gastroesophageal reflux disease)   . Sigmoid diverticulosis   . Wears hearing aid     bilateral  . PMB (postmenopausal bleeding)   . Colon cancer [facility]) oncologist--  dr Truitt Merle    dx jan 2016  --  Ascending colon carcinoma,  Stage IIIB,  pT3 N1a M0,  Grade 2,  MSI,  1 of 15 nodes +//   s/p  right colectomy  . Hypereosinophilic syndrome   . Chronic diarrhea     Past Surgical History  Procedure Laterality Date  . Cataract extraction w/ intraocular lens  implant, bilateral Bilateral [id number]  . Tonsillectomy and adenoidectomy  1930  . Colonoscopy N/A 01/28/2014  Procedure: COLONOSCOPY;  Surgeon: Missy Sabins, MD;  Location: [facility];  Service: Endoscopy;  Laterality: N/A;  . Esophagogastroduodenoscopy N/A 01/28/2014    Procedure: ESOPHAGOGASTRODUODENOSCOPY (EGD);  Surgeon: Missy Sabins, MD;  Location: [facility];  Service: Endoscopy;  Laterality: N/A;  . Laparoscopic partial right colectomy Right 01/30/2014    Procedure: LAPAROSCOPIC PARTIAL RIGHT  COLECTOMY;  Surgeon: Pedro Earls, MD;  Location: [facility];  Service: General;  Laterality: Right;  . Laparotomy N/A 01/31/2014    Procedure: EXPLORATORY LAPAROTOMY, RESECTION OF PRIOR SMALL BOWEL ANASTOMOSIS, UPPER ENDOSCOPY, CREATED A  NEW ILEO COLOSTOMY;  Surgeon: Pedro Earls, MD;  Location: [facility];  Service: General;  Laterality: N/A;  . Laparoscopic cholecystectomy  05-26-2009  . Breast lumpectomy Right 12-24-2009  . Transthoracic echocardiogram  04-18-2014    mild LVH/  ef 74-12%/  grade I diastolic dysfunction/  mild LAE/  trivial MR and TR  . Dilatation & currettage/hysteroscopy with resectocope N/A 05/16/2014    Procedure: DILATATION & CURETTAGE/HYSTEROSCOPY WITH RESECTOCOPE;  Surgeon: Arvella Nigh, MD;  Location: [place];  Service: Gynecology;  Laterality: N/A;  . Laparoscopy N/A 05/16/2014    Procedure: LAPAROSCOPY DIAGNOSTIC;  Surgeon: Arvella Nigh, MD;  Location: [facility];  Service: Gynecology;  Laterality: N/A;    Allergies  Allergen Reactions  . Ativan [Lorazepam] Other (See Comments)    confusion  . Codeine Nausea And Vomiting  . Sulfa Antibiotics Other (See Comments)    Unknown childhood reaction      Medication List       This list is accurate as of: 11/16/14 10:33 AM.  Always use your most recent med list.               ALPRAZolam 0.25 MG tablet  Commonly known as:  XANAX  Take 0.25 mg by mouth 2 (two) times daily as needed for anxiety.     Calcium Carbonate-Vitamin D 600-400 MG-UNIT tablet  Take 1 tablet by mouth daily.     CENTRUM SILVER ADULT 50+ PO  Take 1 tablet by mouth daily.     cholecalciferol 1000 UNITS tablet  Commonly known as:  VITAMIN D  Take 4,000 Units by mouth at bedtime.     cyclobenzaprine 5 MG tablet  Commonly known as:  FLEXERIL  Take 1 tablet (5 mg total) by mouth 3 (three) times daily as needed (tension headache).     FUSION PO  Take 1 tablet by mouth daily.     ipratropium-albuterol  0.5-2.5 (3) MG/3ML Soln  Commonly known as:  DUONEB  Take 3 mLs by nebulization 2 (two) times daily. And q 6 prn sob or cough     levofloxacin 500 MG tablet  Commonly known as:  LEVAQUIN  Take 1 tablet (500 mg total) by mouth daily.     mirtazapine 15 MG tablet  Commonly known as:  REMERON  Take 1 tablet (15 mg total) by mouth at bedtime.     OVER THE COUNTER MEDICATION  Take 1 tablet by mouth at bedtime. Macular supplement     saccharomyces boulardii 250 MG capsule  Commonly known as:  FLORASTOR  Take 250 mg by mouth 2 (two) times daily.     sertraline 50 MG tablet  Commonly known as:  ZOLOFT  Take 1 tablet (50 mg total) by mouth daily.        Health Maintenance  Topic Date Due  . PNA vac Low Risk Adult (2 of 2 - PCV13)  01/27/2004  . MAMMOGRAM  12/09/2014  . INFLUENZA VACCINE  08/27/2015  . TETANUS/TDAP  01/27/2024  . DEXA SCAN  Completed  . ZOSTAVAX  Completed    Physical Exam: Filed Vitals:   11/16/14 1027  BP: 106/63  Pulse: 87  Temp: 96.2 F (35.7 C)  Resp: 20  SpO2: 98%   There is no weight on file to calculate BMI. Physical Exam  Constitutional: She is oriented to person, place, and time. No distress.  HENT:  Head: Normocephalic and atraumatic.  Neck: No JVD present.  Cardiovascular: Normal rate, regular rhythm and normal heart sounds.   Trace edema to feet  Pulmonary/Chest: Effort normal. She has no wheezes. She has rales. She exhibits no tenderness.  Bibasilar rales, fine  Abdominal: Soft. Bowel sounds are normal. She exhibits no distension. There is no tenderness.  Musculoskeletal: Normal range of motion. She exhibits no edema.  Lymphadenopathy:    She has no cervical adenopathy.  Neurological: She is alert and oriented to person, place, and time.  Skin: Skin is warm and dry. She is not diaphoretic.  Psychiatric: She has a normal mood and affect.    Labs reviewed: Basic Metabolic Panel:  Recent Labs  01/25/14 0643  02/07/14 0900  02/08/14 0515 02/09/14 0526  10/22/14 1424 10/26/14 0825 10/31/14 1309 11/15/14  NA 140  < > 136 140 137  < > 139 141 140 138  K 4.1  < > 3.3* 3.8 3.6  < > 3.3* 4.1 4.0 3.2*  CL 112  < > 105 105 105  --   --   --   --   --   CO2 23  < > 25 26 26   < > 25 21* 25  --   GLUCOSE 117*  < > 104* 88 91  < > 142* 127 132  --   BUN 13  < > 10 9 9   < > 12.1 13.2 20.1 10  CREATININE 0.63  < > 0.60 0.68 0.66  < > 0.8 0.8 0.8 0.6  CALCIUM 7.8*  < > 8.1* 8.5 8.3*  < > 9.1 9.5 9.4  --   MG 1.8  --  1.8 2.2  --   --   --   --   --   --   PHOS 3.2  --   --   --   --   --   --   --   --   --   TSH 4.055  --   --   --   --   --   --   --   --   --   < > = values in this interval not displayed. Liver Function Tests:  Recent Labs  10/22/14 1424 10/26/14 0825 10/31/14 1309  AST 19 20 14   ALT 22 26 17   ALKPHOS 88 79 64  BILITOT 0.68 0.65 0.68  PROT 6.7 6.4 6.4  ALBUMIN 3.9 3.6 3.8   No results for input(s): LIPASE, AMYLASE in the last 8760 hours. No results for input(s): AMMONIA in the last 8760 hours. CBC:  Recent Labs  10/31/14 1308 11/07/14 0937 11/12/14 1413 11/15/14  WBC 44.8* 14.0* 48.6* 68.7  NEUTROABS 40.1* 11.3* 34.8*  --   HGB 10.8* 8.8* 8.9* 7.4*  HCT 32.6* 27.2* 27.8* 21*  MCV 82.3 84.0 83.9  --   PLT 76 Large & giant platelets* 37* 53* 35*    Assessment/Plan  1. HCAP (healthcare-associated pneumonia) -improved per pt but with  a continued cough -currently on levaquin no diarrhea today -fever may be due to a hematologic process rather than pna, continue to monitor -CXR was negative, will order a f/u next week -continue Levaquin at this time -Duoneb BID and q 6 hrs prn sob or cough  2. Hypereosinophilic syndrome -known and followed by oncology, Hydrea stopped -worsening counts, f/u with oncology on Monday to evaluate the need for further intervention -continue with order for CBC daily but I have asked the staff not to stick her more than twice, and if they are unable  to obtain labs via venipuncture may try finger sticks  3. FTT -continue Remeron -begin PT -followed by dietary  I spoke with the resident and her daughter. They do not wish to pursue a work up and/or tx that will hinder her quality of life at her age. They are aware that her blood work is worrisome but given her age and previous response to therapy, they are hesitant to continue down this path. They will discuss their concerns with her oncologist on Monday 10/24    Cindi Carbon, [facility] [phone]

## 2014-11-19 ENCOUNTER — Other Ambulatory Visit (HOSPITAL_BASED_OUTPATIENT_CLINIC_OR_DEPARTMENT_OTHER): Payer: Medicare Other

## 2014-11-19 ENCOUNTER — Telehealth: Payer: Self-pay | Admitting: Hematology

## 2014-11-19 ENCOUNTER — Ambulatory Visit: Payer: Medicare Other

## 2014-11-19 ENCOUNTER — Ambulatory Visit (HOSPITAL_BASED_OUTPATIENT_CLINIC_OR_DEPARTMENT_OTHER): Payer: Medicare Other | Admitting: Hematology

## 2014-11-19 VITALS — BP 112/43 | HR 106 | Temp 98.3°F | Resp 18 | Ht 65.0 in | Wt 126.5 lb

## 2014-11-19 DIAGNOSIS — F329 Major depressive disorder, single episode, unspecified: Secondary | ICD-10-CM | POA: Diagnosis not present

## 2014-11-19 DIAGNOSIS — Z853 Personal history of malignant neoplasm of breast: Secondary | ICD-10-CM

## 2014-11-19 DIAGNOSIS — D721 Eosinophilia: Secondary | ICD-10-CM

## 2014-11-19 DIAGNOSIS — D696 Thrombocytopenia, unspecified: Secondary | ICD-10-CM

## 2014-11-19 DIAGNOSIS — R5383 Other fatigue: Secondary | ICD-10-CM | POA: Diagnosis not present

## 2014-11-19 DIAGNOSIS — R630 Anorexia: Secondary | ICD-10-CM

## 2014-11-19 DIAGNOSIS — D72119 Hypereosinophilic syndrome (hes), unspecified: Secondary | ICD-10-CM

## 2014-11-19 DIAGNOSIS — R51 Headache: Secondary | ICD-10-CM

## 2014-11-19 DIAGNOSIS — C182 Malignant neoplasm of ascending colon: Secondary | ICD-10-CM

## 2014-11-19 DIAGNOSIS — D471 Chronic myeloproliferative disease: Secondary | ICD-10-CM | POA: Diagnosis present

## 2014-11-19 DIAGNOSIS — R05 Cough: Secondary | ICD-10-CM | POA: Diagnosis not present

## 2014-11-19 DIAGNOSIS — D649 Anemia, unspecified: Secondary | ICD-10-CM

## 2014-11-19 LAB — MANUAL DIFFERENTIAL
ALC: 4.6 10*3/uL — ABNORMAL HIGH (ref 0.9–3.3)
ANC (CHCC MAN DIFF): 136.5 10*3/uL — AB (ref 1.5–6.5)
BASOPHIL: 0 % (ref 0–2)
Band Neutrophils: 15 % — ABNORMAL HIGH (ref 0–10)
Blasts: 2 % — ABNORMAL HIGH (ref 0–0)
EOS: 0 % (ref 0–7)
LYMPH: 3 % — ABNORMAL LOW (ref 14–49)
METAMYELOCYTES PCT: 13 % — AB (ref 0–0)
MONO: 5 % (ref 0–14)
Myelocytes: 22 % — ABNORMAL HIGH (ref 0–0)
NRBC: 0 % (ref 0–0)
Other Cell: 0 % (ref 0–0)
PLT EST: DECREASED
PROMYELO: 0 % (ref 0–0)
SEG: 40 % (ref 38–77)
Variant Lymph: 0 % (ref 0–0)

## 2014-11-19 LAB — CBC WITH DIFFERENTIAL/PLATELET
HCT: 22.7 % — ABNORMAL LOW (ref 34.8–46.6)
HGB: 7.1 g/dL — ABNORMAL LOW (ref 11.6–15.9)
MCH: 26.9 pg (ref 25.1–34.0)
MCHC: 31.4 g/dL — ABNORMAL LOW (ref 31.5–36.0)
MCV: 85.6 fL (ref 79.5–101.0)
Platelets: 33 10*3/uL — ABNORMAL LOW (ref 145–400)
RBC: 2.65 10*6/uL — AB (ref 3.70–5.45)
RDW: 22.7 % — AB (ref 11.2–14.5)
WBC: 151.7 10*3/uL (ref 3.9–10.3)

## 2014-11-19 LAB — PREPARE RBC (CROSSMATCH)

## 2014-11-19 NOTE — Telephone Encounter (Signed)
Patient sent back to lab and given avs report and appointments for November.

## 2014-11-20 ENCOUNTER — Telehealth: Payer: Self-pay | Admitting: *Deleted

## 2014-11-20 ENCOUNTER — Encounter: Payer: Self-pay | Admitting: Internal Medicine

## 2014-11-20 ENCOUNTER — Encounter: Payer: Self-pay | Admitting: Hematology

## 2014-11-20 ENCOUNTER — Non-Acute Institutional Stay (SKILLED_NURSING_FACILITY): Payer: Medicare Other | Admitting: Internal Medicine

## 2014-11-20 ENCOUNTER — Ambulatory Visit (HOSPITAL_COMMUNITY)
Admission: RE | Admit: 2014-11-20 | Discharge: 2014-11-20 | Disposition: A | Discharge status: Home / Self Care | Payer: Medicare Other | Source: Ambulatory Visit | Attending: Hematology | Admitting: Hematology

## 2014-11-20 VITALS — BP 115/31 | HR 91 | Temp 98.6°F | Resp 20

## 2014-11-20 DIAGNOSIS — D721 Eosinophilia: Secondary | ICD-10-CM | POA: Diagnosis not present

## 2014-11-20 DIAGNOSIS — M898X9 Other specified disorders of bone, unspecified site: Secondary | ICD-10-CM

## 2014-11-20 DIAGNOSIS — G893 Neoplasm related pain (acute) (chronic): Secondary | ICD-10-CM | POA: Diagnosis not present

## 2014-11-20 DIAGNOSIS — D72119 Hypereosinophilic syndrome (hes), unspecified: Secondary | ICD-10-CM

## 2014-11-20 DIAGNOSIS — R627 Adult failure to thrive: Secondary | ICD-10-CM | POA: Diagnosis not present

## 2014-11-20 DIAGNOSIS — D471 Chronic myeloproliferative disease: Secondary | ICD-10-CM

## 2014-11-20 DIAGNOSIS — J90 Pleural effusion, not elsewhere classified: Secondary | ICD-10-CM

## 2014-11-20 DIAGNOSIS — M899 Disorder of bone, unspecified: Secondary | ICD-10-CM

## 2014-11-20 LAB — PREPARE RBC (CROSSMATCH)

## 2014-11-20 MED ORDER — SODIUM CHLORIDE 0.9 % IV SOLN
250.0000 mL | Freq: Once | INTRAVENOUS | Status: AC
  Administered 2014-11-20: 250 mL via INTRAVENOUS

## 2014-11-20 MED ORDER — ACETAMINOPHEN 325 MG PO TABS
650.0000 mg | ORAL_TABLET | Freq: Once | ORAL | Status: AC
  Administered 2014-11-20: 650 mg via ORAL
  Filled 2014-11-20: qty 2

## 2014-11-20 MED ORDER — SODIUM CHLORIDE 0.9 % IJ SOLN: 10.0000 mL | INTRAMUSCULAR | Status: DC | PRN

## 2014-11-20 MED ORDER — HEPARIN SOD (PORK) LOCK FLUSH 100 UNIT/ML IV SOLN: 500.0000 [IU] | Freq: Every day | INTRAVENOUS | Status: DC | PRN

## 2014-11-20 MED ORDER — SODIUM CHLORIDE 0.9 % IJ SOLN: 3.0000 mL | INTRAMUSCULAR | Status: DC | PRN

## 2014-11-20 MED ORDER — HEPARIN SOD (PORK) LOCK FLUSH 100 UNIT/ML IV SOLN: 250.0000 [IU] | INTRAVENOUS | Status: DC | PRN

## 2014-11-20 NOTE — Telephone Encounter (Signed)
Dr. Burr Medico with [facility] called and stated that she would like to speak with you regarding patient before tomorrow. She saw her yesterday. Please Call Dr. Burr Medico [phone]

## 2014-11-20 NOTE — Progress Notes (Signed)
Patient ID: April [name], female   DOB: 1925/11/20, 81 y.o.   MRN: [id number] Please note that 35 minutes were spent discussing goals of care and completing a MOST form with Mrs. Hipple and her family.  Another 45 mins were spent seeing her, examining her and coordinating care with nursing staff, her family and oncology.

## 2014-11-20 NOTE — Progress Notes (Signed)
Hypereosinophilic syndrome - Primary - ICD-10-CM: D72.1    MD: Ky Barban  Procedure: Pt received 2 units of RBC's  Condition during procedure: Pt tolerated well.  Condition post procedure: Pt alert, oriented and ambulatory.

## 2014-11-20 NOTE — Progress Notes (Signed)
Patient ID: April [name], female   DOB: 1925-08-19, 81 y.o.   MRN: [id number]  Location:  [facility] Provider:  Rexene Edison. Shakemia Madera, D.O., C.M.D. Hollace Kinnier, DO  Code Status:  DNR Goals of care: Advanced Directive information Does patient have an advance directive?: Yes, Type of Advance Directive: Kimberly;Living will;Out of facility DNR (pink MOST or yellow form), Pre-existing out of facility DNR order (yellow form or pink MOST form): Pink MOST form placed in chart (order not valid for inpatient use), Does patient want to make changes to advanced directive?: No - Patient declined  Chief Complaint  Patient presents with  . Medical Management of Chronic Issues    f/u goals of care and next steps to diagnosis and treatment    HPI:  Pt is a 81 y.o. white female seen today for medical management of chronic diseases--primarily to continue goals of care discussion and determine next steps to diagnosis and treatment for her current bone marrow disorder.  She went for a transfusion this am, but does not yet feel a whole lot better (maybe too early).  She is having more pain in her thighs.  She tells me she'd rather have one good month here at [facility] than 6 bad months of testing and treatments.  She, April [name] and April [name] wanted to know if there are labs that could be done instead of going to [facility] and why she would have to travel there.  I spoke with Dr. Burr Medico and got clarification on all of this and it is felt that there is a specialist there in [place] that may be able to offer her a new treatment that might improve her quality of life.  Discussed this with April [name] and she said she thinks [place] if leaning away from going--does not want to travel there if she does not feel well.    Review of Systems  Constitutional: Positive for malaise/fatigue. Negative for fever and chills.  Eyes: Negative for blurred vision.  Respiratory: Negative for shortness of breath.   Cardiovascular: Negative for  chest pain.  Gastrointestinal: Negative for abdominal pain, constipation, blood in stool and melena.  Genitourinary: Negative for dysuria.  Musculoskeletal: Positive for myalgias.       Bone pain in thighs  Neurological: Positive for weakness and headaches. Negative for dizziness and loss of consciousness.  Psychiatric/Behavioral: Positive for depression. Negative for memory loss.    Past Medical History  Diagnosis Date  . History of breast cancer onocologist--  dr Jana Hakim--  no recurrence    dx 2011-- DCIS, Right breast,  ER/PR positive --- s/p lumpectomy  No radiation, took Aromasen 2012  . Senile osteoporosis     Took Fosamax x15years  . GERD (gastroesophageal reflux disease)   . Sigmoid diverticulosis   . Wears hearing aid     bilateral  . PMB (postmenopausal bleeding)   . Colon cancer [facility]) oncologist--  dr Truitt Merle    dx jan 2016  --  Ascending colon carcinoma,  Stage IIIB,  pT3 N1a M0,  Grade 2,  MSI,  1 of 15 nodes +//   s/p  right colectomy  . Hypereosinophilic syndrome   . Chronic diarrhea    Past Surgical History  Procedure Laterality Date  . Cataract extraction w/ intraocular lens  implant, bilateral Bilateral [id number]  . Tonsillectomy and adenoidectomy  1930  . Colonoscopy N/A 01/28/2014    Procedure: COLONOSCOPY;  Surgeon: Missy Sabins, MD;  Location: [facility];  Service: Endoscopy;  Laterality: N/A;  . Esophagogastroduodenoscopy N/A 01/28/2014    Procedure: ESOPHAGOGASTRODUODENOSCOPY (EGD);  Surgeon: Missy Sabins, MD;  Location: [facility];  Service: Endoscopy;  Laterality: N/A;  . Laparoscopic partial right colectomy Right 01/30/2014    Procedure: LAPAROSCOPIC PARTIAL RIGHT COLECTOMY;  Surgeon: Pedro Earls, MD;  Location: [facility];  Service: General;  Laterality: Right;  . Laparotomy N/A 01/31/2014    Procedure: EXPLORATORY LAPAROTOMY, RESECTION OF PRIOR SMALL BOWEL ANASTOMOSIS, UPPER ENDOSCOPY, CREATED A  NEW ILEO COLOSTOMY;  Surgeon: Pedro Earls, MD;   Location: [facility];  Service: General;  Laterality: N/A;  . Laparoscopic cholecystectomy  05-26-2009  . Breast lumpectomy Right 12-24-2009  . Transthoracic echocardiogram  04-18-2014    mild LVH/  ef 21-30%/  grade I diastolic dysfunction/  mild LAE/  trivial MR and TR  . Dilatation & currettage/hysteroscopy with resectocope N/A 05/16/2014    Procedure: DILATATION & CURETTAGE/HYSTEROSCOPY WITH RESECTOCOPE;  Surgeon: Arvella Nigh, MD;  Location: [facility];  Service: Gynecology;  Laterality: N/A;  . Laparoscopy N/A 05/16/2014    Procedure: LAPAROSCOPY DIAGNOSTIC;  Surgeon: Arvella Nigh, MD;  Location: [facility];  Service: Gynecology;  Laterality: N/A;    Allergies  Allergen Reactions  . Ativan [Lorazepam] Other (See Comments)    confusion  . Codeine Nausea And Vomiting  . Sulfa Antibiotics Other (See Comments)    Unknown childhood reaction      Medication List       This list is accurate as of: 11/20/14 11:59 PM.  Always use your most recent med list.               ALPRAZolam 0.25 MG tablet  Commonly known as:  XANAX  Take 0.25 mg by mouth 2 (two) times daily as needed for anxiety.     Calcium Carbonate-Vitamin D 600-400 MG-UNIT tablet  Take 1 tablet by mouth daily.     CENTRUM SILVER ADULT 50+ PO  Take 1 tablet by mouth daily.     cholecalciferol 1000 UNITS tablet  Commonly known as:  VITAMIN D  Take 4,000 Units by mouth at bedtime.     cyclobenzaprine 5 MG tablet  Commonly known as:  FLEXERIL  Take 1 tablet (5 mg total) by mouth 3 (three) times daily as needed (tension headache).     FUSION PO  Take 1 tablet by mouth daily.     ipratropium-albuterol 0.5-2.5 (3) MG/3ML Soln  Commonly known as:  DUONEB  Take 3 mLs by nebulization every 6 (six) hours as needed. And q 6 prn sob or cough     mirtazapine 15 MG tablet  Commonly known as:  REMERON  Take 1 tablet (15 mg total) by mouth at bedtime.     OVER THE COUNTER MEDICATION  Take 1  tablet by mouth at bedtime. Macular supplement     potassium chloride SA 20 MEQ tablet  Commonly known as:  K-DUR,KLOR-CON     saccharomyces boulardii 250 MG capsule  Commonly known as:  FLORASTOR  Take 250 mg by mouth 2 (two) times daily.     sertraline 50 MG tablet  Commonly known as:  ZOLOFT  Take 1 tablet (50 mg total) by mouth daily.        Immunization History  Administered Date(s) Administered  . Influenza,inj,Quad PF,36+ Mos 10/10/2014  . Influenza-Unspecified 10/26/2013  . Pneumococcal-Unspecified 01/27/2003  . Td 07/26/2002, 01/26/2014  . Zoster 09/26/2005   Pertinent  Health Maintenance Due  Topic Date Due  .  PNA vac Low Risk Adult (2 of 2 - PCV13) 01/27/2004  . MAMMOGRAM  12/09/2014  . INFLUENZA VACCINE  08/27/2015  . DEXA SCAN  Completed   Fall Risk  05/23/2014 04/25/2014  Falls in the past year? No No    Filed Vitals:   11/20/14 1223  BP: 110/56  Pulse: 97  Temp: 97.5 F (36.4 C)  Resp: 23  SpO2: 97%   There is no weight on file to calculate BMI. Physical Exam  Constitutional: She is oriented to person, place, and time. No distress.  Cardiovascular: Normal rate, regular rhythm, normal heart sounds and intact distal pulses.   Pulmonary/Chest: Effort normal. She has rales.  bases  Abdominal: Soft. Bowel sounds are normal.  Musculoskeletal: Normal range of motion.  Walking with her walker with therapy  Neurological: She is alert and oriented to person, place, and time.  Skin: Skin is warm and dry.  Psychiatric: She has a normal mood and affect.    Labs reviewed:  Recent Labs  01/25/14 0643  02/07/14 0900 02/08/14 0515 02/09/14 0526  10/22/14 1424 10/26/14 0825 10/31/14 1309 11/15/14  NA 140  < > 136 140 137  < > 139 141 140 138  K 4.1  < > 3.3* 3.8 3.6  < > 3.3* 4.1 4.0 3.2*  CL 112  < > 105 105 105  --   --   --   --   --   CO2 23  < > 25 26 26   < > 25 21* 25  --   GLUCOSE 117*  < > 104* 88 91  < > 142* 127 132  --   BUN 13  < > 10 9  9   < > 12.1 13.2 20.1 10  CREATININE 0.63  < > 0.60 0.68 0.66  < > 0.8 0.8 0.8 0.6  CALCIUM 7.8*  < > 8.1* 8.5 8.3*  < > 9.1 9.5 9.4  --   MG 1.8  --  1.8 2.2  --   --   --   --   --   --   PHOS 3.2  --   --   --   --   --   --   --   --   --   < > = values in this interval not displayed.  Recent Labs  10/22/14 1424 10/26/14 0825 10/31/14 1309  AST 19 20 14   ALT 22 26 17   ALKPHOS 88 79 64  BILITOT 0.68 0.65 0.68  PROT 6.7 6.4 6.4  ALBUMIN 3.9 3.6 3.8    Recent Labs  10/31/14 1308 11/07/14 0937 11/12/14 1413 11/15/14 11/19/14 1320  WBC 44.8* 14.0* 48.6* 68.7 151.7*  NEUTROABS 40.1* 11.3* 34.8*  --   --   HGB 10.8* 8.8* 8.9* 7.4* 7.1*  HCT 32.6* 27.2* 27.8* 21* 22.7*  MCV 82.3 84.0 83.9  --  85.6  PLT 76 Large & giant platelets* 37* 53* 35* 33*   Lab Results  Component Value Date   TSH 4.055 01/25/2014   No results found for: HGBA1C Lab Results  Component Value Date   CHOL 58 11/15/2014   HDL 5* 11/15/2014   LDLCALC 27 11/15/2014   TRIG 128 11/15/2014    Significant Diagnostic Results in last 30 days:  Ct Chest W Contrast  11/12/2014  CLINICAL DATA:  Colon cancer diagnosed January 2016, status post resection, for restaging. History of breast cancer diagnosed 2011 status post right lumpectomy. Prior cholecystectomy. Persistent dry cough  x1 week. EXAM: CT CHEST, ABDOMEN, AND PELVIS WITH CONTRAST TECHNIQUE: Multidetector CT imaging of the chest, abdomen and pelvis was performed following the standard protocol during bolus administration of intravenous contrast. CONTRAST:  85m OMNIPAQUE IOHEXOL 300 MG/ML  SOLN COMPARISON:  CT chest dated 03/16/2014. CT abdomen pelvis dated 01/25/2015. FINDINGS: CT CHEST FINDINGS Mediastinum/Nodes: The heart is normal in size. No pericardial effusion. Atherosclerotic calcifications of the aortic arch. Ectasia of the ascending thoracic aorta, measuring 3.4 cm. 8 mm short axis high right paratracheal node (series 2/ image 15), within normal  limits. No suspicious mediastinal, hilar, or axillary lymphadenopathy. Visualized thyroid is unremarkable. Lungs/Pleura: Evaluation of lung parenchyma (particularly the upper lobes) is constrained by respiratory motion. No suspicious pulmonary nodules. Mild lingular opacity, likely atelectasis. Mild dependent/compressive atelectasis in the bilateral lower lobes. Small bilateral pleural effusions. No pneumothorax. Musculoskeletal: Status post lumpectomy with postsurgical changes in the lower outer right breast (series 2/images 32-33). Degenerative changes of the thoracic spine. CT ABDOMEN PELVIS FINDINGS Motion degraded images. Hepatobiliary: Liver is within normal limits. No suspicious/enhancing hepatic lesions. Status post cholecystectomy. No intrahepatic or extrahepatic ductal dilatation. Pancreas: Within normal limits. Spleen: Splenomegaly, measuring 17.4 cm in maximal craniocaudal dimension, new. Adrenals/Urinary Tract: Adrenal glands are within normal limits. Kidneys are within normal limits.  No hydronephrosis. Bladder is unremarkable. Stomach/Bowel: Stomach is within normal limits. Status post ileocecal resection with anastomosis in the right mid abdomen (series 2/image 86). Appendix is surgically absent. Sigmoid diverticulosis, without evidence of diverticulitis. Vascular/Lymphatic: No evidence of abdominal aortic aneurysm. No suspicious abdominopelvic lymphadenopathy. Reproductive: Uterus is within normal limits. Bilateral ovaries are within normal limits. Other: No abdominopelvic ascites. Musculoskeletal: Mild inferior endplate changes at L1, chronic. Moderate superior endplate compression fracture deformity at L4 (sagittal image 58), with approximately 40% loss of height, new. IMPRESSION: Motion degraded images. Status post ileocecal resection. No evidence of recurrent or metastatic disease. Splenomegaly, new. Small bilateral pleural effusions, new. Moderate compression fracture deformity at L4, new.  Additional stable ancillary findings as above. Electronically Signed   By: SJulian HyM.D.   On: 11/12/2014 15:20   Ct Abdomen Pelvis W Contrast  11/12/2014  CLINICAL DATA:  Colon cancer diagnosed January 2016, status post resection, for restaging. History of breast cancer diagnosed 2011 status post right lumpectomy. Prior cholecystectomy. Persistent dry cough x1 week. EXAM: CT CHEST, ABDOMEN, AND PELVIS WITH CONTRAST TECHNIQUE: Multidetector CT imaging of the chest, abdomen and pelvis was performed following the standard protocol during bolus administration of intravenous contrast. CONTRAST:  773mOMNIPAQUE IOHEXOL 300 MG/ML  SOLN COMPARISON:  CT chest dated 03/16/2014. CT abdomen pelvis dated 01/25/2015. FINDINGS: CT CHEST FINDINGS Mediastinum/Nodes: The heart is normal in size. No pericardial effusion. Atherosclerotic calcifications of the aortic arch. Ectasia of the ascending thoracic aorta, measuring 3.4 cm. 8 mm short axis high right paratracheal node (series 2/ image 15), within normal limits. No suspicious mediastinal, hilar, or axillary lymphadenopathy. Visualized thyroid is unremarkable. Lungs/Pleura: Evaluation of lung parenchyma (particularly the upper lobes) is constrained by respiratory motion. No suspicious pulmonary nodules. Mild lingular opacity, likely atelectasis. Mild dependent/compressive atelectasis in the bilateral lower lobes. Small bilateral pleural effusions. No pneumothorax. Musculoskeletal: Status post lumpectomy with postsurgical changes in the lower outer right breast (series 2/images 32-33). Degenerative changes of the thoracic spine. CT ABDOMEN PELVIS FINDINGS Motion degraded images. Hepatobiliary: Liver is within normal limits. No suspicious/enhancing hepatic lesions. Status post cholecystectomy. No intrahepatic or extrahepatic ductal dilatation. Pancreas: Within normal limits. Spleen: Splenomegaly, measuring 17.4 cm in maximal  craniocaudal dimension, new. Adrenals/Urinary  Tract: Adrenal glands are within normal limits. Kidneys are within normal limits.  No hydronephrosis. Bladder is unremarkable. Stomach/Bowel: Stomach is within normal limits. Status post ileocecal resection with anastomosis in the right mid abdomen (series 2/image 86). Appendix is surgically absent. Sigmoid diverticulosis, without evidence of diverticulitis. Vascular/Lymphatic: No evidence of abdominal aortic aneurysm. No suspicious abdominopelvic lymphadenopathy. Reproductive: Uterus is within normal limits. Bilateral ovaries are within normal limits. Other: No abdominopelvic ascites. Musculoskeletal: Mild inferior endplate changes at L1, chronic. Moderate superior endplate compression fracture deformity at L4 (sagittal image 58), with approximately 40% loss of height, new. IMPRESSION: Motion degraded images. Status post ileocecal resection. No evidence of recurrent or metastatic disease. Splenomegaly, new. Small bilateral pleural effusions, new. Moderate compression fracture deformity at L4, new. Additional stable ancillary findings as above. Electronically Signed   By: Julian Hy M.D.   On: 11/12/2014 15:20    Assessment/Plan 1. Hypereosinophilic syndrome -seems this has progressed into a more aggressive malignancy now  2. Myeloproliferative neoplasm ([place]) -pt is not interested in more testing and evaluation, wants to be kept comfortable here at [place] with her friends and family -they have not yet decided on hospice care  3. Malignant bone pain -suspected -she does not want strong medicine if we can avoid it -muscle spasm treatment was ineffective and now both legs are involved -schedule tylenol ES 2 po tid   4. FTT (failure to thrive) in adult -due to #2, is losing weight, intake remains poor, spirits better with remeron and zoloft  5. Pleural effusion, bilateral -noted, cont IS as she is able and ambulation when she feels up to it  Family/ staff Communication: 12 mins spent on  phone coordinating care with Dr. Burr Medico and 45 mins spent with pt, her husband and her daughter discussing goals of care  Labs/tests ordered:  none Denette Hass L. Mayela Bullard, D.O. [facility] [address], [place] [address] Cell Phone (Mon-Fri 8am-5pm):  [phone] On Call:  [phone] & follow prompts after 5pm & weekends Office Phone:  [phone] Office Fax:  [phone]

## 2014-11-20 NOTE — Telephone Encounter (Signed)
Spoke with Dr. Feng.  I saw April [name] earlier and we had a lengthy discussion once again about her goals of care.  She is most concerned about quality of life, does not want to continue on in her weak/frail state at this point.  She did not want another bone marrow biopsy.  Apparently, review of previous bone marrow and  Negative BCR-ABL testing suggested she did not have CML in April.  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  There are no plans for further chemotherapy or bone marrow biopsies.  Plan is for a consult with a specialist on [place] to see if there might be options for treatment that will improve her quality of life from where it is now.  She does not want anything that would make her feel worse.  She'd like to be able to enjoy a meal in the dining room and socialize with friends and family.  The specialist would review her slides, labs and discuss any additional options.  They would assess for other possible gene mutations.  Treatments would likely be palliative based on her frailty at this time and her personal goals.  After speaking with Dr. Feng, I called April [name]' daughter April [name] ([phone]) back and reviewed the above.  She will discuss this with her parents tomorrow when she comes back by [facility]. I will check back with them at the end of clinic tomorrow.  

## 2014-11-20 NOTE — Progress Notes (Signed)
[place] OFFICE PROGRESS NOTE  CC: Dr. Neldon Mc Dr. Carmela Rima, [facility], DO [address] [place] [address] OTHER MDS: Clayton Lefort  DIAGNOSIS: stage III colon cancer, hypereosinophilic syndrome/MPN  Colon cancer   Staging form: Colon and Rectum, AJCC 7th Edition     Clinical stage from 01/30/2014: Stage IIIB (T3, N1a, M0) - Unsigned     Colon cancer ([place])   01/28/2014 Tumor Marker CEA  14.6. Tumor MMR deficient, MSI high   01/30/2014 Initial Diagnosis Colon cancer   01/30/2014 Surgery right colon segmental resection, with clear margins.   01/30/2014 Pathologic Stage PT3N1aM0, stage IIIB, 1 out of 15 lymph nodes was positive.    Hypereosinophilic syndrome   6/38/7564 Bone Marrow Biopsy her bone marrow showed hypercellular with  granulocytic poor proliferation including neutrophilia and eosinophil, megakaryocyte proliferation. Fish was negative for PDGFRA, PDGFRB, FGFR1, BCR-ABLand CBFB, unclear if primary or secondary MPN/MDS process.    04/16/2014 Initial Diagnosis Hypereosinophilic syndrome   3/32/9518 -  Chemotherapy his white count went up to 100,000, I started her on Hydrea, dose titrated to 1 g twice daily   OTHER ISSUES: 1. 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 patient was then begun on Aromasin 25 mg daily however she was discontinued 9 days later due to the fact that she developed  grade 3 arthralgias and myalgias. 2. Leukocytosis since July 2015, with significant neutrophilia and eosinophilia,likely hypereosinophilic syndrome  3. History of thrombocytopenia when she was young  HISTORY OF INITIAL PRESENTATION (03/02/2014): April [name] is a 81 year old Caucasian female, with past medical history of breast DCIS, previously under my colleague Dr. Virgie Dad care, and was transferred to me due to her recent diagnosed colon cancer.  She presented with diarrhea for 6 month and acute rectal  bleeding and was admitted to hospital on 01/27/2014. She was transfused PRBCs. CT scan suggested bleeding from angiodysplasia. She was admitted for further management. She she underwent colonoscopy and EGD. Colonic mass was noted along with some abnormal duodenal findings. Biopsy of the colon mass showed adenocarcinoma. The patient underwent laproscopic R hemicolectomy on 1/5. A large bleed developed post-operatively and the patient was first transferred to the ICU and later underwent ex-lap with resection of small bowel anastamosis and new ileocolostomy on 1/6. She was discharged to a rehab for a week and got home about one weeks ago.   She has home PT also now. She is able to walk one mile daily and all does all her ADLs. She has not gone outside yet.  She denies any pain, no nausea, her appetite has been low since surgery, she eats cheese, soup, and small regular meals, she lost 15lbs since the surgery. She has headaches for the past few month ago, and had brain MRI in the hospital which was negative.  She still has intermittent loose BM, 1-2 daily.   She was also noticed to have significant leukocytosis since July 2015, with dominantly elevated neutrophil and eosinophil. She had some cytopenia when she was young, and developed some cyst pia around her colon surgery, now resolved.  CURRENT THERAPY: hydrea 510m daily started on 10/11/14, changed to 5030min am, and 100061mm on 10/23/14, increased to 1000m35m AM, 1500mg63mpm on 10/26/14, then decreased to 1000mg 53m on 10/5, held on 10/12, restart at 500mg d43m from 11/13/14 and stopped on 11/15/2014 due to cytopenia   INTERIM HISTORY: FrancesArihanas for follow-up.  She was [facility] to [facility] due to fever and worsening cough. She was treated with antibiotics Levaquin, and her cough has improved since then. She still has intermittent fever at night, with night sweats. She feels extremely  fatigued, not able to ambulate by herself, needs assistance to use the bathroom. She also complains worsening low back pain, which she had before, and she feels it may be related to her position and chair in the rehabilitation. Her headache is slightly better. She denies any bleeding signs. I discussed with her primary care physician Dr. Mariea Clonts last week and reviewed her CBC, I stopped her diet Hydrea last Thursday due to worsening thrombus that opinion. Her Remeron was switched back to Zoloft since she was admitted to rehabilitation, and her appetite declined again.  MEDICAL HISTORY: Past Medical History  Diagnosis Date  . History of breast cancer onocologist--  dr Jana Hakim--  no recurrence    dx 2011-- DCIS, Right breast,  ER/PR positive --- s/p lumpectomy  No radiation, took Aromasen 2012  . Senile osteoporosis     Took Fosamax x15years  . GERD (gastroesophageal reflux disease)   . Sigmoid diverticulosis   . Wears hearing aid     bilateral  . PMB (postmenopausal bleeding)   . Colon cancer [facility]) oncologist--  dr Truitt Merle    dx jan 2016  --  Ascending colon carcinoma,  Stage IIIB,  pT3 N1a M0,  Grade 2,  MSI,  1 of 15 nodes +//   s/p  right colectomy  . Hypereosinophilic syndrome   . Chronic diarrhea     ALLERGIES:  is allergic to ativan; codeine; and sulfa antibiotics.  MEDICATIONS:  Current Outpatient Prescriptions  Medication Sig Dispense Refill  . ALPRAZolam (XANAX) 0.25 MG tablet Take 0.25 mg by mouth 2 (two) times daily as needed for anxiety.    . cyclobenzaprine (FLEXERIL) 5 MG tablet Take 1 tablet (5 mg total) by mouth 3 (three) times daily as needed (tension headache). 90 tablet 0  . Fe Fum-Fe Poly-Vit C-Lactobac (FUSION PO) Take 1 tablet by mouth daily.    [name] ipratropium-albuterol (DUONEB) 0.5-2.5 (3) MG/3ML SOLN Take 3 mLs by nebulization 2 (two) times daily. And q 6 prn sob or cough    . Multiple Vitamins-Minerals (CENTRUM SILVER ADULT 50+ PO) Take 1 tablet by mouth daily.      [name] OVER THE COUNTER MEDICATION Take 1 tablet by mouth at bedtime. Macular supplement    . saccharomyces boulardii (FLORASTOR) 250 MG capsule Take 250 mg by mouth 2 (two) times daily.    . sertraline (ZOLOFT) 50 MG tablet Take 1 tablet (50 mg total) by mouth daily. 30 tablet 3  . Calcium Carbonate-Vitamin D 600-400 MG-UNIT per tablet Take 1 tablet by mouth daily.    . cholecalciferol (VITAMIN D) 1000 UNITS tablet Take 4,000 Units by mouth at bedtime.     . mirtazapine (REMERON) 15 MG tablet Take 1 tablet (15 mg total) by mouth at bedtime. (Patient not taking: Reported on 11/19/2014) 30 tablet 2  . potassium chloride SA (K-DUR,KLOR-CON) 20 MEQ tablet   0   No current facility-administered medications for this visit.    SURGICAL HISTORY:  Past Surgical History  Procedure Laterality Date  . Cataract extraction w/ intraocular lens  implant, bilateral Bilateral 9518,8416  . Tonsillectomy and adenoidectomy  1930  . Colonoscopy N/A 01/28/2014    Procedure: COLONOSCOPY;  Surgeon: Missy Sabins, MD;  Location: [facility];  Service: Endoscopy;  Laterality: N/A;  . Esophagogastroduodenoscopy N/A 01/28/2014    Procedure: ESOPHAGOGASTRODUODENOSCOPY (EGD);  Surgeon: Missy Sabins, MD;  Location: [facility];  Service: Endoscopy;  Laterality: N/A;  . Laparoscopic partial right colectomy Right 01/30/2014    Procedure: LAPAROSCOPIC PARTIAL RIGHT COLECTOMY;  Surgeon: Pedro Earls, MD;  Location: [facility];  Service: General;  Laterality: Right;  . Laparotomy N/A 01/31/2014    Procedure: EXPLORATORY LAPAROTOMY, RESECTION OF PRIOR SMALL BOWEL ANASTOMOSIS, UPPER ENDOSCOPY, CREATED A  NEW ILEO COLOSTOMY;  Surgeon: Pedro Earls, MD;  Location: [facility];  Service: General;  Laterality: N/A;  . Laparoscopic cholecystectomy  05-26-2009  . Breast lumpectomy Right 12-24-2009  . Transthoracic echocardiogram  04-18-2014    mild LVH/  ef 81-01%/  grade I diastolic dysfunction/  mild LAE/  trivial MR and TR  . Dilatation &  currettage/hysteroscopy with resectocope N/A 05/16/2014    Procedure: DILATATION & CURETTAGE/HYSTEROSCOPY WITH RESECTOCOPE;  Surgeon: Arvella Nigh, MD;  Location: [facility];  Service: Gynecology;  Laterality: N/A;  . Laparoscopy N/A 05/16/2014    Procedure: LAPAROSCOPY DIAGNOSTIC;  Surgeon: Arvella Nigh, MD;  Location: [facility];  Service: Gynecology;  Laterality: N/A;   ROS  Constitutional: Denies fevers, chills or abnormal night sweats, (+) weight loss and fatigue  Eyes: Denies blurriness of vision, double vision or watery eyes Ears, nose, mouth, throat, and face: Denies mucositis or sore throat Respiratory: Denies cough, dyspnea or wheezes Cardiovascular: Denies palpitation, chest discomfort or lower extremity swelling Gastrointestinal: Denies nausea, heartburn, (+) diarrhea Skin: No skin rash  Lymphatics: Denies new lymphadenopathy or easy bruising Neurological:Denies numbness, tingling or new weaknesses, (+) headaches Behavioral/Psych: (+) Depression and anxiety, slightly worse lately   All other systems were reviewed with the patient and are negative.  PHYSICAL EXAMINATION: Filed Vitals:   11/19/14 1406  BP: 112/43  Pulse: 106  Temp: 98.3 F (36.8 C)  Resp: 18   Body mass index is 21.05 kg/(m^2).  ECOG PERFORMANCE STATUS: 3-4 General appearance: Very fatigued, chronically ill appearance, came in a wheelchair Sclerae unicteric, pupils equal and reactive Oropharynx clear and dry-- no thrush or other lesions No cervical or supraclavicular adenopathy Lungs no rales or rhonchi Heart regular rate and rhythm Abd soft, surgical scars well healed, nontender, positive bowel sounds, no masses palpated MSK no focal spinal tenderness, no upper extremity lymphedema Neuro: nonfocal, well oriented, appropriate affect Breasts: The right breast is status post lumpectomy. There is no evidence of local recurrence. The right axilla is benign. The left breast is  unremarkable. Skin: no new skin rashes, previous skin rash has resolved, a few small ecchymosis on her arms.   LABORATORY DATA: Outside labs reviewed CBC Latest Ref Rng 11/19/2014 11/15/2014 11/12/2014  WBC 3.9 - 10.3 10e3/uL 151.7(HH) 68.7 48.6(H)  Hemoglobin 11.6 - 15.9 g/dL 7.1(L) 7.4(A) 8.9(L)  Hematocrit 34.8 - 46.6 % 22.7(L) 21(A) 27.8(L)  Platelets 145 - 400 10e3/uL 33(L) 35(A) 53(L)    CMP Latest Ref Rng 11/15/2014 10/31/2014 10/26/2014  Glucose 70 - 140 mg/dl - 132 127  BUN 4 - 21 mg/dL 10 20.1 13.2  Creatinine 0.5 - 1.1 mg/dL 0.6 0.8 0.8  Sodium 137 - 147 mmol/L 138 140 141  Potassium 3.4 - 5.3 mmol/L 3.2(A) 4.0 4.1  Chloride 96 - 112 mEq/L - - -  CO2 22 - 29 mEq/L - 25 21(L)  Calcium 8.4 - 10.4 mg/dL - 9.4 9.5  Total Protein 6.4 - 8.3 g/dL - 6.4 6.4  Total Bilirubin 0.20 - 1.20 mg/dL - 0.68 0.65  Alkaline Phos 40 - 150 U/L - 64 79  AST 5 - 34 U/L - 14 20  ALT 0 - 55 U/L - 17 26   CEA  Status: Finalresult Visible to patient:  MyChart Nextappt: 11/12/2014 at 02:00 PM in Oncology [facility] 4) Dx:  Colon cancer ([place])           Ref Range 7d ago  4wk ago  48moago  826mogo     CEA 0.0 - 5.0 ng/mL 0.8 2.0 2.6 2.2         Pathology reports: Colon, segmental resection for tumor, partial right 01/30/2014  1 of 3 FINAL for Lombardo, [place] A ([id number](continued) INVASIVE ADENOCARCINOMA, INVADING THROUGH THE MUSCULARIS PROPRIA INTO PERICOLONIC FATTY TISSUE. ONE OF FIFTEEN LYMPH NODES, POSITIVE FOR METASTATIC ADENOCARCINOMA (1/15). RESECTION MARGINS, NEGATIVE FOR TUMOR. APPENDIX: BENIGN APPENDICEAL TISSUE, NO EVIDENCE OF ACTIVE INFLAMMATION OR NEOPLASM. Specimen: Right colon. Procedure: Segmental resection Tumor site: Ascending colon Specimen integrity: Intact Macroscopic intactness of mesorectum: Not applicable. Macroscopic tumor perforation: N/A Invasive tumor: Maximum size: 5.0 cm Histologic type(s): Invasive adenocarcinoma  with extracellular mucin Histologic grade and differentiation: G2: moderately differentiated/low grade Type of polyp in which invasive carcinoma arose: N/A Microscopic extension of invasive tumor: invading through muscularis propria into pericolonic fatty tissue. Lymph-Vascular invasion: Not identified Peri-neural invasion: No Tumor deposit(s) (discontinuous extramural extension): No Resection margins: Negative Proximal margin: 10 cm Distal margin: 9 cm Circumferential (radial) (posterior ascending, posterior descending; lateral and posterior mid-rectum; and entire lower 1/3 rectum): 3 cm Treatment effect (neo-adjuvant therapy): No Additional polyp(s): N/A Non-neoplastic findings: N/A Lymph nodes: number examined 1; number positive: 15 Pathologic Staging: p T3, N1a, Mx Ancillary studies: MMR stains will be RADIOGRAPHIC STUDIES:  COMPARISON: Multiple priors  ACR Breast Density Category b: There are scattered areas of  fibroglandular density.  FINDINGS:  The patient has had a right lumpectomy. No new mass or suspicious  calcifications are seen in either breast. Compared to priors.  Mammographic images were processed with CAD.  IMPRESSION:  No mammographic evidence of local recurrence, right breast. No  mammographic evidence of malignancy, left breast.  RECOMMENDATION:  Diagnostic mammography in 1 year.  I have discussed the findings and recommendations with the patient.  Results were also provided in writing at the conclusion of the  visit. If applicable, a reminder letter will be sent to the patient  regarding the next appointment.  BI-RADS CATEGORY 1: Negative  Electronically Signed  By: KeDuke Salvia.D.  On: 12/02/2012 11:47   ADDITIONAL INFORMATION: Mismatch Repair (MMR) Protein Immunohistochemistry (IHC) IHC Expression Result: MLH1: LOSS OF NUCLEAR EXPRESSION (LESS THAN 5% TUMOR EXPRESSION) MSH2: Preserved nuclear expression (greater 50% tumor expression) MSH6:  Preserved nuclear expression (greater 50% tumor expression) PMS2: LOSS OF NUCLEAR EXPRESSION (LESS THAN 5% TUMOR EXPRESSION) * Internal control demonstrates intact nuclear expression Interpretation: ABNORMAL There is loss of the major and minor MMR proteins MLH1 and PMS2. The loss of expression may be secondary to promoter hyper-methylation, gene mutation or other genetic event. BRAF mutation testing and/or MLH1 methylation testing is indicated. The presence of a BRAF mutation and/or MLH1 hyper-methylation is indicative of a sporadic-type tumor. The absence of either BRAF mutation and/or presence of normal-methylation indicate the possible presence of a hereditary germline mutation (e.g. Lynch syndrome) and referral to genetic counseling is warranted. It is recommended that the loss of protein expression be correlated with molecular based MSI testing.  wil  Bone Marrow, Aspirate,Biopsy, and Clot, right iliac 04/16/2014 - HYPERCELLULAR BONE MARROW FOR AGE WITH GRANULOCYTIC PROLIFERATION INCLUDING NEUTROPHILIA AND EOSINOPHILIA. - MEGAKARYOCYTIC PROLIFERATION. - SEE COMMENT. PERIPHERAL BLOOD: - LEUKOCYTOSIS WITH NEUTROPHILIA, EOSINOPHILIA AND BASOPHILIA. Diagnosis Note It is unclear whether the morphologic features represent a secondary or a primary clonal process particularly a myeloproliferative myeloproliferative/myelodysplastic process with eosinophilia. Previous FISH studies performed on peripheral blood including PDGFRA, PGDFRB, FGFR1, BCR-ABL, and CBFB are all negative. Selected FISH studies will be repeated on bone marrow material and the results reported under separate cover. There is no increase in blastic cells or evidence of metastatic carcinoma in this material.    ASSESSMENT: 81 y.o. [place] woman  1. Myeloproliferative neoplasm, atypical CML?  -Her WBC was normal until June 2015. Her total WBC has been significantly increased since Nov 2015, with predominant  neutrophils and eosinophils. Her basophil has been slightly elevated also. Her absolute eosinophils count is in the range 6-8K/ul -Her bone marrow from 04/2014 was hypercellular, with granularcytic proliferation including neutrophilia and eosinophilia. The eosinophil count in bone marrow was 14%  -BCR/ABL and JAK2 gene mutation( -). -Both blood and bone marrow FISH testing for PDGFRA, PGDFRB, FGFR1, BCR-ABL, and CBFB are all negative. -Her skin rash biopsy showed perivascular eosinophil infiltrates, which is supported for hypereosinophil syndrome -Giving the significant elevated eosinophil counts and skin involvement, she meets the criteria for hypereosinophilia syndrome. However reactive eosinophilia is not ruled out. -Her chest CT and echocardiogram were unremarkable.  -Giving the negative PDGFR, she would not benefit from [place]. -Her skin rash responded to steroids very well, but her WBC count has been persistently increasing, despite intermittent steroids therapy. -Her bone marrow biopsy has some features of myeloproliferative neoplasm, given her rapid increase of total white count, I suspect she has myeloproliferative neoplasm. -I reviewed his peripheral blood smear with our heme pathologist Dr. Gari Crown again today. She has significant elevation neutrophil with left shift, increased myelocytes and metamyelocytes, 1-2% blasts, no significant dyspoiesis, morphology are similar to CML. -I will repeat BCR/ABL today  -She could not tolerate Hydrea, had a significant anemia and thrombocytopenia, I have stopped it. -I discussed that the treatment option for atypical CML or other nonspecified MPN is limited. Patient and her family seem to favor palliative care, giving her advance age. She is certainly not a candidate for chemotherapy.  -I'll refer her to [facility] for second opinion, she will be seen next Monday. If not additional treatment options, I think supportive  care alone is very reasonable  -she will see her PCP Dr. Mariea Clonts in 2 days, will address goal of care also.   2. Anemia and thrombocytopenia -Secondary to Hydrea, although the degree of anemia and thrombocytopenia seem to be outer portion of hydration effect -Her hemoglobin 7.1 today, I'll arrange 2 units of RBCs transfusion tomorrow -Plt 33K today, no clinical signs of bleeding.  3. Headaches, fatigue, depression and anorexia   -she will continue Tylenol or vicodin as needed for headaches  - she was switched from Zoloft to mirtazapine, anorexia has improved, however her metastasis he was switched to Zoloft last week after she was admitted to rehabilitation. I will discuss with Dr. Mariea Clonts to see if she is agreeable to switch back  4. Persistent dry cough -Improved with antibiotics  5.  Right colon cancer, pT3N1aM0, stage IIIB, G2, MSI high -I reviewed her surgical past findings with her and her husband, daughter in great details.  -I reviewed her  staging CT of chest, which is negative for metastasis. -Given her locally advanced stage and elevated preoperative CEA, she has high risks of cancer recurrence, approximately 50%, especially in the first few years. -I discussed that the standard care is adjuvant chemotherapy with FOLFOX for stage III colon cancer. However given her advanced age, limited performance status after surgery, I do not think she is a good candidate for adjuvant chemotherapy. Her tumor has high microsatellite instability, which is a good prognostic marker, also predicts a poor response to single agent 5-FU.  -After lengthy discussion, we agreed to observe her closely after surgery. I discussed the surveillance plan, with lab and physical exam every 3-4 months, repeat colonoscopy and CT scan in one year. -We also discussed the treatment option if she has cancer recurrence in the future, which includes surgery, systemic therapy especially immunotherapy for metastatic disease. Clinical  data has shown to accelerate response (70%) with PD1 antibody to colon cancer with MSI high.  -Her CEA has been normal, we'll continue surveillance -I reviewed her restaging CT chest, abdomen and pelvis scan findings with patient and her family members, there no evidence of disease recurrence.  6. GENETICS -She underwent genetic test, which was negative for Lynch syndrome. -Her genetic test from blood revealed LN98 mutation, uncertain this is germline versus somatic mutation related to her hypereosinophilia syndrome. -She denied further somatic mutation test (such as punch skin biopsy)  7.  History of right breast DCIS, ER/PR positive. -The patient previously couldn't tolerate aromatase inhibitor.  -No evidence of disease recurrence on exam. -Continue observation  -Continue yearly mammogram  Follow-up:  -2 units RBC blood transfusion tomorrow -I will refer her to [facility] hematology for a second opinion -I'll see him back in a week  I spent 40 minutes, more than 50% time face to face counseling for her visit.  Truitt Merle, MD  11/20/2014

## 2014-11-21 ENCOUNTER — Encounter: Payer: Medicare Other | Admitting: Internal Medicine

## 2014-11-21 LAB — TYPE AND SCREEN
ABO/RH(D): B NEG
Antibody Screen: NEGATIVE
UNIT DIVISION: 0
Unit division: 0

## 2014-11-22 ENCOUNTER — Telehealth: Payer: Self-pay | Admitting: *Deleted

## 2014-11-22 NOTE — Telephone Encounter (Signed)
FYI Patient's daughter called reporting "Mina Marble has not received a packet..  They said they will call and request but I need to know if we should keep the appointment on 11-26-2014 at 0945."  This nurse will ask H.I.M about request being sent to Dr. Joan Mayans who is new provider at [facility].

## 2014-11-23 ENCOUNTER — Telehealth: Payer: Self-pay

## 2014-11-23 ENCOUNTER — Non-Acute Institutional Stay (SKILLED_NURSING_FACILITY): Payer: Medicare Other | Admitting: Adult Health

## 2014-11-23 ENCOUNTER — Telehealth: Payer: Self-pay | Admitting: *Deleted

## 2014-11-23 DIAGNOSIS — Z7189 Other specified counseling: Secondary | ICD-10-CM

## 2014-11-23 DIAGNOSIS — M79651 Pain in right thigh: Secondary | ICD-10-CM

## 2014-11-23 NOTE — Telephone Encounter (Signed)
Received message on triage voice mail from daughter Williemae Area [phone], wants Dr.Reed to call her, wants to know her condition. Called her back she had talked with [place] after she saw her mother, so Dr. Mariea Clonts doesn't need to call her now.

## 2014-11-23 NOTE — Telephone Encounter (Signed)
Today located provider name as Janyth Contes, Ph: [place], [address], [place], [place]., [address].  No fax number provided.

## 2014-11-23 NOTE — Telephone Encounter (Signed)
I called her daughter [name] back. Pt is not doing well, very fatigued, some mental status change. Betsy requested her provider at rehab to call palliative care consult today. Pt's husband still wants her to go to [facility] for appointment on Monday, but [name] does not feel pt is strong enough to go. She will wait and see how her Mom doing over the weekend and make the final decision on Monday morning. Given her quick deterioration, I think palliative care and hospice is appropriate, I gave her emotional support.   Truitt Merle  11/23/2014

## 2014-11-23 NOTE — Telephone Encounter (Signed)
Received vm call from daughter, [name] asking for return call from Dr Burr Medico regarding confusion about appt for Monday at [facility] also wanted to talk about how her mother was yest.  She can be reached at [phone].

## 2014-11-26 ENCOUNTER — Encounter: Payer: Self-pay | Admitting: Adult Health

## 2014-11-26 ENCOUNTER — Non-Acute Institutional Stay (SKILLED_NURSING_FACILITY): Payer: Medicare Other | Admitting: Adult Health

## 2014-11-26 DIAGNOSIS — D469 Myelodysplastic syndrome, unspecified: Secondary | ICD-10-CM | POA: Diagnosis not present

## 2014-11-26 DIAGNOSIS — R600 Localized edema: Secondary | ICD-10-CM | POA: Diagnosis not present

## 2014-11-26 DIAGNOSIS — E860 Dehydration: Secondary | ICD-10-CM | POA: Diagnosis not present

## 2014-11-26 DIAGNOSIS — J90 Pleural effusion, not elsewhere classified: Secondary | ICD-10-CM | POA: Diagnosis not present

## 2014-11-26 NOTE — Progress Notes (Signed)
Patient ID: April [name], female   DOB: 1925/05/01, 81 y.o.   MRN: [id number]   Location: [facility]  Code Status: DNR   Goals of Care: Advanced Directive information  MOST:  DNR, limited additional interventions (considering do not hospitalize, but trying to manage symptoms here), no feeding tube, IVF and abx short term only  Chief Complaint  Patient presents with  . Acute Visit    leg pain    HPI: 81 y.o. female residing at [facility], rehab section. She has a hx of hypereosinophilic syndrome, colon cancer s/p resection, FTT, headaches, anxiety, depression, GIB, chronic diarrhea, and myeloproliferative neoplasm. She was admitted to rehab on 11/14/14 due to fever, chills, SOB, cough, and weakness. She was diagnosed with HCAP and started on levaquin. Her CXR 10/20  revealed bilateral small pleural effusions but no pneumonia. She has reported right thigh pain for 3 days. Norco was ordered and seems to have provided relief. She has not had any thigh swelling, skin changes, or radicular pain.  She is currently followed by oncology for hypereosinophilia syndrome and possible leukemia. Her WBC continues to trend upward up to 151 on 10/24.  She is scheduled for further eval at [facility] on 10/31  She had a restaging CT of her abdomen pelvis and chest on 11/12/14  which revealed bilateral small pleural effusions, splenomegaly, but no metastatic disease.    Review of Systems:  Review of Systems  Constitutional: Positive for weight loss and malaise/fatigue. Negative for fever, chills and diaphoresis.  HENT: Positive for hearing loss. Negative for congestion.   Eyes: Negative for blurred vision.  Respiratory: Positive for cough. Negative for hemoptysis, sputum production, shortness of breath and wheezing.   Cardiovascular: Negative for chest pain, palpitations and leg swelling.  Gastrointestinal: Negative for abdominal pain, constipation, blood in stool and melena.    Genitourinary: Negative for dysuria.  Musculoskeletal: Positive for myalgias and joint pain. Negative for falls.  Skin: Negative for rash.  Neurological: Positive for weakness. Negative for dizziness.  Endo/Heme/Allergies: Bruises/bleeds easily.  Psychiatric/Behavioral: Positive for depression. Negative for memory loss. The patient is nervous/anxious.     Past Medical History  Diagnosis Date  . History of breast cancer onocologist--  dr Jana Hakim--  no recurrence    dx 2011-- DCIS, Right breast,  ER/PR positive --- s/p lumpectomy  No radiation, took Aromasen 2012  . Senile osteoporosis     Took Fosamax x15years  . GERD (gastroesophageal reflux disease)   . Sigmoid diverticulosis   . Wears hearing aid     bilateral  . PMB (postmenopausal bleeding)   . Colon cancer [facility]) oncologist--  dr Truitt Merle    dx jan 2016  --  Ascending colon carcinoma,  Stage IIIB,  pT3 N1a M0,  Grade 2,  MSI,  1 of 15 nodes +//   s/p  right colectomy  . Hypereosinophilic syndrome   . Chronic diarrhea     Past Surgical History  Procedure Laterality Date  . Cataract extraction w/ intraocular lens  implant, bilateral Bilateral [id number]  . Tonsillectomy and adenoidectomy  1930  . Colonoscopy N/A 01/28/2014    Procedure: COLONOSCOPY;  Surgeon: Missy Sabins, MD;  Location: [facility];  Service: Endoscopy;  Laterality: N/A;  . Esophagogastroduodenoscopy N/A 01/28/2014    Procedure: ESOPHAGOGASTRODUODENOSCOPY (EGD);  Surgeon: Missy Sabins, MD;  Location: [facility];  Service: Endoscopy;  Laterality: N/A;  . Laparoscopic partial right colectomy Right 01/30/2014    Procedure: LAPAROSCOPIC PARTIAL RIGHT COLECTOMY;  Surgeon: Pedro Earls, MD;  Location: [facility];  Service: General;  Laterality: Right;  . Laparotomy N/A 01/31/2014    Procedure: EXPLORATORY LAPAROTOMY, RESECTION OF PRIOR SMALL BOWEL ANASTOMOSIS, UPPER ENDOSCOPY, CREATED A  NEW ILEO COLOSTOMY;  Surgeon: Pedro Earls, MD;  Location: [facility];  Service:  General;  Laterality: N/A;  . Laparoscopic cholecystectomy  05-26-2009  . Breast lumpectomy Right 12-24-2009  . Transthoracic echocardiogram  04-18-2014    mild LVH/  ef 57-01%/  grade I diastolic dysfunction/  mild LAE/  trivial MR and TR  . Dilatation & currettage/hysteroscopy with resectocope N/A 05/16/2014    Procedure: DILATATION & CURETTAGE/HYSTEROSCOPY WITH RESECTOCOPE;  Surgeon: Arvella Nigh, MD;  Location: [facility];  Service: Gynecology;  Laterality: N/A;  . Laparoscopy N/A 05/16/2014    Procedure: LAPAROSCOPY DIAGNOSTIC;  Surgeon: Arvella Nigh, MD;  Location: [facility];  Service: Gynecology;  Laterality: N/A;    Allergies  Allergen Reactions  . Ativan [Lorazepam] Other (See Comments)    confusion  . Codeine Nausea And Vomiting  . Sulfa Antibiotics Other (See Comments)    Unknown childhood reaction      Medication List       This list is accurate as of: 11/23/14 11:59 PM.  Always use your most recent med list.               ALPRAZolam 0.25 MG tablet  Commonly known as:  XANAX  Take 0.25 mg by mouth 2 (two) times daily as needed for anxiety.     Calcium Carbonate-Vitamin D 600-400 MG-UNIT tablet  Take 1 tablet by mouth daily.     CENTRUM SILVER ADULT 50+ PO  Take 1 tablet by mouth daily.     cholecalciferol 1000 UNITS tablet  Commonly known as:  VITAMIN D  Take 4,000 Units by mouth at bedtime.     cyclobenzaprine 5 MG tablet  Commonly known as:  FLEXERIL  Take 1 tablet (5 mg total) by mouth 3 (three) times daily as needed (tension headache).     FUSION PO  Take 1 tablet by mouth daily.     ipratropium-albuterol 0.5-2.5 (3) MG/3ML Soln  Commonly known as:  DUONEB  Take 3 mLs by nebulization every 6 (six) hours as needed. And q 6 prn sob or cough     mirtazapine 15 MG tablet  Commonly known as:  REMERON  Take 1 tablet (15 mg total) by mouth at bedtime.     OVER THE COUNTER MEDICATION  Take 1 tablet by mouth at bedtime.  Macular supplement     potassium chloride SA 20 MEQ tablet  Commonly known as:  K-DUR,KLOR-CON     saccharomyces boulardii 250 MG capsule  Commonly known as:  FLORASTOR  Take 250 mg by mouth 2 (two) times daily.     sertraline 50 MG tablet  Commonly known as:  ZOLOFT  Take 1 tablet (50 mg total) by mouth daily.        Health Maintenance  Topic Date Due  . PNA vac Low Risk Adult (2 of 2 - PCV13) 01/27/2004  . MAMMOGRAM  12/09/2014  . INFLUENZA VACCINE  08/27/2015  . TETANUS/TDAP  01/27/2024  . DEXA SCAN  Completed  . ZOSTAVAX  Completed    Physical Exam: There were no vitals filed for this visit. There is no weight on file to calculate BMI. Physical Exam  Constitutional: She is oriented to person, place, and time. No distress.  HENT:  Head:  Normocephalic and atraumatic.  Neck: No JVD present.  Cardiovascular: Normal rate, regular rhythm and normal heart sounds.   Trace edema to feet  Pulmonary/Chest: Effort normal and breath sounds normal. She has no wheezes. She has no rales. She exhibits no tenderness.  Abdominal: Soft. Bowel sounds are normal. She exhibits no distension. There is no tenderness.  Musculoskeletal: Normal range of motion. She exhibits no edema or tenderness.  Lymphadenopathy:    She has no cervical adenopathy.  Neurological: She is alert and oriented to person, place, and time.  Skin: Skin is warm and dry. She is not diaphoretic.  Psychiatric: She has a normal mood and affect.    Labs reviewed: Basic Metabolic Panel:  Recent Labs  01/25/14 0643  02/07/14 0900 02/08/14 0515 02/09/14 0526  10/22/14 1424 10/26/14 0825 10/31/14 1309 11/15/14  NA 140  < > 136 140 137  < > 139 141 140 138  K 4.1  < > 3.3* 3.8 3.6  < > 3.3* 4.1 4.0 3.2*  CL 112  < > 105 105 105  --   --   --   --   --   CO2 23  < > 25 26 26   < > 25 21* 25  --   GLUCOSE 117*  < > 104* 88 91  < > 142* 127 132  --   BUN 13  < > 10 9 9   < > 12.1 13.2 20.1 10  CREATININE 0.63  < >  0.60 0.68 0.66  < > 0.8 0.8 0.8 0.6  CALCIUM 7.8*  < > 8.1* 8.5 8.3*  < > 9.1 9.5 9.4  --   MG 1.8  --  1.8 2.2  --   --   --   --   --   --   PHOS 3.2  --   --   --   --   --   --   --   --   --   TSH 4.055  --   --   --   --   --   --   --   --   --   < > = values in this interval not displayed. Liver Function Tests:  Recent Labs  10/22/14 1424 10/26/14 0825 10/31/14 1309  AST 19 20 14   ALT 22 26 17   ALKPHOS 88 79 64  BILITOT 0.68 0.65 0.68  PROT 6.7 6.4 6.4  ALBUMIN 3.9 3.6 3.8   No results for input(s): LIPASE, AMYLASE in the last 8760 hours. No results for input(s): AMMONIA in the last 8760 hours. CBC:  Recent Labs  10/31/14 1308 11/07/14 0937 11/12/14 1413 11/15/14 11/19/14 1320  WBC 44.8* 14.0* 48.6* 68.7 151.7*  NEUTROABS 40.1* 11.3* 34.8*  --   --   HGB 10.8* 8.8* 8.9* 7.4* 7.1*  HCT 32.6* 27.2* 27.8* 21* 22.7*  MCV 82.3 84.0 83.9  --  85.6  PLT 76 Large & giant platelets* 37* 53* 35* 33*    Assessment/Plan  1) Leg pain -?etiology, possibly due to myelodysplasia -no asymmetric swelling or warmth, or skin changes -not exacerbated by ambulation  -continue norco prn for pain  2) Advanced care planning -I discussed her care today with her daughter, [name], as well as her husband, Mr. Rudden. [name] would like Ms. Tedeschi to move forward with palliative care and go home rather than stay in rehab. Mr. Garland is not in agreement with this at this point. He would like her to  stay in rehab and consider AL enhanced in the future. They are focused on providing quality of care at this point rather than lengthening her life. However, at times they still seem to be struggling with accepting their decision. Ms. Sigler agrees with staying in rehab and is concerned of the overall burden that this places on her husband. I explained to them the difference between hospice and palliative care. They are not ready to accept these services at this time. I am ok with this because [facility] is  managing their needs well and they have a most form filled out, as well as a DNR.     Cindi Carbon, ANP [facility] [phone]

## 2014-11-26 NOTE — Progress Notes (Addendum)
Patient ID: April [name], female   DOB: August 27, 1925, 81 y.o.   MRN: [id number]   Location: [facility]  Code Status: DNR   Goals of Care: Advanced Directive information  MOST done today:  DNR, limited additional interventions (considering do not hospitalize, but trying to manage symptoms here), no feeding tube, IVF and abx short term only  Chief Complaint  Patient presents with  . Acute Visit    edema, diaphoresis    HPI: 81 y.o. female residing at [facility], rehab section. She has a hx of hypereosinophilic syndrome, colon cancer stage IIIb s/p resection, FTT, headaches, anxiety, depression, GIB, chronic diarrhea, and myeloproliferative neoplasm. She was admitted to rehab on 11/14/14 due to fever, chills, SOB, cough, and weakness. She was diagnosed with HCAP and started on levaquin. Her CXR 10/20  revealed bilateral small pleural effusions but no pneumonia.  She has been working with PT and OT but remains deconditioned. She continues to have right thigh pain and reports that norco helps. Over the weekend she had significant swelling in both legs, as well as diaphoresis without fever, and burning in the throat when taking duoneb. Her last echo on 3/23 shows an EF of 55 % with grade 1 diastolic dysfunction, no valvular dz. She has not had any CP or SOB, no erythema, warmth or pain to the calves, but continues with a dry cough which has overall improved since admission to rehab. She is followed by Dr. Annamaria Boots with oncology. Her blood work has progressively gotten worse with elevations in WBC 151 on 10/24.  She has decided to forego any further aggressive work up or treatment for this myelodysplastic process. She received  2 units of PRBCS on 10/28 f/u CBC is due at Dr. Fritz Pickerel office on 11/2.  She had a restaging CT of her abdomen pelvis and chest on 11/12/14  which revealed bilateral small pleural effusions, splenomegaly, but no metastatic disease.       Review of  Systems:  Review of Systems  Constitutional: Positive for chills, weight loss, malaise/fatigue and diaphoresis. Negative for fever.  HENT: Positive for hearing loss. Negative for congestion.   Eyes: Negative for blurred vision.  Respiratory: Positive for cough. Negative for hemoptysis, sputum production, shortness of breath and wheezing.   Cardiovascular: Positive for leg swelling. Negative for chest pain, palpitations, orthopnea, claudication and PND.  Gastrointestinal: Negative for abdominal pain, constipation, blood in stool and melena.  Genitourinary: Negative for dysuria.  Musculoskeletal: Positive for myalgias. Negative for falls.  Skin: Negative for rash.  Neurological: Positive for weakness. Negative for dizziness.  Endo/Heme/Allergies: Bruises/bleeds easily.  Psychiatric/Behavioral: Positive for depression. Negative for memory loss. The patient is nervous/anxious.     Past Medical History  Diagnosis Date  . History of breast cancer onocologist--  dr Jana Hakim--  no recurrence    dx 2011-- DCIS, Right breast,  ER/PR positive --- s/p lumpectomy  No radiation, took Aromasen 2012  . Senile osteoporosis     Took Fosamax x15years  . GERD (gastroesophageal reflux disease)   . Sigmoid diverticulosis   . Wears hearing aid     bilateral  . PMB (postmenopausal bleeding)   . Colon cancer [facility]) oncologist--  dr Truitt Merle    dx jan 2016  --  Ascending colon carcinoma,  Stage IIIB,  pT3 N1a M0,  Grade 2,  MSI,  1 of 15 nodes +//   s/p  right colectomy  . Hypereosinophilic syndrome   . Chronic diarrhea  Past Surgical History  Procedure Laterality Date  . Cataract extraction w/ intraocular lens  implant, bilateral Bilateral [id number]  . Tonsillectomy and adenoidectomy  1930  . Colonoscopy N/A 01/28/2014    Procedure: COLONOSCOPY;  Surgeon: Missy Sabins, MD;  Location: [facility];  Service: Endoscopy;  Laterality: N/A;  . Esophagogastroduodenoscopy N/A 01/28/2014    Procedure:  ESOPHAGOGASTRODUODENOSCOPY (EGD);  Surgeon: Missy Sabins, MD;  Location: [facility];  Service: Endoscopy;  Laterality: N/A;  . Laparoscopic partial right colectomy Right 01/30/2014    Procedure: LAPAROSCOPIC PARTIAL RIGHT COLECTOMY;  Surgeon: Pedro Earls, MD;  Location: [facility];  Service: General;  Laterality: Right;  . Laparotomy N/A 01/31/2014    Procedure: EXPLORATORY LAPAROTOMY, RESECTION OF PRIOR SMALL BOWEL ANASTOMOSIS, UPPER ENDOSCOPY, CREATED A  NEW ILEO COLOSTOMY;  Surgeon: Pedro Earls, MD;  Location: [facility];  Service: General;  Laterality: N/A;  . Laparoscopic cholecystectomy  05-26-2009  . Breast lumpectomy Right 12-24-2009  . Transthoracic echocardiogram  04-18-2014    mild LVH/  ef 54-09%/  grade I diastolic dysfunction/  mild LAE/  trivial MR and TR  . Dilatation & currettage/hysteroscopy with resectocope N/A 05/16/2014    Procedure: DILATATION & CURETTAGE/HYSTEROSCOPY WITH RESECTOCOPE;  Surgeon: Arvella Nigh, MD;  Location: [facility];  Service: Gynecology;  Laterality: N/A;  . Laparoscopy N/A 05/16/2014    Procedure: LAPAROSCOPY DIAGNOSTIC;  Surgeon: Arvella Nigh, MD;  Location: [facility];  Service: Gynecology;  Laterality: N/A;    Allergies  Allergen Reactions  . Ativan [Lorazepam] Other (See Comments)    confusion  . Codeine Nausea And Vomiting  . Sulfa Antibiotics Other (See Comments)    Unknown childhood reaction      Medication List       This list is accurate as of: 11/26/14 11:53 AM.  Always use your most recent med list.               ALPRAZolam 0.25 MG tablet  Commonly known as:  XANAX  Take 0.25 mg by mouth 2 (two) times daily as needed for anxiety.     Calcium Carbonate-Vitamin D 600-400 MG-UNIT tablet  Take 1 tablet by mouth daily.     CENTRUM SILVER ADULT 50+ PO  Take 1 tablet by mouth daily.     cholecalciferol 1000 UNITS tablet  Commonly known as:  VITAMIN D  Take 4,000 Units by mouth at bedtime.      cyclobenzaprine 5 MG tablet  Commonly known as:  FLEXERIL  Take 1 tablet (5 mg total) by mouth 3 (three) times daily as needed (tension headache).     FUSION PO  Take 1 tablet by mouth daily.     HYDROcodone-acetaminophen 7.5-325 MG tablet  Commonly known as:  NORCO  Take 1-2 tablets by mouth every 6 (six) hours as needed for moderate pain.     ipratropium-albuterol 0.5-2.5 (3) MG/3ML Soln  Commonly known as:  DUONEB  Take 3 mLs by nebulization every 6 (six) hours as needed. And q 6 prn sob or cough     mirtazapine 15 MG tablet  Commonly known as:  REMERON  Take 1 tablet (15 mg total) by mouth at bedtime.     OVER THE COUNTER MEDICATION  Take 1 tablet by mouth at bedtime. Macular supplement     potassium chloride SA 20 MEQ tablet  Commonly known as:  K-DUR,KLOR-CON     saccharomyces boulardii 250 MG capsule  Commonly known as:  FLORASTOR  Take  250 mg by mouth 2 (two) times daily.     sertraline 50 MG tablet  Commonly known as:  ZOLOFT  Take 1 tablet (50 mg total) by mouth daily.        Health Maintenance  Topic Date Due  . PNA vac Low Risk Adult (2 of 2 - PCV13) 01/27/2004  . MAMMOGRAM  12/09/2014  . INFLUENZA VACCINE  08/27/2015  . TETANUS/TDAP  01/27/2024  . DEXA SCAN  Completed  . ZOSTAVAX  Completed    Physical Exam: Filed Vitals:   11/26/14 1147  BP: 110/57  Pulse: 96  Temp: 97.5 F (36.4 C)  Resp: 18  SpO2: 94%   There is no weight on file to calculate BMI. Physical Exam  Constitutional: She is oriented to person, place, and time. No distress.  frail  HENT:  Head: Normocephalic and atraumatic.  Mouth/Throat: Oropharynx is clear and moist. No oropharyngeal exudate.  Dry mucous membranes  Neck: No JVD present.  Cardiovascular: Normal rate, regular rhythm and normal heart sounds.   +1 BLE edema  Pulmonary/Chest: Effort normal. She has no wheezes. She has no rales. She exhibits no tenderness.  Decreased bases  Abdominal: Soft. Bowel sounds are  normal. She exhibits no distension. There is no tenderness.  Musculoskeletal: Normal range of motion. She exhibits no edema.  Lymphadenopathy:    She has no cervical adenopathy.       Right: No inguinal adenopathy present.       Left: No inguinal adenopathy present.  Neurological: She is alert and oriented to person, place, and time.  Skin: Skin is warm. She is diaphoretic.  Psychiatric: She has a normal mood and affect.    Labs reviewed: Basic Metabolic Panel:  Recent Labs  01/25/14 0643  02/07/14 0900 02/08/14 0515 02/09/14 0526  10/22/14 1424 10/26/14 0825 10/31/14 1309 11/15/14  NA 140  < > 136 140 137  < > 139 141 140 138  K 4.1  < > 3.3* 3.8 3.6  < > 3.3* 4.1 4.0 3.2*  CL 112  < > 105 105 105  --   --   --   --   --   CO2 23  < > 25 26 26   < > 25 21* 25  --   GLUCOSE 117*  < > 104* 88 91  < > 142* 127 132  --   BUN 13  < > 10 9 9   < > 12.1 13.2 20.1 10  CREATININE 0.63  < > 0.60 0.68 0.66  < > 0.8 0.8 0.8 0.6  CALCIUM 7.8*  < > 8.1* 8.5 8.3*  < > 9.1 9.5 9.4  --   MG 1.8  --  1.8 2.2  --   --   --   --   --   --   PHOS 3.2  --   --   --   --   --   --   --   --   --   TSH 4.055  --   --   --   --   --   --   --   --   --   < > = values in this interval not displayed. Liver Function Tests:  Recent Labs  10/22/14 1424 10/26/14 0825 10/31/14 1309  AST 19 20 14   ALT 22 26 17   ALKPHOS 88 79 64  BILITOT 0.68 0.65 0.68  PROT 6.7 6.4 6.4  ALBUMIN 3.9 3.6 3.8  No results for input(s): LIPASE, AMYLASE in the last 8760 hours. No results for input(s): AMMONIA in the last 8760 hours. CBC:  Recent Labs  10/31/14 1308 11/07/14 0937 11/12/14 1413 11/15/14 11/19/14 1320  WBC 44.8* 14.0* 48.6* 68.7 151.7*  NEUTROABS 40.1* 11.3* 34.8*  --   --   HGB 10.8* 8.8* 8.9* 7.4* 7.1*  HCT 32.6* 27.2* 27.8* 21* 22.7*  MCV 82.3 84.0 83.9  --  85.6  PLT 76 Large & giant platelets* 37* 53* 35* 33*    Assessment/Plan  1) Edema -no s/s of CHF echo with Ef 55%  -no groin  adenopathy -possibly due to low albumin, also ?ARF -?related to myelodysplastic syndrome -check BMP stat today -keep legs elevated -check right lower ext doppler to r/o dvt  2) Dehydration -has dry mucous membranes with diaphoresis  -may need IVF -check electrolytes today and address accordingly  3)Myelodysplasia -palliative goals of care -defer to oncology -f/u apt Wednesday 11/2 with Dr. Annamaria Boots -they have decided not to pursue further work up at [facility] -leg pain treated with norco and this seems to help  4) Pleural effusions -small on last CXR, re eval today   They are holding off on hospice or palliative care right now. They already have specific goals of care in place that are based in comfort. Mr.and Mrs. Mcelwee continue to work through their feelings on this process and her overall prognosis, which appears poor at this point.   Cindi Carbon, ANP [facility] [phone]

## 2014-11-27 ENCOUNTER — Non-Acute Institutional Stay (SKILLED_NURSING_FACILITY): Payer: Medicare Other | Admitting: Internal Medicine

## 2014-11-27 DIAGNOSIS — G893 Neoplasm related pain (acute) (chronic): Secondary | ICD-10-CM | POA: Diagnosis not present

## 2014-11-27 DIAGNOSIS — R627 Adult failure to thrive: Secondary | ICD-10-CM

## 2014-11-27 DIAGNOSIS — J90 Pleural effusion, not elsewhere classified: Secondary | ICD-10-CM

## 2014-11-27 DIAGNOSIS — D471 Chronic myeloproliferative disease: Secondary | ICD-10-CM

## 2014-11-27 DIAGNOSIS — M899 Disorder of bone, unspecified: Secondary | ICD-10-CM

## 2014-11-27 DIAGNOSIS — M898X9 Other specified disorders of bone, unspecified site: Secondary | ICD-10-CM

## 2014-11-27 NOTE — Progress Notes (Signed)
Patient ID: April [name], female   DOB: March 30, 1925, 81 y.o.   MRN: [id number]  Location:  [facility] Provider:  Rexene Edison. Palma Buster, D.O., C.M.D. Hollace Kinnier, DO  Code Status:  DNR Goals of care: Advanced Directive information Does patient have an advance directive?: Yes, Type of Advance Directive: [place];Living will;Out of facility DNR (pink MOST or yellow form), Pre-existing out of facility DNR order (yellow form or pink MOST form): Yellow form placed in chart (order not valid for inpatient use);Pink MOST form placed in chart (order not valid for inpatient use), Does patient want to make changes to advanced directive?: No - Patient declined  Chief Complaint  Patient presents with  . Acute Visit    declining status; worsening pleural effusions/opacities on CXR    HPI:  Pt is a 81 y.o. white female seen today for an acute visit due to above. She is having increased pain in both thighs not relieved with tylenol, muscle relaxers or prn norco--having breakthrough pain between doses.  She decided not to go to [facility] for the specialist opinion--she was feeling too poorly and she and her family did not think it would change her mgt.  [name] met with them and discussed hospice care some at the request of their daughter, April [name].  [name] and Shernell did not feel ready at that time and wanted a more definitive prognosis from me.  She is feeling weaker and not able to do as much with therapy.  CXR showed more prominent effusions which we suspect are related to her malignancy.  We discussed a prognosis of weeks to months but so much depends on how quickly her pain and dyspnea progress. She is clear she wants to stay in rehab though she wishes she could sleep next to her husband b/c she has for so many years.  [name] and [name] are thinking about hospice and will let us know a final decision.  Review of Systems  Constitutional: Positive for weight loss and malaise/fatigue. Negative for  fever and chills.  HENT: Negative for congestion.   Eyes: Negative for blurred vision.  Respiratory: Positive for shortness of breath.   Cardiovascular: Negative for chest pain and leg swelling.  Gastrointestinal: Negative for abdominal pain.  Genitourinary: Negative for dysuria.  Musculoskeletal: Positive for myalgias.       Bilateral thigh pain  Skin: Negative for rash.  Neurological: Positive for weakness and headaches. Negative for dizziness and loss of consciousness.  Endo/Heme/Allergies: Bruises/bleeds easily.  Psychiatric/Behavioral: Positive for depression. Negative for memory loss.    Past Medical History  Diagnosis Date  . History of breast cancer onocologist--  dr Jana Hakim--  no recurrence    dx 2011-- DCIS, Right breast,  ER/PR positive --- s/p lumpectomy  No radiation, took Aromasen 2012  . Senile osteoporosis     Took Fosamax x15years  . GERD (gastroesophageal reflux disease)   . Sigmoid diverticulosis   . Wears hearing aid     bilateral  . PMB (postmenopausal bleeding)   . Colon cancer [facility]) oncologist--  dr Truitt Merle    dx jan 2016  --  Ascending colon carcinoma,  Stage IIIB,  pT3 N1a M0,  Grade 2,  MSI,  1 of 15 nodes +//   s/p  right colectomy  . Hypereosinophilic syndrome   . Chronic diarrhea    Past Surgical History  Procedure Laterality Date  . Cataract extraction w/ intraocular lens  implant, bilateral Bilateral 4818,5631  . Tonsillectomy and adenoidectomy  1930  . Colonoscopy N/A 01/28/2014    Procedure: COLONOSCOPY;  Surgeon: Missy Sabins, MD;  Location: [facility];  Service: Endoscopy;  Laterality: N/A;  . Esophagogastroduodenoscopy N/A 01/28/2014    Procedure: ESOPHAGOGASTRODUODENOSCOPY (EGD);  Surgeon: Missy Sabins, MD;  Location: [facility];  Service: Endoscopy;  Laterality: N/A;  . Laparoscopic partial right colectomy Right 01/30/2014    Procedure: LAPAROSCOPIC PARTIAL RIGHT COLECTOMY;  Surgeon: Pedro Earls, MD;  Location: [facility];  Service:  General;  Laterality: Right;  . Laparotomy N/A 01/31/2014    Procedure: EXPLORATORY LAPAROTOMY, RESECTION OF PRIOR SMALL BOWEL ANASTOMOSIS, UPPER ENDOSCOPY, CREATED A  NEW ILEO COLOSTOMY;  Surgeon: Pedro Earls, MD;  Location: [facility];  Service: General;  Laterality: N/A;  . Laparoscopic cholecystectomy  05-26-2009  . Breast lumpectomy Right 12-24-2009  . Transthoracic echocardiogram  04-18-2014    mild LVH/  ef 97-67%/  grade I diastolic dysfunction/  mild LAE/  trivial MR and TR  . Dilatation & currettage/hysteroscopy with resectocope N/A 05/16/2014    Procedure: DILATATION & CURETTAGE/HYSTEROSCOPY WITH RESECTOCOPE;  Surgeon: Arvella Nigh, MD;  Location: [facility];  Service: Gynecology;  Laterality: N/A;  . Laparoscopy N/A 05/16/2014    Procedure: LAPAROSCOPY DIAGNOSTIC;  Surgeon: Arvella Nigh, MD;  Location: [facility];  Service: Gynecology;  Laterality: N/A;    Allergies  Allergen Reactions  . Ativan [Lorazepam] Other (See Comments)    confusion  . Codeine Nausea And Vomiting  . Sulfa Antibiotics Other (See Comments)    Unknown childhood reaction      Medication List       This list is accurate as of: 11/27/14 11:59 PM.  Always use your most recent med list.               ALPRAZolam 0.25 MG tablet  Commonly known as:  XANAX  Take 0.25 mg by mouth 2 (two) times daily as needed for anxiety.     Calcium Carbonate-Vitamin D 600-400 MG-UNIT tablet  Take 1 tablet by mouth daily.     CENTRUM SILVER ADULT 50+ PO  Take 1 tablet by mouth daily.     cholecalciferol 1000 UNITS tablet  Commonly known as:  VITAMIN D  Take 4,000 Units by mouth at bedtime.     cyclobenzaprine 5 MG tablet  Commonly known as:  FLEXERIL  Take 1 tablet (5 mg total) by mouth 3 (three) times daily as needed (tension headache).     FUSION PO  Take 1 tablet by mouth daily.     HYDROcodone-acetaminophen 7.5-325 MG tablet  Commonly known as:  NORCO  Take 1-2 tablets by  mouth every 6 (six) hours as needed for moderate pain.     ipratropium-albuterol 0.5-2.5 (3) MG/3ML Soln  Commonly known as:  DUONEB  Take 3 mLs by nebulization every 6 (six) hours as needed. And q 6 prn sob or cough     mirtazapine 15 MG tablet  Commonly known as:  REMERON  Take 1 tablet (15 mg total) by mouth at bedtime.     OVER THE COUNTER MEDICATION  Take 1 tablet by mouth at bedtime. Macular supplement     potassium chloride SA 20 MEQ tablet  Commonly known as:  K-DUR,KLOR-CON     saccharomyces boulardii 250 MG capsule  Commonly known as:  FLORASTOR  Take 250 mg by mouth 2 (two) times daily.     sertraline 50 MG tablet  Commonly known as:  ZOLOFT  Take 1  tablet (50 mg total) by mouth daily.        Immunization History  Administered Date(s) Administered  . Influenza,inj,Quad PF,36+ Mos 10/10/2014  . Influenza-Unspecified 10/26/2013  . Pneumococcal-Unspecified 01/27/2003  . Td 07/26/2002, 01/26/2014  . Zoster 09/26/2005   Pertinent  Health Maintenance Due  Topic Date Due  . PNA vac Low Risk Adult (2 of 2 - PCV13) 01/27/2004  . MAMMOGRAM  12/09/2014  . INFLUENZA VACCINE  08/27/2015  . DEXA SCAN  Completed   Fall Risk  05/23/2014 04/25/2014  Falls in the past year? No No    Filed Vitals:   11/27/14 0836  BP: 107/51  Pulse: 91  Temp: 97.3 F (36.3 C)  Resp: 19  Height: 5' 5"  (1.651 m)  Weight: 128 lb 3.2 oz (58.151 kg)  SpO2: 94%   Body mass index is 21.33 kg/(m^2). Physical Exam  Constitutional: She is oriented to person, place, and time.  Increasingly cachectic female resting in recliner chair in her room, her husband and daughter are here  Cardiovascular: Normal rate, regular rhythm, normal heart sounds and intact distal pulses.   Pulmonary/Chest: Effort normal.  Diminished breath sounds at bases bilaterally  Musculoskeletal: Normal range of motion. She exhibits tenderness.  Of bilateral upper thighs  Neurological: She is alert and oriented to  person, place, and time.  Skin: Skin is warm and dry.  Psychiatric:  Sad affect    Labs reviewed:  Recent Labs  01/25/14 0643  02/07/14 0900 02/08/14 0515 02/09/14 0526  10/22/14 1424 10/26/14 0825 10/31/14 1309 11/15/14  NA 140  < > 136 140 137  < > 139 141 140 138  K 4.1  < > 3.3* 3.8 3.6  < > 3.3* 4.1 4.0 3.2*  CL 112  < > 105 105 105  --   --   --   --   --   CO2 23  < > 25 26 26   < > 25 21* 25  --   GLUCOSE 117*  < > 104* 88 91  < > 142* 127 132  --   BUN 13  < > 10 9 9   < > 12.1 13.2 20.1 10  CREATININE 0.63  < > 0.60 0.68 0.66  < > 0.8 0.8 0.8 0.6  CALCIUM 7.8*  < > 8.1* 8.5 8.3*  < > 9.1 9.5 9.4  --   MG 1.8  --  1.8 2.2  --   --   --   --   --   --   PHOS 3.2  --   --   --   --   --   --   --   --   --   < > = values in this interval not displayed.  Recent Labs  10/22/14 1424 10/26/14 0825 10/31/14 1309  AST 19 20 14   ALT 22 26 17   ALKPHOS 88 79 64  BILITOT 0.68 0.65 0.68  PROT 6.7 6.4 6.4  ALBUMIN 3.9 3.6 3.8    Recent Labs  10/31/14 1308 11/07/14 0937 11/12/14 1413 11/15/14 11/19/14 1320  WBC 44.8* 14.0* 48.6* 68.7 151.7*  NEUTROABS 40.1* 11.3* 34.8*  --   --   HGB 10.8* 8.8* 8.9* 7.4* 7.1*  HCT 32.6* 27.2* 27.8* 21* 22.7*  MCV 82.3 84.0 83.9  --  85.6  PLT 76 Large & giant platelets* 37* 53* 35* 33*   Lab Results  Component Value Date   TSH 4.055 01/25/2014   No results found  for: HGBA1C Lab Results  Component Value Date   CHOL 58 11/15/2014   HDL 5* 11/15/2014   LDLCALC 27 11/15/2014   TRIG 128 11/15/2014    Significant Diagnostic Results in last 30 days:  Ct Chest W Contrast  11/12/2014  CLINICAL DATA:  Colon cancer diagnosed January 2016, status post resection, for restaging. History of breast cancer diagnosed 2011 status post right lumpectomy. Prior cholecystectomy. Persistent dry cough x1 week. EXAM: CT CHEST, ABDOMEN, AND PELVIS WITH CONTRAST TECHNIQUE: Multidetector CT imaging of the chest, abdomen and pelvis was performed  following the standard protocol during bolus administration of intravenous contrast. CONTRAST:  62m OMNIPAQUE IOHEXOL 300 MG/ML  SOLN COMPARISON:  CT chest dated 03/16/2014. CT abdomen pelvis dated 01/25/2015. FINDINGS: CT CHEST FINDINGS Mediastinum/Nodes: The heart is normal in size. No pericardial effusion. Atherosclerotic calcifications of the aortic arch. Ectasia of the ascending thoracic aorta, measuring 3.4 cm. 8 mm short axis high right paratracheal node (series 2/ image 15), within normal limits. No suspicious mediastinal, hilar, or axillary lymphadenopathy. Visualized thyroid is unremarkable. Lungs/Pleura: Evaluation of lung parenchyma (particularly the upper lobes) is constrained by respiratory motion. No suspicious pulmonary nodules. Mild lingular opacity, likely atelectasis. Mild dependent/compressive atelectasis in the bilateral lower lobes. Small bilateral pleural effusions. No pneumothorax. Musculoskeletal: Status post lumpectomy with postsurgical changes in the lower outer right breast (series 2/images 32-33). Degenerative changes of the thoracic spine. CT ABDOMEN PELVIS FINDINGS Motion degraded images. Hepatobiliary: Liver is within normal limits. No suspicious/enhancing hepatic lesions. Status post cholecystectomy. No intrahepatic or extrahepatic ductal dilatation. Pancreas: Within normal limits. Spleen: Splenomegaly, measuring 17.4 cm in maximal craniocaudal dimension, new. Adrenals/Urinary Tract: Adrenal glands are within normal limits. Kidneys are within normal limits.  No hydronephrosis. Bladder is unremarkable. Stomach/Bowel: Stomach is within normal limits. Status post ileocecal resection with anastomosis in the right mid abdomen (series 2/image 86). Appendix is surgically absent. Sigmoid diverticulosis, without evidence of diverticulitis. Vascular/Lymphatic: No evidence of abdominal aortic aneurysm. No suspicious abdominopelvic lymphadenopathy. Reproductive: Uterus is within normal limits.  Bilateral ovaries are within normal limits. Other: No abdominopelvic ascites. Musculoskeletal: Mild inferior endplate changes at L1, chronic. Moderate superior endplate compression fracture deformity at L4 (sagittal image 58), with approximately 40% loss of height, new. IMPRESSION: Motion degraded images. Status post ileocecal resection. No evidence of recurrent or metastatic disease. Splenomegaly, new. Small bilateral pleural effusions, new. Moderate compression fracture deformity at L4, new. Additional stable ancillary findings as above. Electronically Signed   By: SJulian HyM.D.   On: 11/12/2014 15:20   Ct Abdomen Pelvis W Contrast  11/12/2014  CLINICAL DATA:  Colon cancer diagnosed January 2016, status post resection, for restaging. History of breast cancer diagnosed 2011 status post right lumpectomy. Prior cholecystectomy. Persistent dry cough x1 week. EXAM: CT CHEST, ABDOMEN, AND PELVIS WITH CONTRAST TECHNIQUE: Multidetector CT imaging of the chest, abdomen and pelvis was performed following the standard protocol during bolus administration of intravenous contrast. CONTRAST:  757mOMNIPAQUE IOHEXOL 300 MG/ML  SOLN COMPARISON:  CT chest dated 03/16/2014. CT abdomen pelvis dated 01/25/2015. FINDINGS: CT CHEST FINDINGS Mediastinum/Nodes: The heart is normal in size. No pericardial effusion. Atherosclerotic calcifications of the aortic arch. Ectasia of the ascending thoracic aorta, measuring 3.4 cm. 8 mm short axis high right paratracheal node (series 2/ image 15), within normal limits. No suspicious mediastinal, hilar, or axillary lymphadenopathy. Visualized thyroid is unremarkable. Lungs/Pleura: Evaluation of lung parenchyma (particularly the upper lobes) is constrained by respiratory motion. No suspicious pulmonary nodules. Mild lingular opacity, likely  atelectasis. Mild dependent/compressive atelectasis in the bilateral lower lobes. Small bilateral pleural effusions. No pneumothorax.  Musculoskeletal: Status post lumpectomy with postsurgical changes in the lower outer right breast (series 2/images 32-33). Degenerative changes of the thoracic spine. CT ABDOMEN PELVIS FINDINGS Motion degraded images. Hepatobiliary: Liver is within normal limits. No suspicious/enhancing hepatic lesions. Status post cholecystectomy. No intrahepatic or extrahepatic ductal dilatation. Pancreas: Within normal limits. Spleen: Splenomegaly, measuring 17.4 cm in maximal craniocaudal dimension, new. Adrenals/Urinary Tract: Adrenal glands are within normal limits. Kidneys are within normal limits.  No hydronephrosis. Bladder is unremarkable. Stomach/Bowel: Stomach is within normal limits. Status post ileocecal resection with anastomosis in the right mid abdomen (series 2/image 86). Appendix is surgically absent. Sigmoid diverticulosis, without evidence of diverticulitis. Vascular/Lymphatic: No evidence of abdominal aortic aneurysm. No suspicious abdominopelvic lymphadenopathy. Reproductive: Uterus is within normal limits. Bilateral ovaries are within normal limits. Other: No abdominopelvic ascites. Musculoskeletal: Mild inferior endplate changes at L1, chronic. Moderate superior endplate compression fracture deformity at L4 (sagittal image 58), with approximately 40% loss of height, new. IMPRESSION: Motion degraded images. Status post ileocecal resection. No evidence of recurrent or metastatic disease. Splenomegaly, new. Small bilateral pleural effusions, new. Moderate compression fracture deformity at L4, new. Additional stable ancillary findings as above. Electronically Signed   By: Julian Hy M.D.   On: 11/12/2014 15:20    Assessment/Plan 1. Myeloproliferative neoplasm ([place]) -cont comfort care here at [facility] -she has decided not to go out to anymore appts at this time and April [name] has discussed this with Dr. Burr Medico -they are thinking seriously now about hospice to help with family support and pain mgt  2. Malignant  bone pain -schedule hydrocodone--anticipate she will need a more potent medication soon if she continues with breakthrough pain or is getting inadequate relief with this and tylenol--she does not like to take medications  3. FTT (failure to thrive) in adult -she is losing weight slowly and intake has not been good for months due to #1 -cont supplements and foods she likes as tolerated  4. Pleural effusion, bilateral -felt to be due to #1 also -has IS to use, but getting weaker and having more pain so up less and less able to take good breaths  Family/ staff Communication: met with family and also recommended hospice care and they seem like they will accept this recommendation after some more discussion  Labs/tests ordered:  No further labs at this time due to goals of care  Angels. Jonah Gingras, D.O. [facility] [address], [place] [address] Cell Phone (Mon-Fri 8am-5pm):  [phone] On Call:  [phone] & follow prompts after 5pm & weekends Office Phone:  [phone] Office Fax:  [phone]

## 2014-11-29 ENCOUNTER — Other Ambulatory Visit: Payer: Medicare Other

## 2014-11-29 ENCOUNTER — Encounter: Payer: Medicare Other | Admitting: Hematology

## 2014-11-29 NOTE — Progress Notes (Signed)
I spoke with her daughter [name]. Pt did not come to her appointment with me today. She called twice a few days ago to cancel her appointment with me today. Her mother is not doing well, has lot of pain in her legs, hospice staff is coming to meet pt and her family tomorrow. I supported their decision. I'll be happy to assist in anyway, she was very appreciative.  Truitt Merle  11/29/2014  This encounter was created in error - please disregard. This encounter was created in error - please disregard.

## 2014-12-04 ENCOUNTER — Non-Acute Institutional Stay (SKILLED_NURSING_FACILITY): Payer: Medicare Other | Admitting: Internal Medicine

## 2014-12-04 ENCOUNTER — Encounter: Payer: Self-pay | Admitting: Internal Medicine

## 2014-12-04 DIAGNOSIS — Z789 Other specified health status: Secondary | ICD-10-CM | POA: Diagnosis not present

## 2014-12-04 DIAGNOSIS — G893 Neoplasm related pain (acute) (chronic): Secondary | ICD-10-CM

## 2014-12-04 DIAGNOSIS — M898X9 Other specified disorders of bone, unspecified site: Secondary | ICD-10-CM

## 2014-12-04 DIAGNOSIS — M899 Disorder of bone, unspecified: Secondary | ICD-10-CM

## 2014-12-04 DIAGNOSIS — R627 Adult failure to thrive: Secondary | ICD-10-CM | POA: Diagnosis not present

## 2014-12-04 DIAGNOSIS — J90 Pleural effusion, not elsewhere classified: Secondary | ICD-10-CM

## 2014-12-04 DIAGNOSIS — D471 Chronic myeloproliferative disease: Secondary | ICD-10-CM

## 2014-12-08 ENCOUNTER — Encounter: Payer: Self-pay | Admitting: Internal Medicine

## 2014-12-08 NOTE — Progress Notes (Signed)
Patient ID: April [name], female   DOB: 1925/03/14, 81 y.o.   MRN: [id number]  Location:  [facility] Provider:  Rexene Edison. Wm Sahagun, D.O., C.M.D. Hollace Kinnier, DO  Code Status:  DNR Goals of care: Advanced Directive information Does patient have an advance directive?: Yes, Type of Advance Directive: [place];Living will;Out of facility DNR (pink MOST or yellow form), Pre-existing out of facility DNR order (yellow form or pink MOST form): Yellow form placed in chart (order not valid for inpatient use);Pink MOST form placed in chart (order not valid for inpatient use)  Chief Complaint  Patient presents with  . Acute Visit    continued decline, now on hospice    HPI:  Pt is a 81 y.o.white female with myeloproliferative neoplasm, prior colon cancer s/p resection, senile osteoporosis and recent problems with bone pain and pleural effusions related to her malignance was seen for an acute visit due to continued decline.  Her family is present including her husband, April [name], her son and her daughter April [name].  She is finally resting comfortably with the oxycodone liquid.  She is quite sleepy at present, but feels more comfortable.  Family do not have new concerns and are happy with hospice care.  Breathing is not labored.    Review of Systems  Constitutional: Positive for weight loss and malaise/fatigue.  Respiratory: Negative for shortness of breath.   Cardiovascular: Negative for chest pain.  Gastrointestinal: Negative for abdominal pain.  Musculoskeletal: Positive for myalgias.       Thigh and back pain controlled now  Neurological: Positive for weakness and headaches.       Lethargy  Psychiatric/Behavioral: Positive for depression.       Seems more at peace with family here    Past Medical History  Diagnosis Date  . History of breast cancer onocologist--  dr April [name]--  no recurrence    dx 2011-- DCIS, Right breast,  ER/PR positive --- s/p lumpectomy  No radiation,  took Aromasen 2012  . Senile osteoporosis     Took Fosamax x15years  . GERD (gastroesophageal reflux disease)   . Sigmoid diverticulosis   . Wears hearing aid     bilateral  . PMB (postmenopausal bleeding)   . Colon cancer [facility]) oncologist--  dr April [name]    dx jan 2016  --  Ascending colon carcinoma,  Stage IIIB,  pT3 N1a M0,  Grade 2,  MSI,  1 of 15 nodes +//   s/p  right colectomy  . Hypereosinophilic syndrome   . Chronic diarrhea    Past Surgical History  Procedure Laterality Date  . Cataract extraction w/ intraocular lens  implant, bilateral Bilateral [id number]  . Tonsillectomy and adenoidectomy  1930  . Colonoscopy N/A 01/28/2014    Procedure: COLONOSCOPY;  Surgeon: April Sabins, MD;  Location: [facility];  Service: Endoscopy;  Laterality: N/A;  . Esophagogastroduodenoscopy N/A 01/28/2014    Procedure: ESOPHAGOGASTRODUODENOSCOPY (EGD);  Surgeon: April Sabins, MD;  Location: [facility];  Service: Endoscopy;  Laterality: N/A;  . Laparoscopic partial right colectomy Right 01/30/2014    Procedure: LAPAROSCOPIC PARTIAL RIGHT COLECTOMY;  Surgeon: April Earls, MD;  Location: [facility];  Service: General;  Laterality: Right;  . Laparotomy N/A 01/31/2014    Procedure: EXPLORATORY LAPAROTOMY, RESECTION OF PRIOR SMALL BOWEL ANASTOMOSIS, UPPER ENDOSCOPY, CREATED A  NEW ILEO COLOSTOMY;  Surgeon: April Earls, MD;  Location: [facility];  Service: General;  Laterality: N/A;  . Laparoscopic cholecystectomy  05-26-2009  .  Breast lumpectomy Right 12-24-2009  . Transthoracic echocardiogram  04-18-2014    mild LVH/  ef 40-98%/  grade I diastolic dysfunction/  mild LAE/  trivial MR and TR  . Dilatation & currettage/hysteroscopy with resectocope N/A 05/16/2014    Procedure: DILATATION & CURETTAGE/HYSTEROSCOPY WITH RESECTOCOPE;  Surgeon: April Nigh, MD;  Location: [place];  Service: Gynecology;  Laterality: N/A;  . Laparoscopy N/A 05/16/2014    Procedure: LAPAROSCOPY DIAGNOSTIC;   Surgeon: April Nigh, MD;  Location: [facility];  Service: Gynecology;  Laterality: N/A;    Allergies  Allergen Reactions  . Ativan [Lorazepam] Other (See Comments)    confusion  . Codeine Nausea And Vomiting  . Sulfa Antibiotics Other (See Comments)    Unknown childhood reaction      Medication List       This list is accurate as of: 12/04/14 11:59 PM.  Always use your most recent med list.               ALPRAZolam 0.25 MG tablet  Commonly known as:  XANAX  Take 0.25 mg by mouth 2 (two) times daily as needed for anxiety.     Calcium Carbonate-Vitamin D 600-400 MG-UNIT tablet  Take 1 tablet by mouth daily.     CENTRUM SILVER ADULT 50+ PO  Take 1 tablet by mouth daily.     cholecalciferol 1000 UNITS tablet  Commonly known as:  VITAMIN D  Take 4,000 Units by mouth at bedtime.     cyclobenzaprine 5 MG tablet  Commonly known as:  FLEXERIL  Take 1 tablet (5 mg total) by mouth 3 (three) times daily as needed (tension headache).     FUSION PO  Take 1 tablet by mouth daily.     ipratropium-albuterol 0.5-2.5 (3) MG/3ML Soln  Commonly known as:  DUONEB  Take 3 mLs by nebulization every 6 (six) hours as needed. And q 6 prn sob or cough     mirtazapine 15 MG tablet  Commonly known as:  REMERON  Take 1 tablet (15 mg total) by mouth at bedtime.     OVER THE COUNTER MEDICATION  Take 1 tablet by mouth at bedtime. Macular supplement     oxyCODONE 20 MG/ML concentrated solution  Commonly known as:  ROXICODONE INTENSOL  Take 5 mg by mouth every 6 (six) hours as needed for severe pain. And every 2 hours as needed for pain/dyspnea     potassium chloride SA 20 MEQ tablet  Commonly known as:  K-DUR,KLOR-CON     saccharomyces boulardii 250 MG capsule  Commonly known as:  FLORASTOR  Take 250 mg by mouth 2 (two) times daily.     sertraline 50 MG tablet  Commonly known as:  ZOLOFT  Take 1 tablet (50 mg total) by mouth daily.        Immunization History    Administered Date(s) Administered  . Influenza,inj,Quad PF,36+ Mos 10/10/2014  . Influenza-Unspecified 10/26/2013  . Pneumococcal-Unspecified 01/27/2003  . Td 07/26/2002, 01/26/2014  . Zoster 09/26/2005   Pertinent  Health Maintenance Due  Topic Date Due  . PNA vac Low Risk Adult (2 of 2 - PCV13) 01/27/2004  . MAMMOGRAM  12/09/2014  . INFLUENZA VACCINE  08/27/2015  . DEXA SCAN  Completed   Fall Risk  05/23/2014 04/25/2014  Falls in the past year? No No    Filed Vitals:   12/04/14 1151  BP: 115/60  Pulse: 84  Height: 5' 5"  (1.651 m)  Weight: 126 lb (  57.153 kg)   Body mass index is 20.97 kg/(m^2). Physical Exam  Constitutional: No distress.  Cardiovascular: Normal rate, regular rhythm and normal heart sounds.   Pulmonary/Chest: Effort normal and breath sounds normal.  Abdominal: Soft. Bowel sounds are normal.  Neurological:  Lethargic but able to speak and respond to questions  Skin: Skin is warm and dry.  Psychiatric:  Pleasant lady    Labs reviewed:  Recent Labs  01/25/14 0643  02/07/14 0900 02/08/14 0515 02/09/14 0526  10/22/14 1424 10/26/14 0825 10/31/14 1309 11/15/14  NA 140  < > 136 140 137  < > 139 141 140 138  K 4.1  < > 3.3* 3.8 3.6  < > 3.3* 4.1 4.0 3.2*  CL 112  < > 105 105 105  --   --   --   --   --   CO2 23  < > 25 26 26   < > 25 21* 25  --   GLUCOSE 117*  < > 104* 88 91  < > 142* 127 132  --   BUN 13  < > 10 9 9   < > 12.1 13.2 20.1 10  CREATININE 0.63  < > 0.60 0.68 0.66  < > 0.8 0.8 0.8 0.6  CALCIUM 7.8*  < > 8.1* 8.5 8.3*  < > 9.1 9.5 9.4  --   MG 1.8  --  1.8 2.2  --   --   --   --   --   --   PHOS 3.2  --   --   --   --   --   --   --   --   --   < > = values in this interval not displayed.  Recent Labs  10/22/14 1424 10/26/14 0825 10/31/14 1309  AST 19 20 14   ALT 22 26 17   ALKPHOS 88 79 64  BILITOT 0.68 0.65 0.68  PROT 6.7 6.4 6.4  ALBUMIN 3.9 3.6 3.8    Recent Labs  10/31/14 1308 11/07/14 0937 11/12/14 1413 11/15/14  11/19/14 1320  WBC 44.8* 14.0* 48.6* 68.7 151.7*  NEUTROABS 40.1* 11.3* 34.8*  --   --   HGB 10.8* 8.8* 8.9* 7.4* 7.1*  HCT 32.6* 27.2* 27.8* 21* 22.7*  MCV 82.3 84.0 83.9  --  85.6  PLT 76 Large & giant platelets* 37* 53* 35* 33*   Lab Results  Component Value Date   TSH 4.055 01/25/2014   No results found for: HGBA1C Lab Results  Component Value Date   CHOL 58 11/15/2014   HDL 5* 11/15/2014   LDLCALC 27 11/15/2014   TRIG 128 11/15/2014    Significant Diagnostic Results in last 30 days:  Ct Chest W Contrast  11/12/2014  CLINICAL DATA:  Colon cancer diagnosed January 2016, status post resection, for restaging. History of breast cancer diagnosed 2011 status post right lumpectomy. Prior cholecystectomy. Persistent dry cough x1 week. EXAM: CT CHEST, ABDOMEN, AND PELVIS WITH CONTRAST TECHNIQUE: Multidetector CT imaging of the chest, abdomen and pelvis was performed following the standard protocol during bolus administration of intravenous contrast. CONTRAST:  42m OMNIPAQUE IOHEXOL 300 MG/ML  SOLN COMPARISON:  CT chest dated 03/16/2014. CT abdomen pelvis dated 01/25/2015. FINDINGS: CT CHEST FINDINGS Mediastinum/Nodes: The heart is normal in size. No pericardial effusion. Atherosclerotic calcifications of the aortic arch. Ectasia of the ascending thoracic aorta, measuring 3.4 cm. 8 mm short axis high right paratracheal node (series 2/ image 15), within normal limits. No suspicious mediastinal, hilar,  or axillary lymphadenopathy. Visualized thyroid is unremarkable. Lungs/Pleura: Evaluation of lung parenchyma (particularly the upper lobes) is constrained by respiratory motion. No suspicious pulmonary nodules. Mild lingular opacity, likely atelectasis. Mild dependent/compressive atelectasis in the bilateral lower lobes. Small bilateral pleural effusions. No pneumothorax. Musculoskeletal: Status post lumpectomy with postsurgical changes in the lower outer right breast (series 2/images 32-33).  Degenerative changes of the thoracic spine. CT ABDOMEN PELVIS FINDINGS Motion degraded images. Hepatobiliary: Liver is within normal limits. No suspicious/enhancing hepatic lesions. Status post cholecystectomy. No intrahepatic or extrahepatic ductal dilatation. Pancreas: Within normal limits. Spleen: Splenomegaly, measuring 17.4 cm in maximal craniocaudal dimension, new. Adrenals/Urinary Tract: Adrenal glands are within normal limits. Kidneys are within normal limits.  No hydronephrosis. Bladder is unremarkable. Stomach/Bowel: Stomach is within normal limits. Status post ileocecal resection with anastomosis in the right mid abdomen (series 2/image 86). Appendix is surgically absent. Sigmoid diverticulosis, without evidence of diverticulitis. Vascular/Lymphatic: No evidence of abdominal aortic aneurysm. No suspicious abdominopelvic lymphadenopathy. Reproductive: Uterus is within normal limits. Bilateral ovaries are within normal limits. Other: No abdominopelvic ascites. Musculoskeletal: Mild inferior endplate changes at L1, chronic. Moderate superior endplate compression fracture deformity at L4 (sagittal image 58), with approximately 40% loss of height, new. IMPRESSION: Motion degraded images. Status post ileocecal resection. No evidence of recurrent or metastatic disease. Splenomegaly, new. Small bilateral pleural effusions, new. Moderate compression fracture deformity at L4, new. Additional stable ancillary findings as above. Electronically Signed   By: Julian Hy M.D.   On: 11/12/2014 15:20   Ct Abdomen Pelvis W Contrast  11/12/2014  CLINICAL DATA:  Colon cancer diagnosed January 2016, status post resection, for restaging. History of breast cancer diagnosed 2011 status post right lumpectomy. Prior cholecystectomy. Persistent dry cough x1 week. EXAM: CT CHEST, ABDOMEN, AND PELVIS WITH CONTRAST TECHNIQUE: Multidetector CT imaging of the chest, abdomen and pelvis was performed following the standard  protocol during bolus administration of intravenous contrast. CONTRAST:  47m OMNIPAQUE IOHEXOL 300 MG/ML  SOLN COMPARISON:  CT chest dated 03/16/2014. CT abdomen pelvis dated 01/25/2015. FINDINGS: CT CHEST FINDINGS Mediastinum/Nodes: The heart is normal in size. No pericardial effusion. Atherosclerotic calcifications of the aortic arch. Ectasia of the ascending thoracic aorta, measuring 3.4 cm. 8 mm short axis high right paratracheal node (series 2/ image 15), within normal limits. No suspicious mediastinal, hilar, or axillary lymphadenopathy. Visualized thyroid is unremarkable. Lungs/Pleura: Evaluation of lung parenchyma (particularly the upper lobes) is constrained by respiratory motion. No suspicious pulmonary nodules. Mild lingular opacity, likely atelectasis. Mild dependent/compressive atelectasis in the bilateral lower lobes. Small bilateral pleural effusions. No pneumothorax. Musculoskeletal: Status post lumpectomy with postsurgical changes in the lower outer right breast (series 2/images 32-33). Degenerative changes of the thoracic spine. CT ABDOMEN PELVIS FINDINGS Motion degraded images. Hepatobiliary: Liver is within normal limits. No suspicious/enhancing hepatic lesions. Status post cholecystectomy. No intrahepatic or extrahepatic ductal dilatation. Pancreas: Within normal limits. Spleen: Splenomegaly, measuring 17.4 cm in maximal craniocaudal dimension, new. Adrenals/Urinary Tract: Adrenal glands are within normal limits. Kidneys are within normal limits.  No hydronephrosis. Bladder is unremarkable. Stomach/Bowel: Stomach is within normal limits. Status post ileocecal resection with anastomosis in the right mid abdomen (series 2/image 86). Appendix is surgically absent. Sigmoid diverticulosis, without evidence of diverticulitis. Vascular/Lymphatic: No evidence of abdominal aortic aneurysm. No suspicious abdominopelvic lymphadenopathy. Reproductive: Uterus is within normal limits. Bilateral ovaries are  within normal limits. Other: No abdominopelvic ascites. Musculoskeletal: Mild inferior endplate changes at L1, chronic. Moderate superior endplate compression fracture deformity at L4 (sagittal image 58), with  approximately 40% loss of height, new. IMPRESSION: Motion degraded images. Status post ileocecal resection. No evidence of recurrent or metastatic disease. Splenomegaly, new. Small bilateral pleural effusions, new. Moderate compression fracture deformity at L4, new. Additional stable ancillary findings as above. Electronically Signed   By: Julian Hy M.D.   On: 11/12/2014 15:20    Assessment/Plan 1. Myeloproliferative neoplasm ([place]) -pt now comfortable, receiving hospice services -family feels supported  2. Malignant bone pain -much better now this am -appears comfortable. No changes at this time  3. FTT (failure to thrive) in adult -pt now actively dying it appears -intake has been poor for several weeks, losing weight, depressed, aware her disease is terminal  4. Pleural effusion, bilateral -dyspnea is controlled now, as well  5. Active advance directive - documented in epic - DNR (Do Not Resuscitate)  Family/ staff Communication: discussed with pt's husband, son, daughter, rehab nurse and Midwife  Una Yeomans L. Gaberial Cada, D.O. [facility] [address], [place] [address] Cell Phone (Mon-Fri 8am-5pm):  [phone] On Call:  [phone] & follow prompts after 5pm & weekends Office Phone:  [phone] Office Fax:  [phone]

## 2014-12-09 ENCOUNTER — Encounter: Payer: Self-pay | Admitting: Internal Medicine

## 2014-12-10 ENCOUNTER — Other Ambulatory Visit: Payer: Self-pay | Admitting: *Deleted

## 2014-12-10 MED ORDER — AMBULATORY NON FORMULARY MEDICATION: Status: AC

## 2014-12-25 ENCOUNTER — Telehealth: Payer: Self-pay

## 2014-12-25 NOTE — Telephone Encounter (Signed)
April [name], a Network engineer from [employer] called for diagnosis code on pt.

## 2014-12-27 NOTE — Telephone Encounter (Signed)
[facility] 

## 2014-12-27 DEATH — deceased

## 2016-06-30 ENCOUNTER — Encounter: Payer: Self-pay | Admitting: Genetic Counselor

## 2016-07-02 ENCOUNTER — Telehealth: Payer: Self-pay | Admitting: Genetic Counselor

## 2016-07-02 ENCOUNTER — Encounter: Payer: Self-pay | Admitting: Genetic Counselor

## 2016-07-02 NOTE — Telephone Encounter (Signed)
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  We will call her in the future to let her know about that update.

## 2016-10-24 IMAGING — XA IR ANGIO/VISCERAL SELECTIVE EA VESSEL WO/W FLUSH
7 series · 12 of 24 positions shown · IV contrast (IODINE)
Comparison: none

CLINICAL DATA: 88-year-old female with the lower GI bleed. CT of
the abdomen and pelvis with contrast on 01/24/2014 demonstrated
findings suggestive of extravasation of contrast material in the
ascending colon. Patient was treated conservatively and did well
until developing recurrent GI bleeding last night. A tagged nuclear
medicine red blood cell study was positive for bleeding in the mid
ascending colon. The patient now presents to Interventional
Radiology for catheter angiography and possible embolization if the
bleeding source is identified.
TECHNIQUE: Informed consent was obtained from the patient following explanation
of the procedure, risks, benefits and alternatives. The patient
understands, agrees and consents for the procedure. All questions
were addressed. A time out was performed.

[Series 2: care body 4 · 1 of 75 frames shown (1 of 6)]
[frame 38/75]
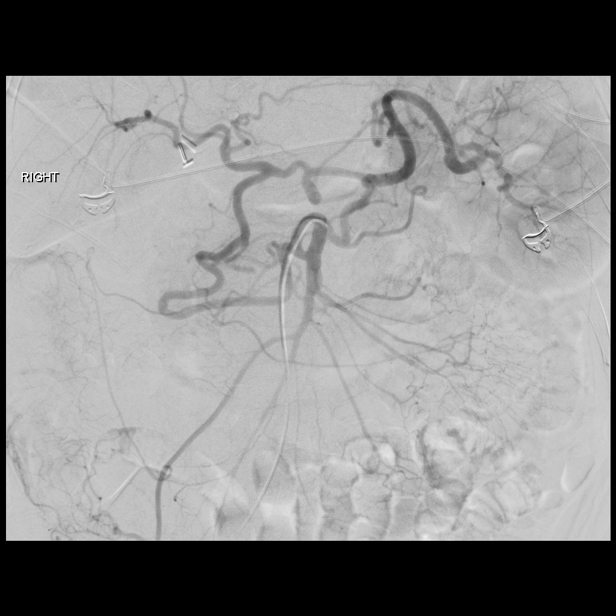

[Series 3: care body 4 · 2 of 89 frames shown (2 of 6)]
[frame 14/89]
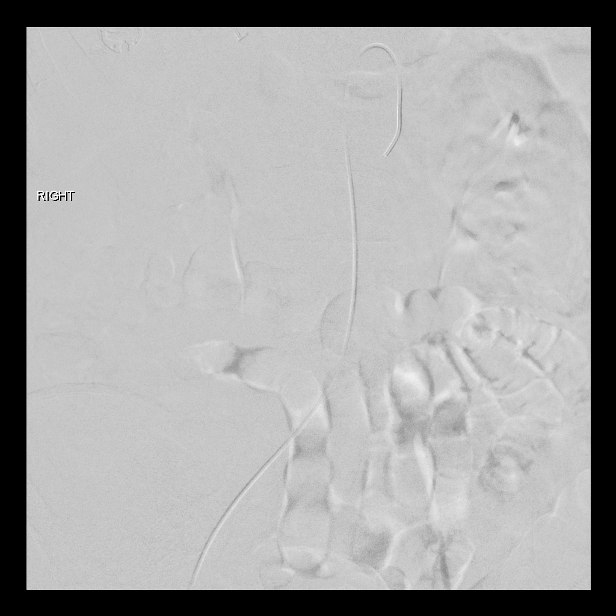
[frame 76/89]
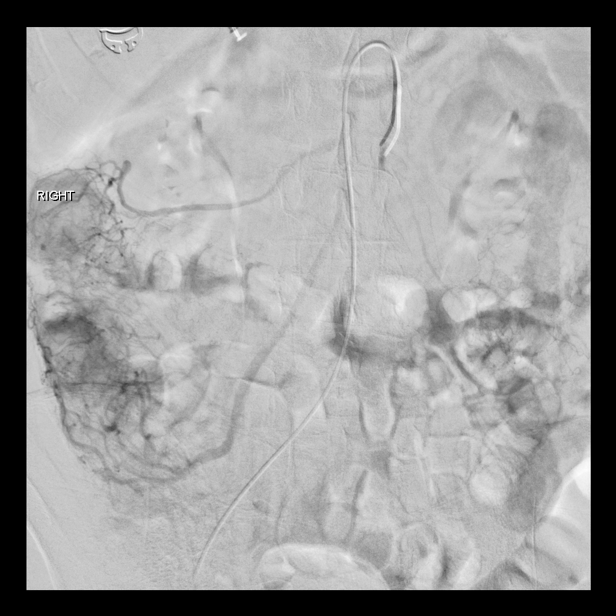

[Series 4: care body 4 · 1 of 85 frames shown (3 of 6)]
[frame 43/85]
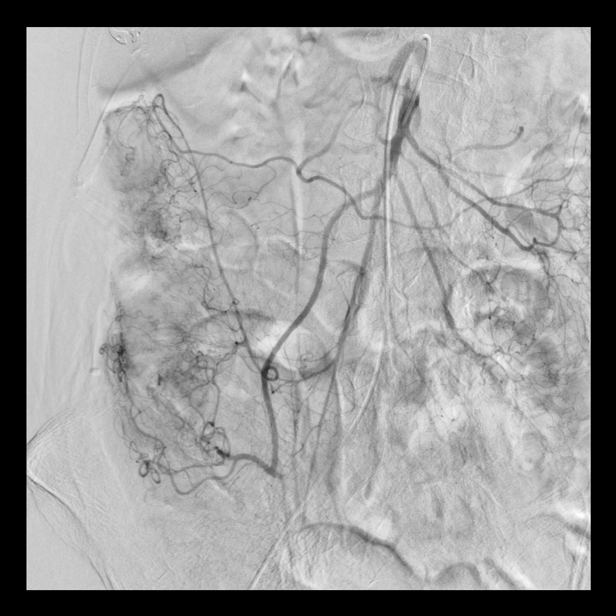

[Series 5: care body 4 · 2 of 60 frames shown (4 of 6)]
[frame 10/60]
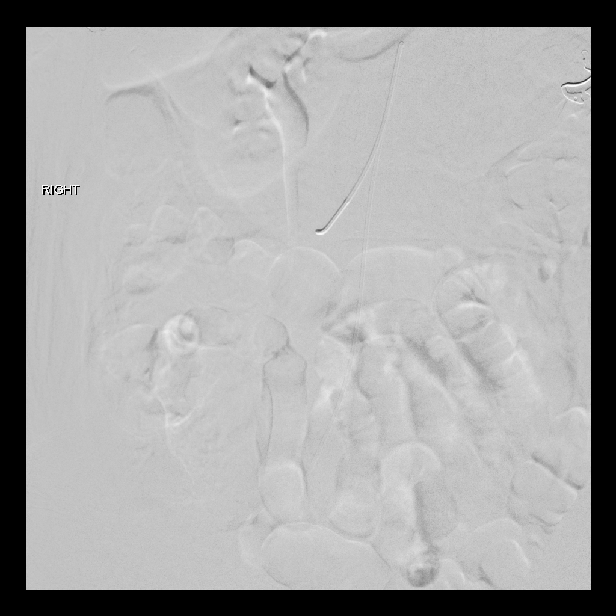
[frame 38/60]
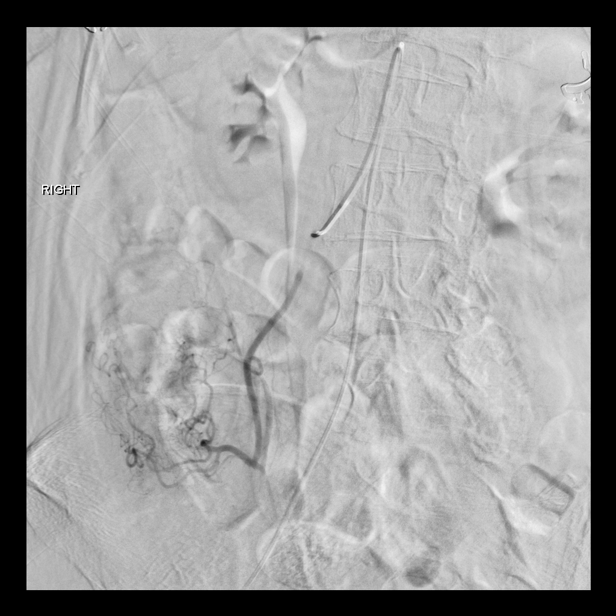

[Series 6: care body 4 · 1 of 94 frames shown (5 of 6)]
[frame 48/94]
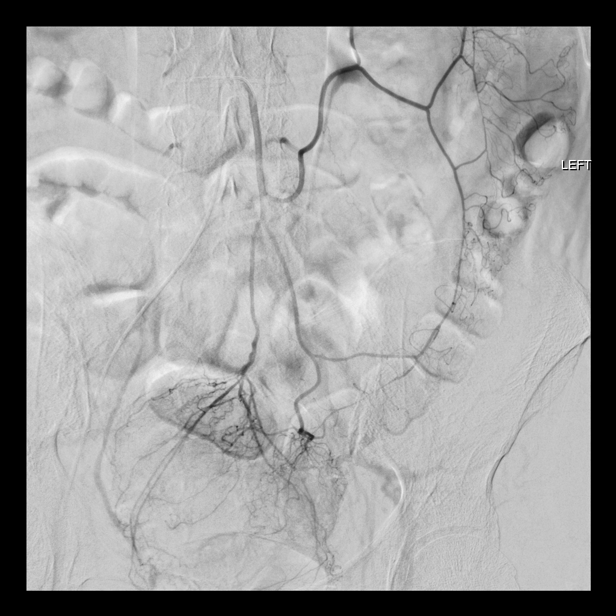

[Series 7: care body 4 · 2 of 58 frames shown (6 of 6)]
[frame 9/58]
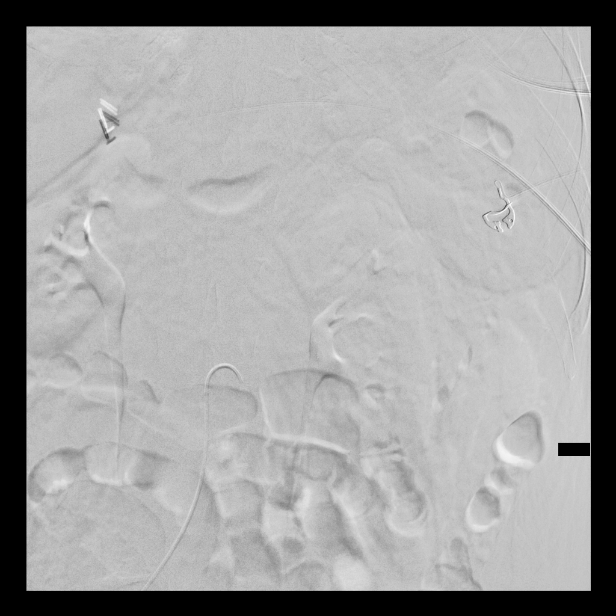
[frame 45/58]
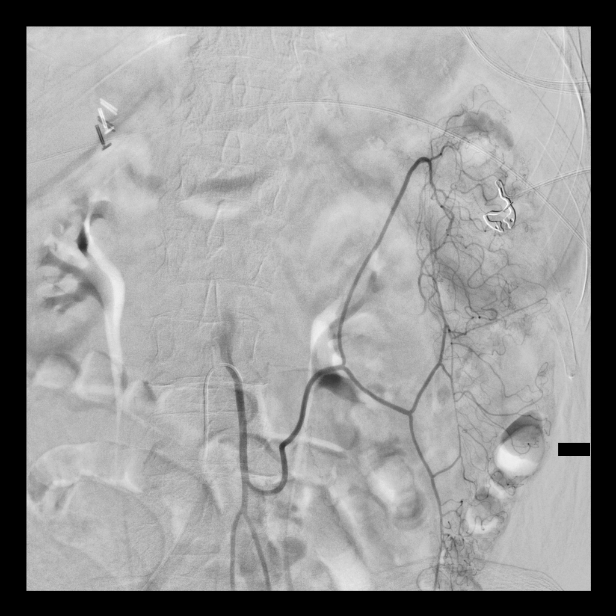

[Series 300: ld dsa body · 3 of 7 slices shown]
[im 2/7]
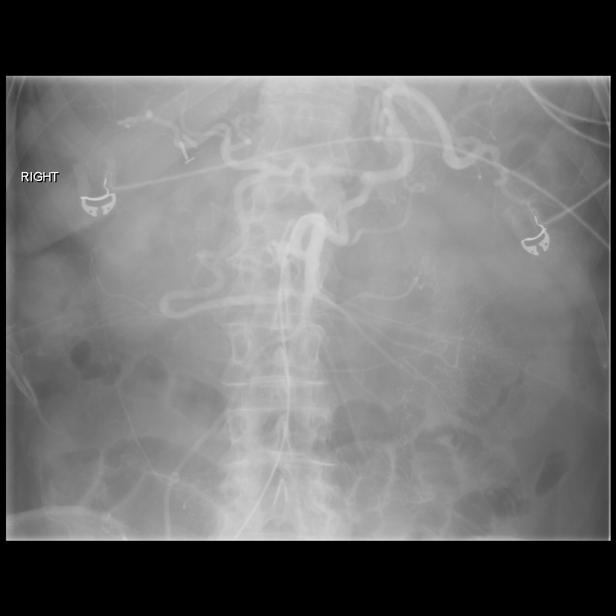
[im 4/7]
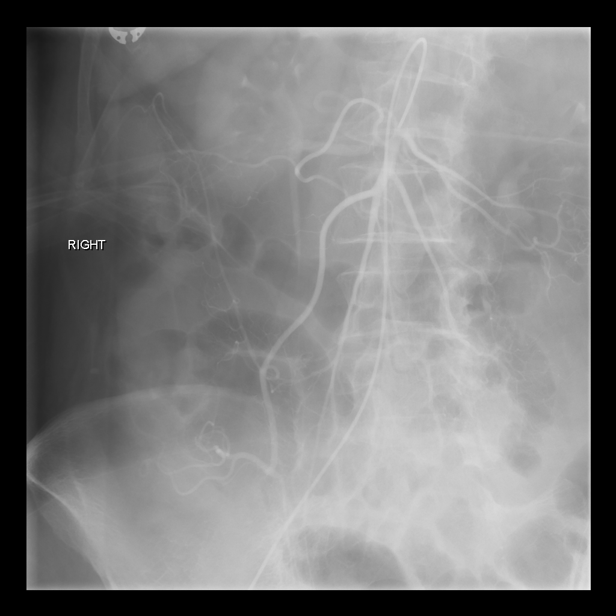
[im 7/7]
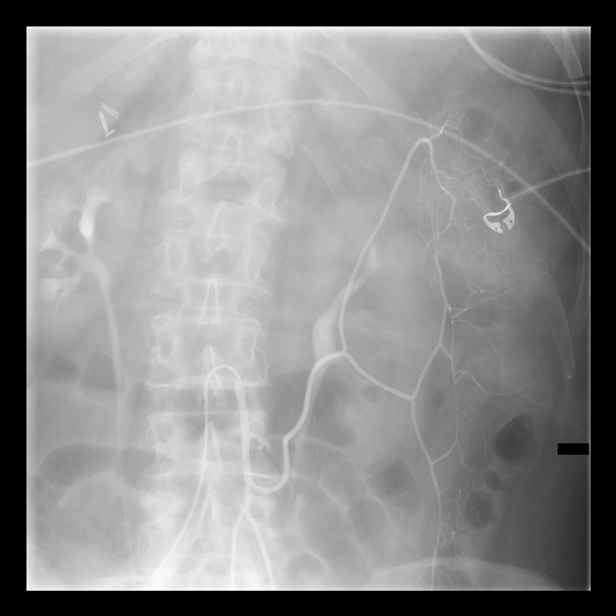

[12 of 24 positions shown; findings below may reference images not displayed]

EXAM:
SELECTIVE VISCERAL ARTERIOGRAPHY; ADDITIONAL ARTERIOGRAPHY; IR
ULTRASOUND GUIDANCE VASC ACCESS RIGHT

Date: 01/25/2014

PROCEDURE:
1. Ultrasound-guided puncture of the right common femoral artery
2. Catheterization of the superior mesenteric artery with
arteriogram
3. Catheterization of the ileal colloid artery with arteriogram
4. Catheterization of the inferior mesenteric artery with
arteriogram
5. Limited right common femoral arteriogram
6. Arterial closure with Cordis ExoSeal

ANESTHESIA/SEDATION:
Moderate (conscious) sedation was used. 4.5 mg Versed, 125 mcg
Fentanyl were administered intravenously. The patient's vital signs
were monitored continuously by radiology nursing throughout the
procedure.

Sedation Time: 34 minutes

MEDICATIONS:
2 g Ancef administered intravenously

FLUOROSCOPY TIME:  4 min 18 seconds 391 mGy

CONTRAST:  130mL OMNIPAQUE IOHEXOL 300 MG/ML  SOLN
Maximal barrier sterile technique utilized including caps, mask,
sterile gowns, sterile gloves, large sterile drape, hand hygiene,
and Betadine skin prep.

The right groin was interrogated with ultrasound. The patient has a
high common femoral bifurcation. An image was obtained and stored
for the medical record. Local anesthesia was attained by
infiltration with 1% lidocaine. A small dermatotomy was made. Under
real-time sonographic guidance, the common femoral artery was
punctured just above the bifurcation. The 3 French inner portion of
the a micropuncture sheath was advanced over the micro wire and a
right common femoral arteriogram was performed confirming that the
arterial puncture was below the inferior genu of the inferior
epigastric artery and therefore below the inguinal ligament.

The micro sheath was then used to exchange the micro wire for a
0.035 Bentson wire. A C2 cobra catheter was advanced into the
abdominal aorta an used to select the superior mesenteric artery. A
superior mesenteric arteriogram was performed. There is a marked
hypertrophy of the inferior pancreaticoduodenal arcade as well as a
direct communication between the inferior pancreaticoduodenal arcade
and the splenic artery consistent with an arc of Liane. There is
retrograde filling of the branches of the celiac artery. There is a
high-grade stenosis versus occlusion at the origin of the celiac
artery.

A repeat arteriography was performed to include the inferior
branches. There is no definite evidence of active extravasation.
There is some increased density in the lateral margin of the
ascending colon in the region of the recent bleeding. Therefore, the
C2 catheter was advanced over a glidewire and positioned in the
common trunk of the ileocolic and right gastric arteries. Repeat
arteriography was performed in multiple obliquities which
demonstrated in the density to represent overlapping of vessels.
Despite multiple angiographic runs, no evidence of bleeding or
irregularity was identified.

The C2 catheter was removed over a wire and a RIM catheter was
introduced. The rim was used to select the inferior mesenteric
artery and inferior mesenteric arteriogram was performed. No
evidence of bleeding or vascular abnormality.

Hemostasis stress then the catheter was removed. Hemostasis was
obtained with the assistance of a Cordis ExoSeal extra arterial
collagen plug.

COMPLICATIONS:
None
IMPRESSION: 1. Mesenteric arteriography is a negative for evidence of active
bleeding or focal vascular abnormality that would suggest a source
for bleeding.
2. High-grade stenosis versus occlusion of the celiac artery.
3. Anatomic Arc of Liane (direct communication between the
mesenteric and celiac arteries) and hyper trophic inferior
pancreaticoduodenal arcade provide retrograde flow from the SMA to
the celiac branch arteries.

PLAN:
If the patient again developed significant acute lower GI bleeding
resulting in hemodynamic changes (tachycardia or hypotension) repeat
arteriography may be considered in an effort to identify and
embolize the intermittent bleeding source.

In the setting of persistent intermittent low volume bleeding, the
without hemodynamic changes, repeat arteriography is unlikely to be
of benefit.
